# Patient Record
Sex: Male | Born: 1950
Health system: Southern US, Community
[De-identification: ages and names within clinical notes are randomized; demographics above are authoritative.]

## PROBLEM LIST (undated history)

## (undated) DIAGNOSIS — L8990 Pressure ulcer of unspecified site, unspecified stage: Secondary | ICD-10-CM

## (undated) DIAGNOSIS — E785 Hyperlipidemia, unspecified: Secondary | ICD-10-CM

## (undated) DIAGNOSIS — G8220 Paraplegia, unspecified: Secondary | ICD-10-CM

## (undated) DIAGNOSIS — T7840XA Allergy, unspecified, initial encounter: Secondary | ICD-10-CM

## (undated) DIAGNOSIS — L02215 Cutaneous abscess of perineum: Secondary | ICD-10-CM

## (undated) DIAGNOSIS — F1910 Other psychoactive substance abuse, uncomplicated: Secondary | ICD-10-CM

## (undated) DIAGNOSIS — K219 Gastro-esophageal reflux disease without esophagitis: Secondary | ICD-10-CM

## (undated) DIAGNOSIS — I10 Essential (primary) hypertension: Secondary | ICD-10-CM

## (undated) DIAGNOSIS — A0472 Enterocolitis due to Clostridium difficile, not specified as recurrent: Secondary | ICD-10-CM

## (undated) DIAGNOSIS — E1310 Other specified diabetes mellitus with ketoacidosis without coma: Secondary | ICD-10-CM

## (undated) DIAGNOSIS — M1990 Unspecified osteoarthritis, unspecified site: Secondary | ICD-10-CM

## (undated) HISTORY — PX: CATARACT EXTRACTION: SUR2

## (undated) HISTORY — DX: Pressure ulcer of unspecified site, unspecified stage: L89.90

## (undated) HISTORY — DX: Essential (primary) hypertension: I10

## (undated) HISTORY — DX: Enterocolitis due to Clostridium difficile, not specified as recurrent: A04.72

## (undated) HISTORY — PX: SPINE SURGERY: SHX786

## (undated) HISTORY — DX: Hyperlipidemia, unspecified: E78.5

## (undated) HISTORY — DX: Gastro-esophageal reflux disease without esophagitis: K21.9

## (undated) HISTORY — DX: Cutaneous abscess of perineum: L02.215

## (undated) HISTORY — DX: Paraplegia, unspecified: G82.20

## (undated) HISTORY — DX: Allergy, unspecified, initial encounter: T78.40XA

## (undated) HISTORY — PX: THROAT SURGERY: SHX803

## (undated) HISTORY — DX: Other psychoactive substance abuse, uncomplicated: F19.10

---
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
## 1997-12-09 DIAGNOSIS — L8990 Pressure ulcer of unspecified site, unspecified stage: Secondary | ICD-10-CM

## 1997-12-09 HISTORY — DX: Pressure ulcer of unspecified site, unspecified stage: L89.90

## 1998-03-15 ENCOUNTER — Emergency Department (HOSPITAL_COMMUNITY): Admission: EM | Admit: 1998-03-15 | Discharge: 1998-03-15 | Payer: Self-pay | Admitting: Emergency Medicine

## 1998-03-28 ENCOUNTER — Inpatient Hospital Stay (HOSPITAL_COMMUNITY): Admission: AD | Admit: 1998-03-28 | Discharge: 1998-04-04 | Payer: Self-pay | Admitting: Hematology and Oncology

## 1998-04-17 ENCOUNTER — Encounter: Admission: RE | Admit: 1998-04-17 | Discharge: 1998-04-17 | Payer: Self-pay | Admitting: Internal Medicine

## 1998-05-05 ENCOUNTER — Inpatient Hospital Stay (HOSPITAL_COMMUNITY): Admission: RE | Admit: 1998-05-05 | Discharge: 1998-05-26 | Payer: Self-pay | Admitting: Plastic Surgery

## 1998-06-19 ENCOUNTER — Encounter (HOSPITAL_COMMUNITY): Admission: RE | Admit: 1998-06-19 | Discharge: 1998-09-17 | Payer: Self-pay | Admitting: Plastic Surgery

## 1998-07-13 ENCOUNTER — Encounter: Admission: RE | Admit: 1998-07-13 | Discharge: 1998-07-13 | Payer: Self-pay | Admitting: Hematology and Oncology

## 1998-12-14 ENCOUNTER — Encounter: Admission: RE | Admit: 1998-12-14 | Discharge: 1998-12-14 | Payer: Self-pay | Admitting: Hematology and Oncology

## 1999-01-09 ENCOUNTER — Ambulatory Visit (HOSPITAL_COMMUNITY): Admission: RE | Admit: 1999-01-09 | Discharge: 1999-01-09 | Payer: Self-pay

## 1999-03-06 ENCOUNTER — Encounter: Admission: RE | Admit: 1999-03-06 | Discharge: 1999-03-06 | Payer: Self-pay | Admitting: Hematology and Oncology

## 1999-04-10 ENCOUNTER — Encounter: Admission: RE | Admit: 1999-04-10 | Discharge: 1999-04-10 | Payer: Self-pay | Admitting: Internal Medicine

## 1999-07-09 ENCOUNTER — Encounter: Admission: RE | Admit: 1999-07-09 | Discharge: 1999-07-09 | Payer: Self-pay | Admitting: Internal Medicine

## 1999-07-13 ENCOUNTER — Ambulatory Visit (HOSPITAL_COMMUNITY): Admission: RE | Admit: 1999-07-13 | Discharge: 1999-07-13 | Payer: Self-pay

## 2000-01-08 ENCOUNTER — Encounter: Admission: RE | Admit: 2000-01-08 | Discharge: 2000-01-08 | Payer: Self-pay | Admitting: Internal Medicine

## 2000-02-25 ENCOUNTER — Encounter: Admission: RE | Admit: 2000-02-25 | Discharge: 2000-02-25 | Payer: Self-pay | Admitting: Internal Medicine

## 2000-03-03 ENCOUNTER — Encounter: Payer: Self-pay | Admitting: Internal Medicine

## 2000-03-03 ENCOUNTER — Ambulatory Visit (HOSPITAL_COMMUNITY): Admission: RE | Admit: 2000-03-03 | Discharge: 2000-03-03 | Payer: Self-pay | Admitting: Internal Medicine

## 2000-03-03 ENCOUNTER — Encounter: Admission: RE | Admit: 2000-03-03 | Discharge: 2000-03-03 | Payer: Self-pay | Admitting: Internal Medicine

## 2000-04-14 ENCOUNTER — Encounter: Admission: RE | Admit: 2000-04-14 | Discharge: 2000-04-14 | Payer: Self-pay | Admitting: Internal Medicine

## 2000-06-30 ENCOUNTER — Encounter: Admission: RE | Admit: 2000-06-30 | Discharge: 2000-06-30 | Payer: Self-pay | Admitting: Internal Medicine

## 2000-12-09 HISTORY — PX: OTHER SURGICAL HISTORY: SHX169

## 2000-12-15 ENCOUNTER — Encounter: Admission: RE | Admit: 2000-12-15 | Discharge: 2000-12-15 | Payer: Self-pay

## 2001-01-05 ENCOUNTER — Encounter: Admission: RE | Admit: 2001-01-05 | Discharge: 2001-01-05 | Payer: Self-pay

## 2001-03-26 ENCOUNTER — Encounter: Admission: RE | Admit: 2001-03-26 | Discharge: 2001-03-26 | Payer: Self-pay

## 2001-05-05 ENCOUNTER — Encounter: Admission: RE | Admit: 2001-05-05 | Discharge: 2001-05-05 | Payer: Self-pay

## 2001-05-06 ENCOUNTER — Ambulatory Visit (HOSPITAL_COMMUNITY): Admission: RE | Admit: 2001-05-06 | Discharge: 2001-05-06 | Payer: Self-pay

## 2001-05-13 ENCOUNTER — Encounter: Admission: RE | Admit: 2001-05-13 | Discharge: 2001-05-13 | Payer: Self-pay | Admitting: Internal Medicine

## 2001-05-18 ENCOUNTER — Encounter: Admission: RE | Admit: 2001-05-18 | Discharge: 2001-05-18 | Payer: Self-pay | Admitting: Internal Medicine

## 2001-06-08 ENCOUNTER — Encounter: Admission: RE | Admit: 2001-06-08 | Discharge: 2001-06-08 | Payer: Self-pay | Admitting: Internal Medicine

## 2001-07-01 ENCOUNTER — Ambulatory Visit (HOSPITAL_COMMUNITY): Admission: RE | Admit: 2001-07-01 | Discharge: 2001-07-01 | Payer: Self-pay | Admitting: Plastic Surgery

## 2001-07-13 ENCOUNTER — Encounter: Admission: RE | Admit: 2001-07-13 | Discharge: 2001-07-13 | Payer: Self-pay | Admitting: Internal Medicine

## 2001-07-29 ENCOUNTER — Encounter: Payer: Self-pay | Admitting: Plastic Surgery

## 2001-07-31 ENCOUNTER — Encounter (INDEPENDENT_AMBULATORY_CARE_PROVIDER_SITE_OTHER): Payer: Self-pay | Admitting: *Deleted

## 2001-07-31 ENCOUNTER — Inpatient Hospital Stay (HOSPITAL_COMMUNITY): Admission: RE | Admit: 2001-07-31 | Discharge: 2001-08-21 | Payer: Self-pay | Admitting: Infectious Diseases

## 2001-09-03 ENCOUNTER — Encounter: Admission: RE | Admit: 2001-09-03 | Discharge: 2001-09-03 | Payer: Self-pay | Admitting: Internal Medicine

## 2001-12-21 ENCOUNTER — Encounter: Admission: RE | Admit: 2001-12-21 | Discharge: 2001-12-21 | Payer: Self-pay | Admitting: Internal Medicine

## 2001-12-29 ENCOUNTER — Encounter: Admission: RE | Admit: 2001-12-29 | Discharge: 2001-12-29 | Payer: Self-pay

## 2002-08-11 ENCOUNTER — Encounter: Admission: RE | Admit: 2002-08-11 | Discharge: 2002-08-11 | Payer: Self-pay | Admitting: Internal Medicine

## 2002-11-16 ENCOUNTER — Encounter: Admission: RE | Admit: 2002-11-16 | Discharge: 2002-11-16 | Payer: Self-pay | Admitting: Internal Medicine

## 2002-12-01 ENCOUNTER — Ambulatory Visit (HOSPITAL_COMMUNITY): Admission: RE | Admit: 2002-12-01 | Discharge: 2002-12-01 | Payer: Self-pay | Admitting: Internal Medicine

## 2002-12-01 ENCOUNTER — Encounter: Payer: Self-pay | Admitting: Cardiology

## 2002-12-21 ENCOUNTER — Encounter: Admission: RE | Admit: 2002-12-21 | Discharge: 2002-12-21 | Payer: Self-pay | Admitting: Internal Medicine

## 2003-01-27 ENCOUNTER — Encounter: Admission: RE | Admit: 2003-01-27 | Discharge: 2003-01-27 | Payer: Self-pay | Admitting: Internal Medicine

## 2003-01-31 ENCOUNTER — Encounter: Payer: Self-pay | Admitting: Internal Medicine

## 2003-01-31 ENCOUNTER — Ambulatory Visit (HOSPITAL_COMMUNITY): Admission: RE | Admit: 2003-01-31 | Discharge: 2003-01-31 | Payer: Self-pay | Admitting: Internal Medicine

## 2003-07-21 ENCOUNTER — Encounter: Admission: RE | Admit: 2003-07-21 | Discharge: 2003-07-21 | Payer: Self-pay | Admitting: Internal Medicine

## 2003-08-03 ENCOUNTER — Encounter: Admission: RE | Admit: 2003-08-03 | Discharge: 2003-08-03 | Payer: Self-pay | Admitting: Internal Medicine

## 2004-01-09 ENCOUNTER — Encounter: Admission: RE | Admit: 2004-01-09 | Discharge: 2004-01-09 | Payer: Self-pay | Admitting: Internal Medicine

## 2004-02-09 ENCOUNTER — Encounter: Admission: RE | Admit: 2004-02-09 | Discharge: 2004-02-09 | Payer: Self-pay | Admitting: Internal Medicine

## 2004-09-05 ENCOUNTER — Ambulatory Visit: Payer: Self-pay | Admitting: Internal Medicine

## 2004-10-14 ENCOUNTER — Emergency Department (HOSPITAL_COMMUNITY): Admission: EM | Admit: 2004-10-14 | Discharge: 2004-10-14 | Payer: Self-pay | Admitting: Emergency Medicine

## 2004-10-15 ENCOUNTER — Ambulatory Visit: Payer: Self-pay | Admitting: Internal Medicine

## 2004-11-27 ENCOUNTER — Ambulatory Visit: Payer: Self-pay | Admitting: Internal Medicine

## 2005-04-01 ENCOUNTER — Ambulatory Visit: Payer: Self-pay | Admitting: Internal Medicine

## 2005-09-10 ENCOUNTER — Ambulatory Visit: Payer: Self-pay | Admitting: Hospitalist

## 2005-10-01 ENCOUNTER — Ambulatory Visit: Payer: Self-pay | Admitting: Internal Medicine

## 2005-10-07 ENCOUNTER — Encounter
Admission: RE | Admit: 2005-10-07 | Discharge: 2006-01-05 | Payer: Self-pay | Admitting: Physical Medicine & Rehabilitation

## 2005-10-07 ENCOUNTER — Ambulatory Visit: Payer: Self-pay | Admitting: Physical Medicine & Rehabilitation

## 2005-10-29 ENCOUNTER — Ambulatory Visit: Payer: Self-pay | Admitting: Internal Medicine

## 2005-11-03 ENCOUNTER — Ambulatory Visit (HOSPITAL_COMMUNITY): Admission: RE | Admit: 2005-11-03 | Discharge: 2005-11-03 | Payer: Self-pay | Admitting: Internal Medicine

## 2005-11-11 ENCOUNTER — Encounter: Admission: RE | Admit: 2005-11-11 | Discharge: 2006-02-09 | Payer: Self-pay | Admitting: Internal Medicine

## 2005-11-13 ENCOUNTER — Ambulatory Visit: Payer: Self-pay | Admitting: Internal Medicine

## 2006-01-07 ENCOUNTER — Ambulatory Visit: Payer: Self-pay | Admitting: Hospitalist

## 2006-01-07 ENCOUNTER — Ambulatory Visit (HOSPITAL_COMMUNITY): Admission: RE | Admit: 2006-01-07 | Discharge: 2006-01-07 | Payer: Self-pay | Admitting: Hospitalist

## 2006-02-28 ENCOUNTER — Ambulatory Visit: Payer: Self-pay | Admitting: Physical Medicine & Rehabilitation

## 2006-02-28 ENCOUNTER — Inpatient Hospital Stay (HOSPITAL_COMMUNITY): Admission: RE | Admit: 2006-02-28 | Discharge: 2006-03-09 | Payer: Self-pay | Admitting: Neurosurgery

## 2006-03-01 ENCOUNTER — Ambulatory Visit: Payer: Self-pay | Admitting: Critical Care Medicine

## 2006-10-15 ENCOUNTER — Emergency Department (HOSPITAL_COMMUNITY): Admission: EM | Admit: 2006-10-15 | Discharge: 2006-10-15 | Payer: Self-pay | Admitting: Emergency Medicine

## 2006-12-29 ENCOUNTER — Ambulatory Visit: Payer: Self-pay | Admitting: Internal Medicine

## 2006-12-29 DIAGNOSIS — G8220 Paraplegia, unspecified: Secondary | ICD-10-CM

## 2006-12-29 DIAGNOSIS — I10 Essential (primary) hypertension: Secondary | ICD-10-CM

## 2006-12-29 DIAGNOSIS — E785 Hyperlipidemia, unspecified: Secondary | ICD-10-CM

## 2006-12-29 DIAGNOSIS — K219 Gastro-esophageal reflux disease without esophagitis: Secondary | ICD-10-CM | POA: Insufficient documentation

## 2006-12-29 DIAGNOSIS — J309 Allergic rhinitis, unspecified: Secondary | ICD-10-CM | POA: Insufficient documentation

## 2006-12-29 DIAGNOSIS — G4700 Insomnia, unspecified: Secondary | ICD-10-CM

## 2007-01-08 ENCOUNTER — Encounter: Payer: Self-pay | Admitting: Internal Medicine

## 2007-01-08 ENCOUNTER — Ambulatory Visit: Payer: Self-pay | Admitting: Internal Medicine

## 2007-02-16 LAB — CONVERTED CEMR LAB
Albumin: 4.1 g/dL (ref 3.5–5.2)
BUN: 17 mg/dL (ref 6–23)
Calcium: 9.3 mg/dL (ref 8.4–10.5)
Chloride: 99 meq/L (ref 96–112)
Creatinine, Ser: 0.84 mg/dL (ref 0.40–1.50)
Glucose, Bld: 92 mg/dL (ref 70–99)
HCT: 40.1 % (ref 39.0–52.0)
HDL: 24 mg/dL — ABNORMAL LOW (ref >39)
Hemoglobin: 12.5 g/dL — ABNORMAL LOW (ref 13.0–17.0)
MCHC: 31.2 g/dL (ref 30.0–36.0)
Potassium: 5.3 meq/L (ref 3.5–5.3)
RBC: 4.6 M/uL (ref 4.22–5.81)
Total CHOL/HDL Ratio: 5.9
Triglycerides: 242 mg/dL — ABNORMAL HIGH (ref <150)

## 2007-06-02 ENCOUNTER — Ambulatory Visit: Payer: Self-pay | Admitting: Internal Medicine

## 2007-06-02 DIAGNOSIS — K644 Residual hemorrhoidal skin tags: Secondary | ICD-10-CM | POA: Insufficient documentation

## 2007-06-17 ENCOUNTER — Ambulatory Visit: Payer: Self-pay | Admitting: Gastroenterology

## 2007-07-02 ENCOUNTER — Inpatient Hospital Stay (HOSPITAL_COMMUNITY): Admission: AD | Admit: 2007-07-02 | Discharge: 2007-07-03 | Payer: Self-pay | Admitting: Obstetrics & Gynecology

## 2007-07-03 ENCOUNTER — Encounter: Payer: Self-pay | Admitting: Gastroenterology

## 2007-07-10 ENCOUNTER — Ambulatory Visit: Payer: Self-pay | Admitting: Gastroenterology

## 2007-08-13 ENCOUNTER — Telehealth: Payer: Self-pay | Admitting: Internal Medicine

## 2007-11-08 ENCOUNTER — Inpatient Hospital Stay (HOSPITAL_COMMUNITY): Admission: EM | Admit: 2007-11-08 | Discharge: 2007-11-16 | Payer: Self-pay | Admitting: *Deleted

## 2007-11-08 ENCOUNTER — Ambulatory Visit: Payer: Self-pay | Admitting: Internal Medicine

## 2007-11-09 HISTORY — PX: BONE BIOPSY: SHX375

## 2007-12-01 ENCOUNTER — Ambulatory Visit: Payer: Self-pay | Admitting: Internal Medicine

## 2007-12-01 ENCOUNTER — Encounter (INDEPENDENT_AMBULATORY_CARE_PROVIDER_SITE_OTHER): Payer: Self-pay | Admitting: Infectious Diseases

## 2007-12-01 LAB — CONVERTED CEMR LAB
BUN: 15 mg/dL (ref 6–23)
CO2: 21 meq/L (ref 19–32)
Calcium: 9.2 mg/dL (ref 8.4–10.5)
Creatinine, Ser: 0.74 mg/dL (ref 0.40–1.50)
Glucose, Bld: 107 mg/dL — ABNORMAL HIGH (ref 70–99)
Hemoglobin, Urine: NEGATIVE
Ketones, ur: NEGATIVE mg/dL
Nitrite: NEGATIVE
Protein, ur: NEGATIVE mg/dL
Urobilinogen, UA: 0.2 (ref 0.0–1.0)

## 2007-12-13 ENCOUNTER — Ambulatory Visit: Payer: Self-pay | Admitting: Hospitalist

## 2007-12-13 ENCOUNTER — Inpatient Hospital Stay (HOSPITAL_COMMUNITY): Admission: EM | Admit: 2007-12-13 | Discharge: 2007-12-18 | Payer: Self-pay | Admitting: Emergency Medicine

## 2007-12-18 ENCOUNTER — Encounter: Payer: Self-pay | Admitting: Internal Medicine

## 2007-12-18 ENCOUNTER — Ambulatory Visit: Payer: Self-pay | Admitting: Internal Medicine

## 2008-01-19 ENCOUNTER — Ambulatory Visit: Payer: Self-pay | Admitting: Hospitalist

## 2008-01-25 ENCOUNTER — Telehealth: Payer: Self-pay | Admitting: Internal Medicine

## 2008-01-27 ENCOUNTER — Ambulatory Visit: Payer: Self-pay | Admitting: Internal Medicine

## 2008-01-28 ENCOUNTER — Encounter: Payer: Self-pay | Admitting: Internal Medicine

## 2008-02-03 ENCOUNTER — Telehealth: Payer: Self-pay | Admitting: Internal Medicine

## 2008-02-04 ENCOUNTER — Encounter: Payer: Self-pay | Admitting: Internal Medicine

## 2008-02-11 ENCOUNTER — Encounter: Payer: Self-pay | Admitting: Internal Medicine

## 2008-03-01 ENCOUNTER — Telehealth: Payer: Self-pay | Admitting: Internal Medicine

## 2008-03-10 ENCOUNTER — Telehealth (INDEPENDENT_AMBULATORY_CARE_PROVIDER_SITE_OTHER): Payer: Self-pay | Admitting: *Deleted

## 2008-03-11 ENCOUNTER — Ambulatory Visit: Payer: Self-pay | Admitting: Hospitalist

## 2008-03-11 ENCOUNTER — Encounter: Payer: Self-pay | Admitting: Internal Medicine

## 2008-03-11 LAB — CONVERTED CEMR LAB
ALT: <8 units/L (ref 0–53)
AST: 7 units/L (ref 0–37)
Basophils Absolute: 0 10*3/uL (ref 0.0–0.1)
Basophils Relative: 0 % (ref 0–1)
Calcium: 9.5 mg/dL (ref 8.4–10.5)
Chloride: 102 meq/L (ref 96–112)
Creatinine, Ser: 0.72 mg/dL (ref 0.40–1.50)
Hemoglobin: 12 g/dL — ABNORMAL LOW (ref 13.0–17.0)
MCHC: 34.1 g/dL (ref 30.0–36.0)
Monocytes Absolute: 0.8 10*3/uL (ref 0.1–1.0)
Neutro Abs: 7.3 10*3/uL (ref 1.7–7.7)
Neutrophils Relative %: 75 % (ref 43–77)
Platelets: 455 10*3/uL — ABNORMAL HIGH (ref 150–400)
RDW: 16.8 % — ABNORMAL HIGH (ref 11.5–15.5)
Total Bilirubin: 0.3 mg/dL (ref 0.3–1.2)

## 2008-04-08 ENCOUNTER — Telehealth: Payer: Self-pay | Admitting: Internal Medicine

## 2008-04-14 ENCOUNTER — Telehealth: Payer: Self-pay | Admitting: Internal Medicine

## 2008-04-18 ENCOUNTER — Ambulatory Visit: Payer: Self-pay | Admitting: *Deleted

## 2008-04-18 ENCOUNTER — Encounter (INDEPENDENT_AMBULATORY_CARE_PROVIDER_SITE_OTHER): Payer: Self-pay | Admitting: Internal Medicine

## 2008-04-19 ENCOUNTER — Telehealth: Payer: Self-pay | Admitting: Internal Medicine

## 2008-04-19 LAB — CONVERTED CEMR LAB
Basophils Absolute: 0 10*3/uL (ref 0.0–0.1)
Basophils Relative: 0 % (ref 0–1)
Calcium: 9.2 mg/dL (ref 8.4–10.5)
Eosinophils Absolute: 0.1 10*3/uL (ref 0.0–0.7)
Eosinophils Relative: 1 % (ref 0–5)
HCT: 31.3 % — ABNORMAL LOW (ref 39.0–52.0)
MCV: 83.4 fL (ref 78.0–100.0)
Nitrite: POSITIVE — AB
Platelets: 573 10*3/uL — ABNORMAL HIGH (ref 150–400)
RDW: 15.8 % — ABNORMAL HIGH (ref 11.5–15.5)
Sodium: 134 meq/L — ABNORMAL LOW (ref 135–145)
Specific Gravity, Urine: 1.015 (ref 1.005–1.03)
pH: 5.5 (ref 5.0–8.0)

## 2008-04-22 ENCOUNTER — Ambulatory Visit: Payer: Self-pay | Admitting: Internal Medicine

## 2008-04-22 ENCOUNTER — Encounter (INDEPENDENT_AMBULATORY_CARE_PROVIDER_SITE_OTHER): Payer: Self-pay | Admitting: Internal Medicine

## 2008-04-24 LAB — CONVERTED CEMR LAB
Basophils Absolute: 0 10*3/uL (ref 0.0–0.1)
Basophils Relative: 0 % (ref 0–1)
Eosinophils Absolute: 0.1 10*3/uL (ref 0.0–0.7)
Hemoglobin: 10.6 g/dL — ABNORMAL LOW (ref 13.0–17.0)
MCHC: 32.9 g/dL (ref 30.0–36.0)
Monocytes Absolute: 1.2 10*3/uL — ABNORMAL HIGH (ref 0.1–1.0)
Neutro Abs: 9.9 10*3/uL — ABNORMAL HIGH (ref 1.7–7.7)
Neutrophils Relative %: 76 % (ref 43–77)
RDW: 16 % — ABNORMAL HIGH (ref 11.5–15.5)

## 2008-06-17 ENCOUNTER — Encounter: Payer: Self-pay | Admitting: Internal Medicine

## 2008-06-24 ENCOUNTER — Ambulatory Visit: Payer: Self-pay | Admitting: *Deleted

## 2008-07-13 ENCOUNTER — Telehealth: Payer: Self-pay | Admitting: Internal Medicine

## 2008-07-16 ENCOUNTER — Encounter: Payer: Self-pay | Admitting: Internal Medicine

## 2008-07-27 ENCOUNTER — Ambulatory Visit: Payer: Self-pay | Admitting: Internal Medicine

## 2008-07-27 ENCOUNTER — Encounter: Payer: Self-pay | Admitting: Internal Medicine

## 2008-07-27 LAB — CONVERTED CEMR LAB
ALT: <8 units/L (ref 0–53)
AST: 7 units/L (ref 0–37)
Albumin: 3.1 g/dL — ABNORMAL LOW (ref 3.5–5.2)
Basophils Absolute: 0 10*3/uL (ref 0.0–0.1)
CO2: 23 meq/L (ref 19–32)
Calcium: 8.7 mg/dL (ref 8.4–10.5)
Chloride: 103 meq/L (ref 96–112)
Cholesterol: 109 mg/dL (ref 0–200)
Hemoglobin: 9.6 g/dL — ABNORMAL LOW (ref 13.0–17.0)
Lymphocytes Relative: 18 % (ref 12–46)
Monocytes Absolute: 0.7 10*3/uL (ref 0.1–1.0)
Neutro Abs: 6 10*3/uL (ref 1.7–7.7)
Neutrophils Relative %: 72 % (ref 43–77)
Nitrite: POSITIVE — AB
PSA: 0.61 ng/mL (ref 0.10–4.00)
Potassium: 4.3 meq/L (ref 3.5–5.3)
RBC / HPF: NONE SEEN (ref <3)
RDW: 17.1 % — ABNORMAL HIGH (ref 11.5–15.5)
Sodium: 137 meq/L (ref 135–145)
Total Protein: 6.1 g/dL (ref 6.0–8.3)
Urine Glucose: NEGATIVE mg/dL
pH: 6.5 (ref 5.0–8.0)

## 2008-07-28 ENCOUNTER — Encounter: Payer: Self-pay | Admitting: Internal Medicine

## 2008-08-17 ENCOUNTER — Encounter: Payer: Self-pay | Admitting: Internal Medicine

## 2008-08-23 ENCOUNTER — Telehealth: Payer: Self-pay | Admitting: *Deleted

## 2008-08-29 ENCOUNTER — Encounter: Payer: Self-pay | Admitting: Internal Medicine

## 2008-08-29 ENCOUNTER — Telehealth: Payer: Self-pay | Admitting: Internal Medicine

## 2008-09-26 ENCOUNTER — Ambulatory Visit: Payer: Self-pay | Admitting: Internal Medicine

## 2008-09-26 DIAGNOSIS — M7150 Other bursitis, not elsewhere classified, unspecified site: Secondary | ICD-10-CM

## 2008-10-04 ENCOUNTER — Encounter: Payer: Self-pay | Admitting: Internal Medicine

## 2008-10-18 ENCOUNTER — Ambulatory Visit: Payer: Self-pay | Admitting: Sports Medicine

## 2008-10-31 ENCOUNTER — Inpatient Hospital Stay (HOSPITAL_COMMUNITY): Admission: RE | Admit: 2008-10-31 | Discharge: 2008-11-21 | Payer: Self-pay | Admitting: Plastic Surgery

## 2008-10-31 ENCOUNTER — Ambulatory Visit: Payer: Self-pay | Admitting: Internal Medicine

## 2008-10-31 ENCOUNTER — Encounter (INDEPENDENT_AMBULATORY_CARE_PROVIDER_SITE_OTHER): Payer: Self-pay | Admitting: Plastic Surgery

## 2008-10-31 ENCOUNTER — Encounter: Payer: Self-pay | Admitting: Internal Medicine

## 2008-10-31 ENCOUNTER — Other Ambulatory Visit: Payer: Self-pay | Admitting: Internal Medicine

## 2008-11-08 DIAGNOSIS — A0472 Enterocolitis due to Clostridium difficile, not specified as recurrent: Secondary | ICD-10-CM

## 2008-11-08 HISTORY — DX: Enterocolitis due to Clostridium difficile, not specified as recurrent: A04.72

## 2008-11-22 ENCOUNTER — Encounter: Payer: Self-pay | Admitting: Internal Medicine

## 2008-11-27 ENCOUNTER — Encounter (INDEPENDENT_AMBULATORY_CARE_PROVIDER_SITE_OTHER): Payer: Self-pay | Admitting: *Deleted

## 2008-12-03 ENCOUNTER — Telehealth (INDEPENDENT_AMBULATORY_CARE_PROVIDER_SITE_OTHER): Payer: Self-pay | Admitting: Internal Medicine

## 2008-12-04 ENCOUNTER — Inpatient Hospital Stay (HOSPITAL_COMMUNITY): Admission: EM | Admit: 2008-12-04 | Discharge: 2008-12-07 | Payer: Self-pay | Admitting: Emergency Medicine

## 2008-12-07 ENCOUNTER — Encounter: Payer: Self-pay | Admitting: Internal Medicine

## 2008-12-12 ENCOUNTER — Telehealth: Payer: Self-pay | Admitting: *Deleted

## 2008-12-12 ENCOUNTER — Encounter: Payer: Self-pay | Admitting: Internal Medicine

## 2008-12-12 ENCOUNTER — Ambulatory Visit: Payer: Self-pay | Admitting: Internal Medicine

## 2008-12-12 DIAGNOSIS — D649 Anemia, unspecified: Secondary | ICD-10-CM

## 2008-12-12 LAB — CONVERTED CEMR LAB
BUN: 10 mg/dL (ref 6–23)
Chloride: 105 meq/L (ref 96–112)
Creatinine, Ser: 0.45 mg/dL (ref 0.40–1.50)
HCT: 35.1 % — ABNORMAL LOW (ref 39.0–52.0)
Hemoglobin: 11.7 g/dL — ABNORMAL LOW (ref 13.0–17.0)
MCHC: 33.4 g/dL (ref 30.0–36.0)
Magnesium: 1.9 mg/dL (ref 1.5–2.5)
Platelets: 370 10*3/uL (ref 150–400)
Potassium: 4 meq/L (ref 3.5–5.3)
RDW: 24.2 % — ABNORMAL HIGH (ref 11.5–15.5)

## 2008-12-21 ENCOUNTER — Telehealth: Payer: Self-pay | Admitting: Infectious Disease

## 2008-12-22 ENCOUNTER — Ambulatory Visit: Payer: Self-pay | Admitting: Internal Medicine

## 2008-12-22 ENCOUNTER — Inpatient Hospital Stay (HOSPITAL_COMMUNITY): Admission: AD | Admit: 2008-12-22 | Discharge: 2008-12-25 | Payer: Self-pay | Admitting: Internal Medicine

## 2008-12-22 ENCOUNTER — Encounter (INDEPENDENT_AMBULATORY_CARE_PROVIDER_SITE_OTHER): Payer: Self-pay | Admitting: Internal Medicine

## 2008-12-22 LAB — CONVERTED CEMR LAB
Albumin: 3 g/dL — ABNORMAL LOW (ref 3.5–5.2)
Alkaline Phosphatase: 74 units/L (ref 39–117)
BUN: 3 mg/dL — ABNORMAL LOW (ref 6–23)
CO2: 23 meq/L (ref 19–32)
Calcium: 9.1 mg/dL (ref 8.4–10.5)
Chloride: 102 meq/L (ref 96–112)
Glucose, Bld: 128 mg/dL — ABNORMAL HIGH (ref 70–99)
Lymphocytes Relative: 5 % — ABNORMAL LOW (ref 12–46)
Lymphs Abs: 0.9 10*3/uL (ref 0.7–4.0)
MCV: 84.6 fL (ref 78.0–100.0)
Magnesium: 1.6 mg/dL (ref 1.5–2.5)
Monocytes Relative: 6 % (ref 3–12)
Neutrophils Relative %: 89 % — ABNORMAL HIGH (ref 43–77)
Platelets: 330 10*3/uL (ref 150–400)
Potassium: 3 meq/L — ABNORMAL LOW (ref 3.5–5.3)
RBC: 4.95 M/uL (ref 4.22–5.81)
Sodium: 140 meq/L (ref 135–145)
Total Protein: 6.2 g/dL (ref 6.0–8.3)
WBC: 20.7 10*3/uL — ABNORMAL HIGH (ref 4.0–10.5)

## 2008-12-23 ENCOUNTER — Ambulatory Visit: Payer: Self-pay | Admitting: Internal Medicine

## 2008-12-24 ENCOUNTER — Encounter (INDEPENDENT_AMBULATORY_CARE_PROVIDER_SITE_OTHER): Payer: Self-pay | Admitting: Internal Medicine

## 2008-12-26 ENCOUNTER — Encounter: Payer: Self-pay | Admitting: Internal Medicine

## 2009-01-06 ENCOUNTER — Encounter: Payer: Self-pay | Admitting: Internal Medicine

## 2009-01-12 ENCOUNTER — Ambulatory Visit: Payer: Self-pay | Admitting: Internal Medicine

## 2009-01-12 ENCOUNTER — Encounter (INDEPENDENT_AMBULATORY_CARE_PROVIDER_SITE_OTHER): Payer: Self-pay | Admitting: *Deleted

## 2009-01-12 LAB — CONVERTED CEMR LAB
Calcium: 9.3 mg/dL (ref 8.4–10.5)
Glucose, Bld: 95 mg/dL (ref 70–99)
Potassium: 4.3 meq/L (ref 3.5–5.3)
Sodium: 139 meq/L (ref 135–145)

## 2009-01-25 ENCOUNTER — Encounter: Payer: Self-pay | Admitting: Internal Medicine

## 2009-02-01 ENCOUNTER — Telehealth: Payer: Self-pay | Admitting: Internal Medicine

## 2009-02-08 ENCOUNTER — Encounter: Payer: Self-pay | Admitting: Internal Medicine

## 2009-02-13 ENCOUNTER — Ambulatory Visit: Payer: Self-pay | Admitting: *Deleted

## 2009-02-14 ENCOUNTER — Telehealth: Payer: Self-pay | Admitting: Internal Medicine

## 2009-02-17 ENCOUNTER — Ambulatory Visit: Payer: Self-pay | Admitting: Family Medicine

## 2009-02-17 DIAGNOSIS — M719 Bursopathy, unspecified: Secondary | ICD-10-CM

## 2009-02-17 DIAGNOSIS — M67919 Unspecified disorder of synovium and tendon, unspecified shoulder: Secondary | ICD-10-CM | POA: Insufficient documentation

## 2009-03-01 ENCOUNTER — Telehealth: Payer: Self-pay | Admitting: Internal Medicine

## 2009-03-14 ENCOUNTER — Telehealth: Payer: Self-pay | Admitting: Internal Medicine

## 2009-03-16 ENCOUNTER — Encounter: Payer: Self-pay | Admitting: Internal Medicine

## 2009-04-10 ENCOUNTER — Telehealth: Payer: Self-pay | Admitting: Internal Medicine

## 2009-04-12 ENCOUNTER — Telehealth: Payer: Self-pay | Admitting: Internal Medicine

## 2009-04-13 ENCOUNTER — Encounter: Payer: Self-pay | Admitting: Internal Medicine

## 2009-05-16 ENCOUNTER — Encounter (INDEPENDENT_AMBULATORY_CARE_PROVIDER_SITE_OTHER): Payer: Self-pay | Admitting: Internal Medicine

## 2009-05-16 ENCOUNTER — Telehealth: Payer: Self-pay | Admitting: Internal Medicine

## 2009-05-24 ENCOUNTER — Ambulatory Visit: Payer: Self-pay | Admitting: Internal Medicine

## 2009-07-03 ENCOUNTER — Telehealth: Payer: Self-pay | Admitting: Internal Medicine

## 2009-08-03 ENCOUNTER — Encounter: Payer: Self-pay | Admitting: Internal Medicine

## 2009-08-09 ENCOUNTER — Encounter: Payer: Self-pay | Admitting: Internal Medicine

## 2009-09-04 ENCOUNTER — Encounter: Payer: Self-pay | Admitting: Infectious Diseases

## 2009-09-04 ENCOUNTER — Telehealth (INDEPENDENT_AMBULATORY_CARE_PROVIDER_SITE_OTHER): Payer: Self-pay | Admitting: *Deleted

## 2009-09-25 IMAGING — CT CT ABDOMEN W/ CM
2 of 5 series · 11 of 32 positions shown, 16 images · IV contrast (100 ML OMNI 300)
Comparison: 11/10/2008; 11/07/2008

CT ABDOMEN

CLINICAL DATA: Revision flap over the shield decubitus ulcer.
Paraplegia.  Abnormal KUB with possible free air.

CT ABDOMEN AND PELVIS WITH CONTRAST
TECHNIQUE: Multidetector CT imaging of the abdomen and pelvis was
performed using the standard protocol following bolus
administration of intravenous contrast.
Contrast: 100 ml [id number]

[Series 2: routine abdomen · axial · 0.82mm/px · z∈[-418,-93]mm · 6 of 93 slices shown, 11 images]
[im 14/93  soft-tissue]
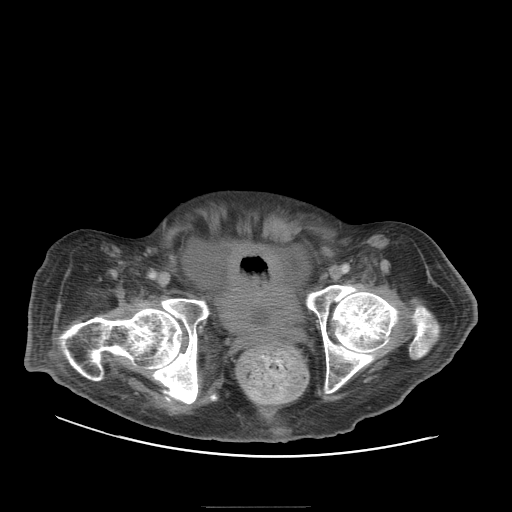
[im 14/93  bone]
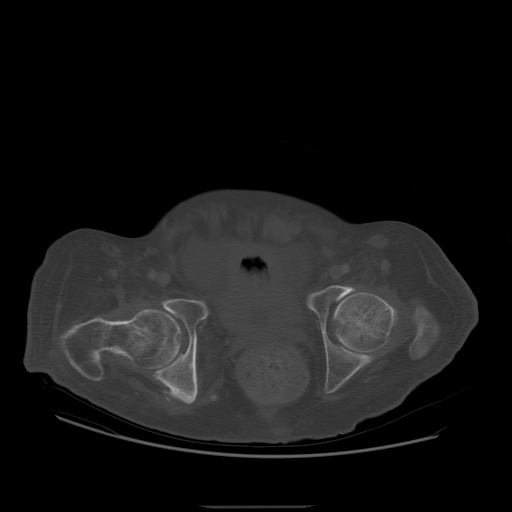
[im 27/93  soft-tissue]
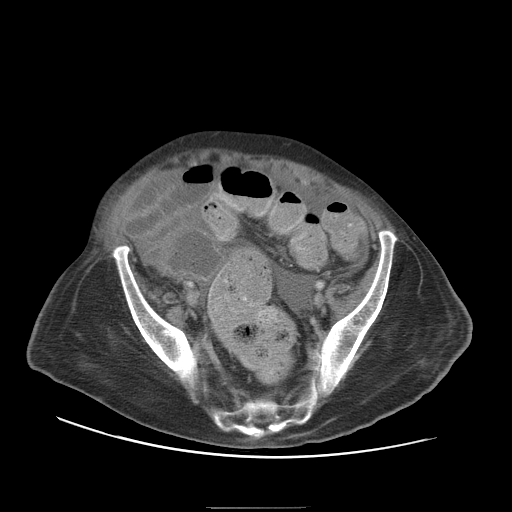
[im 40/93  soft-tissue]
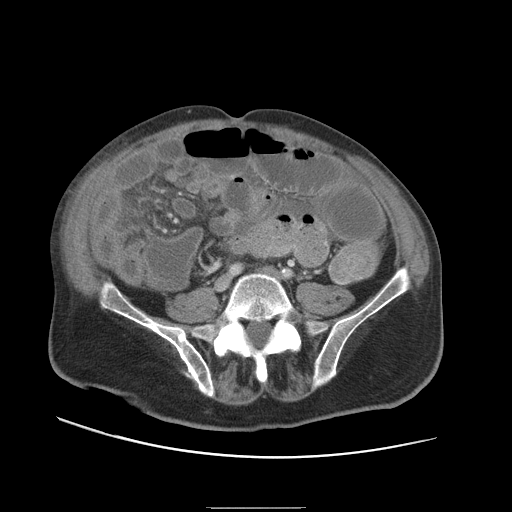
[im 40/93  lung]
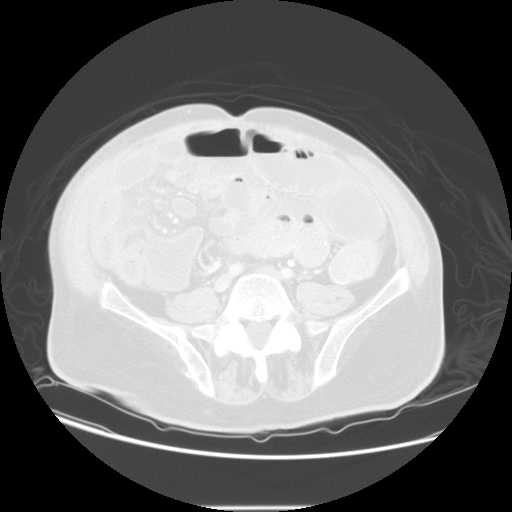
[im 53/93  soft-tissue]
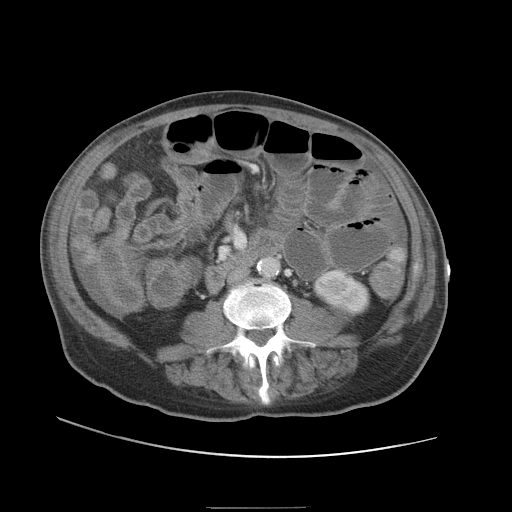
[im 53/93  lung]
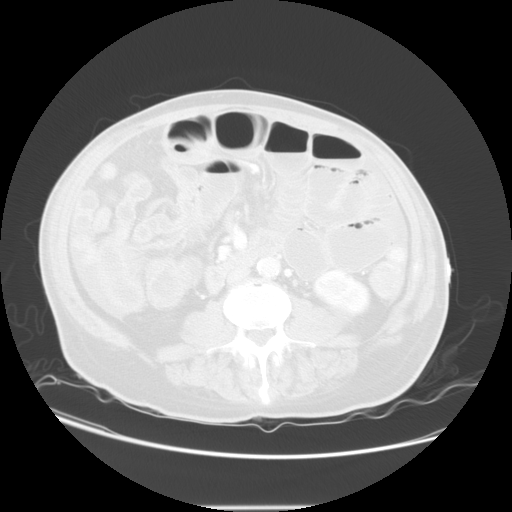
[im 66/93  soft-tissue]
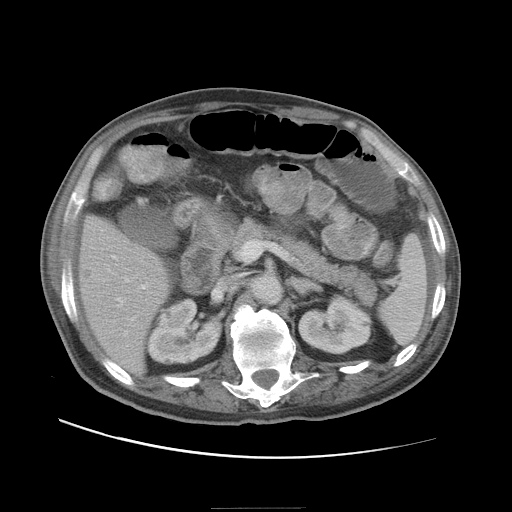
[im 66/93  lung]
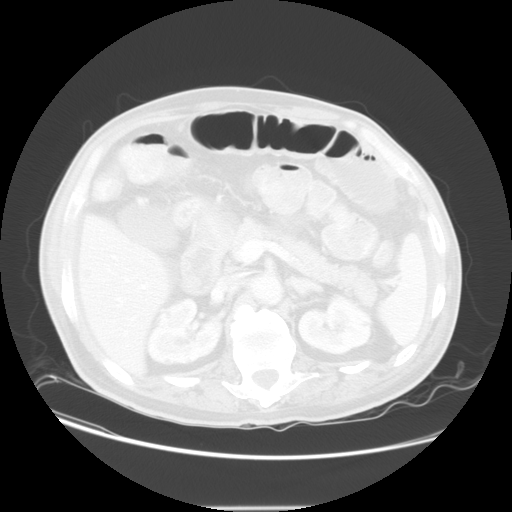
[im 79/93  soft-tissue]
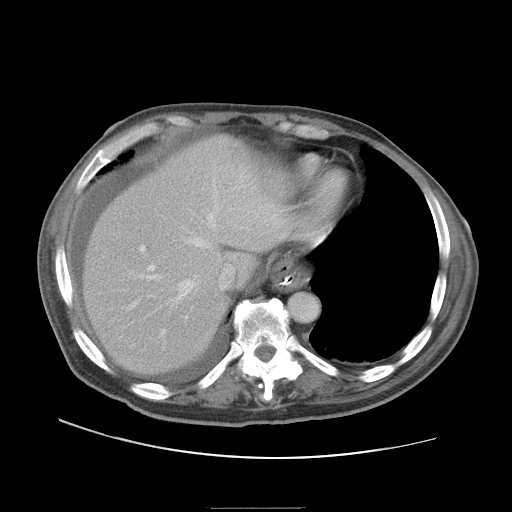
[im 79/93  lung]
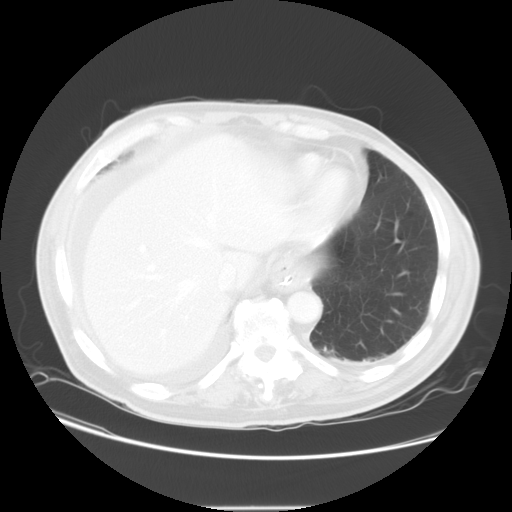

[Series 400: reformatted · sagittal · 0.92mm/px · 5 of 115 slices shown]
[im 12/115  soft-tissue]
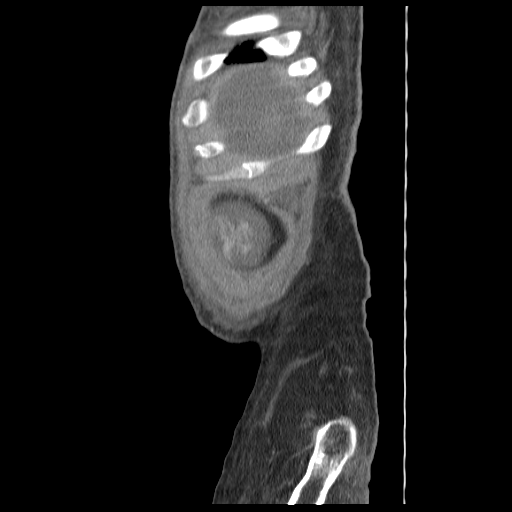
[im 23/115  soft-tissue]
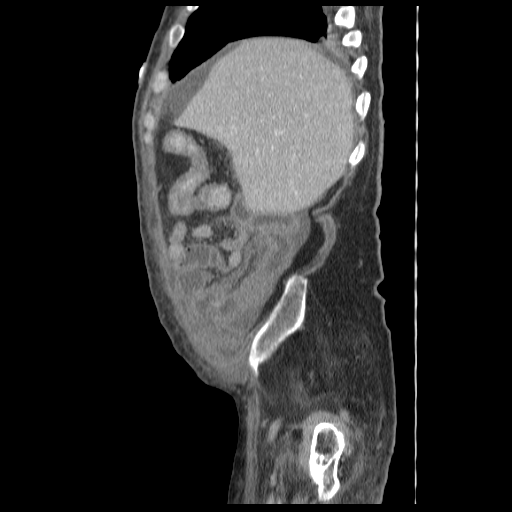
[im 35/115  soft-tissue]
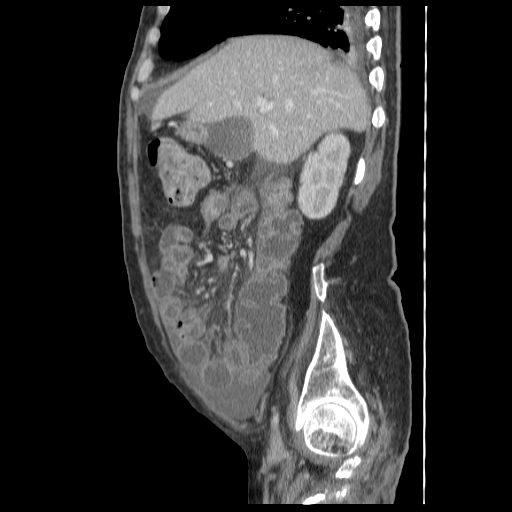
[im 46/115  soft-tissue]
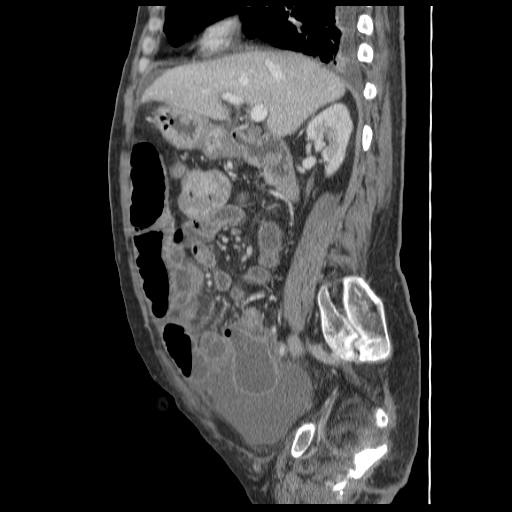
[im 69/115  soft-tissue]
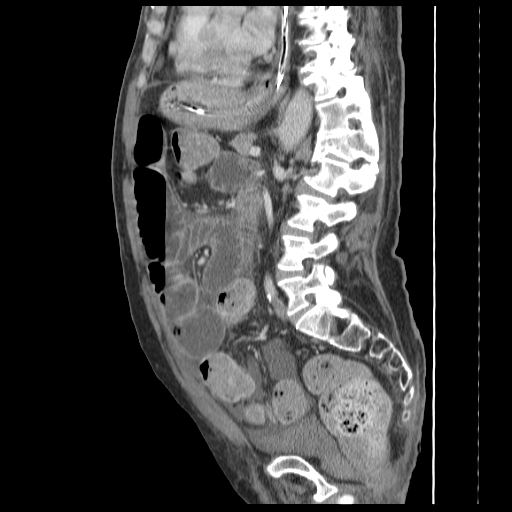

[11 of 32 positions shown; findings below may reference images not displayed]

FINDINGS: A small right pleural effusion is slightly larger than
on the prior CT scan.  There is associated passive atelectasis in
the right lower lobe.

A stable 8 mm hypodense lesion in the right hepatic lobe on image
29 of series 2 likely represents a cyst.

The spleen, pancreas, and gallbladder appear unremarkable.  Minimal
fullness of the adrenal glands is noted without an adrenal mass.
Bilateral hypodense renal lesions are present.  Most of these are
too small to satisfactorily characterize, although the larger
lesions are more clearly simple cysts.

Mildly dilated loops of small bowel in the left abdomen are noted,
measuring up to 4.3 cm in diameter.  No free intraperitoneal gas is
noted.  Perihepatic ascites is similar to the prior exam.

There is in nondilated loops of right-sided small bowel which once
again demonstrate wall thickening and some degree of mucosal
enhancement compatible with small bowel inflammation.

No pathologic retroperitoneal adenopathy is identified.

There appears to be a transition from the dilated bowel to the
inflamed/thick-walled small bowel extending from the central
abdomen to the right abdomen on image 40 of series 2.  The bunching
of thick-walled small bowel in the right pericolic gutter and the
appearance of transition point between dilated and thick-walled
small bowel in addition to the configuration of the mesentery
raises suspicion for the possibility of pericecal internal hernia
or potentially transmesenteric hernia. Moreover, on the sagittally
reconstructed multiplanar images, particularly images 29 through 51
of series 400, there appears to be some swirling of the mesenteric
vasculature extending into the right abdomen, potentially
representing some twisting around a vascular pedicle.

The proximal portions of the celiac trunk, superior mesenteric
artery, and inferior mesenteric artery appear patent.

Thoracolumbar kyphosis and deformity noted.
IMPRESSION: 1.  Moderately dilated proximal small bowel transitioning into a
thick-walled grouping of loops of small bowel in the right
pericolic gutter region with localized ascites and also with
perihepatic and perisplenic ascites.  There is only minimal
atherosclerosis and the proximal celiac trunk and mesenteric
vessels appear patent.  The appearance does raise the possibility
of an internal hernia such as pericecal hernia or transmesenteric
hernia as a cause for the localized bowel loop inflammation and
suspected partial small-bowel obstruction.  Sagittal images also
raise the possibility of some swirling of mesenteric vasculature
extending to these loops, suspicious for mild twisting rounded
vascular pedicle.  Correlation with physical exam findings and
clinical presentation is recommended [REDACTED]iding between careful
observation and surgical exploration.
2.  The small right pleural effusion is slightly larger than on the
prior exam.
3.  Scattered renal cysts and a single small hepatic cyst.
4.  Thoracolumbar kyphosis with a chronically subluxed compression
at the thoracolumbar junction.

CT PELVIS
FINDINGS: Some of the thick-walled loops of small bowel in the
right pericolic gutter region extend down into the pelvis adjacent
to the cecum, and several dilated loops of more proximal small
bowel extend down into the pelvis.  There is pelvic ascites and no
longer demonstrate the sedimentation level shown on the prior exam.

There is evidence of chronic osteomyelitis of the right ischium
tuberosity with some deformity of the left ischium tuberosity and
right-sided decubitus ulcer.  We partially imaged a drain adjacent
to the soft tissue calcifications on the right side.

A Foley catheter is present in the urinary bladder along with some
gas in the urinary bladder.
IMPRESSION: 1.  Some of the thick-walled and some of the dilated loops of small
bowel extend down into the pelvis.
2.  Stable findings of chronic osteomyelitis of the lower pelvis.
3.  Please see report for CT the abdomen above.

Critical test results telephoned to Dr. John Paul Ferreras at the time of
interpretation on 11/10/2008 at [DATE] p.m.

## 2009-09-26 IMAGING — CR DG ABDOMEN 2V
2 series · 2 of 2 positions shown · non-contrast
Comparison: Abdominal pelvic CT and radiographs 11/10/2008

CLINICAL DATA: Abdominal pain.  Follow-up bowel obstruction.

ABDOMEN - 2 VIEW

[w abdomen decub]
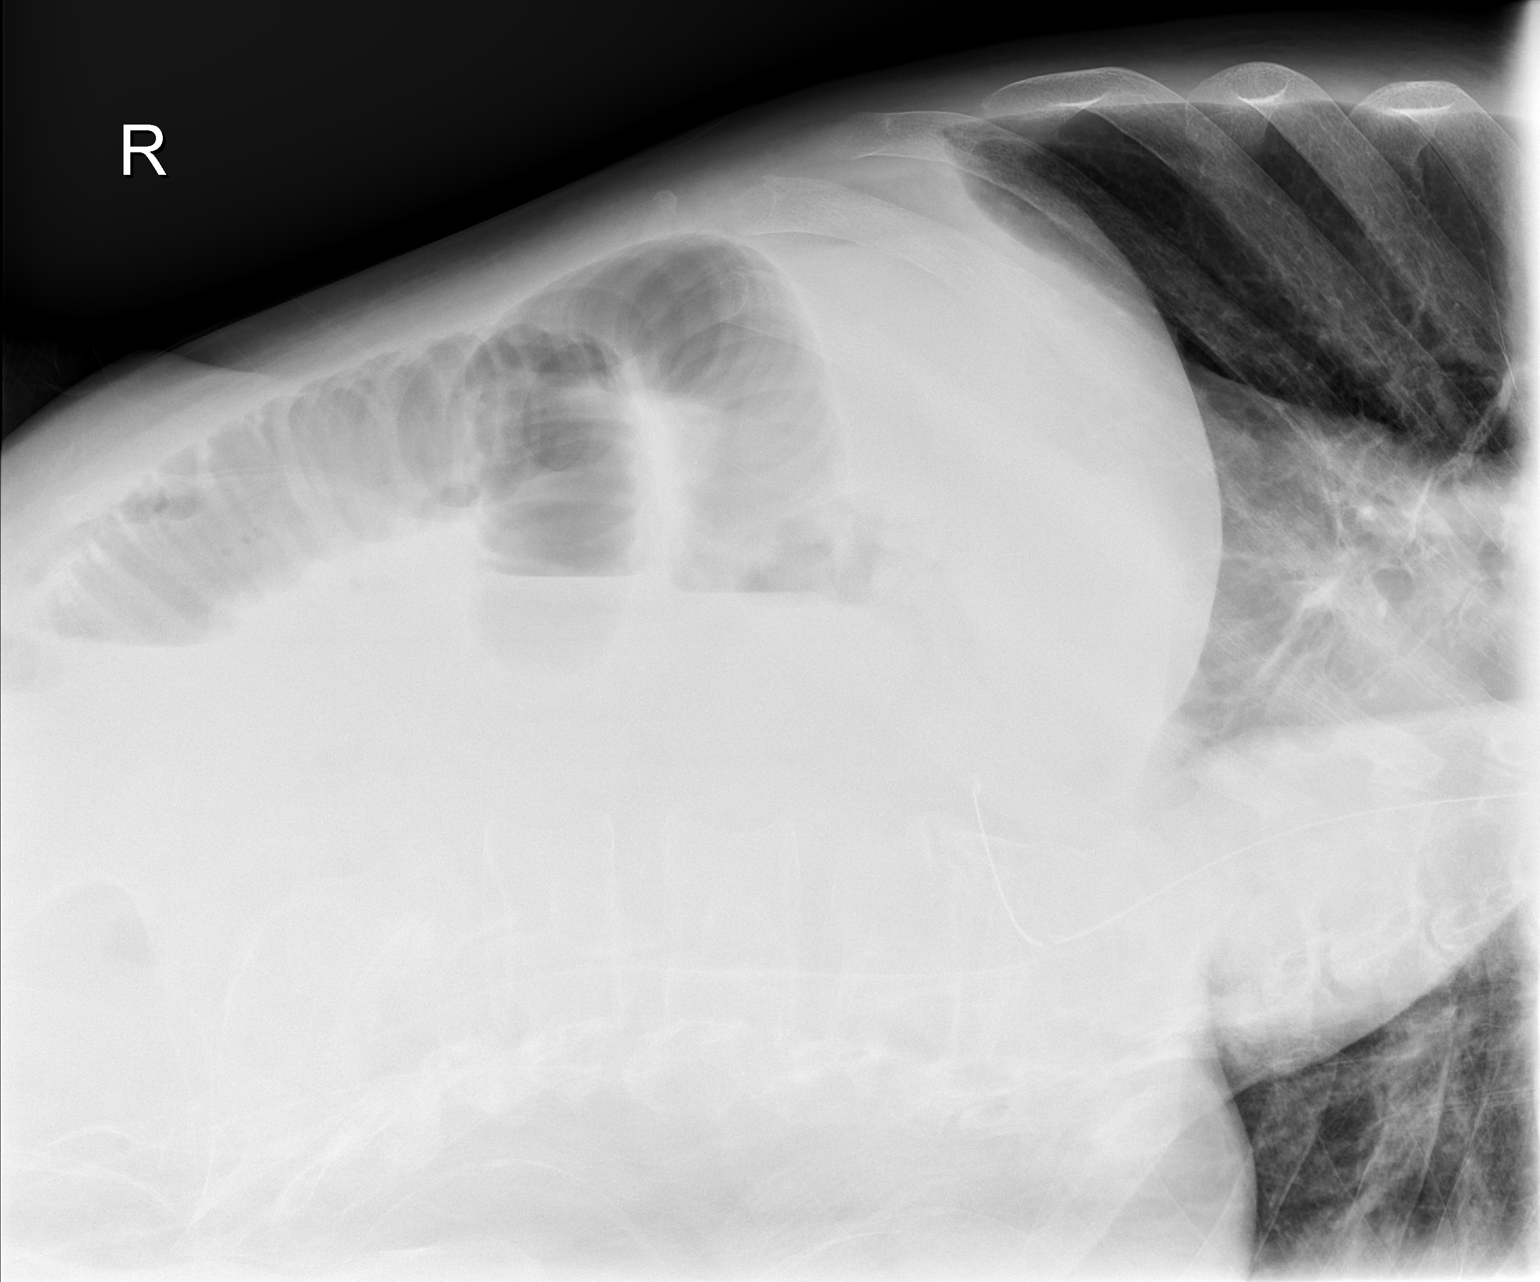

[t abdomen supine]
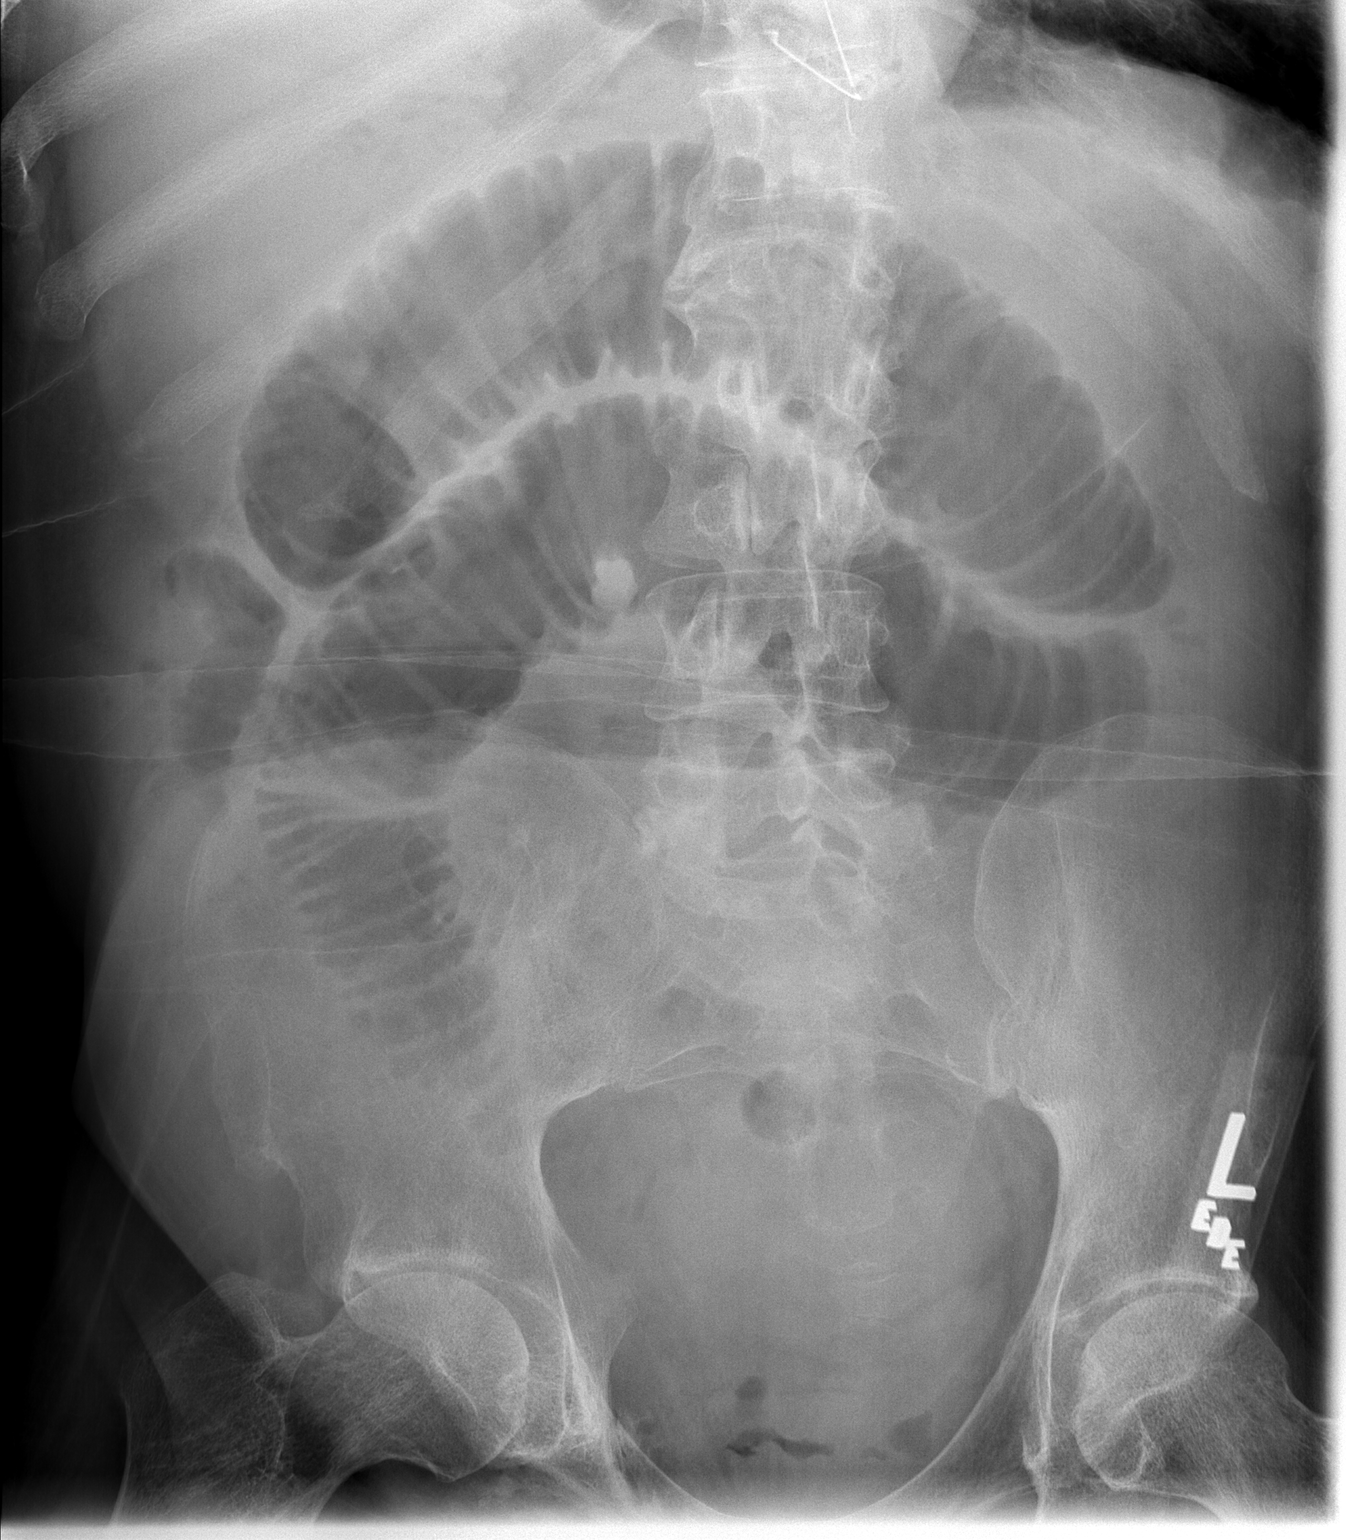

[2 of 2 positions shown; findings below may reference images not displayed]

FINDINGS: The tip of the nasogastric tube projects just below the
gastroesophageal junction.  Proximal small bowel distension has
worsened.  There is mild small bowel wall thickening.  No free
intraperitoneal air is evident.
IMPRESSION: Persistent small bowel obstruction with worsening small bowel
distension.    Nasogastric tube position is unchanged; this did
appear to project into the distal gastric body on the CT done
yesterday.

## 2009-09-29 ENCOUNTER — Telehealth: Payer: Self-pay | Admitting: Internal Medicine

## 2009-09-29 ENCOUNTER — Ambulatory Visit: Payer: Self-pay | Admitting: Internal Medicine

## 2009-11-06 ENCOUNTER — Telehealth: Payer: Self-pay | Admitting: *Deleted

## 2009-11-06 ENCOUNTER — Encounter: Payer: Self-pay | Admitting: Internal Medicine

## 2009-11-23 ENCOUNTER — Telehealth: Payer: Self-pay | Admitting: Infectious Diseases

## 2009-11-29 ENCOUNTER — Emergency Department (HOSPITAL_COMMUNITY): Admission: EM | Admit: 2009-11-29 | Discharge: 2009-11-30 | Payer: Self-pay | Admitting: Emergency Medicine

## 2009-12-07 ENCOUNTER — Encounter: Payer: Self-pay | Admitting: Internal Medicine

## 2009-12-07 ENCOUNTER — Telehealth (INDEPENDENT_AMBULATORY_CARE_PROVIDER_SITE_OTHER): Payer: Self-pay | Admitting: *Deleted

## 2010-01-02 ENCOUNTER — Telehealth: Payer: Self-pay | Admitting: Internal Medicine

## 2010-01-05 ENCOUNTER — Encounter: Payer: Self-pay | Admitting: Internal Medicine

## 2010-01-22 ENCOUNTER — Ambulatory Visit: Payer: Self-pay | Admitting: Internal Medicine

## 2010-01-22 LAB — CONVERTED CEMR LAB
Alkaline Phosphatase: 71 units/L (ref 39–117)
Basophils Absolute: 0 10*3/uL (ref 0.0–0.1)
Cholesterol: 127 mg/dL (ref 0–200)
Creatinine, Ser: 0.69 mg/dL (ref 0.40–1.50)
Eosinophils Relative: 2 % (ref 0–5)
Glucose, Bld: 99 mg/dL (ref 70–99)
HCT: 42.9 % (ref 39.0–52.0)
HDL: 35 mg/dL — ABNORMAL LOW (ref >39)
LDL Cholesterol: 76 mg/dL (ref 0–99)
Lymphocytes Relative: 16 % (ref 12–46)
Lymphs Abs: 1.8 10*3/uL (ref 0.7–4.0)
Monocytes Relative: 9 % (ref 3–12)
Platelets: 289 10*3/uL (ref 150–400)
RBC: 4.89 M/uL (ref 4.22–5.81)
Sodium: 135 meq/L (ref 135–145)
Total Bilirubin: 0.6 mg/dL (ref 0.3–1.2)
Total CHOL/HDL Ratio: 3.6
Total Protein: 7.2 g/dL (ref 6.0–8.3)
Triglycerides: 81 mg/dL (ref <150)
VLDL: 16 mg/dL (ref 0–40)
WBC: 11.3 10*3/uL — ABNORMAL HIGH (ref 4.0–10.5)

## 2010-02-17 ENCOUNTER — Emergency Department (HOSPITAL_COMMUNITY): Admission: EM | Admit: 2010-02-17 | Discharge: 2010-02-17 | Payer: Self-pay | Admitting: Emergency Medicine

## 2010-04-18 ENCOUNTER — Encounter: Payer: Self-pay | Admitting: Internal Medicine

## 2010-05-04 ENCOUNTER — Telehealth: Payer: Self-pay | Admitting: *Deleted

## 2010-05-10 ENCOUNTER — Encounter: Payer: Self-pay | Admitting: Internal Medicine

## 2010-06-04 ENCOUNTER — Telehealth: Payer: Self-pay | Admitting: Internal Medicine

## 2010-06-08 ENCOUNTER — Encounter: Payer: Self-pay | Admitting: Internal Medicine

## 2010-06-26 ENCOUNTER — Ambulatory Visit: Payer: Self-pay | Admitting: Internal Medicine

## 2010-06-26 DIAGNOSIS — K5289 Other specified noninfective gastroenteritis and colitis: Secondary | ICD-10-CM

## 2010-06-26 DIAGNOSIS — G894 Chronic pain syndrome: Secondary | ICD-10-CM | POA: Insufficient documentation

## 2010-06-28 ENCOUNTER — Telehealth: Payer: Self-pay | Admitting: *Deleted

## 2010-07-09 LAB — CONVERTED CEMR LAB
ALT: 17 units/L (ref 0–53)
AST: 10 units/L (ref 0–37)
Basophils Absolute: 0 10*3/uL (ref 0.0–0.1)
Basophils Relative: 0 % (ref 0–1)
CO2: 25 meq/L (ref 19–32)
Calcium: 9.3 mg/dL (ref 8.4–10.5)
Chloride: 99 meq/L (ref 96–112)
Hemoglobin: 12.6 g/dL — ABNORMAL LOW (ref 13.0–17.0)
Lymphocytes Relative: 23 % (ref 12–46)
MCHC: 32.4 g/dL (ref 30.0–36.0)
Neutro Abs: 6.1 10*3/uL (ref 1.7–7.7)
Neutrophils Relative %: 67 % (ref 43–77)
Platelets: 551 10*3/uL — ABNORMAL HIGH (ref 150–400)
Potassium: 4.8 meq/L (ref 3.5–5.3)
RDW: 17.6 % — ABNORMAL HIGH (ref 11.5–15.5)
Sodium: 134 meq/L — ABNORMAL LOW (ref 135–145)
TSH: 0.689 microintl units/mL (ref 0.350–4.5)
Total Protein: 7.1 g/dL (ref 6.0–8.3)

## 2010-08-02 ENCOUNTER — Ambulatory Visit: Payer: Self-pay | Admitting: Internal Medicine

## 2010-08-31 ENCOUNTER — Ambulatory Visit: Payer: Self-pay | Admitting: Internal Medicine

## 2010-10-02 ENCOUNTER — Ambulatory Visit: Payer: Self-pay | Admitting: Internal Medicine

## 2010-10-25 ENCOUNTER — Telehealth: Payer: Self-pay | Admitting: Internal Medicine

## 2010-10-26 ENCOUNTER — Encounter: Payer: Self-pay | Admitting: Internal Medicine

## 2010-10-26 ENCOUNTER — Telehealth: Payer: Self-pay | Admitting: Internal Medicine

## 2010-12-13 ENCOUNTER — Ambulatory Visit: Admission: RE | Admit: 2010-12-13 | Discharge: 2010-12-13 | Payer: Self-pay | Source: Home / Self Care

## 2010-12-13 ENCOUNTER — Encounter: Payer: Self-pay | Admitting: Internal Medicine

## 2010-12-13 DIAGNOSIS — N390 Urinary tract infection, site not specified: Secondary | ICD-10-CM | POA: Insufficient documentation

## 2010-12-13 DIAGNOSIS — E538 Deficiency of other specified B group vitamins: Secondary | ICD-10-CM | POA: Insufficient documentation

## 2010-12-13 LAB — CONVERTED CEMR LAB
BUN: 12 mg/dL (ref 6–23)
Calcium: 8.6 mg/dL (ref 8.4–10.5)
Cholesterol: 117 mg/dL (ref 0–200)
Creatinine, Ser: 0.64 mg/dL (ref 0.40–1.50)
Glucose, Bld: 95 mg/dL (ref 70–99)
Hemoglobin: 14 g/dL (ref 13.0–17.0)
MCHC: 33.9 g/dL (ref 30.0–36.0)
MCV: 83.6 fL (ref 78.0–100.0)
RBC: 4.94 M/uL (ref 4.22–5.81)
RDW: 17.4 % — ABNORMAL HIGH (ref 11.5–15.5)
Triglycerides: 86 mg/dL (ref <150)

## 2010-12-14 ENCOUNTER — Encounter: Payer: Self-pay | Admitting: Internal Medicine

## 2010-12-27 ENCOUNTER — Ambulatory Visit
Admission: RE | Admit: 2010-12-27 | Discharge: 2010-12-27 | Payer: Self-pay | Source: Home / Self Care | Attending: Internal Medicine | Admitting: Internal Medicine

## 2010-12-30 ENCOUNTER — Encounter: Payer: Self-pay | Admitting: Neurosurgery

## 2011-01-01 ENCOUNTER — Telehealth: Payer: Self-pay | Admitting: Internal Medicine

## 2011-01-04 ENCOUNTER — Telehealth: Payer: Self-pay | Admitting: Internal Medicine

## 2011-01-10 NOTE — Assessment & Plan Note (Signed)
Summary: EST-CK/FU/MEDS/CFB   Vital Signs:  Patient profile:   60 year old male Temp:     97.3 degrees F oral Pulse rate:   93 / minute BP sitting:   122 / 81  (right arm)  Vitals Entered By: Angelina Ok RN (January 22, 2010 11:14 AM) Is Patient Diabetic? No Pain Assessment Patient in pain? yes     Location: LEGS , HIPS Intensity: 5 Type: aching Onset of pain  Constant  Have you ever been in a relationship where you felt threatened, hurt or afraid?No   Does patient need assistance? Functional Status Self care, Cook/clean, Shopping, Social activities Ambulation Normal, Wheelchair Comments Pain in neck when it is cold.  Needs refills on meds.  Check up.   Primary Care Provider:  Julaine Fusi  DO   History of Present Illness: Shawn [name] comes in today for routine follow-up on his hypertension, chronic pain and for preventive care.  Depression History:      The patient denies a depressed mood most of the day and a diminished interest in his usual daily activities.        The patient denies that he feels like life is not worth living, denies that he wishes that he were dead, and denies that he has thought about ending his life.         Preventive Screening-Counseling & Management  Alcohol-Tobacco     Smoking Status: quit     Smoking Cessation Counseling: yes     Packs/Day: 1/3     Year Quit: 08/2008     Passive Smoke Exposure: no  Current Medications (verified): 1)  Catapres 0.1 Mg Tabs (Clonidine Hcl) .... Take 1 Tablet By Mouth Twice Daily 2)  Gemfibrozil 600 Mg Tabs (Gemfibrozil) .... Take 1 Tablet By Mouth Two Times A Day 3)  Anusol-Hc 25 Mg  Supp (Hydrocortisone Acetate) .... Use Pr Two Times A Day As Needed For Hemmorhoids 4)  Niacin Cr 1000 Mg  Tbcr (Niacin) .... Take 1 Tablet By Mouth At Bedtime 5)  Ambien Cr 12.5 Mg Cr-Tabs (Zolpidem Tartrate) .... Take 1 Tablet By Mouth Once A Day At Bedtime 6)  Prevacid 30 Mg Cpdr (Lansoprazole) .... Take 1 Tablet By Mouth  Daily. 7)  Benazepril-Hydrochlorothiazide 20-25 Mg Tabs (Benazepril-Hydrochlorothiazide) .... Take 1 Tablet By Mouth Once A Day 8)  Percocet 7.5-325 Mg Tabs (Oxycodone-Acetaminophen) .... Take 1 Tablet By Mouth Four Times A Day As Needed Pain 9)  Doxycycline Hyclate 100 Mg Solr (Doxycycline Hyclate) .... Take 1 Tablet By Mouth Two Times A Day 10)  Ipratropium Bromide 0.06 % Soln (Ipratropium Bromide) .... One-Two Sprays in Each Nostril Three Times Daily As Directed 11)  Lidocaine-Hydrocortisone Ace 3-0.5 % Crea (Lidocaine-Hydrocortisone Ace) .... Apply To Affected Area As Directed Two Times A Day 12)  Ciprofloxacin Hcl 500 Mg Tabs (Ciprofloxacin Hcl) .... One By Mouth Two Times A Day For 10 Days 13)  Tramadol Hcl 200 Mg Xr24h-Tab (Tramadol Hcl) .... Take 1 Tablet By Mouth Once A Day For Pain  Allergies (verified): No Known Drug Allergies  Physical Exam  General:  alert.   Neck:  supple and full ROM.  muscle spasm neck- multiple tenderpoints Lungs:  normal respiratory effort and no accessory muscle use.   Heart:  normal rate, regular rhythm, no murmur, and no gallop.     Impression & Recommendations:  Problem # 1:  CERVICAL SPASM (ICD-847.0) Continue pain control regimen.  His updated medication list for this problem includes:  Percocet 7.5-325 Mg Tabs (Oxycodone-acetaminophen) .Marland Kitchen... Take 1 tablet by mouth four times a day as needed pain    Tramadol Hcl 200 Mg Xr24h-tab (Tramadol hcl) .Marland Kitchen... Take 1 tablet by mouth once a day for pain  Discussed exercises and use of moist heat or cold and medication.   Problem # 2:  HYPERLIPIDEMIA (ICD-272.4) Will check lipids.  His updated medication list for this problem includes:    Gemfibrozil 600 Mg Tabs (Gemfibrozil) .Marland Kitchen... Take 1 tablet by mouth two times a day    Niacin Cr 1000 Mg Tbcr (Niacin) .Marland Kitchen... Take 1 tablet by mouth at bedtime  Orders: T-Comprehensive Metabolic Panel [id number]) T-Lipid Profile [phone]) T-CBC w/Diff  [phone])  Labs Reviewed: SGOT: 11 (12/22/2008)   SGPT: 9 (12/22/2008)   HDL:31 (07/27/2008), 24 (01/08/2007)  LDL:55 (07/27/2008), 70 (01/08/2007)  Chol:109 (07/27/2008), 142 (01/08/2007)  Trig:113 (07/27/2008), 242 (01/08/2007)  Problem # 3:  DECUBITUS ULCER, BUTTOCK (ICD-707.05) healed.   Complete Medication List: 1)  Catapres 0.1 Mg Tabs (Clonidine hcl) .... Take 1 tablet by mouth twice daily 2)  Gemfibrozil 600 Mg Tabs (Gemfibrozil) .... Take 1 tablet by mouth two times a day 3)  Anusol-hc 25 Mg Supp (Hydrocortisone acetate) .... Use pr two times a day as needed for hemmorhoids 4)  Niacin Cr 1000 Mg Tbcr (Niacin) .... Take 1 tablet by mouth at bedtime 5)  Ambien Cr 12.5 Mg Cr-tabs (Zolpidem tartrate) .... Take 1 tablet by mouth once a day at bedtime 6)  Prevacid 30 Mg Cpdr (Lansoprazole) .... Take 1 tablet by mouth daily. 7)  Benazepril-hydrochlorothiazide 20-25 Mg Tabs (Benazepril-hydrochlorothiazide) .... Take 1 tablet by mouth once a day 8)  Percocet 7.5-325 Mg Tabs (Oxycodone-acetaminophen) .... Take 1 tablet by mouth four times a day as needed pain 9)  Doxycycline Hyclate 100 Mg Solr (Doxycycline hyclate) .... Take 1 tablet by mouth two times a day 10)  Ipratropium Bromide 0.06 % Soln (Ipratropium bromide) .... One-two sprays in each nostril three times daily as directed 11)  Lidocaine-hydrocortisone Ace 3-0.5 % Crea (Lidocaine-hydrocortisone ace) .... Apply to affected area as directed two times a day 12)  Ciprofloxacin Hcl 500 Mg Tabs (Ciprofloxacin hcl) .... One by mouth two times a day for 10 days 13)  Tramadol Hcl 200 Mg Xr24h-tab (Tramadol hcl) .... Take 1 tablet by mouth once a day for pain  Patient Instructions: 1)  Please schedule a follow-up appointment in 3 months. Prescriptions: AMBIEN CR 12.5 MG CR-TABS (ZOLPIDEM TARTRATE) Take 1 tablet by mouth once a day at bedtime  #30 x 5   Entered and Authorized by:   Julaine Fusi  DO   Signed by:   Julaine Fusi  DO on  01/22/2010   Method used:   Print then Give to Patient   RxID:   [id number] PREVACID 30 MG CPDR (LANSOPRAZOLE) take 1 tablet by mouth daily.  #90 x 5   Entered and Authorized by:   Julaine Fusi  DO   Signed by:   Julaine Fusi  DO on 01/22/2010   Method used:   Electronically to        [facility] [phone]* (retail)       [address]       [place], [place]  [address]       Ph: [phone] or [phone]       Fax: [phone]   RxID:   [id number] BENAZEPRIL-HYDROCHLOROTHIAZIDE 20-25 MG TABS (BENAZEPRIL-HYDROCHLOROTHIAZIDE) Take 1 tablet by  mouth once a day  #30 x 5   Entered and Authorized by:   Julaine Fusi  DO   Signed by:   Julaine Fusi  DO on 01/22/2010   Method used:   Electronically to        [name] [place] [phone]* (retail)       [address]       [place], [place]  [address]       Ph: [phone] or [phone]       Fax: [phone]   RxID:   [id number] ANUSOL-HC 25 MG  SUPP (HYDROCORTISONE ACETATE) Use PR two times a day as needed for hemmorhoids  #20 x 6   Entered and Authorized by:   Julaine Fusi  DO   Signed by:   Julaine Fusi  DO on 01/22/2010   Method used:   Electronically to        [facility] [phone]* (retail)       [address]       [place], [place]  [address]       Ph: [phone] or [phone]       Fax: [phone]   RxID:   [id number] CATAPRES 0.1 MG TABS (CLONIDINE HCL) Take 1 tablet by mouth twice daily  #90 x 1   Entered and Authorized by:   Julaine Fusi  DO   Signed by:   Julaine Fusi  DO on 01/22/2010   Method used:   [facility]ronically to        [facility] [phone]* (retail)       [address]       [place], [place]  [address]       Ph: [phone] or [phone]       Fax: [phone]   RxID:   [id number] PERCOCET 7.5-325 MG TABS (OXYCODONE-ACETAMINOPHEN) Take 1 tablet by mouth four times a day as needed pain  #120 x 0   Entered and Authorized by:    Julaine Fusi  DO   Signed by:   Julaine Fusi  DO on 01/22/2010   Method used:   Print then Give to Patient   RxID:   [id number] TRAMADOL HCL 200 MG XR24H-TAB (TRAMADOL HCL) Take 1 tablet by mouth once a day for pain  #31 x 6   Entered and Authorized by:   Julaine Fusi  DO   Signed by:   Julaine Fusi  DO on 01/22/2010   Method used:   Print then Give to Patient   RxID:   [id number]   Prevention & Chronic Care Immunizations   Influenza vaccine: Fluvax Non-MCR  (09/29/2009)    Tetanus booster: Not documented    Pneumococcal vaccine: Not documented    H. zoster vaccine: Not documented  Colorectal Screening   Hemoccult: Not documented    Colonoscopy: diverticular disease, normal  (07/10/2007)  Other Screening   PSA: 0.61  (07/27/2008)   Smoking status: quit  (01/22/2010)  Lipids   Total Cholesterol: 109  (07/27/2008)   LDL: 55  (07/27/2008)   LDL Direct: Not documented   HDL: 31  (07/27/2008)   Triglycerides: 113  (07/27/2008)    SGOT (AST): 11  (12/22/2008)   SGPT (ALT): 9  (12/22/2008) CMP ordered    Alkaline phosphatase: 74  (12/22/2008)   Total bilirubin: 0.6  (12/22/2008)  Hypertension   Last Blood  Pressure: 122 / 81  (01/22/2010)   Serum creatinine: 0.62  (01/12/2009)   Serum potassium 4.3  (01/12/2009) CMP ordered   Self-Management Support :    Patient will work on the following items until the next clinic visit to reach self-care goals:     Medications and monitoring: take my medicines every day, bring all of my medications to every visit  (01/22/2010)     Eating: drink diet soda or water instead of juice or soda, eat more vegetables, eat foods that are low in salt, eat baked foods instead of fried foods, eat fruit for snacks and desserts  (01/22/2010)    Hypertension self-management support: Not documented    Lipid self-management support: Not documented    Process Orders Check Orders Results:     [facility]: Check  successful Tests Sent for requisitioning (January 24, 2010 4:31 PM):     01/22/2010: [facility] -- T-Comprehensive Metabolic Panel [[id number]] (signed)     01/22/2010: [facility] -- T-Lipid Profile [phone] (signed)     01/22/2010: [facility] -- [facility] w/Diff [[id number]] (signed)

## 2011-01-10 NOTE — Assessment & Plan Note (Signed)
Summary: MONTHLY B12 INJ/CH  Nurse Visit   Allergies: No Known Drug Allergies  Medication Administration  Injection # 1:    Medication: Vit B12 1000 mcg    Diagnosis: ANEMIA (ICD-285.9)    Route: IM    Site: R deltoid    Exp Date: 07/2012    Lot #: [id number]    Mfr: [employer]    Patient tolerated injection without complications    Given by: Marin Roberts RN (October 02, 2010 2:41 PM)  Orders Added: 1)  Vit B12 1000 mcg [J3420] 2)  Admin of Therapeutic Inj  intramuscular or subcutaneous [[id number]]

## 2011-01-10 NOTE — Assessment & Plan Note (Signed)
Summary: MONTHLY B12 INJ/CH  Nurse Visit   Allergies: No Known Drug Allergies  Medication Administration  Injection # 1:    Medication: Vit B12 1000 mcg    Diagnosis: ANEMIA (ICD-285.9)    Route: IM    Site: R deltoid    Exp Date: 04/2012    Lot #: [id number]    Mfr: [employer]    Patient tolerated injection without complications    Given by: Marin Roberts RN (August 31, 2010 10:59 AM)  Orders Added: 1)  Admin of Therapeutic Inj  intramuscular or subcutaneous [[id number]] 2)  Vit B12 1000 mcg [J3420]

## 2011-01-10 NOTE — Assessment & Plan Note (Signed)
Summary: EST-CK/FU/MED/   Vital Signs:  Patient profile:   60 year old male Temp:     97.2 degrees F (36.22 degrees C) oral Pulse rate:   58 / minute BP sitting:   88 / 58  (right arm)  Vitals Entered By: Angelina Ok RN (June 26, 2010 10:03 AM) CC: Depression Is Patient Diabetic? No Pain Assessment Patient in pain? yes     Location: legs, shoulders Intensity: 8 Type: aching Onset of pain  Constant  Have you ever been in a relationship where you felt threatened, hurt or afraid?No   Does patient need assistance? Functional Status Self care Ambulation Wheelchair Comments Needs refills on meds.  Uses a wheelchair.  Check up.  Needsrefill on Clonidine and Benazepril.   Primary Care Provider:  Julaine Fusi  DO  CC:  Depression.  History of Present Illness: Shawn [name] comes in today for routine follow-up. He has been doing very well at home except for lats week he had gastroebnteritis for a few days and is still recovering. He has been drinking lots of fluids. 2 days of vommittying and diarhea-now resolved. Otehrwise no complaints. Pain is well controlled and decubitus has completely healed. Deneis any chest painm FC, or continued NVD.  Depression History:      The patient denies a depressed mood most of the day and a diminished interest in his usual daily activities.         Preventive Screening-Counseling & Management  Alcohol-Tobacco     Smoking Status: quit     Smoking Cessation Counseling: yes     Packs/Day: 1/3     Year Quit: 08/2008     Passive Smoke Exposure: no  Current Medications (verified): 1)  Ambien Cr 12.5 Mg Cr-Tabs (Zolpidem Tartrate) .... Take 1 Tablet By Mouth Once A Day At Bedtime 2)  Prevacid 30 Mg Cpdr (Lansoprazole) .... Take 1 Tablet By Mouth Daily. 3)  Percocet 10-325 Mg Tabs (Oxycodone-Acetaminophen) .... Take 1 Tablet By Mouth Four Times A Day 4)  Ipratropium Bromide 0.06 % Soln (Ipratropium Bromide) .... One-Two Sprays in Each Nostril Three  Times Daily As Directed 5)  Lidocaine-Hydrocortisone Ace 3-0.5 % Crea (Lidocaine-Hydrocortisone Ace) .... Apply To Affected Area As Directed Two Times A Day 6)  Hydrochlorothiazide 25 Mg Tabs (Hydrochlorothiazide) .... Take 1 Tablet By Mouth Once A Day  Allergies (verified): No Known Drug Allergies  Review of Systems      See HPI  Physical Exam  General:  alert.  normal appearance.  paraplegic sitting in WC Lungs:  normal respiratory effort and no accessory muscle use.   Heart:  normal rate, regular rhythm, no murmur, and no gallop.   Abdomen:  soft, non-tender, hyperactive bowel sounds, no masses, no guarding, no rebound tenderness, no hepatomegaly, and no splenomegaly.   Msk:  no joint tenderness, no joint swelling, no joint warmth, and no redness over joints.   Pulses:  normal radial pulses. No bruits. Neurologic:  alert & oriented X3 and cranial nerves II-XII intact.   Skin:  no rashes, no suspicious lesions, and no ecchymoses.   Psych:  Oriented X3, memory intact for recent and remote, and normally interactive.     Impression & Recommendations:  Problem # 1:  ARTHRITIS (ICD-716.90) Continue current pain regimen.  Problem # 2:  HYPERTENSION, BENIGN ESSENTIAL (ICD-401.1) Hold HCTZ and Lisinopril for 2-3 days until he is more hydrated. will have HH check BP. Slightly low today. May need to cut back permanatly on some of  his meds.  The following medications were removed from the medication list:    Catapres 0.1 Mg Tabs (Clonidine hcl) .Marland Kitchen... Take 1 tablet by mouth twice daily    Benazepril-hydrochlorothiazide 20-25 Mg Tabs (Benazepril-hydrochlorothiazide) .Marland Kitchen... Take 1 tablet by mouth once a day His updated medication list for this problem includes:    Hydrochlorothiazide 25 Mg Tabs (Hydrochlorothiazide) .Marland Kitchen... Take 1 tablet by mouth once a day  BP today: 88/58 Prior BP: 122/81 (01/22/2010)  Labs Reviewed: K+: 3.6 (01/22/2010) Creat: : 0.69 (01/22/2010)   Chol: 127  (01/22/2010)   HDL: 35 (01/22/2010)   LDL: 76 (01/22/2010)   TG: 81 (01/22/2010)  Problem # 3:  CHRONIC PAIN SYNDROME (ICD-338.4) Continue Percocet. Given 3 scripts today.  Problem # 4:  EXTERNAL HEMORRHOIDS (ICD-455.3) Will refill topical cream.  Complete Medication List: 1)  Ambien Cr 12.5 Mg Cr-tabs (Zolpidem tartrate) .... Take 1 tablet by mouth once a day at bedtime 2)  Prevacid 30 Mg Cpdr (Lansoprazole) .... Take 1 tablet by mouth daily. 3)  Percocet 10-325 Mg Tabs (Oxycodone-acetaminophen) .... Take 1 tablet by mouth four times a day 4)  Ipratropium Bromide 0.06 % Soln (Ipratropium bromide) .... One-two sprays in each nostril three times daily as directed 5)  Lidocaine-hydrocortisone Ace 3-0.5 % Crea (Lidocaine-hydrocortisone ace) .... Apply to affected area as directed two times a day 6)  Hydrochlorothiazide 25 Mg Tabs (Hydrochlorothiazide) .... Take 1 tablet by mouth once a day  Other Orders: T-Comprehensive Metabolic Panel [phone]) T-CBC w/Diff ([id number]) T-Vitamin B12 ([id number]) T-TSH ([id number])  Patient Instructions: 1)  F/U in 1 month for BP check 2)  For ER you may go to [facility]. Prescriptions: LIDOCAINE-HYDROCORTISONE ACE 3-0.5 % CREA (LIDOCAINE-HYDROCORTISONE ACE) Apply to affected area as directed two times a day  #1 tube x 6   Entered and Authorized by:   Julaine Fusi  DO   Signed by:   Julaine Fusi  DO on 06/26/2010   Method used:   Electronically to        [facility] [phone]* (retail)       [address]       [place], [place]  [address]       Ph: [phone] or [phone]       Fax: [phone]   RxID:   [id number] PERCOCET 10-325 MG TABS (OXYCODONE-ACETAMINOPHEN) Take 1 tablet by mouth four times a day  #120 x 0   Entered and Authorized by:   Julaine Fusi  DO   Signed by:   Julaine Fusi  DO on 06/26/2010   Method used:   Reprint   RxID:   [id number] HYDROCHLOROTHIAZIDE 25 MG TABS  (HYDROCHLOROTHIAZIDE) Take 1 tablet by mouth once a day  #30 x 3   Entered and Authorized by:   Julaine Fusi  DO   Signed by:   Julaine Fusi  DO on 06/26/2010   Method used:   Print then Give to Patient   RxID:   [id number] PERCOCET 10-325 MG TABS (OXYCODONE-ACETAMINOPHEN) Take 1 tablet by mouth four times a day  #120 x 0   Entered and Authorized by:   Julaine Fusi  DO   Signed by:   Julaine Fusi  DO on 06/26/2010   Method used:   Print then Give to Patient   RxID:   [id number]   Prevention & Chronic Care Immunizations   Influenza vaccine: Fluvax Non-MCR  (09/29/2009)    Tetanus booster: Not documented  Pneumococcal vaccine: Not documented    H. zoster vaccine: Not documented  Colorectal Screening   Hemoccult: Not documented    Colonoscopy: diverticular disease, normal  (07/10/2007)   Colonoscopy due: 07/09/2012  Other Screening   PSA: 0.61  (07/27/2008)   Smoking status: quit  (06/26/2010)  Lipids   Total Cholesterol: 127  (01/22/2010)   LDL: 76  (01/22/2010)   LDL Direct: Not documented   HDL: 35  (01/22/2010)   Triglycerides: 81  (01/22/2010)    SGOT (AST): 15  (01/22/2010)   SGPT (ALT): 13  (01/22/2010) CMP ordered    Alkaline phosphatase: 71  (01/22/2010)   Total bilirubin: 0.6  (01/22/2010)    Lipid flowsheet reviewed?: Yes   Progress toward LDL goal: At goal  Hypertension   Last Blood Pressure: 88 / 58  (06/26/2010)   Serum creatinine: 0.69  (01/22/2010)   Serum potassium 3.6  (01/22/2010) CMP ordered   Self-Management Support :    Patient will work on the following items until the next clinic visit to reach self-care goals:     Medications and monitoring: take my medicines every day  (06/26/2010)     Eating: drink diet soda or water instead of juice or soda, eat more vegetables, use fresh or frozen vegetables, eat foods that are low in salt, eat baked foods instead of fried foods, eat fruit for snacks and desserts, limit or avoid  alcohol  (06/26/2010)    Hypertension self-management support: Written self-care plan, Education handout, Pre-printed educational material, Resources for patients handout  (06/26/2010)   Hypertension self-care plan printed.   Hypertension education handout printed    Lipid self-management support: Written self-care plan, Education handout, Pre-printed educational material, Referred for self-management class, Resources for patients handout  (06/26/2010)   Lipid self-care plan printed.   Lipid education handout printed      Resource handout printed.  Process Orders Check Orders Results:     [facility]: Order checked:     23330 -- T-Vitamin B12 -- ABN required due to diagnosis (CPT: [phone]) Tests Sent for requisitioning (July 15, 2010 4:59 PM):     06/26/2010: [facility] -- T-Comprehensive Metabolic Panel [[id number]] (signed)     06/26/2010: [facility] -- T-CBC w/Diff [[id number]] (signed)     06/26/2010: [facility] -- T-Vitamin B12 [phone] (signed)     06/26/2010: [facility] -- T-TSH [phone] (signed)   Process Orders Check Orders Results:     [facility]: Order checked:     23330 -- T-Vitamin B12 -- ABN required due to diagnosis (CPT: [phone]) Tests Sent for requisitioning (July 15, 2010 4:59 PM):     06/26/2010: [facility] -- T-Comprehensive Metabolic Panel [[id number]] (signed)     06/26/2010: [facility] -- T-CBC w/Diff [[id number]] (signed)     06/26/2010: [facility] -- T-Vitamin B12 [phone] (signed)     06/26/2010: [facility] -- T-TSH [phone] (signed)

## 2011-01-10 NOTE — Medication Information (Signed)
Summary: PERCOCET/  PERCOCET/   Imported By: Margie Billet 06/12/2010 14:17:10  _____________________________________________________________________  External Attachment:    Type:   Image     Comment:   External Document
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
## 2011-01-10 NOTE — Progress Notes (Signed)
Summary: refill/gg  Phone Note Refill Request  on January 02, 2010 4:10 PM  Refills Requested: Medication #1:  PERCOCET 7.5-325 MG TABS Take 1 tablet by mouth four times a day as needed pain   Last Refilled: 12/07/2009 call when ready [phone]   **Due Friday   Method Requested: Pick up at Office Initial call taken by: Merrie Roof RN,  January 02, 2010 4:11 PM  Follow-up for Phone Call        Last seen 05/2009.  Was supposed to F/U in 3 months.  Didn't F/U.  Will refill one month but needs an appt within next thirty days to receive any additional refills. Follow-up by: Blanch Media MD,  January 05, 2010 12:14 PM    Prescriptions: PERCOCET 7.5-325 MG TABS (OXYCODONE-ACETAMINOPHEN) Take 1 tablet by mouth four times a day as needed pain  #120 x 0   Entered and Authorized by:   Blanch Media MD   Signed by:   Blanch Media MD on 01/05/2010   Method used:   Print then Give to Patient   RxID:   [id number]

## 2011-01-10 NOTE — Assessment & Plan Note (Signed)
Summary: EST-1 MONTH F/U VISIT/CH   Vital Signs:  Patient profile:   60 year old male Temp:     98.1 degrees F (36.72 degrees C) oral Pulse rate:   88 / minute BP sitting:   158 / 103  (right arm)  Vitals Entered By: Cynda Familia Duncan Dull) (August 02, 2010 8:35 AM) CC: f/u bp Is Patient Diabetic? No Pain Assessment Patient in pain? no       Does patient need assistance? Functional Status Cook/clean, Shopping Ambulation Wheelchair Comments arrived   Primary Care Provider:  Julaine Fusi  DO  CC:  f/u bp.  History of Present Illness: Mr. Peral comes in today for routione follow-up and blood pressure check. He was hypotensive at his last visit and I held his HCTZ and otehr BP meds. He was getting over a viral GI infection at the time. Much better today- has been check pressure at hiome and systolic has been in teh 120-130. High today because he and his wife have been "at each other" all morning. He didnt sleep well last night. Otherwise no complaints.  Preventive Screening-Counseling & Management  Alcohol-Tobacco     Smoking Status: current     Smoking Cessation Counseling: yes     Packs/Day: 0.5     Year Quit: 08/2008     Passive Smoke Exposure: no  Current Medications (verified): 1)  Ambien Cr 12.5 Mg Cr-Tabs (Zolpidem Tartrate) .... Take 1 Tablet By Mouth Once A Day At Bedtime 2)  Prevacid 30 Mg Cpdr (Lansoprazole) .... Take 1 Tablet By Mouth Daily. 3)  Percocet 10-325 Mg Tabs (Oxycodone-Acetaminophen) .... Take 1 Tablet By Mouth Four Times A Day 4)  Ipratropium Bromide 0.06 % Soln (Ipratropium Bromide) .... One-Two Sprays in Each Nostril Three Times Daily As Directed 5)  Lidocaine-Hydrocortisone Ace 3-0.5 % Crea (Lidocaine-Hydrocortisone Ace) .... Apply To Affected Area As Directed Two Times A Day 6)  Lisinopril 10 Mg Tabs (Lisinopril) .... Take 1 Tablet By Mouth Once A Day 7)  Cyanocobalamin 1000 Mcg/ml Soln (Cyanocobalamin) .... Q Monthly Injection in  Opc  Allergies (verified): No Known Drug Allergies  Social History: Former 1/2 pack per day Smoker, Quit 9/09 Remote ETOH abuse Married Disabled, [place], functions independently at home  Paraplegic Smoking Status:  current Packs/Day:  0.5  Review of Systems      See HPI  Physical Exam  General:  alert.  normal appearance.  paraplegic sitting in WC Lungs:  normal respiratory effort and no accessory muscle use.   Heart:  normal rate, regular rhythm, no murmur, and no gallop.   Abdomen:  soft, non-tender, hyperactive bowel sounds, no masses, no guarding, no rebound tenderness, no hepatomegaly, and no splenomegaly.   Msk:  no joint tenderness, no joint swelling, no joint warmth, and no redness over joints.   Skin:  no rashes, no suspicious lesions, and no ecchymoses.   Psych:  Oriented X3, memory intact for recent and remote, and normally interactive.     Impression & Recommendations:  Problem # 1:  HYPERTENSION, BENIGN ESSENTIAL (ICD-401.1) Will start him on Lisinopril today. He has tolerated ACE-I well in the past. Will leave off diuretic for now. Recheck in 3 months. Will continue to record home pressure and if running high will call for appointment. No need to check renal function- not a new drug start.  The following medications were removed from the medication list:    Hydrochlorothiazide 25 Mg Tabs (Hydrochlorothiazide) .Marland Kitchen... Take 1 tablet by mouth once a day  His updated medication list for this problem includes:    Lisinopril 10 Mg Tabs (Lisinopril) .Marland Kitchen... Take 1 tablet by mouth once a day  BP today: 158/103 Prior BP: 88/58 (06/26/2010)  Labs Reviewed: K+: 4.8 (06/26/2010) Creat: : 0.81 (06/26/2010)   Chol: 127 (01/22/2010)   HDL: 35 (01/22/2010)   LDL: 76 (01/22/2010)   TG: 81 (01/22/2010)  Problem # 2:  CHRONIC PAIN SYNDROME (ICD-338.4) Continue Percocet for pain. Chronic use anticipated. Pain multifactorial- paraplegic with excessive upper extremity joint strain  and low back pain from being chair bound.  Problem # 3:  ANEMIA (ICD-285.9) B12 borderline low. Recommend IM replacement q monthly for 3 months and then consider transition to oral B12.   His updated medication list for this problem includes:    Cyanocobalamin 1000 Mcg/ml Soln (Cyanocobalamin) ..... Q monthly injection in opc  Orders: Vit B12 1000 mcg (J3420)  Complete Medication List: 1)  Ambien Cr 12.5 Mg Cr-tabs (Zolpidem tartrate) .... Take 1 tablet by mouth once a day at bedtime 2)  Prevacid 30 Mg Cpdr (Lansoprazole) .... Take 1 tablet by mouth daily. 3)  Percocet 10-325 Mg Tabs (Oxycodone-acetaminophen) .... Take 1 tablet by mouth four times a day 4)  Ipratropium Bromide 0.06 % Soln (Ipratropium bromide) .... One-two sprays in each nostril three times daily as directed 5)  Lidocaine-hydrocortisone Ace 3-0.5 % Crea (Lidocaine-hydrocortisone ace) .... Apply to affected area as directed two times a day 6)  Lisinopril 10 Mg Tabs (Lisinopril) .... Take 1 tablet by mouth once a day 7)  Cyanocobalamin 1000 Mcg/ml Soln (Cyanocobalamin) .... Q monthly injection in opc  Other Orders: Influenza Vaccine MCR (55732)  Patient Instructions: 1)  Take Blood Pressure at home. 2)  F/U in 3 months. Sooner if BP is running high. 3)  Goal is top number <140 and bottom number <80 Prescriptions: LISINOPRIL 10 MG TABS (LISINOPRIL) Take 1 tablet by mouth once a day  #90 x 0   Entered and Authorized by:   Julaine Fusi  DO   Signed by:   Julaine Fusi  DO on 08/02/2010   Method used:   [facility] to        [facility] [phone]* (retail)       [address]       [place], [place]  [address]       Ph: [phone] or [phone]       Fax: [phone]   RxID:   [id number] PERCOCET 10-325 MG TABS (OXYCODONE-ACETAMINOPHEN) Take 1 tablet by mouth four times a day  #120 x 0   Entered and Authorized by:   Julaine Fusi  DO   Signed by:   Julaine Fusi  DO on 08/02/2010   Method  used:   Print then Give to Patient   RxID:   [id number]    Prevention & Chronic Care Immunizations   Influenza vaccine: Fluvax MCR  (08/02/2010)    Tetanus booster: Not documented    Pneumococcal vaccine: Not documented    H. zoster vaccine: Not documented  Colorectal Screening   Hemoccult: Not documented    Colonoscopy: diverticular disease, normal  (07/10/2007)   Colonoscopy due: 07/09/2012  Other Screening   PSA: 0.61  (07/27/2008)   Smoking status: current  (08/02/2010)   Smoking cessation counseling: yes  (08/02/2010)  Lipids   Total Cholesterol: 127  (01/22/2010)   LDL: 76  (01/22/2010)   LDL Direct: Not documented   HDL: 35  (  01/22/2010)   Triglycerides: 81  (01/22/2010)    SGOT (AST): 10  (06/26/2010)   SGPT (ALT): 17  (06/26/2010)   Alkaline phosphatase: 66  (06/26/2010)   Total bilirubin: 0.3  (06/26/2010)    Lipid flowsheet reviewed?: Yes   Progress toward LDL goal: At goal  Hypertension   Last Blood Pressure: 158 / 103  (08/02/2010)   Serum creatinine: 0.81  (06/26/2010)   Serum potassium 4.8  (06/26/2010)    Hypertension flowsheet reviewed?: Yes   Progress toward BP goal: At goal  Self-Management Support :   Personal Goals (by the next clinic visit) :      Personal blood pressure goal: 140/90  (08/02/2010)     Personal LDL goal: 100  (08/02/2010)    Patient will work on the following items until the next clinic visit to reach self-care goals:     Medications and monitoring: take my medicines every day  (08/02/2010)     Eating: drink diet soda or water instead of juice or soda, eat more vegetables, use fresh or frozen vegetables, eat foods that are low in salt, eat baked foods instead of fried foods, eat fruit for snacks and desserts, limit or avoid alcohol  (06/26/2010)    Hypertension self-management support: Written self-care plan, Resources for patients handout  (08/02/2010)   Hypertension self-care plan printed.    Lipid  self-management support: Written self-care plan, Resources for patients handout  (08/02/2010)   Lipid self-care plan printed.      Resource handout printed.   Nursing Instructions: Give Flu vaccine today B12 Shot 1052mcg/IM    Medication Administration  Injection # 1:    Medication: Vit B12 1000 mcg    Diagnosis: ANEMIA (ICD-285.9)    Route: IM    Site: L deltoid    Exp Date: 01/2012    Lot #: [id number]    Mfr: [employer]    Patient tolerated injection without complications    Given by: Cynda Familia (AAMA) (August 02, 2010 9:30 AM)  Orders Added: 1)  Vit B12 1000 mcg [J3420] 2)  Influenza Vaccine MCR [00025] 3)  Est. Patient Level IV [16109]    Immunizations Administered:  Influenza Vaccine # 1:    Vaccine Type: Fluvax MCR    Site: right deltoid    Mfr: GlaxoSmithKline    Dose: 0.5 ml    Route: IM    Given by: Cynda Familia (AAMA)    Exp. Date: 06/08/2011    Lot #: [id number]    VIS given: 07/02/07 version given August 02, 2010.  Flu Vaccine Consent Questions:    Do you have a history of severe allergic reactions to this vaccine? no    Any prior history of allergic reactions to egg and/or gelatin? no    Do you have a sensitivity to the preservative Thimersol? no    Do you have a past history of Guillan-Barre Syndrome? no    Do you currently have an acute febrile illness? no    Have you ever had a severe reaction to latex? no    Vaccine information given and explained to patient? yes

## 2011-01-10 NOTE — Progress Notes (Signed)
Summary: refill/ hla  Phone Note Refill Request Message from:  Patient on May 04, 2010 11:03 AM  Refills Requested: Medication #1:  PERCOCET 7.5-325 MG TABS Take 1 tablet by mouth four times a day as needed pain   Dosage confirmed as above?Dosage Confirmed   Last Refilled: 4/30 Initial call taken by: Marin Roberts RN,  May 04, 2010 11:03 AM  Follow-up for Phone Call        Refill approved-script in attending office. Follow-up by: Julaine Fusi  DO,  May 10, 2010 3:55 PM  Additional Follow-up for Phone Call Additional follow up Details #1::        Rx given to patient Additional Follow-up by: Marin Roberts RN,  May 10, 2010 4:48 PM    Prescriptions: PERCOCET 7.5-325 MG TABS (OXYCODONE-ACETAMINOPHEN) Take 1 tablet by mouth four times a day as needed pain  #120 x 0   Entered and Authorized by:   Julaine Fusi  DO   Signed by:   Julaine Fusi  DO on 05/10/2010   Method used:   Print then Give to Patient   RxID:   [id number]

## 2011-01-10 NOTE — Progress Notes (Signed)
Summary: Refill/gh  Phone Note Refill Request Message from:  Pharmacy on October 26, 2010 2:50 PM  Refills Requested: Medication #1:  AMBIEN CR 12.5 MG CR-TABS Take 1 tablet by mouth once a day at bedtime  Medication #2:  IPRATROPIUM BROMIDE 0.06 % SOLN One-Two sprays in each nostril three times daily as directed  Method Requested: Electronic Initial call taken by: Angelina Ok RN,  October 26, 2010 2:50 PM  Follow-up for Phone Call        Refill approved-nurse to complete Follow-up by: Julaine Fusi  DO,  October 29, 2010 1:56 PM  Additional Follow-up for Phone Call Additional follow up Details #1::        Rx called to pharmacy Additional Follow-up by: Angelina Ok RN,  October 30, 2010 3:05 PM    Prescriptions: AMBIEN CR 12.5 MG CR-TABS (ZOLPIDEM TARTRATE) Take 1 tablet by mouth once a day at bedtime  #30 x 5   Entered and Authorized by:   Julaine Fusi  DO   Signed by:   Julaine Fusi  DO on 10/29/2010   Method used:   [facility] ...       [facility]  [address] [phone]* (retail)       [address]       [place], [place]  [address]       Ph: [phone] or [phone]       Fax: [phone]   RxID:   [id number] IPRATROPIUM BROMIDE 0.06 % SOLN (IPRATROPIUM BROMIDE) One-Two sprays in each nostril three times daily as directed  #1 inhaler x 11   Entered and Authorized by:   Julaine Fusi  DO   Signed by:   Julaine Fusi  DO on 10/29/2010   Method used:   [facility]phoned to ...       [facility]  [address] [phone]* (retail)       [address]       [place], [place]  [address]       Ph: [phone] or [phone]       Fax: [phone]   RxID:   [id number]

## 2011-01-10 NOTE — Progress Notes (Signed)
Summary: refill/ hla  Phone Note Refill Request Message from:  Patient on October 25, 2010 2:51 PM  Refills Requested: Medication #1:  PERCOCET 10-325 MG TABS Take 1 tablet by mouth four times a day   Dosage confirmed as above?Dosage Confirmed Initial call taken by: Marin Roberts RN,  October 25, 2010 2:51 PM  Follow-up for Phone Call        Refill approved-nurse to complete Follow-up by: Julaine Fusi  DO,  October 26, 2010 2:08 PM    Prescriptions: PERCOCET 10-325 MG TABS (OXYCODONE-ACETAMINOPHEN) Take 1 tablet by mouth four times a day  #120 x 0   Entered and Authorized by:   Julaine Fusi  DO   Signed by:   Julaine Fusi  DO on 10/26/2010   Method used:   Print then Give to Patient   RxID:   [id number] PERCOCET 10-325 MG TABS (OXYCODONE-ACETAMINOPHEN) Take 1 tablet by mouth four times a day  #120 x 0   Entered and Authorized by:   Julaine Fusi  DO   Signed by:   Julaine Fusi  DO on 10/26/2010   Method used:   Print then Give to Patient   RxID:   [id number] PERCOCET 10-325 MG TABS (OXYCODONE-ACETAMINOPHEN) Take 1 tablet by mouth four times a day  #120 x 0   Entered and Authorized by:   Julaine Fusi  DO   Signed by:   Julaine Fusi  DO on 10/26/2010   Method used:   Print then Give to Patient   RxID:   [id number]

## 2011-01-10 NOTE — Progress Notes (Signed)
Summary: meds/ hla  Phone Note Call from Patient   Summary of Call: pt' s called to ask about meds from visit then realized that she did have them, she states she will call back if there is indeed a ?. Initial call taken by: Marin Roberts RN,  June 28, 2010 5:13 PM     Appended Document: meds/ hla pt's spouse calls today and states they do not have script for HCTZ, called to pharm

## 2011-01-10 NOTE — Medication Information (Signed)
Summary: Tax adviser   Imported By: Florinda Marker 01/08/2010 15:55:57  _____________________________________________________________________  External Attachment:    Type:   Image     Comment:   External Document

## 2011-01-10 NOTE — Progress Notes (Signed)
Summary: refill/gg    Phone Note Refill Request  on June 04, 2010 1:04 PM  Refills Requested: Medication #1:  PERCOCET 7.5-325 MG TABS Take 1 tablet by mouth four times a day as needed pain   Last Refilled: 05/10/2010  Method Requested: Pick up at Office Initial call taken by: Merrie Roof RN,  June 07, 2010 4:49 PM  Follow-up for Phone Call        Refill approved-nurse to complete- will give 3 months Have him schedule regular check-up in the next 3 months too Follow-up by: Julaine Fusi  DO,  June 08, 2010 3:31 PM  Additional Follow-up for Phone Call Additional follow up Details #1::        Pt informed Rx is ready for pick up Additional Follow-up by: Merrie Roof RN,  June 12, 2010 10:21 AM     Prescriptions: PERCOCET 7.5-325 MG TABS (OXYCODONE-ACETAMINOPHEN) Take 1 tablet by mouth four times a day as needed pain  #120 x 0   Entered and Authorized by:   Julaine Fusi  DO   Signed by:   Julaine Fusi  DO on 06/08/2010   Method used:   Print then Give to Patient   RxID:   [id number]

## 2011-01-10 NOTE — Medication Information (Signed)
Summary: PERCOCET/  PERCOCET/   Imported By: Margie Billet 10/29/2010 15:38:38  _____________________________________________________________________  External Attachment:    Type:   Image     Comment:   External Document

## 2011-01-10 NOTE — Medication Information (Signed)
Summary: PERCOCET/  PERCOCET/   Imported By: Margie Billet 12/14/2010 14:50:10  _____________________________________________________________________  External Attachment:    Type:   Image     Comment:   External Document

## 2011-01-10 NOTE — Medication Information (Signed)
[facility]   Imported By: Margie Billet 04/23/2010 15:44:43  _____________________________________________________________________  External Attachment:    Type:   Image     Comment:   External Document

## 2011-01-10 NOTE — Medication Information (Signed)
Summary: PERCOCET   PERCOCET   Imported By: Margie Billet 05/15/2010 13:56:43  _____________________________________________________________________  External Attachment:    Type:   Image     Comment:   External Document

## 2011-01-16 NOTE — Assessment & Plan Note (Signed)
Summary: EST-CK/FU/MEDS/CFB   Vital Signs:  Patient profile:   60 year old male Temp:     98 degrees F (36.67 degrees C) oral Pulse rate:   76 / minute BP sitting:   136 / 85  (right arm)  Vitals Entered By: Angelina Ok RN (December 27, 2010 8:44 AM) CC: Depression Is Patient Diabetic? No Pain Assessment Patient in pain? yes     Location: right leg Intensity: 9 Type: aching, tightening Onset of pain  Constant  Have you ever been in a relationship where you felt threatened, hurt or afraid?No   Does patient need assistance? Functional Status Self care Ambulation Wheelchair Comments Pain in right  leg wakes him up.  Pt uses a wheelchair.  Needs refills.  Days that he feels weak and tired.  Not himself.   Primary Care Provider:  Julaine Fusi  DO  CC:  Depression.  History of Present Illness: Shawn [name] comes in today for routine follow-up. Major complaint is fatigue, increased pain secondary to UE OA and also in his right thigh thought to be due to muscle spasm. No FC, NVD.   Depression History:      The patient denies a depressed mood most of the day and a diminished interest in his usual daily activities.         Preventive Screening-Counseling & Management  Alcohol-Tobacco     Smoking Status: current     Smoking Cessation Counseling: yes     Packs/Day: 0.5     Year Quit: 08/2008     Passive Smoke Exposure: no  Current Medications (verified): 1)  Ambien Cr 12.5 Mg Cr-Tabs (Zolpidem Tartrate) .... Take 1 Tablet By Mouth Once A Day At Bedtime 2)  Prevacid 30 Mg Cpdr (Lansoprazole) .... Take 1 Tablet By Mouth Daily. 3)  Percocet 10-325 Mg Tabs (Oxycodone-Acetaminophen) .... Take 1 Tablet By Mouth Four Times A Day 4)  Ipratropium Bromide 0.06 % Soln (Ipratropium Bromide) .... One-Two Sprays in Each Nostril Three Times Daily As Directed 5)  Lidocaine-Hydrocortisone Ace 2-2 % Kit (Lidocaine-Hydrocortisone Ace) .... Apply Twice Daily As Directed 6)  Lisinopril 10 Mg Tabs  (Lisinopril) .... Take 1 Tablet By Mouth Once A Day 7)  Cyanocobalamin 1000 Mcg/ml Soln (Cyanocobalamin) .... Q Monthly Injection in Opc 8)  Hydrochlorothiazide 25 Mg Tabs (Hydrochlorothiazide) .... Take 1 Tablet By Mouth Once A Day 9)  Oxycontin 20 Mg Xr12h-Tab (Oxycodone Hcl) .... Take 1 Tablet By Mouth Two Times A Day 10)  Cyanocobalamin 1000 Mcg/ml Soln (Cyanocobalamin) .... Q Monthly Injection 11)  Voltaren 1 % Gel (Diclofenac Sodium) .... Apply Every 6 Hours As Needed For Pain  Allergies (verified): No Known Drug Allergies  Physical Exam  General:  alert and cooperative to examination. Paraplegic, sitting in wheelchair.  Lungs:  normal respiratory effort, normal breath sounds, no crackles, and no wheezes.   Heart:  normal rate, regular rhythm, no murmur, no gallop, and no rub.   Abdomen:  soft, non-tender, and bowel sounds hypoactive.   Msk:  no joint tenderness, no joint swelling, no joint warmth, and no redness over joints.  tenderness over right thigh but no signs of infection.    Impression & Recommendations:  Problem # 1:  CHRONIC PAIN SYNDROME (ICD-338.4) Will start him on oxycodone extended release today and regular percocet for breakthrough pain. Fatigue is an issue but we will see if improving his pain control helps this.   Problem # 2:  ROTATOR CUFF SYNDROME, RIGHT (ICD-726.10) Pain control as above.  Problem # 3:  ANEMIA (ICD-285.9) Will resume his B12 injections MMA normal but B12 borderline low. Not responding to dietary B12. On chronic PPI for GERD.  His updated medication list for this problem includes:    Cyanocobalamin 1000 Mcg/ml Soln (Cyanocobalamin) ..... Q monthly injection in opc    Cyanocobalamin 1000 Mcg/ml Soln (Cyanocobalamin) ..... Q monthly injection  Problem # 4:  HYPERTENSION, BENIGN ESSENTIAL (ICD-401.1) BP well controlled today with increase in his meds at his last visit. Will follow his renal function.  His updated medication list for this  problem includes:    Lisinopril 10 Mg Tabs (Lisinopril) .Marland Kitchen... Take 1 tablet by mouth once a day    Hydrochlorothiazide 25 Mg Tabs (Hydrochlorothiazide) .Marland Kitchen... Take 1 tablet by mouth once a day  BP today: 136/85 Prior BP: 182/91 (12/13/2010)  Labs Reviewed: K+: 4.0 (12/13/2010) Creat: : 0.64 (12/13/2010)   Chol: 117 (12/13/2010)   HDL: 27 (12/13/2010)   LDL: 73 (12/13/2010)   TG: 86 (12/13/2010)  Complete Medication List: 1)  Ambien Cr 12.5 Mg Cr-tabs (Zolpidem tartrate) .... Take 1 tablet by mouth once a day at bedtime 2)  Prevacid 30 Mg Cpdr (Lansoprazole) .... Take 1 tablet by mouth daily. 3)  Percocet 10-325 Mg Tabs (Oxycodone-acetaminophen) .... Take 1 tablet by mouth four times a day 4)  Ipratropium Bromide 0.06 % Soln (Ipratropium bromide) .... One-two sprays in each nostril three times daily as directed 5)  Procort 1.85-1.15 % Crea (Hydrocortisone ace-pramoxine) .... Apply as needed as directed 6)  Lisinopril 10 Mg Tabs (Lisinopril) .... Take 1 tablet by mouth once a day 7)  Cyanocobalamin 1000 Mcg/ml Soln (Cyanocobalamin) .... Q monthly injection in opc 8)  Hydrochlorothiazide 25 Mg Tabs (Hydrochlorothiazide) .... Take 1 tablet by mouth once a day 9)  Oxycontin 20 Mg Xr12h-tab (Oxycodone hcl) .... Take 1 tablet by mouth two times a day 10)  Cyanocobalamin 1000 Mcg/ml Soln (Cyanocobalamin) .... Q monthly injection 11)  Voltaren 1 % Gel (Diclofenac sodium) .... Apply every 6 hours as needed for pain  Other Orders: Capillary Blood Glucose/CBG (16109) Admin of Therapeutic Inj  intramuscular or subcutaneous (60454) Vit B12 1000 mcg (U9811)  Patient Instructions: 1)  Please schedule a follow-up appointment in 1-2 months. Prescriptions: OXYCONTIN 20 MG XR12H-TAB (OXYCODONE HCL) Take 1 tablet by mouth two times a day  #60 x 0   Entered and Authorized by:   Julaine Fusi  DO   Signed by:   Julaine Fusi  DO on 12/27/2010   Method used:   Printed then faxed to ...       [facility]  [phone]* (retail)       [address]       [place], [place]  [address]       Ph: [phone]       Fax: [phone]   RxID:   [id number] HYDROCHLOROTHIAZIDE 25 MG TABS (HYDROCHLOROTHIAZIDE) Take 1 tablet by mouth once a day  #30 x 10   Entered and Authorized by:   Julaine Fusi  DO   Signed by:   Julaine Fusi  DO on 12/27/2010   Method used:   Electronically to        [facility]. [phone]* (retail)       [address]       [place], [place]  [address]       Ph: [phone]  Fax: [phone]   RxID:   [id number] LISINOPRIL 10 MG TABS (LISINOPRIL) Take 1 tablet by mouth once a day  #90 Tablet x 10   Entered and Authorized by:   Julaine Fusi  DO   Signed by:   Julaine Fusi  DO on 12/27/2010   Method used:   Electronically to        [facility]. [phone]* (retail)       [address]       [place], [place]  [address]       Ph: [phone]       Fax: [phone]   RxID:   [id number] PREVACID 30 MG CPDR (LANSOPRAZOLE) take 1 tablet by mouth daily.  #90 Capsule x 4   Entered and Authorized by:   Julaine Fusi  DO   Signed by:   Julaine Fusi  DO on 12/27/2010   Method used:   Electronically to        [facility]. [phone]* (retail)       [address]       [place], [place]  [address]       Ph: [phone]       Fax: [phone]   RxID:   [id number] LIDOCAINE-HYDROCORTISONE ACE 2-2 % KIT (LIDOCAINE-HYDROCORTISONE ACE) Apply twice daily as directed  #1 tube x 3   Entered and Authorized by:   Julaine Fusi  DO   Signed by:   Julaine Fusi  DO on 12/27/2010   Method used:   Electronically to        [facility]. [phone]* (retail)       [address]       [place], [place]  [address]       Ph: [phone]       Fax: [phone]   RxID:   [id number] VOLTAREN 1 % GEL (DICLOFENAC SODIUM) Apply every 6 hours as needed for pain  #1 tube x 6   Entered and Authorized  by:   Julaine Fusi  DO   Signed by:   Julaine Fusi  DO on 12/27/2010   Method used:   Electronically to        [facility] [phone]* (retail)       [address]       [place], [place]  [address]       Ph: [phone]       Fax: [phone]   RxID:   [id number]    Medication Administration  Injection # 1:    Medication: Vit B12 1000 mcg    Diagnosis: VITAMIN B12 DEFICIENCY (ICD-266.2)    Route: IM    Site: R deltoid    Exp Date: 06/2012    Lot #: [id number]    Mfr: [employer]    Patient tolerated injection without complications    Given by: Angelina Ok RN (December 27, 2010 10:25 AM)  Orders Added: 1)  Capillary Blood Glucose/CBG [82948] 2)  Admin of Therapeutic Inj  intramuscular or subcutaneous [96372] 3)  Vit B12 1000 mcg [J3420] 4)  Est. Patient Level IV [82505]    Prevention & Chronic Care Immunizations   Influenza vaccine: Fluvax MCR  (08/02/2010)    Tetanus booster: Not documented    Pneumococcal vaccine: Not documented    H. zoster  vaccine: Not documented  Colorectal Screening   Hemoccult: Not documented    Colonoscopy: diverticular disease, normal  (07/10/2007)   Colonoscopy due: 07/09/2012  Other Screening   PSA: 0.61  (07/27/2008)   Smoking status: current  (12/27/2010)   Smoking cessation counseling: yes  (12/27/2010)  Lipids   Total Cholesterol: 117  (12/13/2010)   LDL: 73  (12/13/2010)   LDL Direct: Not documented   HDL: 27  (12/13/2010)   Triglycerides: 86  (12/13/2010)    SGOT (AST): 10  (06/26/2010)   SGPT (ALT): 17  (06/26/2010)   Alkaline phosphatase: 66  (06/26/2010)   Total bilirubin: 0.3  (06/26/2010)  Hypertension   Last Blood Pressure: 136 / 85  (12/27/2010)   Serum creatinine: 0.64  (12/13/2010)   Serum potassium 4.0  (12/13/2010)  Self-Management Support :   Personal Goals (by the next clinic visit) :      Personal blood pressure goal: 140/90  (08/02/2010)     Personal LDL goal:  100  (08/02/2010)    Patient will work on the following items until the next clinic visit to reach self-care goals:     Medications and monitoring: take my medicines every day, bring all of my medications to every visit  (12/27/2010)     Eating: drink diet soda or water instead of juice or soda, eat more vegetables, use fresh or frozen vegetables, eat foods that are low in salt, eat baked foods instead of fried foods, eat fruit for snacks and desserts, limit or avoid alcohol  (12/27/2010)    Hypertension self-management support: Written self-care plan, Education handout, Pre-printed educational material, Resources for patients handout  (12/27/2010)   Hypertension self-care plan printed.   Hypertension education handout printed    Lipid self-management support: Written self-care plan, Education handout, Pre-printed educational material, Resources for patients handout  (12/27/2010)   Lipid self-care plan printed.   Lipid education handout printed      Resource handout printed.   Nursing Instructions: Need B12 Shot    Medication Administration  Injection # 1:    Medication: Vit B12 1000 mcg    Diagnosis: VITAMIN B12 DEFICIENCY (ICD-266.2)    Route: IM    Site: R deltoid    Exp Date: 06/2012    Lot #: [id number]    Mfr: [employer]    Patient tolerated injection without complications    Given by: Angelina Ok RN (December 27, 2010 10:25 AM)  Orders Added: 1)  Capillary Blood Glucose/CBG [82948] 2)  Admin of Therapeutic Inj  intramuscular or subcutaneous [96372] 3)  Vit B12 1000 mcg [J3420] 4)  Est. Patient Level IV [06237]

## 2011-01-16 NOTE — Progress Notes (Signed)
Summary: med change/gp  Phone Note Refill Request Message from:  Fax from Pharmacy on January 01, 2011 9:39 AM  Refills Requested: Medication #1:  LIDOCAINE-HYDROCORTISONE ACE 2-2 % KIT Apply twice daily as directed  This medication has been discontinued by the manufacture; can you prescribe an alternative?  Thanks   Method Requested: Electronic Initial call taken by: Chinita Pester RN,  January 01, 2011 9:39 AM  Follow-up for Phone Call        Changed. Follow-up by: Julaine Fusi  DO,  January 07, 2011 11:52 AM

## 2011-01-16 NOTE — Progress Notes (Signed)
Summary: Medication change  Phone Note Refill Request Message from:  Fax from Pharmacy on January 04, 2011 12:05 PM  Refills Requested: Medication #1:  LIDOCAINE-HYDROCORTISONE ACE 2-2 % KIT Apply twice daily as directed Medication has been discontinued by the manufacture.  Pharmacy would like an alternative.   Method Requested: Fax to Local Pharmacy Initial call taken by: Angelina Ok RN,  January 04, 2011 12:06 PM    New/Updated Medications: PROCORT 1.85-1.15 % CREA (HYDROCORTISONE ACE-PRAMOXINE) Apply as needed as directed Prescriptions: PROCORT 1.85-1.15 % CREA (HYDROCORTISONE ACE-PRAMOXINE) Apply as needed as directed  #1 tube x 3   Entered and Authorized by:   Julaine Fusi  DO   Signed by:   Julaine Fusi  DO on 01/07/2011   Method used:   Electronically to        [facility]. [phone]* (retail)       [address]       [place], [place]  [address]       Ph: [phone]       Fax: [phone]   RxID:   [id number]

## 2011-01-28 ENCOUNTER — Ambulatory Visit (INDEPENDENT_AMBULATORY_CARE_PROVIDER_SITE_OTHER): Payer: BC Managed Care – PPO | Admitting: *Deleted

## 2011-01-28 DIAGNOSIS — E538 Deficiency of other specified B group vitamins: Secondary | ICD-10-CM

## 2011-01-28 MED ORDER — CYANOCOBALAMIN 1000 MCG/ML IJ SOLN
1000.0000 ug | INTRAMUSCULAR | Status: DC
  Administered 2011-01-28 – 2011-02-28 (×2): 1000 ug via INTRAMUSCULAR

## 2011-02-28 ENCOUNTER — Ambulatory Visit (INDEPENDENT_AMBULATORY_CARE_PROVIDER_SITE_OTHER): Payer: BC Managed Care – PPO | Admitting: *Deleted

## 2011-02-28 DIAGNOSIS — E538 Deficiency of other specified B group vitamins: Secondary | ICD-10-CM

## 2011-03-04 LAB — DIFFERENTIAL
Basophils Relative: 0 % (ref 0–1)
Eosinophils Absolute: 0 10*3/uL (ref 0.0–0.7)
Eosinophils Relative: 0 % (ref 0–5)
Monocytes Absolute: 0.7 10*3/uL (ref 0.1–1.0)
Monocytes Relative: 7 % (ref 3–12)

## 2011-03-04 LAB — URINALYSIS, ROUTINE W REFLEX MICROSCOPIC
Bilirubin Urine: NEGATIVE
Ketones, ur: NEGATIVE mg/dL
Nitrite: POSITIVE — AB
Urobilinogen, UA: 1 mg/dL (ref 0.0–1.0)

## 2011-03-04 LAB — CBC
HCT: 41.2 % (ref 39.0–52.0)
Hemoglobin: 14.5 g/dL (ref 13.0–17.0)
MCHC: 35.3 g/dL (ref 30.0–36.0)
MCV: 87.9 fL (ref 78.0–100.0)
RBC: 4.68 MIL/uL (ref 4.22–5.81)
WBC: 10.2 10*3/uL (ref 4.0–10.5)

## 2011-03-04 LAB — URINE CULTURE

## 2011-03-04 LAB — POCT I-STAT, CHEM 8
Calcium, Ion: 1.03 mmol/L — ABNORMAL LOW (ref 1.12–1.32)
Glucose, Bld: 108 mg/dL — ABNORMAL HIGH (ref 70–99)
HCT: 44 % (ref 39.0–52.0)
Hemoglobin: 15 g/dL (ref 13.0–17.0)

## 2011-03-04 LAB — URINE MICROSCOPIC-ADD ON

## 2011-03-11 LAB — CULTURE, BLOOD (ROUTINE X 2): Culture: NO GROWTH

## 2011-03-11 LAB — CULTURE, ROUTINE-ABSCESS
Culture: NO GROWTH
Gram Stain: NONE SEEN

## 2011-03-11 LAB — BASIC METABOLIC PANEL
BUN: 11 mg/dL (ref 6–23)
CO2: 24 mEq/L (ref 19–32)
GFR calc non Af Amer: >60 mL/min (ref >60)
Glucose, Bld: 96 mg/dL (ref 70–99)
Potassium: 3.8 mEq/L (ref 3.5–5.1)

## 2011-03-11 LAB — CBC
HCT: 38.5 % — ABNORMAL LOW (ref 39.0–52.0)
Hemoglobin: 13.4 g/dL (ref 13.0–17.0)
MCHC: 34.7 g/dL (ref 30.0–36.0)
MCV: 90.3 fL (ref 78.0–100.0)
Platelets: 362 10*3/uL (ref 150–400)
RDW: 15.2 % (ref 11.5–15.5)

## 2011-03-11 LAB — DIFFERENTIAL
Basophils Absolute: 0.1 10*3/uL (ref 0.0–0.1)
Basophils Relative: 1 % (ref 0–1)
Eosinophils Absolute: 0.2 10*3/uL (ref 0.0–0.7)
Eosinophils Relative: 2 % (ref 0–5)
Lymphocytes Relative: 28 % (ref 12–46)
Monocytes Absolute: 0.8 10*3/uL (ref 0.1–1.0)

## 2011-03-25 LAB — CBC
HCT: 33 % — ABNORMAL LOW (ref 39.0–52.0)
HCT: 34.3 % — ABNORMAL LOW (ref 39.0–52.0)
HCT: 36.5 % — ABNORMAL LOW (ref 39.0–52.0)
HCT: 41.2 % (ref 39.0–52.0)
Hemoglobin: 11 g/dL — ABNORMAL LOW (ref 13.0–17.0)
Hemoglobin: 11 g/dL — ABNORMAL LOW (ref 13.0–17.0)
Hemoglobin: 12 g/dL — ABNORMAL LOW (ref 13.0–17.0)
Hemoglobin: 12.1 g/dL — ABNORMAL LOW (ref 13.0–17.0)
Hemoglobin: 13.2 g/dL (ref 13.0–17.0)
MCHC: 32 g/dL (ref 30.0–36.0)
MCV: 83.6 fL (ref 78.0–100.0)
MCV: 85.5 fL (ref 78.0–100.0)
Platelets: 301 10*3/uL (ref 150–400)
Platelets: 304 10*3/uL (ref 150–400)
RBC: 4.02 MIL/uL — ABNORMAL LOW (ref 4.22–5.81)
RBC: 4.39 MIL/uL (ref 4.22–5.81)
RBC: 4.41 MIL/uL (ref 4.22–5.81)
RDW: 21.8 % — ABNORMAL HIGH (ref 11.5–15.5)
RDW: 23 % — ABNORMAL HIGH (ref 11.5–15.5)
WBC: 11 10*3/uL — ABNORMAL HIGH (ref 4.0–10.5)
WBC: 15.7 10*3/uL — ABNORMAL HIGH (ref 4.0–10.5)
WBC: 19.1 10*3/uL — ABNORMAL HIGH (ref 4.0–10.5)

## 2011-03-25 LAB — BASIC METABOLIC PANEL
BUN: 3 mg/dL — ABNORMAL LOW (ref 6–23)
BUN: 4 mg/dL — ABNORMAL LOW (ref 6–23)
CO2: 25 mEq/L (ref 19–32)
CO2: 27 mEq/L (ref 19–32)
Calcium: 8.3 mg/dL — ABNORMAL LOW (ref 8.4–10.5)
Calcium: 8.3 mg/dL — ABNORMAL LOW (ref 8.4–10.5)
Chloride: 110 mEq/L (ref 96–112)
Creatinine, Ser: 0.6 mg/dL (ref 0.4–1.5)
GFR calc Af Amer: >60 mL/min (ref >60)
GFR calc non Af Amer: >60 mL/min (ref >60)
GFR calc non Af Amer: >60 mL/min (ref >60)
GFR calc non Af Amer: >60 mL/min (ref >60)
Glucose, Bld: 111 mg/dL — ABNORMAL HIGH (ref 70–99)
Glucose, Bld: 133 mg/dL — ABNORMAL HIGH (ref 70–99)
Potassium: 3.1 mEq/L — ABNORMAL LOW (ref 3.5–5.1)
Sodium: 135 mEq/L (ref 135–145)
Sodium: 140 mEq/L (ref 135–145)
Sodium: 141 mEq/L (ref 135–145)

## 2011-03-25 LAB — GIARDIA/CRYPTOSPORIDIUM SCREEN(EIA): Cryptosporidium Screen (EIA): NEGATIVE

## 2011-03-25 LAB — CROSSMATCH: Antibody Screen: NEGATIVE

## 2011-03-25 LAB — URINALYSIS, ROUTINE W REFLEX MICROSCOPIC
Ketones, ur: 15 mg/dL — AB
Nitrite: POSITIVE — AB
Specific Gravity, Urine: 1.024 (ref 1.005–1.030)
Urobilinogen, UA: 0.2 mg/dL (ref 0.0–1.0)
pH: 6 (ref 5.0–8.0)

## 2011-03-25 LAB — APTT: aPTT: 38 seconds — ABNORMAL HIGH (ref 24–37)

## 2011-03-25 LAB — LACTIC ACID, PLASMA: Lactic Acid, Venous: 1 mmol/L (ref 0.5–2.2)

## 2011-03-25 LAB — MAGNESIUM: Magnesium: 2.1 mg/dL (ref 1.5–2.5)

## 2011-03-25 LAB — PREALBUMIN: Prealbumin: 10.3 mg/dL — ABNORMAL LOW (ref 18.0–45.0)

## 2011-03-25 LAB — CLOSTRIDIUM DIFFICILE EIA

## 2011-03-25 LAB — LACTATE DEHYDROGENASE: LDH: 95 U/L (ref 94–250)

## 2011-03-25 LAB — CULTURE, BLOOD (ROUTINE X 2)
Culture: NO GROWTH
Culture: NO GROWTH

## 2011-03-25 LAB — ALBUMIN: Albumin: 2.5 g/dL — ABNORMAL LOW (ref 3.5–5.2)

## 2011-03-25 LAB — URINE MICROSCOPIC-ADD ON

## 2011-04-01 ENCOUNTER — Other Ambulatory Visit: Payer: Self-pay | Admitting: *Deleted

## 2011-04-01 ENCOUNTER — Ambulatory Visit: Payer: BC Managed Care – PPO

## 2011-04-01 ENCOUNTER — Ambulatory Visit (INDEPENDENT_AMBULATORY_CARE_PROVIDER_SITE_OTHER): Payer: BC Managed Care – PPO | Admitting: *Deleted

## 2011-04-01 DIAGNOSIS — E538 Deficiency of other specified B group vitamins: Secondary | ICD-10-CM

## 2011-04-01 MED ORDER — OXYCODONE-ACETAMINOPHEN 10-325 MG PO TABS: 1.0000 | ORAL_TABLET | Freq: Four times a day (QID) | ORAL | Status: DC

## 2011-04-01 MED ORDER — CYANOCOBALAMIN 1000 MCG/ML IJ SOLN
1000.0000 ug | Freq: Once | INTRAMUSCULAR | Status: AC
  Administered 2011-04-01: 1000 ug via INTRAMUSCULAR

## 2011-04-01 NOTE — Telephone Encounter (Signed)
I don't see any refills since January??? Please recheck the meds I put in for refill.

## 2011-04-01 NOTE — Telephone Encounter (Signed)
I will write scripts for these this afternoon

## 2011-04-01 NOTE — Telephone Encounter (Signed)
Scripts given to patient today. 3 post dated. Reliable Patient, chronic pain documented. OA shoulders due to paraplegia UE overuse.
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
## 2011-04-23 NOTE — Op Note (Signed)
Shawn [name], Shawn [name] NO.:  [id number]   MEDICAL RECORD NO.:  [id number]          PATIENT TYPE:  INP   LOCATION:  5028                         FACILITY:  [facility]   PHYSICIAN:  Loreta Ave, MD DATE OF BIRTH:  1951/02/07   DATE OF PROCEDURE:  10/31/2008  DATE OF DISCHARGE:                               OPERATIVE REPORT   PREOPERATIVE DIAGNOSIS:  Stage IV right ischial pressure sore.   POSTOPERATIVE DIAGNOSIS:  Stage IV right ischial pressure sore.   PROCEDURE PERFORMED:  Excision of right ischial pressure ulcer,  including ischial bone and readvancement of hamstring V-Y myocutaneous  flap.   IV FLUIDS:  1750 mL of crystalloid.   URINE OUTPUT:  775 mL.   ESTIMATED BLOOD LOSS:  200 mL.   COMPLICATIONS:  None.   DRAINS:  Jackson-Pratt x2.   CLINICAL INDICATION:  Shawn [name] is a 60 year old African American  gentleman with a long history of paraplegia.  He has had multiple  pressure ulcers including a right ischial pressure ulcer and sacral  pressure ulcer in the past.  Previously, his right ischial pressure  ulcer was closed with a hamstring V-Y advancement myocutaneous flap.   After a discussion of the risks and benefits of debridement and closure  with a myocutaneous flap, which include but are not limited to bleeding,  infection, damage to the nearby structures, recurrence of the wound,  partial or complete flap loss, and the need for future surgery, Shawn [name]  understood these risks and desired to proceed.   DESCRIPTION OF PROCEDURE:  The patient was brought to the operating room  and placed in the supine position on the operating room table.  After  smooth and routine induction of general anesthesia, the patient was  transferred back to a stretcher in the supine position.  He was then  rolled prone onto shoulder pads on the operating room table.  All bony  prominences were well padded.  Care was taken to make sure he was not  resting on his penis  or his Foley catheter.  The right ischial wound was  prepped with Betadine and draped into a sterile field from the level of  the posterior superior iliac spine to the level of right knee.  Lidocaine 20 mL of 1% with 1:100,000 epinephrine were injected around  his ischial pressure ulcer, which measured 5 x 9 x 3 cm.  The skin was  incised sharply and the pressure ulcer was excised with electrocautery.  The ischium at the base of the wound was covered with granulation  tissue.  The bone and the granulation tissue were curetted down to an  even base- the bone was hard and not clinically suggestive of harboring  osteomyelitis, thus bone cultures were not taken.  Bony prominences were  removed with rongeurs and smoothed with curettes.  It was difficult to  obtain hemostasis with electrocautery on the bone, so thrombin spray was  applied topically to the wound to obtain complete hemostasis.   At this point, the wound was inspected and coverage needs were  ascertained.  It was  felt that readvancement of the V-Y flap would be  the least morbid method of closing this wound given its likelihood for  recurrence.  This would leave the additional option of gluteus maximus  rotation flap in the future, for the sacrum or ischium, as this muscle  has not been used previously.  Lidocaine 15 mL of 1% with 100,000  epinephrine were injected subcutaneously along the previous scars on the  patient's posterior side.  After allowing the hemostatic effects of the  epinephrine to take effect, the skin was incised sharply with a 10  blade.  Dissection was carried down to the level of the hamstring  muscles with electrocautery.  The proximal extent of the previously  advanced muscle flap was somewhat fibrous and had some heterotopic  ossification.  The fibrous bands were released with electrocautery, and  heterotopic ossification was removed with rongeur.  After removing these  bands, there was found that the flap  advanced into the defect in a  tension-free manner providing a soft tissue coverage for the entire  wound without dead space.  The entire wound was irrigated copiously with  normal saline.  The pressure ulcer was pulse lavage with 3 L of normal  saline.  Hemostasis was again verified and obtained with electrocautery.  Two 19-French round Blake drains were placed via separate stab  incisions, one at the inferior lateral portion of the thigh incision and  one just inferior to the trochanter.  These were secured in place with  solk skin sutures.  The deep surface of the flap was then advanced  superiorly and sutured to the soft tissue at the superior aspect of the  defect.  These deep sutures were of 0 PDS and were placed in figure-of-  eight, buried, interrupted fashion.  After advancing the flap to cover  the ischium, a layered closure was performed with 2-0 Vicryl in the  fascia of the hamstring muscles.  The skin was then closed with  interrupted, 2-0 nylon vertical mattress sutures placed approximately  every 3 cm.  Interrupted buried 3-0 Monocryl sutures were placed in the  dermis between the vertical mattress sutures.  The needle count was  reported as being incorrect; therefore, 2 x-rays of the entire field  where the operation were taken to rule out leaving a retained needle in  the patient's wound.  These were reported as not containing a needle by  Radiology, a finding which I verified via x-ray inspection myself  intraoperatively.  Dermabond was applied to the incisions, and the  patient was rolled into the prone position and extubated without  complication.  He was then transported to the recovery room in good  condition.      Loreta Ave, MD  Electronically Signed     CF/MEDQ  D:  10/31/2008  T:  10/31/2008  Job:  [id number]
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
## 2011-04-23 NOTE — Op Note (Signed)
NAMEMAKENA, MCGRADY NO.:  [id number]   MEDICAL RECORD NO.:  [id number]          PATIENT TYPE:  INP   LOCATION:  5022                         FACILITY:  [facility]   PHYSICIAN:  Leonie Man, M.D.   DATE OF BIRTH:  [birth year]   DATE OF PROCEDURE:  11/13/2007  DATE OF DISCHARGE:                               OPERATIVE REPORT   PREOPERATIVE DIAGNOSIS:  Right hip decubitus ulcer.   POSTOPERATIVE DIAGNOSIS:  Right hip decubitus ulcer.   PROCEDURE:  Debridement and lavage of right hip ulcer with VAC  application.   SURGEON:  Leonie Man, M.D.   ASSISTANT:  OR nurse.   ANESTHESIA:  General.   Shawn [name] is a 60 year old paraplegic man who has done well over the past  30-odd years.  He has had multiple ulcers and infections before.  He  presents now with a very large ulcer overlying the right hip, extending  up into the gluteal region.  This has been being treated with pulse  lavage.  The patient comes to the operating room now for further  operative debridement and opening up of the wound so that a vacuum  device can be placed.   He understands the risks and potential benefits of surgery and gives his  consent.   PROCEDURE:  The patient is positioned in the lateral recumbent position  after the induction of satisfactory general anesthesia, the wound is  opened up by about 6 inches superiorly up towards the greater  trochanter, where there end of the wound is.  We then used the pulse  lavage to completely wash the wound.  The base of the wound is a clean  surface.  I then inserted the vacuum sponge and trimmed it to the  appropriate size and covered it with the plastic occlusive dressing.  I  then applied the vacuum to this.  The anesthetic was then reversed, the  patient removed from the operating room to the recovery room in stable  condition.  He tolerated the procedure well.      Leonie Man, M.D.  Electronically Signed     PB/MEDQ  D:   11/13/2007  T:  11/14/2007  Job:  [id number]
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
## 2011-04-23 NOTE — Letter (Signed)
June 17, 2007    Beatrix Fetters, M.D.  [address] [place], [place] [address]   RE:  CHANAN, DETWILER  MRN:  [id number]  /  DOB:  23-Oct-1951   Dear Dr. Kathyrn Sheriff:   Upon your kind referral, I had the pleasure of evaluating your patient  and I am pleased to offer my findings.  I saw [name] in the  office today.  Enclosed is a copy of my progress note that details my  findings and recommendations.   Thank you for the opportunity to participate in your patient's care.    Sincerely,      Barbette Hair. Arlyce Dice, MD,FACG  Electronically Signed    RDK/MedQ  DD: 06/17/2007  DT: 06/17/2007  Job #: [id number]

## 2011-04-26 NOTE — Op Note (Signed)
[place]  Patient:    Shawn [name], Shawn [name] Visit Number: [id number] MRN: [id number]          Service Type: SUR Location: [id number] Attending Physician:  Loura Halt Ii Proc. Date: 07/31/01 Adm. Date:  07/31/2001                             Operative Report  PREOPERATIVE DIAGNOSIS:  Stage III 5 cm sacral pressure sore.  POSTOPERATIVE DIAGNOSIS:  Stage III 5 cm sacral pressure sore.  OPERATION PERFORMED: 1. Debridement of sacral pressure sore. 2. Left gluteus maximus fasciocutaneous rotation advancement flap for    closure of sacral defect.  SURGEON:  Alfredia Ferguson, M.D.  ASSISTANT:  Teena Irani. Odis Luster, M.D.  ANESTHESIA:  General endotracheal.  INDICATIONS FOR PROCEDURE:  The patient is a 60 year old gentleman who has been an L5 paraplegic for 29 years.  He developed a small sacral pressure sore some months back.  There has been no progress towards healing.  As a matter of fact, the ulcer has been enlarging.  There is no communication with the bone at this time.  The ulcer is 5 cm in diameter.  The patient wishes to have the ulcer debrided and closed.  He understands the risk of surgery including bleeding, infection, dehiscence of the wound and inability to close the ulcer completely.  In spite of these risks, the patient wishes to proceed with the operation.  DESCRIPTION OF PROCEDURE:  The patient was given general endotracheal anesthesia, then rolled into a prone position with all pressure points well padded.  The buttock area was prepped and draped in sterile fashion.  10 cc of 0.5% Marcaine with 1:200,000 epinephrine was infiltrated in the vicinity of the ulcer.  The ulcer was completely excised, removing about 1 cm of the surrounding skin edges which were rolled into a thick scar.  The ulcer was debrided down to the sacral fascia.  Hemostasis was maintained throughout using electrocautery.  A left-sided gluteus maximus  fasciocutaneous flap was incised by beginning the incision at the 12 oclock position of the ulcer defect and carrying the incision superiorly for several centimeters, then turning left in a curvilinear fashion until reaching the lateral trochanteric area.  The flap was elevated at the level of the gluteus fascia with the flap including the fascia of the gluteus maximus muscle.  The flap was elevated to the point where the flap would rotate and advance into the defect without tension.  At this point the dissection was terminated.  The wound was now copiously irrigated with saline irrigation.  Hemostasis was assured using electrocautery.  Two 10 mm Blake drains were placed in the wounds and brought out through separate stab incision.  The flap was advanced and rotated into the defect with a 0 Vicryl suture used as a tension holding suture placed laterally to secure the advancement.  Several other 0 Vicryl sutures were placed in the dermis to augment this first suture.  In the intervening spaces between these sutures, 2-0 Vicryl suture was used to unit the dermis.  Once the entire flap had been advanced, rotated and fixed in position, skin staples were used for the skin edges.  The appearance of the flap was excellent with bleeding that came from the cut edges of the distal point of the flap.  The patient tolerated the procedure well with minimal blood loss.  The buttocks area was cleansed  with Betadine, dried and a bulky dressing was applied.  The patient was rolled into supine position, extubated and transported to the recovery room on an air fluidized bed. Attending Physician:  Loura Halt Ii DD:  07/31/01 TD:  08/03/01 Job: [id number]

## 2011-04-26 NOTE — Discharge Summary (Signed)
NAMEFABIEN, TRAVELSTEAD NO.:  [id number]   MEDICAL RECORD NO.:  [id number]          PATIENT TYPE:  INP   LOCATION:  3012                         FACILITY:  [facility]   PHYSICIAN:  Payton Doughty, M.D.      DATE OF BIRTH:  09-Mar-1951   DATE OF ADMISSION:  02/28/2006  DATE OF DISCHARGE:  03/09/2006                                 DISCHARGE SUMMARY   ADMISSION DIAGNOSIS:  Cervical spondylosis from C3-T1.   DISCHARGE DIAGNOSIS:  Cervical spondylosis from C3-T1.   PROCEDURE:  C4-C5, C5-C6, C6-C7 corpectomies and anterior fusion and C3-T1  posterior fusion.  Surgeon Dr. Channing Mutters, neurosurgery.  No complications.   HOSPITAL COURSE:  This is a 60 year old gentleman with History and Physical  recounted in the chart.  He is a chronic T6 paraplegic who is developing  quadriparesis secondary to spondylosis at C3-C4, C4-C5, C5-C6, C6-C7 and C7-  T1.  He was admitted after ascertaining normal laboratory values and  underwent a corpectomy of C4, C5, C6 and C7, placement of a strut graft and  plate from D6-U4 and then posterior fusion from C3-T1.  Postoperatively, he  remained intubated for several days.  As his swelling diminished, the tube  was removed.  He was kept back in the ICU until his airway was safe and he  was able to do his transfers to his wheelchair independently.  He has been  transferred to the floor for days.  He has been up and about.  He is eating  and voiding normally.  His arm strength is markedly improved.  His hand  strength is full.  He is being discharged home in the care of his family.   DISCHARGE MEDICATIONS:  Vicodin for pain.   FOLLOW UP:  Follow up in the [facility] office next  week for sutures.           ______________________________  Payton Doughty, M.D.     MWR/MEDQ  D:  03/09/2006  T:  03/11/2006  Job:  [id number]
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
## 2011-04-26 NOTE — Discharge Summary (Signed)
Shawn Shawn, Shawn NO.:  [id number]   MEDICAL RECORD NO.:  [id number]          PATIENT TYPE:  INP   LOCATION:  1339                         FACILITY:  [facility]   PHYSICIAN:  Barbette Hair. Arlyce Dice, MD,FACGDATE OF BIRTH:  06-May-1951   DATE OF ADMISSION:  07/02/2007  DATE OF DISCHARGE:  07/03/2007                               DISCHARGE SUMMARY   ADMISSION DIAGNOSES:  24. A 60 year old, African-American male, paraplegic, admitted for      bowel prep and colonoscopy.  2. Hematochezia and rectal pain, rule out hemorrhoidal disease versus      occult lesion.   DISCHARGE DIAGNOSES:  1. Hematochezia and rectal discomfort secondary to internal      hemorrhoid.  34. A 60 year old, African-American male, paraplegic, admitted for      bowel prep and colonoscopy.  3. Hematochezia and rectal pain, rule out hemorrhoidal disease versus      occult lesion.   CONSULTATIONS:  None.   PROCEDURE:  Colonoscopy per Dr. Arlyce Dice.   HISTORY OF PRESENT ILLNESS:  Shawn Shawn is a 60 year old, African-American  male, paraplegic who presented with complaints of intermittent rectal  bleeding and rectal discomfort.  He had no complaints of abdominal pain.  He chronically requires anal dilation for regular bowel movements.  It  was not felt that he could adequately prep at home given his paraplegia  and was admitted to the hospital for bowel prep and then colonoscopy.   LABORATORY DATA AND X-RAY FINDINGS:  CBC on admission with hemoglobin  15.7, hematocrit 44.4, WBC 8.1, platelets 266.   HOSPITAL COURSE:  The patient was admitted to the service of Dr. Melvia Heaps.  He underwent bowel prep on the evening of admission and  tolerated this without difficulty and the following morning underwent  colonoscopy with Dr. Arlyce Dice.  Fortunately, this was an essentially  negative exam.  He did have some small internal hemorrhoids and left  colon diverticuli.  He was discharged to home post procedure in  stable  condition.  He was given Anusol HC suppositories to use x10 days nightly  and then p.r.n. basis thereafter.  Other medications same as on  admission.   DISCHARGE MEDICATIONS:  1. Clonidine.  2. Hydrochlorothiazide.  3. Prevacid.  4. Zyrtec.  5. Gemfibrozil.  6. Benazepril.  7. Amitriptyline.      Amy Esterwood, PA-C      Robert D. Arlyce Dice, MD,FACG  Electronically Signed    AE/MEDQ  D:  07/14/2007  T:  07/14/2007  Job:  [id number]

## 2011-04-26 NOTE — H&P (Signed)
[name], Shawn [name] NO.:  [id number]   MEDICAL RECORD NO.:  [id number]          PATIENT TYPE:  INP   LOCATION:  3107                         FACILITY:  [facility]   PHYSICIAN:  Payton Doughty, M.D.      DATE OF BIRTH:  [birth year]   DATE OF ADMISSION:  02/28/2006  DATE OF DISCHARGE:                                HISTORY & PHYSICAL   ADMITTING DIAGNOSIS:  Cervical spondylosis with myelopathy.   SERVICE:  Neurosurgery   A very nice 60 year old right-handed black gentleman has been a T5  paraplegic for 33 years. He has been doing well but has noticed over the  past 3 months weakness in his hands and a lot of pain over his shoulders. On  MR was found to have severe spondylosis, sent to me, and he is now admitted  for an anterior decompression and fusion/corpectomy from C3-T1 and a  posterior stabilization.   MEDICAL HISTORY:  Remarkable for hypertension and reflux.   He takes:  1.  Vicodin on a p.r.n. basis.  2.  Reglan 5 mg with meals.  3.  Benazepril once a day.  4.  Hydrochlorothiazide 25 mg a day.  5.  Gemfibrozil once a day.  6.  Prevacid 30 mg once a day.  7.  Clonidine 2 mg twice a day.  8.  Zyrtec 10 mg a day.   ALLERGIES:  IVP DYE.   SURGICAL HISTORY:  Pressure sores three times in the past 33 years, most  recently in 2001.   SOCIAL HISTORY:  He smokes a half a pack of cigarettes a day, does not drink  alcohol, and is obviously on disability.   FAMILY HISTORY:  Parents are deceased, history is not given.   REVIEW OF SYSTEMS:  Remarkable for night sweats, cataracts, tinnitus, nasal  congestion, sinus problems, hypertension, hypercholesterolemia, swelling in  the hands and feet, shortness of breath, nausea, gastritis, arm weakness,  leg weakness, arm pain, leg pain, joint pain, arthritis, and neck pain.   PHYSICAL EXAMINATION:  HEENT:  Within normal limits.  NECK:  He has limited range of motion of his neck.  CHEST:  Clear.  CARDIAC:  Regular rate  and rhythm.  ABDOMEN:  Somewhat large but nontender. No hepatosplenomegaly, normal bowel  sounds.  EXTREMITIES:  Upper extremities are normal size. Lower extremities are quite  wasted related to his paraplegia.  GENITOURINARY:  Deferred.  PERIPHERAL PULSES:  Good.  NEUROLOGIC:  He is awake, alert, and oriented. His cranial nerves are  intact. Motor exam shows 5/5 strength in the deltoid, biceps, and triceps.  His intrinsics and grip are 4/5. Sensation is actually not diminished at  this time. Reflexes are 1 at the biceps and triceps, absent at the  brachioradialis. His Hoffman's is negative.   MR demonstrates severe spondylosis with reversal of normal cervical lordosis  and compression of the cord at 3-4, 4-5, 5-6, 6-7, and 7-1.   CLINICAL IMPRESSION:  Severe cervical spondylosis and myelopathy. Obviously  needs to be fixed. Originally was planned for decompression and fusion, but  the plan  now is for a corpectomy after review of his film several times.  Corpectomy will be at C4, C5, C6, and C7, placing an interbody strut,  anterior plate from X9-J4, and then placement of posterior instrumentation  at 3, 4, 5, 6, 7, and 1 with BMP. The risks and benefits of this approach  have been discussed with him and he wishes to proceed.           ______________________________  Payton Doughty, M.D.     MWR/MEDQ  D:  02/28/2006  T:  03/01/2006  Job:  [id number]
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
## 2011-04-26 NOTE — Op Note (Signed)
NAMEJAYDENN, Shawn [name] NO.:  [id number]   MEDICAL RECORD NO.:  [id number]          PATIENT TYPE:  INP   LOCATION:  3107                         FACILITY:  [facility]   PHYSICIAN:  Payton Doughty, M.D.      DATE OF BIRTH:  15-Jul-1951   DATE OF PROCEDURE:  DATE OF DISCHARGE:                                 OPERATIVE REPORT   PREOPERATIVE DIAGNOSIS:  Spondylosis myelopathy at C3-4, C4-5, C5-6, C6-7,  and C7-T1.   POSTOPERATIVE DIAGNOSIS:  Spondylosis myelopathy at C3-4, C4-5, C5-6, C6-7,  and C7-T1.   OPERATIVE PROCEDURES:  1.  C4, C5, C6, C7 vertebrectomy.  2.  C3-T1 anterior cervical decompression.  3.  Arthrodesis with a P cage posterior segmental fixation from C3-T1 and      posterior lateral arthrodesis with bone-morphogenic protein.   SURGEON:  Payton Doughty, M.D.   ANESTHESIA:  General endotracheal.   PREPARATION:  Prepped with an alcohol wipe.   COMPLICATIONS:  None.   NURSE ASSISTANT:  [place].   DOCTOR ASSISTANT:  Cristi Loron, M.D.   This is a 60 year old right-hand black gentleman with severe cervical  spondylitic myelopathy and cord compression.  He is taken to the operating  room and intubated, placed supine on the Stryker bed.  Following shave,  prep, and drape in the usual sterile fashion, the skin is incised from the  sternal notch to the angle of the jaw paralleling sternocleidomastoid  muscle.  The platysma was identified, elevated, divided, and undermined.  The sternocleidomastoid is identified and medial dissection revealed the  carotid artery tract laterally to the left and tracheoesophageal tract  laterally to the right.  Extensive dissection was carried out cephalocaudal  to expose the bones of the cervical spine from C3-T1.  Several  intraoperative x-rays were used to confirm correctness level.  The longus  colli was taken down bilaterally and using the high speed drill and Leksell  corpectomy of C4, C5, C6, and C7 was carried  out.  This was done down to the  dura.  The operating microscope was then brought in and the posterior  longitudinal ligament removed from the bottom of C3 to the top of  T1 and  there was undercutting of both C3 and T1.  Following complete decompression  P cage was selected and size and packed with morcellized allograft.  It was  tapped into place.  A Reflex Hybrid plate was then placed with one 14 and  one 12-mm screw in C3 and one 14 and one 12-mm screw in T1.  Because of the  patient's body habitus the __________ imaging was really precluded except  for the top into the construct.  Inspection of the construct was  satisfactory.  This incision was closed with successive layers of 3-0 and 4-  0 Vicryl.  The Stryker bed was then used to turn the patient prone.  Once  again following shave, prep, and drape in the usual sterile fashion, the  skin was incised from the occiput down to T1.  The lamina of C3, C4, C5, C6,  C7, and the T1 transverse processes were dissected free.  Once again,  intraoperative x-ray confirmed correctness level counting down from C3.  Lateral mass screws were then placed in C3, C4, C5, C6, and C7 bilaterally  and in T1 pedicles bilaterally.  They were connected via rod and locking  caps placed.  The wound was irrigated, hemostasis ensured.  The lamina were  decorticated and BMP on the matrix was placed as opposed to a posterior  fusion.  Successive layers of 0 Vicryl, 2-0 Vicryl, and 2-0 nylon were then  used to close this incision.  It should be noted that prior to closing the  incision in the front of the neck, the esophagus and hypopharynx were  carefully inspected and found to be free of defects.  A 7-mm Jackson-Pratt  drain was also placed in the anterior incision.  The patient was placed in  an Aspen collar and returned to the ICU still intubated and will remain so  for several days.           ______________________________  Payton Doughty, M.D.      MWR/MEDQ  D:  02/28/2006  T:  03/03/2006  Job:  [id number]

## 2011-05-03 ENCOUNTER — Telehealth: Payer: Self-pay | Admitting: Internal Medicine

## 2011-05-03 ENCOUNTER — Encounter: Payer: Self-pay | Admitting: Internal Medicine

## 2011-05-03 ENCOUNTER — Ambulatory Visit (INDEPENDENT_AMBULATORY_CARE_PROVIDER_SITE_OTHER): Payer: BC Managed Care – PPO | Admitting: *Deleted

## 2011-05-03 DIAGNOSIS — G894 Chronic pain syndrome: Secondary | ICD-10-CM

## 2011-05-03 DIAGNOSIS — E538 Deficiency of other specified B group vitamins: Secondary | ICD-10-CM

## 2011-05-03 MED ORDER — CYANOCOBALAMIN 1000 MCG/ML IJ SOLN
1000.0000 ug | Freq: Once | INTRAMUSCULAR | Status: AC
  Administered 2011-05-03: 1000 ug via INTRAMUSCULAR

## 2011-05-03 NOTE — Telephone Encounter (Signed)
Mr. Weedman comes into clinic today for his B12 shot.  He presents a prescription for percocet written by Dr. Phillips Odor with a notation to refill 06/01/2011.  He states he can not get any refills until then and this is apparently a problem.  Review of the record demonstrates that he got 120 percocet on 04/26/2011 from his [facility] (not yet in the [facility] Databank).  I asked that he get a urine drug screen today as his last dose, per him, was this AM.  I have not changed Dr. Lamar Blinks prescription, the next refill date was left at 06/01/2011.  Will follow up on the drug screen result given the strange request to change the refill date when he has already received percocet #120 recently.

## 2011-05-04 LAB — DRUGS OF ABUSE SCREEN W/O ALC, ROUTINE URINE
Barbiturate Quant, Ur: NEGATIVE
Cocaine Metabolites: NEGATIVE
Creatinine,U: 52.7 mg/dL
Methadone: NEGATIVE
Opiate Screen, Urine: NEGATIVE

## 2011-05-08 LAB — OPIATE, QUANTITATIVE, URINE
Codeine Urine: NEGATIVE NG/ML
Hydromorphone - Total: NEGATIVE NG/ML
Oxycodone - Total: 2266 NG/ML — ABNORMAL HIGH

## 2011-05-20 ENCOUNTER — Other Ambulatory Visit: Payer: Self-pay | Admitting: *Deleted

## 2011-05-20 NOTE — Telephone Encounter (Signed)
Call from pt's wife asking for cough medication for pt.  RTC to pt using both contact numbers to get more information about the cough.  Message left for the pt or his wife to call the Clinics.

## 2011-05-22 MED ORDER — OXYCODONE HCL 20 MG PO TB12: 20.0000 mg | ORAL_TABLET | Freq: Two times a day (BID) | ORAL | Status: DC

## 2011-05-22 NOTE — Telephone Encounter (Signed)
Agree that we need more info about his cough. I have refilled his Oxycodone-long acting.

## 2011-05-27 ENCOUNTER — Ambulatory Visit (INDEPENDENT_AMBULATORY_CARE_PROVIDER_SITE_OTHER): Payer: BC Managed Care – PPO | Admitting: *Deleted

## 2011-05-27 ENCOUNTER — Other Ambulatory Visit: Payer: Self-pay | Admitting: Internal Medicine

## 2011-05-27 DIAGNOSIS — E538 Deficiency of other specified B group vitamins: Secondary | ICD-10-CM

## 2011-05-27 MED ORDER — OXYCODONE-ACETAMINOPHEN 10-325 MG PO TABS: 1.0000 | ORAL_TABLET | ORAL | Status: DC | PRN

## 2011-05-27 MED ORDER — DEXTROMETHORPHAN-GUAIFENESIN 10-100 MG/5ML PO LIQD: 5.0000 mL | ORAL | Status: AC | PRN

## 2011-05-27 MED ORDER — CYANOCOBALAMIN 1000 MCG/ML IJ SOLN
1000.0000 ug | Freq: Once | INTRAMUSCULAR | Status: AC
  Administered 2011-05-27: 1000 ug via INTRAMUSCULAR

## 2011-05-27 NOTE — Progress Notes (Signed)
3 months of scripts given-last one dated 07/27/2011.

## 2011-06-22 ENCOUNTER — Encounter: Payer: Self-pay | Admitting: Internal Medicine

## 2011-06-30 ENCOUNTER — Emergency Department (HOSPITAL_COMMUNITY): Payer: BC Managed Care – PPO

## 2011-06-30 ENCOUNTER — Inpatient Hospital Stay (HOSPITAL_COMMUNITY)
Admission: EM | Admit: 2011-06-30 | Discharge: 2011-07-14 | DRG: 569 | Disposition: A | Discharge status: Home Health | Payer: BC Managed Care – PPO | Attending: Internal Medicine | Admitting: Internal Medicine

## 2011-06-30 DIAGNOSIS — E785 Hyperlipidemia, unspecified: Secondary | ICD-10-CM | POA: Diagnosis present

## 2011-06-30 DIAGNOSIS — Z981 Arthrodesis status: Secondary | ICD-10-CM

## 2011-06-30 DIAGNOSIS — A4902 Methicillin resistant Staphylococcus aureus infection, unspecified site: Secondary | ICD-10-CM | POA: Diagnosis present

## 2011-06-30 DIAGNOSIS — N360 Urethral fistula: Principal | ICD-10-CM | POA: Diagnosis present

## 2011-06-30 DIAGNOSIS — G8220 Paraplegia, unspecified: Secondary | ICD-10-CM | POA: Diagnosis present

## 2011-06-30 DIAGNOSIS — Z87440 Personal history of urinary (tract) infections: Secondary | ICD-10-CM

## 2011-06-30 DIAGNOSIS — L02419 Cutaneous abscess of limb, unspecified: Secondary | ICD-10-CM | POA: Diagnosis present

## 2011-06-30 DIAGNOSIS — B9689 Other specified bacterial agents as the cause of diseases classified elsewhere: Secondary | ICD-10-CM | POA: Diagnosis present

## 2011-06-30 DIAGNOSIS — L89899 Pressure ulcer of other site, unspecified stage: Secondary | ICD-10-CM | POA: Diagnosis present

## 2011-06-30 DIAGNOSIS — N453 Epididymo-orchitis: Secondary | ICD-10-CM | POA: Diagnosis present

## 2011-06-30 DIAGNOSIS — Z79899 Other long term (current) drug therapy: Secondary | ICD-10-CM

## 2011-06-30 DIAGNOSIS — L03119 Cellulitis of unspecified part of limb: Secondary | ICD-10-CM | POA: Diagnosis present

## 2011-06-30 DIAGNOSIS — K219 Gastro-esophageal reflux disease without esophagitis: Secondary | ICD-10-CM | POA: Diagnosis present

## 2011-06-30 DIAGNOSIS — IMO0002 Reserved for concepts with insufficient information to code with codable children: Secondary | ICD-10-CM

## 2011-06-30 DIAGNOSIS — L8992 Pressure ulcer of unspecified site, stage 2: Secondary | ICD-10-CM | POA: Diagnosis present

## 2011-06-30 DIAGNOSIS — N498 Inflammatory disorders of other specified male genital organs: Secondary | ICD-10-CM | POA: Diagnosis present

## 2011-06-30 DIAGNOSIS — I10 Essential (primary) hypertension: Secondary | ICD-10-CM | POA: Diagnosis present

## 2011-06-30 DIAGNOSIS — F411 Generalized anxiety disorder: Secondary | ICD-10-CM | POA: Diagnosis present

## 2011-06-30 DIAGNOSIS — N390 Urinary tract infection, site not specified: Secondary | ICD-10-CM | POA: Diagnosis present

## 2011-06-30 LAB — CBC
HCT: 36.8 % — ABNORMAL LOW (ref 39.0–52.0)
Hemoglobin: 13.3 g/dL (ref 13.0–17.0)
MCV: 83.6 fL (ref 78.0–100.0)
Platelets: 437 10*3/uL — ABNORMAL HIGH (ref 150–400)
RBC: 4.4 MIL/uL (ref 4.22–5.81)
WBC: 18.4 10*3/uL — ABNORMAL HIGH (ref 4.0–10.5)

## 2011-06-30 LAB — BASIC METABOLIC PANEL
CO2: 22 mEq/L (ref 19–32)
Chloride: 96 mEq/L (ref 96–112)
Creatinine, Ser: 0.69 mg/dL (ref 0.50–1.35)
Glucose, Bld: 112 mg/dL — ABNORMAL HIGH (ref 70–99)

## 2011-06-30 LAB — URINALYSIS, ROUTINE W REFLEX MICROSCOPIC
Bilirubin Urine: NEGATIVE
Glucose, UA: NEGATIVE mg/dL
Ketones, ur: NEGATIVE mg/dL
Protein, ur: 30 mg/dL — AB
Urobilinogen, UA: 0.2 mg/dL (ref 0.0–1.0)

## 2011-06-30 LAB — DIFFERENTIAL
Eosinophils Absolute: 0.1 10*3/uL (ref 0.0–0.7)
Lymphocytes Relative: 8 % — ABNORMAL LOW (ref 12–46)
Lymphs Abs: 1.4 10*3/uL (ref 0.7–4.0)
Neutro Abs: 15.6 10*3/uL — ABNORMAL HIGH (ref 1.7–7.7)
Neutrophils Relative %: 85 % — ABNORMAL HIGH (ref 43–77)

## 2011-06-30 LAB — URINE MICROSCOPIC-ADD ON

## 2011-07-01 ENCOUNTER — Encounter: Payer: Self-pay | Admitting: Ophthalmology

## 2011-07-01 DIAGNOSIS — N453 Epididymo-orchitis: Secondary | ICD-10-CM

## 2011-07-01 DIAGNOSIS — N5089 Other specified disorders of the male genital organs: Secondary | ICD-10-CM

## 2011-07-01 DIAGNOSIS — L03119 Cellulitis of unspecified part of limb: Secondary | ICD-10-CM

## 2011-07-01 DIAGNOSIS — K612 Anorectal abscess: Secondary | ICD-10-CM

## 2011-07-01 DIAGNOSIS — L02419 Cutaneous abscess of limb, unspecified: Secondary | ICD-10-CM

## 2011-07-01 LAB — CBC
HCT: 35.9 % — ABNORMAL LOW (ref 39.0–52.0)
MCH: 28.9 pg (ref 26.0–34.0)
MCV: 84.5 fL (ref 78.0–100.0)
RDW: 14.9 % (ref 11.5–15.5)
WBC: 12.4 10*3/uL — ABNORMAL HIGH (ref 4.0–10.5)

## 2011-07-01 LAB — BASIC METABOLIC PANEL
BUN: 12 mg/dL (ref 6–23)
CO2: 26 mEq/L (ref 19–32)
Chloride: 98 mEq/L (ref 96–112)
Creatinine, Ser: 0.71 mg/dL (ref 0.50–1.35)
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
## 2011-07-02 LAB — CBC
Platelets: 410 10*3/uL — ABNORMAL HIGH (ref 150–400)
RDW: 14.8 % (ref 11.5–15.5)
WBC: 10 10*3/uL (ref 4.0–10.5)

## 2011-07-02 LAB — URINE CULTURE: Colony Count: 25000

## 2011-07-02 LAB — BASIC METABOLIC PANEL
Chloride: 99 mEq/L (ref 96–112)
GFR calc Af Amer: >60 mL/min (ref >60)
Potassium: 3.9 mEq/L (ref 3.5–5.1)
Sodium: 131 mEq/L — ABNORMAL LOW (ref 135–145)

## 2011-07-02 LAB — GC/CHLAMYDIA PROBE AMP, URINE: Chlamydia, Swab/Urine, PCR: NEGATIVE

## 2011-07-02 LAB — SURGICAL PCR SCREEN: MRSA, PCR: NEGATIVE

## 2011-07-03 DIAGNOSIS — N5089 Other specified disorders of the male genital organs: Secondary | ICD-10-CM

## 2011-07-03 DIAGNOSIS — N453 Epididymo-orchitis: Secondary | ICD-10-CM

## 2011-07-03 LAB — CBC
HCT: 31.3 % — ABNORMAL LOW (ref 39.0–52.0)
Hemoglobin: 10.8 g/dL — ABNORMAL LOW (ref 13.0–17.0)
MCHC: 34.5 g/dL (ref 30.0–36.0)
RBC: 3.71 MIL/uL — ABNORMAL LOW (ref 4.22–5.81)

## 2011-07-03 LAB — DIFFERENTIAL
Basophils Absolute: 0 10*3/uL (ref 0.0–0.1)
Lymphocytes Relative: 16 % (ref 12–46)
Monocytes Absolute: 0.6 10*3/uL (ref 0.1–1.0)
Neutro Abs: 6.4 10*3/uL (ref 1.7–7.7)

## 2011-07-03 LAB — VANCOMYCIN, TROUGH: Vancomycin Tr: 23.2 ug/mL — ABNORMAL HIGH (ref 10.0–20.0)

## 2011-07-04 ENCOUNTER — Inpatient Hospital Stay (HOSPITAL_COMMUNITY): Payer: BC Managed Care – PPO

## 2011-07-04 DIAGNOSIS — N498 Inflammatory disorders of other specified male genital organs: Secondary | ICD-10-CM

## 2011-07-04 LAB — WOUND CULTURE

## 2011-07-04 LAB — PREALBUMIN: Prealbumin: 16.8 mg/dL — ABNORMAL LOW (ref 17.0–34.0)

## 2011-07-04 LAB — HIV ANTIBODY (ROUTINE TESTING W REFLEX): HIV: NONREACTIVE

## 2011-07-04 NOTE — Op Note (Signed)
  NAMEDARNELL, Shawn [name] NO.:  [id number]  MEDICAL RECORD NO.:  [id number]  LOCATION:  [facility]                         FACILITY:  [facility]  PHYSICIAN:  Gabrielle Dare. Janee Morn, M.D.DATE OF BIRTH:  03-Aug-1951  DATE OF PROCEDURE:  07/02/2011 DATE OF DISCHARGE:                              OPERATIVE REPORT   PREOPERATIVE DIAGNOSIS:  Abscess from the scrotum to right leg.  POSTOPERATIVE DIAGNOSIS:  Abscess from the scrotum to right leg.  PROCEDURE:  Incision and drainage of abscess from the scrotum to the right leg.  SURGEON:  Gabrielle Dare. Janee Morn, MD  ANESTHESIA:  General endotracheal.  HISTORY OF PRESENT ILLNESS:  [name] is a 60 year old gentleman with history of paraplegia, who has developed an abscess from the left base of his scrotum down to his right leg.  It was noted to be leaking urine. He was seen also by Dr. Brunilda Payor from Urology regarding orchitis and this urinary leakage.  We are proceeding to the operating room today to incise and drain this abscess.  Dr. Brunilda Payor will follow with cystoscopy and SP tube placement to divert the urinary flow.  PROCEDURE IN DETAIL:  Informed consent was obtained.  The patient was identified in the preop holding area.  He is receiving antibiotics intravenously on regimen.  He was brought to the operating room. General endotracheal anesthesia was administered by the Anesthesia staff.  He was placed on lithotomy position.  His scrotum and perineum were all prepped and draped in sterile fashion.  Time-out procedure was done.  Next, the base of the scrotum was inspected.  There was a hard indurated area extending up and over from a visible sinus, and when he palpated this hard area, clear fluid with purulence drained from the hole.  There was also another spinous further down on the leg at the base of the scrotum and then another one further down the leg several centimeters away.  At this time, a very hard indurated area was  opened releasing purulent fluid, this was sent for cultures.  The scrotum was opened back up to the first leg drainage site.  Further loculations were broken up into an abscess cavity.  This was all copiously irrigated and hemostasis was obtained with cautery.  Next, the furthest way sinus on the leg was cored out with cautery.  There were no obvious tracking connection, however, I suspected it was just not connected, that wound with cleaned out and both wounds were copiously irrigated. Hemostasis was assured and both were packed with 1-inch Iodoform gauze. Sterile dressings were applied.  The patient remained in the operating room for Dr. Brunilda Payor for cystoscopy and placement of suprapubic tube.     Gabrielle Dare Janee Morn, M.D.     BET/MEDQ  D:  07/02/2011  T:  07/03/2011  Job:  [id number]  cc:   Lindaann Slough, M.D.  Electronically Signed by Violeta Gelinas M.D. on 07/04/2011 08:50:42 AM

## 2011-07-05 DIAGNOSIS — N498 Inflammatory disorders of other specified male genital organs: Secondary | ICD-10-CM

## 2011-07-05 LAB — CULTURE, ROUTINE-ABSCESS: Culture: NO GROWTH

## 2011-07-05 NOTE — Discharge Summary (Signed)
Physician Discharge Summary  Patient ID: MATTHER LABELL MRN: [id number] DOB/AGE: 03/22/1951 60 y.o.  Admit date: 07/01/2011 Discharge date: 07/05/2011  Admission Diagnoses: scrotal swellinh  Discharge Diagnoses:  Scrotal swelling, s/p  Incision and drainage of abscess from the scrotum to the right leg 07/03/2011 (Dr. Janee Morn) S/p  Cystoscopy and insertion of suprapubic tube.07/02/2011 (Dr. Su Grand)   Discharged Condition: stable  Hospital Course: s/ p procedures noted above, stable post op course  Consults: surgery for I&D (Dr. Janee Morn), urology (Dr. Brunilda Payor)  Treatments: vancomycin and zosyn via PICC line  Disposition:  Follow up appointments: per discharge     Signed: MAGICK,Marayah Higdon 07/05/2011, 9:19 AM

## 2011-07-07 LAB — CULTURE, BLOOD (ROUTINE X 2)
Culture  Setup Time: 201207230920
Culture: NO GROWTH

## 2011-07-07 LAB — ANAEROBIC CULTURE

## 2011-07-08 LAB — BASIC METABOLIC PANEL
BUN: 14 mg/dL (ref 6–23)
CO2: 27 mEq/L (ref 19–32)
Chloride: 103 mEq/L (ref 96–112)
Creatinine, Ser: 0.71 mg/dL (ref 0.50–1.35)

## 2011-07-09 DIAGNOSIS — N453 Epididymo-orchitis: Secondary | ICD-10-CM

## 2011-07-09 DIAGNOSIS — N5089 Other specified disorders of the male genital organs: Secondary | ICD-10-CM

## 2011-07-10 DIAGNOSIS — N453 Epididymo-orchitis: Secondary | ICD-10-CM

## 2011-07-10 DIAGNOSIS — N5089 Other specified disorders of the male genital organs: Secondary | ICD-10-CM

## 2011-07-12 DIAGNOSIS — N5089 Other specified disorders of the male genital organs: Secondary | ICD-10-CM

## 2011-07-12 DIAGNOSIS — N453 Epididymo-orchitis: Secondary | ICD-10-CM

## 2011-07-13 LAB — BASIC METABOLIC PANEL
GFR calc Af Amer: >60 mL/min (ref >60)
GFR calc non Af Amer: >60 mL/min (ref >60)
Glucose, Bld: 122 mg/dL — ABNORMAL HIGH (ref 70–99)
Potassium: 3.5 mEq/L (ref 3.5–5.1)
Sodium: 137 mEq/L (ref 135–145)

## 2011-07-17 ENCOUNTER — Ambulatory Visit (INDEPENDENT_AMBULATORY_CARE_PROVIDER_SITE_OTHER): Payer: BC Managed Care – PPO | Admitting: Internal Medicine

## 2011-07-17 ENCOUNTER — Ambulatory Visit: Payer: BC Managed Care – PPO | Admitting: Infectious Diseases

## 2011-07-17 ENCOUNTER — Encounter: Payer: BC Managed Care – PPO | Admitting: Internal Medicine

## 2011-07-17 DIAGNOSIS — L02215 Cutaneous abscess of perineum: Secondary | ICD-10-CM

## 2011-07-17 DIAGNOSIS — L02219 Cutaneous abscess of trunk, unspecified: Secondary | ICD-10-CM
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
## 2011-07-17 NOTE — Patient Instructions (Addendum)
Please follow up with your appointment with Dr. Orvan Falconer on Tuesday , 07/23/11 at 3: 00 PM at the Infectious Disease clinic. You may call the number [phone]. Please take your medicines as prescribed. Please continue with your IV antibiotics through 07/24/11  Or until evaluated by Dr. Orvan Falconer. Please continue the wound care. Please schedule a follow up appointment with your PCP in 2 months or earlier if needed.

## 2011-07-17 NOTE — Progress Notes (Signed)
  Subjective:    Patient ID: Shawn [name], male    DOB: 02-19-1951, 60 y.o.   MRN: [id number]  HPI    Review of Systems     Objective:   Physical Exam        Assessment & Plan:

## 2011-07-18 ENCOUNTER — Encounter: Payer: Self-pay | Admitting: Internal Medicine

## 2011-07-18 DIAGNOSIS — L02215 Cutaneous abscess of perineum: Secondary | ICD-10-CM | POA: Insufficient documentation

## 2011-07-18 NOTE — Assessment & Plan Note (Signed)
Hospitalized through  07/01/2011 to 7/ 27/ 2012 for perineal abscess post I and D, on 07/02/2011 . His cultures from I&D were growing MRSA and had UTI cultures growing Citrobacter. Likely the etiology for his abscess was though to be polymicrobial for which he was continued on her back and Zosyn in the hospital. Patient was discharged home on the vancomycin and invanz , aiming for in total 3 weeks of therapy, starting from his date of surgery(07/02/11). His I&D site appears to be healthy and healing well and  No exudate/blood was noticed. He was advised to continue his antibiotics for one more week  Through 07/24/11 and then follow up with  ID clinic to further determine the duration of antibiotics. Patient had an appointment with Dr. Ninetta Lights today, but there was some confusion about the telephone number and they missed that appointment. Patient has been  scheduled another appointment with Dr. Orvan Falconer on 07/23/2011 at 3 PM. Address and telephone number was given to the patient and his wife.

## 2011-07-23 ENCOUNTER — Encounter: Payer: Self-pay | Admitting: Internal Medicine

## 2011-07-23 ENCOUNTER — Ambulatory Visit (INDEPENDENT_AMBULATORY_CARE_PROVIDER_SITE_OTHER): Payer: BC Managed Care – PPO | Admitting: Internal Medicine

## 2011-07-23 VITALS — BP 125/80 | HR 61 | Temp 98.0°F

## 2011-07-23 DIAGNOSIS — L02219 Cutaneous abscess of trunk, unspecified: Secondary | ICD-10-CM

## 2011-07-23 DIAGNOSIS — L02215 Cutaneous abscess of perineum: Secondary | ICD-10-CM

## 2011-07-23 NOTE — Assessment & Plan Note (Signed)
He is doing much better and it appears that he has abscess and UTI have resolved. I will stop his antibiotics today and have the PICC removed.

## 2011-07-23 NOTE — Progress Notes (Signed)
  Subjective:    Patient ID: Shawn [name], male    DOB: 03-23-1951, 60 y.o.   MRN: [id number]  HPI Mr. Cline is in with his wife today for his hospital followup visit. He is a 60 year old paraplegic who was admitted to [facility] last month with a scrotal abscess extending into his right leg posteriorly associated with a urine leak. He had his chronic indwelling Foley changed to a suprapubic catheter and underwent incision and drainage. Urine cultures grew Citrobacter brackii and the abscess cultures grew MRSA. He was treated in the hospital with vancomycin and Zosyn and discharged home on vancomycin and Invanz. He is now completing his 23rd day of total antibiotics. He has had some soft stools but no diarrhea. He has tolerated the PICC and antibiotics well. His wife has been very diligent about getting his antibiotics correctly and doing dressing changes. She states that his wound is doing much better.    Review of Systems     Objective:   Physical Exam  Constitutional: He appears well-nourished. No distress.  Genitourinary:       His suprapubic catheter site looks good.  He has 2 residual wounds on the right thigh posteriorly near the perineum. They're very shallow, at 100% pink granulation tissue without any drainage or odor.  Musculoskeletal:       His right arm PICC site is normal.          Assessment & Plan:

## 2011-07-23 NOTE — Progress Notes (Signed)
Order to remove PICC line obtained from Dr. Orvan Falconer. Pt. identified with name and date of birth. PICC dressing removed, site unremarkable. PICC line removed using sterile procedure @ 1500 pm. PICC length equal to that noted in pt's hospital chart of 42 cm. Sterile petroleum gauze + sterile 4X4 applied to PICC site, pressure applied for 10 minutes and covered with Medipore tape as a pressure dressing. Pt. instructed to limit use of arm for 1 hour. Pt. instructed that the pressure dressing should remain in place for 24 hours. Pt. verablized understanding of these instructions. Jennet Maduro, RN
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
## 2011-07-26 NOTE — Op Note (Signed)
Shawn [name], Shawn [name] NO.:  [id number]  MEDICAL RECORD NO.:  [id number]  LOCATION:  [facility]                         FACILITY:  [facility]  PHYSICIAN:  Danae Chen, M.D.  DATE OF BIRTH:  March 30, 1951  DATE OF PROCEDURE:  07/02/2011 DATE OF DISCHARGE:                              OPERATIVE REPORT   PREOPERATIVE DIAGNOSES:  Recurrent urinary tract infections, scrotal abscess, and urethrocutaneous fistula.  POSTOPERATIVE DIAGNOSES: Recurrent urinary tract infections, scrotal abscess, and urethrocutaneous fistula.  PROCEDURE DONE: Cystoscopy and insertion of suprapubic tube.  SURGEON: Danae Chen, MD.  ANESTHESIA: General.  INDICATIONS: The patient is a 60 year old male, known paraplegic, who had removal of eroded penile prosthesis about a year and half ago.  He has had recurrent urinary tract infections.  He had one episode of gross hematuria about 2 weeks ago and was treated with an indwelling Foley catheter that was removed a week ago.  A few days after that, he started having swelling and pain of the scrotum.  Swelling and pain got worse and he was brought to the emergency room 2 days ago.  Scrotal ultrasound showed findings suggestive of a epididymitis.  The swelling continued to persist and he started draining some purulent material from the scrotum. He was found then to have a perineal scrotal abscess.  He was seen by Dr. Janee Morn and is scheduled for incision and drainage of the abscess. I was asked to see him in consultation because of his recurrent urinary tract infection and also on examination he was found to have drainage of urine through that abscess.  On examination, he has urethrocutaneous fistula and scrotal perineal abscess.  He is scheduled for incision and drainage of the scrotal abscess by Dr. Janee Morn and I will put an SP tube to divert his urine.  The patient was identified by his wristband and proper time-out was taken.  The incision  and drainage of the abscess was done by Dr. Janee Morn. After completion of Dr. Carollee Massed procedure, I came in for cystoscopy and insertion of the SP tube.  The patient was already in dorsal lithotomy position.  I passed a cystoscope in the bladder.  The anterior urethra is normal.  There is a false passage in the posterior urethra on the right side of the urethra. I do not see any evidence of purulent drainage out of that  false passage.  A cystoscope was passed in the bladder without difficulty.  There is no stone or tumor in the bladder.  The bladder mucosa is reddened.  The bladder was then filled with normal saline.  I then removed the cystoscope and placed the patient in Trendelenburg position.  I passed a Lowsley sound in the bladder, and with palpation I was able to feel the tip of the sound in the suprapubic area where I made a stab wound incision.  The incision was carried down to the fascia which was also incised.  Then, I was able to feel the tip of the sound and make an incision over the tip of the sound and I was able to pass  the sound through the skin incision.  A #18-French Foley catheter  was then passed through the jaws of the sound and pulled through the urethra.  Then with the cystoscope and under direct vision, I pulled the Foley catheter back in the bladder.  Then, the balloon of the Foley catheter was inflated with 10 mL of water and the catheter was left to straight drainage.  I then removed the cystoscope.  The Foley catheter was secured onto the skin with #2-0 silk.  The patient tolerated the procedure well and left the OR in satisfactory condition to Post Anesthesia Care Unit.     Danae Chen, M.D.     MN/MEDQ  D:  07/03/2011  T:  07/03/2011  Job:  [id number]  Electronically Signed by Lindaann Slough M.D. on 07/26/2011 10:45:52 AM

## 2011-07-31 ENCOUNTER — Telehealth: Payer: Self-pay | Admitting: *Deleted

## 2011-07-31 NOTE — Telephone Encounter (Signed)
Agree with VO for care.
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
## 2011-07-31 NOTE — Telephone Encounter (Signed)
Call from [place], RN with [facility].  She has been caring for pt and is  asking for VO to see pt once a week for 2 more weeks. She will do assessments and wound care. # [phone]  I can call this in.

## 2011-08-02 NOTE — Telephone Encounter (Signed)
[facility] nurse informed.  
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
## 2011-08-14 ENCOUNTER — Ambulatory Visit (INDEPENDENT_AMBULATORY_CARE_PROVIDER_SITE_OTHER): Payer: BC Managed Care – PPO | Admitting: General Surgery

## 2011-08-14 ENCOUNTER — Encounter (INDEPENDENT_AMBULATORY_CARE_PROVIDER_SITE_OTHER): Payer: Self-pay | Admitting: General Surgery

## 2011-08-14 ENCOUNTER — Telehealth: Payer: Self-pay | Admitting: *Deleted

## 2011-08-14 VITALS — BP 150/96 | HR 78

## 2011-08-14 DIAGNOSIS — L03319 Cellulitis of trunk, unspecified: Secondary | ICD-10-CM

## 2011-08-14 DIAGNOSIS — L02215 Cutaneous abscess of perineum: Secondary | ICD-10-CM

## 2011-08-14 NOTE — Telephone Encounter (Signed)
[facility] calls to say she disch pt from skilled nsg visits 9/4, if questions please call her

## 2011-08-14 NOTE — Telephone Encounter (Signed)
Noted. Patient should call if needs or concerns.

## 2011-08-14 NOTE — Progress Notes (Signed)
Subjective:     Patient ID: Shawn [name], male   DOB: [birth year], 60 y.o.   MRN: [id number]  HPI Patient presents for followup from the hospital. He had a significant perineal and scrotal abscess. He is a paraplegic. During hospitalization he underwent hospital incision and drainage as well as suprapubic catheter placement. Dr. Brunilda Payor from [facility] placed his SP tube. We've been managing his wounds. His wife has been doing his dressing changes after being taught by home health nursing. He is doing well.  Review of Systems     Objective:   Physical Exam  HENT:  Head: Normocephalic and atraumatic.  Neck: Normal range of motion. Neck supple.  Cardiovascular: Normal rate.   Pulmonary/Chest: Effort normal and breath sounds normal.  Abdominal: Soft. There is no tenderness. There is no rebound.  Perineum reveals 2 open wounds. The smaller is nearly re\re epithelialized and is less than a centimeter. The larger and more lateral and superior is about 2 cm wide and 1 cm deep. There is excellent clean granulation tissue no evidence of ongoing infection. His SP tube is in place without complicating features     Assessment:    Doing well after incision and drainage of complex perineal abscess    Plan:    Wet-to-dry dressings to the large one daily dry gauze dressing to the other one daily followup in 4-6 weeks. Patient is also set up off of Dr. Brunilda Payor.

## 2011-08-14 NOTE — Patient Instructions (Signed)
Wet to dry dressing to the large wound and just a dry gauze to the smaller wound.

## 2011-08-23 ENCOUNTER — Other Ambulatory Visit: Payer: Self-pay | Admitting: *Deleted

## 2011-08-23 NOTE — Telephone Encounter (Signed)
Last refill 6/18

## 2011-08-27 ENCOUNTER — Ambulatory Visit (INDEPENDENT_AMBULATORY_CARE_PROVIDER_SITE_OTHER): Payer: BC Managed Care – PPO | Admitting: *Deleted

## 2011-08-27 DIAGNOSIS — E538 Deficiency of other specified B group vitamins: Secondary | ICD-10-CM

## 2011-08-27 MED ORDER — CYANOCOBALAMIN 1000 MCG/ML IJ SOLN
1000.0000 ug | Freq: Once | INTRAMUSCULAR | Status: AC
  Administered 2011-08-27: 1000 ug via INTRAMUSCULAR

## 2011-08-27 MED ORDER — OXYCODONE-ACETAMINOPHEN 10-325 MG PO TABS: 1.0000 | ORAL_TABLET | ORAL | Status: DC | PRN

## 2011-08-29 LAB — DIFFERENTIAL
Basophils Absolute: 0
Basophils Relative: 0
Lymphocytes Relative: 13
Lymphs Abs: 1.3
Monocytes Relative: 10
Neutro Abs: 13 — ABNORMAL HIGH
Neutro Abs: 8 — ABNORMAL HIGH
Neutrophils Relative %: 76
Neutrophils Relative %: 82 — ABNORMAL HIGH

## 2011-08-29 LAB — URINALYSIS, ROUTINE W REFLEX MICROSCOPIC
Ketones, ur: NEGATIVE
Nitrite: NEGATIVE
Protein, ur: NEGATIVE

## 2011-08-29 LAB — I-STAT 8, (EC8 V) (CONVERTED LAB)
Acid-Base Excess: 2
BUN: 20
Bicarbonate: 24.8 — ABNORMAL HIGH
Chloride: 96
Glucose, Bld: 121 — ABNORMAL HIGH
HCT: 36 — ABNORMAL LOW
Hemoglobin: 12.2 — ABNORMAL LOW
Operator id: 270651
Potassium: 3.5
Sodium: 129 — ABNORMAL LOW
TCO2: 26
pCO2, Ven: 31.2 — ABNORMAL LOW
pH, Ven: 7.508 — ABNORMAL HIGH

## 2011-08-29 LAB — CBC
HCT: 26.7 — ABNORMAL LOW
HCT: 28.1 — ABNORMAL LOW
Hemoglobin: 9.4 — ABNORMAL LOW
MCHC: 34.1
MCHC: 34.5
MCHC: 35
MCV: 87.1
MCV: 88.3
Platelets: 485 — ABNORMAL HIGH
Platelets: 599 — ABNORMAL HIGH
RBC: 2.93 — ABNORMAL LOW
RBC: 3.11 — ABNORMAL LOW
RBC: 3.85 — ABNORMAL LOW
RDW: 14.6
RDW: 14.9
RDW: 15
WBC: 16 — ABNORMAL HIGH
WBC: 6.1

## 2011-08-29 LAB — CULTURE, BLOOD (ROUTINE X 2): Culture: NO GROWTH

## 2011-08-29 LAB — COMPREHENSIVE METABOLIC PANEL
ALT: 14
Alkaline Phosphatase: 58
CO2: 22
Calcium: 7.8 — ABNORMAL LOW
Chloride: 100
GFR calc non Af Amer: >60
Glucose, Bld: 111 — ABNORMAL HIGH
Sodium: 133 — ABNORMAL LOW
Total Bilirubin: 0.4

## 2011-08-29 LAB — BASIC METABOLIC PANEL
BUN: 3 — ABNORMAL LOW
CO2: 24
CO2: 25
Calcium: 7.5 — ABNORMAL LOW
Calcium: 7.9 — ABNORMAL LOW
Calcium: 8.6
Chloride: 107
Chloride: 108
Creatinine, Ser: 0.64
GFR calc Af Amer: >60
GFR calc non Af Amer: >60
GFR calc non Af Amer: >60
GFR calc non Af Amer: >60
Glucose, Bld: 96
Glucose, Bld: 99
Potassium: 3.6
Potassium: 3.7
Sodium: 133 — ABNORMAL LOW
Sodium: 139

## 2011-08-29 LAB — COMPREHENSIVE METABOLIC PANEL WITH GFR
AST: 10
Albumin: 2.1 — ABNORMAL LOW
BUN: 9
Creatinine, Ser: 0.78
GFR calc Af Amer: >60
Potassium: 3 — ABNORMAL LOW
Total Protein: 5.8 — ABNORMAL LOW

## 2011-08-29 LAB — ANAEROBIC CULTURE: Gram Stain: NONE SEEN

## 2011-08-29 LAB — URINE CULTURE: Colony Count: >=100000

## 2011-08-29 LAB — WOUND CULTURE

## 2011-08-29 LAB — BASIC METABOLIC PANEL WITH GFR
BUN: 7
CO2: 22
Chloride: 103
Creatinine, Ser: 0.7
GFR calc Af Amer: >60
Glucose, Bld: 99

## 2011-08-29 LAB — TISSUE CULTURE
Culture: NO GROWTH
Gram Stain: NONE SEEN

## 2011-08-29 LAB — MAGNESIUM
Magnesium: 1.9
Magnesium: 2.1

## 2011-08-29 LAB — URINE MICROSCOPIC-ADD ON

## 2011-08-29 LAB — PREALBUMIN: Prealbumin: 9.5 — ABNORMAL LOW

## 2011-08-29 LAB — POCT I-STAT CREATININE
Creatinine, Ser: 1
Operator id: 270651

## 2011-08-29 LAB — CORTISOL: Cortisol, Plasma: 11.6

## 2011-08-29 LAB — RETICULOCYTES: Retic Count, Absolute: 22.8

## 2011-08-29 LAB — FOLATE: Folate: 15.2

## 2011-08-29 LAB — IRON AND TIBC: TIBC: 129 — ABNORMAL LOW

## 2011-09-03 ENCOUNTER — Encounter: Payer: Self-pay | Admitting: Internal Medicine

## 2011-09-10 LAB — CBC
HCT: 23.1 % — ABNORMAL LOW (ref 39.0–52.0)
HCT: 29 % — ABNORMAL LOW (ref 39.0–52.0)
HCT: 30 % — ABNORMAL LOW (ref 39.0–52.0)
HCT: 43.8 % (ref 39.0–52.0)
Hemoglobin: 10.1 g/dL — ABNORMAL LOW (ref 13.0–17.0)
Hemoglobin: 11.7 g/dL — ABNORMAL LOW (ref 13.0–17.0)
Hemoglobin: 13.9 g/dL (ref 13.0–17.0)
Hemoglobin: 7.6 g/dL — CL (ref 13.0–17.0)
Hemoglobin: 9.4 g/dL — ABNORMAL LOW (ref 13.0–17.0)
Hemoglobin: 9.6 g/dL — ABNORMAL LOW (ref 13.0–17.0)
Hemoglobin: 9.8 g/dL — ABNORMAL LOW (ref 13.0–17.0)
MCHC: 31.9 g/dL (ref 30.0–36.0)
MCHC: 32.6 g/dL (ref 30.0–36.0)
MCHC: 32.6 g/dL (ref 30.0–36.0)
MCHC: 32.8 g/dL (ref 30.0–36.0)
MCHC: 32.9 g/dL (ref 30.0–36.0)
Platelets: 428 10*3/uL — ABNORMAL HIGH (ref 150–400)
Platelets: 571 10*3/uL — ABNORMAL HIGH (ref 150–400)
Platelets: 755 10*3/uL — ABNORMAL HIGH (ref 150–400)
RBC: 3.07 MIL/uL — ABNORMAL LOW (ref 4.22–5.81)
RBC: 3.61 MIL/uL — ABNORMAL LOW (ref 4.22–5.81)
RBC: 3.63 MIL/uL — ABNORMAL LOW (ref 4.22–5.81)
RBC: 4.6 MIL/uL (ref 4.22–5.81)
RDW: 22.7 % — ABNORMAL HIGH (ref 11.5–15.5)
RDW: 23.1 % — ABNORMAL HIGH (ref 11.5–15.5)
RDW: 24.1 % — ABNORMAL HIGH (ref 11.5–15.5)
RDW: 24.5 % — ABNORMAL HIGH (ref 11.5–15.5)
RDW: 24.5 % — ABNORMAL HIGH (ref 11.5–15.5)
WBC: 30.2 10*3/uL — ABNORMAL HIGH (ref 4.0–10.5)
WBC: 32.5 10*3/uL — ABNORMAL HIGH (ref 4.0–10.5)
WBC: 6.4 10*3/uL (ref 4.0–10.5)
WBC: 6.9 10*3/uL (ref 4.0–10.5)

## 2011-09-10 LAB — BASIC METABOLIC PANEL
BUN: 9 mg/dL (ref 6–23)
CO2: 25 mEq/L (ref 19–32)
CO2: 27 mEq/L (ref 19–32)
CO2: 27 mEq/L (ref 19–32)
Calcium: 7.9 mg/dL — ABNORMAL LOW (ref 8.4–10.5)
Calcium: 7.9 mg/dL — ABNORMAL LOW (ref 8.4–10.5)
Calcium: 8.5 mg/dL (ref 8.4–10.5)
Calcium: 9.4 mg/dL (ref 8.4–10.5)
Creatinine, Ser: 0.59 mg/dL (ref 0.4–1.5)
GFR calc Af Amer: 52 mL/min — ABNORMAL LOW (ref >60)
GFR calc Af Amer: >60 mL/min (ref >60)
GFR calc Af Amer: >60 mL/min (ref >60)
GFR calc Af Amer: >60 mL/min (ref >60)
GFR calc non Af Amer: 43 mL/min — ABNORMAL LOW (ref >60)
GFR calc non Af Amer: >60 mL/min (ref >60)
GFR calc non Af Amer: >60 mL/min (ref >60)
Glucose, Bld: 103 mg/dL — ABNORMAL HIGH (ref 70–99)
Glucose, Bld: 104 mg/dL — ABNORMAL HIGH (ref 70–99)
Glucose, Bld: 109 mg/dL — ABNORMAL HIGH (ref 70–99)
Glucose, Bld: 85 mg/dL (ref 70–99)
Potassium: 3.7 mEq/L (ref 3.5–5.1)
Potassium: 3.7 mEq/L (ref 3.5–5.1)
Potassium: 3.8 mEq/L (ref 3.5–5.1)
Potassium: 4.4 mEq/L (ref 3.5–5.1)
Sodium: 132 mEq/L — ABNORMAL LOW (ref 135–145)
Sodium: 136 mEq/L (ref 135–145)
Sodium: 137 mEq/L (ref 135–145)
Sodium: 138 mEq/L (ref 135–145)
Sodium: 139 mEq/L (ref 135–145)

## 2011-09-10 LAB — COMPREHENSIVE METABOLIC PANEL
AST: 24 U/L (ref 0–37)
Albumin: 2.6 g/dL — ABNORMAL LOW (ref 3.5–5.2)
Alkaline Phosphatase: 88 U/L (ref 39–117)
Chloride: 100 mEq/L (ref 96–112)
Creatinine, Ser: 1.72 mg/dL — ABNORMAL HIGH (ref 0.4–1.5)
GFR calc Af Amer: 49 mL/min — ABNORMAL LOW (ref >60)
Potassium: 4.5 mEq/L (ref 3.5–5.1)
Total Bilirubin: 0.4 mg/dL (ref 0.3–1.2)

## 2011-09-10 LAB — PROTIME-INR
INR: 1.2 (ref 0.00–1.49)
INR: 1.3 (ref 0.00–1.49)
INR: 1.3 (ref 0.00–1.49)
Prothrombin Time: 15.3 seconds — ABNORMAL HIGH (ref 11.6–15.2)
Prothrombin Time: 16.3 seconds — ABNORMAL HIGH (ref 11.6–15.2)
Prothrombin Time: 16.3 seconds — ABNORMAL HIGH (ref 11.6–15.2)

## 2011-09-10 LAB — DIFFERENTIAL
Basophils Absolute: 0 10*3/uL (ref 0.0–0.1)
Eosinophils Relative: 0 % (ref 0–5)
Monocytes Absolute: 1.9 10*3/uL — ABNORMAL HIGH (ref 0.1–1.0)
Monocytes Relative: 8 % (ref 3–12)
Neutrophils Relative %: 88 % — ABNORMAL HIGH (ref 43–77)

## 2011-09-10 LAB — URINE CULTURE: Colony Count: >=100000

## 2011-09-10 LAB — CULTURE, BLOOD (ROUTINE X 2)

## 2011-09-10 LAB — URINALYSIS, ROUTINE W REFLEX MICROSCOPIC
Specific Gravity, Urine: 1.024 (ref 1.005–1.030)
Urobilinogen, UA: 0.2 mg/dL (ref 0.0–1.0)

## 2011-09-10 LAB — OCCULT BLOOD X 1 CARD TO LAB, STOOL: Fecal Occult Bld: POSITIVE

## 2011-09-10 LAB — URINE MICROSCOPIC-ADD ON

## 2011-09-10 LAB — GASTRIC OCCULT BLOOD (1-CARD TO LAB): Occult Blood, Gastric: POSITIVE — AB

## 2011-09-10 LAB — PREPARE RBC (CROSSMATCH)

## 2011-09-10 LAB — LACTIC ACID, PLASMA: Lactic Acid, Venous: 4.6 mmol/L — ABNORMAL HIGH (ref 0.5–2.2)

## 2011-09-10 LAB — APTT: aPTT: 37 seconds (ref 24–37)

## 2011-09-11 LAB — TYPE AND SCREEN

## 2011-09-12 ENCOUNTER — Ambulatory Visit (INDEPENDENT_AMBULATORY_CARE_PROVIDER_SITE_OTHER): Payer: BC Managed Care – PPO | Admitting: Internal Medicine

## 2011-09-12 ENCOUNTER — Encounter: Payer: Self-pay | Admitting: Internal Medicine

## 2011-09-12 VITALS — BP 108/70 | HR 58 | Temp 97.8°F

## 2011-09-12 DIAGNOSIS — G894 Chronic pain syndrome: Secondary | ICD-10-CM

## 2011-09-12 DIAGNOSIS — Z23 Encounter for immunization: Secondary | ICD-10-CM

## 2011-09-12 MED ORDER — OXYCODONE-ACETAMINOPHEN 10-325 MG PO TABS: 1.0000 | ORAL_TABLET | ORAL | Status: DC | PRN

## 2011-09-12 MED ORDER — HYDROCHLOROTHIAZIDE 25 MG PO TABS: 25.0000 mg | ORAL_TABLET | Freq: Every day | ORAL | Status: DC

## 2011-09-12 MED ORDER — MUPIROCIN CALCIUM 2 % EX CREA: TOPICAL_CREAM | CUTANEOUS | Status: DC

## 2011-09-12 MED ORDER — KETOROLAC TROMETHAMINE 30 MG/ML IJ SOLN: 30.0000 mg | Freq: Once | INTRAMUSCULAR | Status: DC

## 2011-09-12 MED ORDER — DICLOFENAC SODIUM 1 % TD GEL: 1.0000 "application " | Freq: Four times a day (QID) | TRANSDERMAL | Status: DC | PRN

## 2011-09-12 MED ORDER — OXYMORPHONE HCL ER 20 MG PO T12A: 1.0000 | EXTENDED_RELEASE_TABLET | Freq: Two times a day (BID) | ORAL | Status: DC

## 2011-09-12 MED ORDER — ZOLPIDEM TARTRATE ER 12.5 MG PO TBCR: 12.5000 mg | EXTENDED_RELEASE_TABLET | Freq: Every day | ORAL | Status: DC

## 2011-09-12 MED ORDER — GABAPENTIN 300 MG PO CAPS: 300.0000 mg | ORAL_CAPSULE | Freq: Three times a day (TID) | ORAL | Status: DC

## 2011-09-12 MED ORDER — KETOROLAC TROMETHAMINE 30 MG/ML IJ SOLN
30.0000 mg | Freq: Once | INTRAMUSCULAR | Status: AC
  Administered 2011-09-12: 30 mg via INTRAMUSCULAR

## 2011-09-13 LAB — COMPREHENSIVE METABOLIC PANEL WITH GFR
ALT: 9 U/L (ref 0–53)
ALT: <8 U/L (ref 0–53)
AST: 13 U/L (ref 0–37)
AST: 20 U/L (ref 0–37)
Albumin: 2.5 g/dL — ABNORMAL LOW (ref 3.5–5.2)
Albumin: 2.9 g/dL — ABNORMAL LOW (ref 3.5–5.2)
Alkaline Phosphatase: 66 U/L (ref 39–117)
Alkaline Phosphatase: 80 U/L (ref 39–117)
BUN: 11 mg/dL (ref 6–23)
BUN: 7 mg/dL (ref 6–23)
CO2: 22 meq/L (ref 19–32)
CO2: 22 meq/L (ref 19–32)
Calcium: 8.3 mg/dL — ABNORMAL LOW (ref 8.4–10.5)
Calcium: 8.6 mg/dL (ref 8.4–10.5)
Chloride: 103 meq/L (ref 96–112)
Chloride: 111 meq/L (ref 96–112)
Creatinine, Ser: 0.59 mg/dL (ref 0.4–1.5)
Creatinine, Ser: 0.66 mg/dL (ref 0.4–1.5)
GFR calc non Af Amer: >60 mL/min
GFR calc non Af Amer: >60 mL/min
Glucose, Bld: 110 mg/dL — ABNORMAL HIGH (ref 70–99)
Glucose, Bld: 121 mg/dL — ABNORMAL HIGH (ref 70–99)
Potassium: 3.1 meq/L — ABNORMAL LOW (ref 3.5–5.1)
Potassium: 4.1 meq/L (ref 3.5–5.1)
Sodium: 134 meq/L — ABNORMAL LOW (ref 135–145)
Sodium: 139 meq/L (ref 135–145)
Total Bilirubin: 1.4 mg/dL — ABNORMAL HIGH (ref 0.3–1.2)
Total Bilirubin: <0.1 mg/dL — ABNORMAL LOW (ref 0.3–1.2)
Total Protein: 5.5 g/dL — ABNORMAL LOW (ref 6.0–8.3)
Total Protein: 6.4 g/dL (ref 6.0–8.3)

## 2011-09-13 LAB — URINALYSIS, ROUTINE W REFLEX MICROSCOPIC
Bilirubin Urine: NEGATIVE
Bilirubin Urine: NEGATIVE
Bilirubin Urine: NEGATIVE
Glucose, UA: NEGATIVE mg/dL
Glucose, UA: NEGATIVE mg/dL
Glucose, UA: NEGATIVE mg/dL
Hgb urine dipstick: NEGATIVE
Hgb urine dipstick: NEGATIVE
Ketones, ur: 15 mg/dL — AB
Ketones, ur: NEGATIVE mg/dL
Ketones, ur: NEGATIVE mg/dL
Nitrite: NEGATIVE
Nitrite: NEGATIVE
Nitrite: POSITIVE — AB
Protein, ur: NEGATIVE mg/dL
Protein, ur: NEGATIVE mg/dL
Specific Gravity, Urine: 1.009 (ref 1.005–1.030)
Specific Gravity, Urine: 1.015 (ref 1.005–1.030)
Specific Gravity, Urine: 1.018 (ref 1.005–1.030)
Urobilinogen, UA: 0.2 mg/dL (ref 0.0–1.0)
Urobilinogen, UA: 0.2 mg/dL (ref 0.0–1.0)
pH: 5.5 (ref 5.0–8.0)
pH: 6 (ref 5.0–8.0)
pH: 6 (ref 5.0–8.0)

## 2011-09-13 LAB — PHOSPHORUS
Phosphorus: 2.5 mg/dL (ref 2.3–4.6)
Phosphorus: 2.5 mg/dL (ref 2.3–4.6)
Phosphorus: 2.7 mg/dL (ref 2.3–4.6)

## 2011-09-13 LAB — CBC
HCT: 26.2 % — ABNORMAL LOW (ref 39.0–52.0)
HCT: 26.7 % — ABNORMAL LOW (ref 39.0–52.0)
HCT: 27.2 % — ABNORMAL LOW (ref 39.0–52.0)
HCT: 28.9 % — ABNORMAL LOW (ref 39.0–52.0)
HCT: 31.6 % — ABNORMAL LOW (ref 39.0–52.0)
HCT: 32 % — ABNORMAL LOW (ref 39.0–52.0)
HCT: 34.2 % — ABNORMAL LOW (ref 39.0–52.0)
HCT: 34.7 % — ABNORMAL LOW (ref 39.0–52.0)
HCT: 39 % (ref 39.0–52.0)
Hemoglobin: 10.3 g/dL — ABNORMAL LOW (ref 13.0–17.0)
Hemoglobin: 11.1 g/dL — ABNORMAL LOW (ref 13.0–17.0)
Hemoglobin: 11.2 g/dL — ABNORMAL LOW (ref 13.0–17.0)
Hemoglobin: 11.9 g/dL — ABNORMAL LOW (ref 13.0–17.0)
Hemoglobin: 12.6 g/dL — ABNORMAL LOW (ref 13.0–17.0)
Hemoglobin: 8.6 g/dL — ABNORMAL LOW (ref 13.0–17.0)
Hemoglobin: 8.8 g/dL — ABNORMAL LOW (ref 13.0–17.0)
Hemoglobin: 8.9 g/dL — ABNORMAL LOW (ref 13.0–17.0)
Hemoglobin: 9.2 g/dL — ABNORMAL LOW (ref 13.0–17.0)
Hemoglobin: 9.4 g/dL — ABNORMAL LOW (ref 13.0–17.0)
Hemoglobin: 9.6 g/dL — ABNORMAL LOW (ref 13.0–17.0)
MCHC: 32.1 g/dL (ref 30.0–36.0)
MCHC: 32.3 g/dL (ref 30.0–36.0)
MCHC: 32.3 g/dL (ref 30.0–36.0)
MCHC: 32.5 g/dL (ref 30.0–36.0)
MCHC: 32.5 g/dL (ref 30.0–36.0)
MCHC: 32.6 g/dL (ref 30.0–36.0)
MCHC: 32.6 g/dL (ref 30.0–36.0)
MCHC: 32.8 g/dL (ref 30.0–36.0)
MCHC: 32.8 g/dL (ref 30.0–36.0)
MCHC: 32.9 g/dL (ref 30.0–36.0)
MCHC: 33.4 g/dL (ref 30.0–36.0)
MCHC: 34 g/dL (ref 30.0–36.0)
MCV: 79 fL (ref 78.0–100.0)
MCV: 79.1 fL (ref 78.0–100.0)
MCV: 79.1 fL (ref 78.0–100.0)
MCV: 79.7 fL (ref 78.0–100.0)
MCV: 79.7 fL (ref 78.0–100.0)
MCV: 80.1 fL (ref 78.0–100.0)
MCV: 81.2 fL (ref 78.0–100.0)
MCV: 83.6 fL (ref 78.0–100.0)
MCV: 83.7 fL (ref 78.0–100.0)
MCV: 83.8 fL (ref 78.0–100.0)
Platelets: 234 K/uL (ref 150–400)
Platelets: 256 K/uL (ref 150–400)
Platelets: 306 K/uL (ref 150–400)
Platelets: 420 K/uL — ABNORMAL HIGH (ref 150–400)
Platelets: 432 10*3/uL — ABNORMAL HIGH (ref 150–400)
Platelets: 436 K/uL — ABNORMAL HIGH (ref 150–400)
Platelets: 461 10*3/uL — ABNORMAL HIGH (ref 150–400)
Platelets: 483 10*3/uL — ABNORMAL HIGH (ref 150–400)
Platelets: 493 10*3/uL — ABNORMAL HIGH (ref 150–400)
Platelets: 529 10*3/uL — ABNORMAL HIGH (ref 150–400)
RBC: 3.28 MIL/uL — ABNORMAL LOW (ref 4.22–5.81)
RBC: 3.3 MIL/uL — ABNORMAL LOW (ref 4.22–5.81)
RBC: 3.33 MIL/uL — ABNORMAL LOW (ref 4.22–5.81)
RBC: 3.4 MIL/uL — ABNORMAL LOW (ref 4.22–5.81)
RBC: 3.41 MIL/uL — ABNORMAL LOW (ref 4.22–5.81)
RBC: 3.43 MIL/uL — ABNORMAL LOW (ref 4.22–5.81)
RBC: 3.55 MIL/uL — ABNORMAL LOW (ref 4.22–5.81)
RBC: 3.72 MIL/uL — ABNORMAL LOW (ref 4.22–5.81)
RBC: 3.78 MIL/uL — ABNORMAL LOW (ref 4.22–5.81)
RBC: 3.84 MIL/uL — ABNORMAL LOW (ref 4.22–5.81)
RBC: 3.92 MIL/uL — ABNORMAL LOW (ref 4.22–5.81)
RBC: 4.1 MIL/uL — ABNORMAL LOW (ref 4.22–5.81)
RBC: 4.6 MIL/uL (ref 4.22–5.81)
RBC: 4.65 MIL/uL (ref 4.22–5.81)
RDW: 23.7 % — ABNORMAL HIGH (ref 11.5–15.5)
RDW: 24 % — ABNORMAL HIGH (ref 11.5–15.5)
RDW: 24.2 % — ABNORMAL HIGH (ref 11.5–15.5)
RDW: 24.4 % — ABNORMAL HIGH (ref 11.5–15.5)
RDW: 24.6 % — ABNORMAL HIGH (ref 11.5–15.5)
RDW: 24.6 % — ABNORMAL HIGH (ref 11.5–15.5)
RDW: 24.6 % — ABNORMAL HIGH (ref 11.5–15.5)
RDW: 24.9 % — ABNORMAL HIGH (ref 11.5–15.5)
RDW: 26.8 % — ABNORMAL HIGH (ref 11.5–15.5)
RDW: 26.8 % — ABNORMAL HIGH (ref 11.5–15.5)
WBC: 10.8 10*3/uL — ABNORMAL HIGH (ref 4.0–10.5)
WBC: 12 10*3/uL — ABNORMAL HIGH (ref 4.0–10.5)
WBC: 12.1 10*3/uL — ABNORMAL HIGH (ref 4.0–10.5)
WBC: 12.3 10*3/uL — ABNORMAL HIGH (ref 4.0–10.5)
WBC: 22.9 K/uL — ABNORMAL HIGH (ref 4.0–10.5)
WBC: 5.4 K/uL (ref 4.0–10.5)
WBC: 5.7 K/uL (ref 4.0–10.5)
WBC: 6 10*3/uL (ref 4.0–10.5)
WBC: 6.9 10*3/uL (ref 4.0–10.5)
WBC: 7.6 K/uL (ref 4.0–10.5)
WBC: 8.5 K/uL (ref 4.0–10.5)

## 2011-09-13 LAB — BASIC METABOLIC PANEL
BUN: 11 mg/dL (ref 6–23)
BUN: 16 mg/dL (ref 6–23)
BUN: 22 mg/dL (ref 6–23)
BUN: 23 mg/dL (ref 6–23)
BUN: 24 mg/dL — ABNORMAL HIGH (ref 6–23)
BUN: 28 mg/dL — ABNORMAL HIGH (ref 6–23)
BUN: 7 mg/dL (ref 6–23)
BUN: 9 mg/dL (ref 6–23)
CO2: 22 mEq/L (ref 19–32)
CO2: 22 mEq/L (ref 19–32)
CO2: 23 mEq/L (ref 19–32)
CO2: 23 mEq/L (ref 19–32)
Calcium: 6.8 mg/dL — ABNORMAL LOW (ref 8.4–10.5)
Calcium: 7.5 mg/dL — ABNORMAL LOW (ref 8.4–10.5)
Calcium: 7.7 mg/dL — ABNORMAL LOW (ref 8.4–10.5)
Calcium: 7.9 mg/dL — ABNORMAL LOW (ref 8.4–10.5)
Calcium: 7.9 mg/dL — ABNORMAL LOW (ref 8.4–10.5)
Calcium: 8 mg/dL — ABNORMAL LOW (ref 8.4–10.5)
Calcium: 8.1 mg/dL — ABNORMAL LOW (ref 8.4–10.5)
Chloride: 108 mEq/L (ref 96–112)
Chloride: 114 mEq/L — ABNORMAL HIGH (ref 96–112)
Creatinine, Ser: 0.44 mg/dL (ref 0.4–1.5)
Creatinine, Ser: 0.47 mg/dL (ref 0.4–1.5)
Creatinine, Ser: 0.54 mg/dL (ref 0.4–1.5)
Creatinine, Ser: 0.56 mg/dL (ref 0.4–1.5)
Creatinine, Ser: 0.58 mg/dL (ref 0.4–1.5)
Creatinine, Ser: 0.6 mg/dL (ref 0.4–1.5)
Creatinine, Ser: 0.77 mg/dL (ref 0.4–1.5)
Creatinine, Ser: 0.78 mg/dL (ref 0.4–1.5)
GFR calc Af Amer: >60 mL/min (ref >60)
GFR calc Af Amer: >60 mL/min (ref >60)
GFR calc non Af Amer: >60 mL/min (ref >60)
GFR calc non Af Amer: >60 mL/min (ref >60)
GFR calc non Af Amer: >60 mL/min (ref >60)
GFR calc non Af Amer: >60 mL/min (ref >60)
GFR calc non Af Amer: >60 mL/min (ref >60)
GFR calc non Af Amer: >60 mL/min (ref >60)
GFR calc non Af Amer: >60 mL/min (ref >60)
GFR calc non Af Amer: >60 mL/min (ref >60)
Glucose, Bld: 102 mg/dL — ABNORMAL HIGH (ref 70–99)
Glucose, Bld: 103 mg/dL — ABNORMAL HIGH (ref 70–99)
Glucose, Bld: 123 mg/dL — ABNORMAL HIGH (ref 70–99)
Glucose, Bld: 126 mg/dL — ABNORMAL HIGH (ref 70–99)
Glucose, Bld: 133 mg/dL — ABNORMAL HIGH (ref 70–99)
Glucose, Bld: 58 mg/dL — ABNORMAL LOW (ref 70–99)
Glucose, Bld: 92 mg/dL (ref 70–99)
Potassium: 2.9 mEq/L — ABNORMAL LOW (ref 3.5–5.1)
Potassium: 3.3 mEq/L — ABNORMAL LOW (ref 3.5–5.1)
Potassium: 3.3 mEq/L — ABNORMAL LOW (ref 3.5–5.1)
Potassium: 3.8 mEq/L (ref 3.5–5.1)
Potassium: 3.9 mEq/L (ref 3.5–5.1)
Sodium: 141 mEq/L (ref 135–145)
Sodium: 144 mEq/L (ref 135–145)

## 2011-09-13 LAB — BASIC METABOLIC PANEL WITH GFR
BUN: 1 mg/dL — ABNORMAL LOW (ref 6–23)
BUN: 10 mg/dL (ref 6–23)
BUN: 11 mg/dL (ref 6–23)
BUN: 5 mg/dL — ABNORMAL LOW (ref 6–23)
BUN: 8 mg/dL (ref 6–23)
BUN: <1 mg/dL — ABNORMAL LOW (ref 6–23)
CO2: 19 meq/L (ref 19–32)
CO2: 21 meq/L (ref 19–32)
CO2: 22 meq/L (ref 19–32)
CO2: 23 meq/L (ref 19–32)
CO2: 23 meq/L (ref 19–32)
CO2: 24 meq/L (ref 19–32)
Calcium: 7.7 mg/dL — ABNORMAL LOW (ref 8.4–10.5)
Calcium: 7.8 mg/dL — ABNORMAL LOW (ref 8.4–10.5)
Calcium: 7.9 mg/dL — ABNORMAL LOW (ref 8.4–10.5)
Calcium: 8.1 mg/dL — ABNORMAL LOW (ref 8.4–10.5)
Calcium: 8.1 mg/dL — ABNORMAL LOW (ref 8.4–10.5)
Calcium: 8.4 mg/dL (ref 8.4–10.5)
Chloride: 103 meq/L (ref 96–112)
Chloride: 107 meq/L (ref 96–112)
Chloride: 107 meq/L (ref 96–112)
Chloride: 108 meq/L (ref 96–112)
Chloride: 109 meq/L (ref 96–112)
Chloride: 109 meq/L (ref 96–112)
Creatinine, Ser: 0.42 mg/dL (ref 0.4–1.5)
Creatinine, Ser: 0.52 mg/dL (ref 0.4–1.5)
Creatinine, Ser: 0.55 mg/dL (ref 0.4–1.5)
Creatinine, Ser: 0.59 mg/dL (ref 0.4–1.5)
Creatinine, Ser: 0.61 mg/dL (ref 0.4–1.5)
Creatinine, Ser: 0.63 mg/dL (ref 0.4–1.5)
GFR calc non Af Amer: >60 mL/min
GFR calc non Af Amer: >60 mL/min
GFR calc non Af Amer: >60 mL/min
GFR calc non Af Amer: >60 mL/min
GFR calc non Af Amer: >60 mL/min
GFR calc non Af Amer: >60 mL/min
Glucose, Bld: 102 mg/dL — ABNORMAL HIGH (ref 70–99)
Glucose, Bld: 112 mg/dL — ABNORMAL HIGH (ref 70–99)
Glucose, Bld: 133 mg/dL — ABNORMAL HIGH (ref 70–99)
Glucose, Bld: 133 mg/dL — ABNORMAL HIGH (ref 70–99)
Glucose, Bld: 146 mg/dL — ABNORMAL HIGH (ref 70–99)
Glucose, Bld: 92 mg/dL (ref 70–99)
Potassium: 2.4 meq/L — CL (ref 3.5–5.1)
Potassium: 3.3 meq/L — ABNORMAL LOW (ref 3.5–5.1)
Potassium: 3.6 meq/L (ref 3.5–5.1)
Potassium: 3.6 meq/L (ref 3.5–5.1)
Potassium: 3.7 meq/L (ref 3.5–5.1)
Potassium: 3.8 meq/L (ref 3.5–5.1)
Sodium: 133 meq/L — ABNORMAL LOW (ref 135–145)
Sodium: 134 meq/L — ABNORMAL LOW (ref 135–145)
Sodium: 135 meq/L (ref 135–145)
Sodium: 136 meq/L (ref 135–145)
Sodium: 136 meq/L (ref 135–145)
Sodium: 138 meq/L (ref 135–145)

## 2011-09-13 LAB — CLOSTRIDIUM DIFFICILE EIA
C difficile Toxins A+B, EIA: NEGATIVE
C difficile Toxins A+B, EIA: NEGATIVE
C difficile Toxins A+B, EIA: NEGATIVE

## 2011-09-13 LAB — URINE CULTURE
Colony Count: >=100000
Colony Count: NO GROWTH
Culture: NO GROWTH
Special Requests: NEGATIVE

## 2011-09-13 LAB — GLUCOSE, CAPILLARY
Glucose-Capillary: 102 mg/dL — ABNORMAL HIGH (ref 70–99)
Glucose-Capillary: 106 mg/dL — ABNORMAL HIGH (ref 70–99)
Glucose-Capillary: 116 mg/dL — ABNORMAL HIGH (ref 70–99)
Glucose-Capillary: 119 mg/dL — ABNORMAL HIGH (ref 70–99)
Glucose-Capillary: 122 mg/dL — ABNORMAL HIGH (ref 70–99)
Glucose-Capillary: 123 mg/dL — ABNORMAL HIGH (ref 70–99)
Glucose-Capillary: 123 mg/dL — ABNORMAL HIGH (ref 70–99)
Glucose-Capillary: 124 mg/dL — ABNORMAL HIGH (ref 70–99)
Glucose-Capillary: 132 mg/dL — ABNORMAL HIGH (ref 70–99)
Glucose-Capillary: 134 mg/dL — ABNORMAL HIGH (ref 70–99)
Glucose-Capillary: 135 mg/dL — ABNORMAL HIGH (ref 70–99)
Glucose-Capillary: 139 mg/dL — ABNORMAL HIGH (ref 70–99)
Glucose-Capillary: 140 mg/dL — ABNORMAL HIGH (ref 70–99)
Glucose-Capillary: 142 mg/dL — ABNORMAL HIGH (ref 70–99)
Glucose-Capillary: 142 mg/dL — ABNORMAL HIGH (ref 70–99)
Glucose-Capillary: 142 mg/dL — ABNORMAL HIGH (ref 70–99)
Glucose-Capillary: 152 mg/dL — ABNORMAL HIGH (ref 70–99)
Glucose-Capillary: 154 mg/dL — ABNORMAL HIGH (ref 70–99)
Glucose-Capillary: 157 mg/dL — ABNORMAL HIGH (ref 70–99)
Glucose-Capillary: 159 mg/dL — ABNORMAL HIGH (ref 70–99)

## 2011-09-13 LAB — COMPREHENSIVE METABOLIC PANEL
ALT: 11 U/L (ref 0–53)
ALT: 11 U/L (ref 0–53)
AST: 11 U/L (ref 0–37)
AST: 14 U/L (ref 0–37)
Alkaline Phosphatase: 53 U/L (ref 39–117)
Alkaline Phosphatase: 70 U/L (ref 39–117)
CO2: 22 mEq/L (ref 19–32)
CO2: 23 mEq/L (ref 19–32)
CO2: 23 mEq/L (ref 19–32)
Calcium: 8 mg/dL — ABNORMAL LOW (ref 8.4–10.5)
Calcium: 8.1 mg/dL — ABNORMAL LOW (ref 8.4–10.5)
Chloride: 105 mEq/L (ref 96–112)
Chloride: 113 mEq/L — ABNORMAL HIGH (ref 96–112)
Creatinine, Ser: 0.48 mg/dL (ref 0.4–1.5)
GFR calc Af Amer: >60 mL/min (ref >60)
GFR calc Af Amer: >60 mL/min (ref >60)
GFR calc non Af Amer: >60 mL/min (ref >60)
GFR calc non Af Amer: >60 mL/min (ref >60)
GFR calc non Af Amer: >60 mL/min (ref >60)
Glucose, Bld: 120 mg/dL — ABNORMAL HIGH (ref 70–99)
Glucose, Bld: 124 mg/dL — ABNORMAL HIGH (ref 70–99)
Glucose, Bld: 135 mg/dL — ABNORMAL HIGH (ref 70–99)
Potassium: 2.8 mEq/L — ABNORMAL LOW (ref 3.5–5.1)
Potassium: 3.5 mEq/L (ref 3.5–5.1)
Sodium: 139 mEq/L (ref 135–145)
Sodium: 143 mEq/L (ref 135–145)
Total Bilirubin: 0.4 mg/dL (ref 0.3–1.2)
Total Bilirubin: 0.5 mg/dL (ref 0.3–1.2)
Total Protein: 4.7 g/dL — ABNORMAL LOW (ref 6.0–8.3)

## 2011-09-13 LAB — DIFFERENTIAL
Basophils Absolute: 0 10*3/uL (ref 0.0–0.1)
Basophils Absolute: 0 K/uL (ref 0.0–0.1)
Basophils Absolute: 0.1 K/uL (ref 0.0–0.1)
Basophils Relative: 0 % (ref 0–1)
Basophils Relative: 0 % (ref 0–1)
Basophils Relative: 1 % (ref 0–1)
Eosinophils Absolute: 0 10*3/uL (ref 0.0–0.7)
Eosinophils Absolute: 0 K/uL (ref 0.0–0.7)
Eosinophils Absolute: 0.3 K/uL (ref 0.0–0.7)
Eosinophils Relative: 0 % (ref 0–5)
Eosinophils Relative: 2 % (ref 0–5)
Eosinophils Relative: 4 % (ref 0–5)
Lymphocytes Relative: 32 % (ref 12–46)
Lymphocytes Relative: 6 % — ABNORMAL LOW (ref 12–46)
Lymphocytes Relative: 7 % — ABNORMAL LOW (ref 12–46)
Lymphs Abs: 0.9 10*3/uL (ref 0.7–4.0)
Lymphs Abs: 1.3 10*3/uL (ref 0.7–4.0)
Lymphs Abs: 1.4 K/uL (ref 0.7–4.0)
Lymphs Abs: 1.5 10*3/uL (ref 0.7–4.0)
Lymphs Abs: 2.1 K/uL (ref 0.7–4.0)
Monocytes Absolute: 0.5 K/uL (ref 0.1–1.0)
Monocytes Absolute: 0.8 10*3/uL (ref 0.1–1.0)
Monocytes Absolute: 1 10*3/uL (ref 0.1–1.0)
Monocytes Absolute: 1.5 10*3/uL — ABNORMAL HIGH (ref 0.1–1.0)
Monocytes Absolute: 1.8 K/uL — ABNORMAL HIGH (ref 0.1–1.0)
Monocytes Relative: 11 % (ref 3–12)
Monocytes Relative: 8 % (ref 3–12)
Monocytes Relative: 8 % (ref 3–12)
Monocytes Relative: 8 % (ref 3–12)
Neutro Abs: 19.7 K/uL — ABNORMAL HIGH (ref 1.7–7.7)
Neutro Abs: 3.5 K/uL (ref 1.7–7.7)
Neutro Abs: 4.4 10*3/uL (ref 1.7–7.7)
Neutro Abs: 9.5 10*3/uL — ABNORMAL HIGH (ref 1.7–7.7)
Neutro Abs: 9.9 10*3/uL — ABNORMAL HIGH (ref 1.7–7.7)
Neutrophils Relative %: 55 % (ref 43–77)
Neutrophils Relative %: 86 % — ABNORMAL HIGH (ref 43–77)

## 2011-09-13 LAB — CULTURE, BLOOD (ROUTINE X 2)
Culture: NO GROWTH
Culture: NO GROWTH

## 2011-09-13 LAB — CROSSMATCH: ABO/RH(D): O POS

## 2011-09-13 LAB — OCCULT BLOOD X 1 CARD TO LAB, STOOL
Fecal Occult Bld: NEGATIVE
Fecal Occult Bld: NEGATIVE

## 2011-09-13 LAB — URINE MICROSCOPIC-ADD ON

## 2011-09-13 LAB — MAGNESIUM
Magnesium: 1.6 mg/dL (ref 1.5–2.5)
Magnesium: 1.6 mg/dL (ref 1.5–2.5)
Magnesium: 1.7 mg/dL (ref 1.5–2.5)
Magnesium: 2 mg/dL (ref 1.5–2.5)
Magnesium: 2 mg/dL (ref 1.5–2.5)

## 2011-09-13 LAB — STOOL CULTURE

## 2011-09-13 LAB — PREALBUMIN
Prealbumin: 15 mg/dL — ABNORMAL LOW (ref 18.0–45.0)
Prealbumin: 17 mg/dL — ABNORMAL LOW (ref 18.0–45.0)

## 2011-09-13 LAB — HEPATIC FUNCTION PANEL
ALT: 11 U/L (ref 0–53)
Albumin: 2.1 g/dL — ABNORMAL LOW (ref 3.5–5.2)
Alkaline Phosphatase: 62 U/L (ref 39–117)
Indirect Bilirubin: 1 mg/dL — ABNORMAL HIGH (ref 0.3–0.9)
Total Protein: 5.3 g/dL — ABNORMAL LOW (ref 6.0–8.3)

## 2011-09-13 LAB — TRIGLYCERIDES: Triglycerides: 120 mg/dL

## 2011-09-13 LAB — CHOLESTEROL, TOTAL

## 2011-09-13 LAB — LIPASE, BLOOD: Lipase: 17 U/L (ref 11–59)

## 2011-09-13 LAB — LACTIC ACID, PLASMA: Lactic Acid, Venous: 1.1 mmol/L (ref 0.5–2.2)

## 2011-09-16 LAB — CBC
HCT: 31.5 — ABNORMAL LOW
HCT: 32.9 — ABNORMAL LOW
HCT: 34.9 — ABNORMAL LOW
HCT: 34.9 — ABNORMAL LOW
Hemoglobin: 10.8 — ABNORMAL LOW
Hemoglobin: 11.5 — ABNORMAL LOW
Hemoglobin: 11.6 — ABNORMAL LOW
Hemoglobin: 11.8 — ABNORMAL LOW
Hemoglobin: 12 — ABNORMAL LOW
MCHC: 34.3
MCHC: 34.5
MCHC: 35.6
MCV: 88.4
MCV: 89.2
MCV: 89.9
MCV: 90
Platelets: 409 — ABNORMAL HIGH
Platelets: 514 — ABNORMAL HIGH
Platelets: 559 — ABNORMAL HIGH
Platelets: 565 — ABNORMAL HIGH
Platelets: 582 — ABNORMAL HIGH
RBC: 3.49 — ABNORMAL LOW
RBC: 3.66 — ABNORMAL LOW
RBC: 3.68 — ABNORMAL LOW
RBC: 3.73 — ABNORMAL LOW
RDW: 14.7
RDW: 15.1
RDW: 15.1
WBC: 11.2 — ABNORMAL HIGH
WBC: 8.1
WBC: 8.2
WBC: 8.3
WBC: 9.4
WBC: 9.6

## 2011-09-16 LAB — BASIC METABOLIC PANEL
BUN: 13
BUN: 8
BUN: 8
CO2: 23
Calcium: 8.1 — ABNORMAL LOW
Calcium: 9
Chloride: 101
Chloride: 103
Chloride: 104
Creatinine, Ser: 0.85
GFR calc Af Amer: >60
GFR calc non Af Amer: >60
GFR calc non Af Amer: >60
Glucose, Bld: 112 — ABNORMAL HIGH
Glucose, Bld: 117 — ABNORMAL HIGH
Glucose, Bld: 125 — ABNORMAL HIGH
Potassium: 3.2 — ABNORMAL LOW
Potassium: 4.1
Sodium: 136

## 2011-09-16 LAB — RENAL FUNCTION PANEL
Albumin: 2.4 — ABNORMAL LOW
BUN: 10
CO2: 23
CO2: 23
Chloride: 105
Creatinine, Ser: 0.78
GFR calc Af Amer: >60
GFR calc non Af Amer: >60
Glucose, Bld: 114 — ABNORMAL HIGH
Phosphorus: 2.3
Potassium: 4.1
Potassium: 4.6
Sodium: 137

## 2011-09-16 LAB — URINALYSIS, ROUTINE W REFLEX MICROSCOPIC
Bilirubin Urine: NEGATIVE
Ketones, ur: NEGATIVE
Nitrite: NEGATIVE
pH: 6

## 2011-09-16 LAB — DIFFERENTIAL
Basophils Absolute: 0
Basophils Absolute: 0.1
Basophils Relative: 0
Basophils Relative: 0
Eosinophils Absolute: 0.2
Eosinophils Absolute: 0.2
Eosinophils Relative: 2
Lymphocytes Relative: 18
Lymphs Abs: 1.3
Lymphs Abs: 1.8
Monocytes Absolute: 0.8
Monocytes Relative: 10
Monocytes Relative: 10
Neutro Abs: 6.5
Neutrophils Relative %: 70
Neutrophils Relative %: 72

## 2011-09-16 LAB — CLOSTRIDIUM DIFFICILE EIA
C difficile Toxins A+B, EIA: NEGATIVE
C difficile Toxins A+B, EIA: NEGATIVE

## 2011-09-16 LAB — MAGNESIUM: Magnesium: 2.2

## 2011-09-16 LAB — URINE MICROSCOPIC-ADD ON

## 2011-09-16 LAB — SEDIMENTATION RATE: Sed Rate: 96 — ABNORMAL HIGH

## 2011-09-16 LAB — GIARDIA/CRYPTOSPORIDIUM SCREEN(EIA): Giardia Screen - EIA: NEGATIVE

## 2011-09-16 LAB — APTT: aPTT: 41 — ABNORMAL HIGH

## 2011-09-17 LAB — URINALYSIS, ROUTINE W REFLEX MICROSCOPIC
Bilirubin Urine: NEGATIVE
Glucose, UA: NEGATIVE
Hgb urine dipstick: NEGATIVE
Specific Gravity, Urine: 1.017
pH: 5.5

## 2011-09-17 LAB — DIFFERENTIAL
Lymphocytes Relative: 12
Monocytes Absolute: 1.3 — ABNORMAL HIGH
Monocytes Relative: 10
Neutro Abs: 9.7 — ABNORMAL HIGH

## 2011-09-17 LAB — I-STAT 8, (EC8 V) (CONVERTED LAB)
Acid-Base Excess: 3 — ABNORMAL HIGH
Glucose, Bld: 109 — ABNORMAL HIGH
HCT: 42
Hemoglobin: 14.3
Operator id: 151321
Potassium: 4.2
Sodium: 135
TCO2: 29

## 2011-09-17 LAB — COMPREHENSIVE METABOLIC PANEL
AST: 13
Albumin: 3 — ABNORMAL LOW
Alkaline Phosphatase: 60
Chloride: 101
GFR calc Af Amer: >60
Potassium: 4
Sodium: 139
Total Bilirubin: 0.7

## 2011-09-17 LAB — WOUND CULTURE

## 2011-09-17 LAB — URINE MICROSCOPIC-ADD ON

## 2011-09-17 LAB — URINE CULTURE: Colony Count: >=100000

## 2011-09-17 LAB — CBC
Hemoglobin: 13.1
MCHC: 34.3
RBC: 4.28

## 2011-09-17 LAB — CULTURE, BLOOD (ROUTINE X 2): Culture: NO GROWTH

## 2011-09-18 ENCOUNTER — Ambulatory Visit (INDEPENDENT_AMBULATORY_CARE_PROVIDER_SITE_OTHER): Payer: BC Managed Care – PPO | Admitting: General Surgery

## 2011-09-18 ENCOUNTER — Encounter (INDEPENDENT_AMBULATORY_CARE_PROVIDER_SITE_OTHER): Payer: Self-pay | Admitting: General Surgery

## 2011-09-18 VITALS — BP 124/76 | HR 60 | Temp 98.3°F | Resp 12 | Ht 73.0 in | Wt 175.0 lb

## 2011-09-18 DIAGNOSIS — S3130XA Unspecified open wound of scrotum and testes, initial encounter: Secondary | ICD-10-CM

## 2011-09-18 DIAGNOSIS — S31809A Unspecified open wound of unspecified buttock, initial encounter: Secondary | ICD-10-CM

## 2011-09-18 NOTE — Progress Notes (Signed)
Addended by: Latricia Heft on: 09/18/2011 03:22 PM   Modules accepted: Orders

## 2011-09-18 NOTE — Patient Instructions (Addendum)
We will make a referral to the wound center with Dr. Lurene Shadow

## 2011-09-18 NOTE — Progress Notes (Signed)
Subjective:     Patient ID: Shawn [name]., male   DOB: [birth year], 60 y.o.   MRN: [id number]  HPI  Patient presented for followup of wound right buttock and scrotum. He has an SP tube in place which is changed regularly by Dr.Nesi. The small wound at the base of his scrotum has almost closed per the patient's wife. She is to do much dries at the right buttock wound. She feels it is not progress in healing. She is a previous patient of Dr. Garth Schlatter. She asks if he is still working at the wound center. Review of Systems     Objective:   Physical Exam Right buttock wound remains open. It is clean granulation tissue. It is only gotten slightly smaller since his last visit however. There is no doing signs of infection. The wound at the base of his scrotum is only about 1 cm in size. It is almost close. There is no ongoing infection there either. Wet-to-dry dressing was changed to the open buttock wound and dry gauze was placed on the scrotal.    Assessment:     Status post incision drainage complex right buttock and scrotal abscess with delayed wound healing    Plan:     Continue wound care. We will refer to the wound care center for recommendations and treatment. I will plan to see him back in 8 weeks.

## 2011-09-23 LAB — CBC
MCHC: 35.4
Platelets: 266
RBC: 4.95
WBC: 8.1

## 2011-09-23 LAB — DIFFERENTIAL
Basophils Relative: 0
Lymphs Abs: 1.9
Monocytes Relative: 9
Neutro Abs: 5.3
Neutrophils Relative %: 66

## 2011-09-26 ENCOUNTER — Encounter (HOSPITAL_BASED_OUTPATIENT_CLINIC_OR_DEPARTMENT_OTHER): Payer: BC Managed Care – PPO | Attending: Internal Medicine

## 2011-09-26 DIAGNOSIS — I10 Essential (primary) hypertension: Secondary | ICD-10-CM | POA: Insufficient documentation

## 2011-09-26 DIAGNOSIS — S71009A Unspecified open wound, unspecified hip, initial encounter: Secondary | ICD-10-CM | POA: Insufficient documentation

## 2011-09-26 DIAGNOSIS — Z8614 Personal history of Methicillin resistant Staphylococcus aureus infection: Secondary | ICD-10-CM | POA: Insufficient documentation

## 2011-09-26 DIAGNOSIS — X58XXXA Exposure to other specified factors, initial encounter: Secondary | ICD-10-CM | POA: Insufficient documentation

## 2011-09-26 DIAGNOSIS — G8220 Paraplegia, unspecified: Secondary | ICD-10-CM | POA: Insufficient documentation

## 2011-09-26 DIAGNOSIS — E785 Hyperlipidemia, unspecified: Secondary | ICD-10-CM | POA: Insufficient documentation

## 2011-09-26 DIAGNOSIS — S71109A Unspecified open wound, unspecified thigh, initial encounter: Secondary | ICD-10-CM | POA: Insufficient documentation

## 2011-09-26 DIAGNOSIS — IMO0002 Reserved for concepts with insufficient information to code with codable children: Secondary | ICD-10-CM | POA: Insufficient documentation
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
## 2011-10-03 ENCOUNTER — Other Ambulatory Visit (HOSPITAL_BASED_OUTPATIENT_CLINIC_OR_DEPARTMENT_OTHER): Payer: Self-pay | Admitting: Internal Medicine

## 2011-10-03 DIAGNOSIS — M869 Osteomyelitis, unspecified: Secondary | ICD-10-CM

## 2011-10-07 ENCOUNTER — Ambulatory Visit (HOSPITAL_COMMUNITY)
Admission: RE | Admit: 2011-10-07 | Discharge: 2011-10-07 | Disposition: A | Discharge status: Home / Self Care | Payer: BC Managed Care – PPO | Source: Ambulatory Visit | Attending: Internal Medicine | Admitting: Internal Medicine

## 2011-10-07 DIAGNOSIS — L89309 Pressure ulcer of unspecified buttock, unspecified stage: Secondary | ICD-10-CM | POA: Insufficient documentation

## 2011-10-07 DIAGNOSIS — N400 Enlarged prostate without lower urinary tract symptoms: Secondary | ICD-10-CM | POA: Insufficient documentation

## 2011-10-07 DIAGNOSIS — L8994 Pressure ulcer of unspecified site, stage 4: Secondary | ICD-10-CM | POA: Insufficient documentation

## 2011-10-07 DIAGNOSIS — M869 Osteomyelitis, unspecified: Secondary | ICD-10-CM | POA: Insufficient documentation

## 2011-10-07 MED ORDER — IOHEXOL 300 MG/ML  SOLN
80.0000 mL | Freq: Once | INTRAMUSCULAR | Status: AC | PRN
  Administered 2011-10-07: 80 mL via INTRAVENOUS

## 2011-10-09 ENCOUNTER — Other Ambulatory Visit: Payer: Self-pay | Admitting: Internal Medicine

## 2011-10-09 DIAGNOSIS — G894 Chronic pain syndrome: Secondary | ICD-10-CM

## 2011-10-09 MED ORDER — OXYMORPHONE HCL ER 20 MG PO T12A: 1.0000 | EXTENDED_RELEASE_TABLET | Freq: Two times a day (BID) | ORAL | Status: DC

## 2011-10-09 MED ORDER — OXYCODONE-ACETAMINOPHEN 10-325 MG PO TABS: 1.0000 | ORAL_TABLET | ORAL | Status: DC | PRN

## 2011-10-09 NOTE — Progress Notes (Signed)
  Subjective:    Patient ID: Shawn [name]., male    DOB: 1951/02/08, 60 y.o.   MRN: [id number]  HPI    Review of Systems     Objective:   Physical Exam        Assessment & Plan:

## 2011-10-09 NOTE — Progress Notes (Signed)
3 months of opiates given. Long term chronic pain.

## 2011-10-10 ENCOUNTER — Encounter (HOSPITAL_BASED_OUTPATIENT_CLINIC_OR_DEPARTMENT_OTHER): Payer: BC Managed Care – PPO | Attending: Internal Medicine

## 2011-10-10 ENCOUNTER — Ambulatory Visit: Payer: BC Managed Care – PPO | Admitting: Internal Medicine

## 2011-10-10 DIAGNOSIS — IMO0002 Reserved for concepts with insufficient information to code with codable children: Secondary | ICD-10-CM | POA: Insufficient documentation

## 2011-10-10 DIAGNOSIS — S71109A Unspecified open wound, unspecified thigh, initial encounter: Secondary | ICD-10-CM | POA: Insufficient documentation

## 2011-10-10 DIAGNOSIS — Z8614 Personal history of Methicillin resistant Staphylococcus aureus infection: Secondary | ICD-10-CM | POA: Insufficient documentation

## 2011-10-10 DIAGNOSIS — S71009A Unspecified open wound, unspecified hip, initial encounter: Secondary | ICD-10-CM | POA: Insufficient documentation

## 2011-10-10 DIAGNOSIS — E785 Hyperlipidemia, unspecified: Secondary | ICD-10-CM | POA: Insufficient documentation

## 2011-10-10 DIAGNOSIS — I10 Essential (primary) hypertension: Secondary | ICD-10-CM | POA: Insufficient documentation

## 2011-10-10 DIAGNOSIS — G8220 Paraplegia, unspecified: Secondary | ICD-10-CM | POA: Insufficient documentation

## 2011-10-10 DIAGNOSIS — X58XXXA Exposure to other specified factors, initial encounter: Secondary | ICD-10-CM | POA: Insufficient documentation

## 2011-11-06 ENCOUNTER — Encounter (INDEPENDENT_AMBULATORY_CARE_PROVIDER_SITE_OTHER): Payer: BC Managed Care – PPO | Admitting: General Surgery

## 2011-11-18 ENCOUNTER — Ambulatory Visit (INDEPENDENT_AMBULATORY_CARE_PROVIDER_SITE_OTHER): Payer: BC Managed Care – PPO | Admitting: Internal Medicine

## 2011-11-18 ENCOUNTER — Telehealth: Payer: Self-pay | Admitting: *Deleted

## 2011-11-18 ENCOUNTER — Encounter: Payer: Self-pay | Admitting: Internal Medicine

## 2011-11-18 VITALS — BP 109/72 | HR 62 | Temp 98.1°F | Resp 20 | Ht 73.0 in | Wt 175.0 lb

## 2011-11-18 DIAGNOSIS — I10 Essential (primary) hypertension: Secondary | ICD-10-CM

## 2011-11-18 LAB — BASIC METABOLIC PANEL
BUN: 13 mg/dL (ref 6–23)
Chloride: 98 mEq/L (ref 96–112)
Potassium: 3.9 mEq/L (ref 3.5–5.3)
Sodium: 135 mEq/L (ref 135–145)
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
## 2011-11-18 NOTE — Telephone Encounter (Signed)
Pt c/o headaches and feeling bad, weak and tired.  Onset 2 weeks ago. Headaches are on and off. He takes nothing for the pain.  He lays down and feels better.  Will see today

## 2011-11-18 NOTE — Patient Instructions (Signed)
1. Stop the Hydrochlorothiazide for your blood pressure.  2.  Check your blood pressure at home daily and if it starts to come up high let us know.  3.  Follow up in about 2 weeks.

## 2011-11-20 ENCOUNTER — Ambulatory Visit (INDEPENDENT_AMBULATORY_CARE_PROVIDER_SITE_OTHER): Payer: BC Managed Care – PPO | Admitting: General Surgery

## 2011-11-20 ENCOUNTER — Encounter (INDEPENDENT_AMBULATORY_CARE_PROVIDER_SITE_OTHER): Payer: Self-pay | Admitting: General Surgery

## 2011-11-20 VITALS — BP 128/78 | HR 60 | Temp 97.0°F | Resp 16 | Ht 73.5 in | Wt 175.0 lb

## 2011-11-20 DIAGNOSIS — L03319 Cellulitis of trunk, unspecified: Secondary | ICD-10-CM

## 2011-11-20 DIAGNOSIS — L02215 Cutaneous abscess of perineum: Secondary | ICD-10-CM

## 2011-11-20 NOTE — Progress Notes (Signed)
Subjective:     Patient ID: Shawn [name]., male   DOB: [birth year], 60 y.o.   MRN: [id number]  HPI Patient is status post incision and drainage of a perineal abscess. He also had suprapubic catheter placed at that time. There was some involvement of the scrotum. He is undergoing V. A. C. Treatment for initiate wound. This is being cared for by the wound care center. He claims his perineal wound has healed.  Review of Systems     Objective:   Physical Exam  Pulmonary/Chest: Effort normal and breath sounds normal. No respiratory distress.  Abdominal: Soft. There is no tenderness.  Perineal wound has healed. He has a VAC on the right ischial wound. The scrotal wound has also healed. There is no evidence of ongoing infection.     Assessment:    Status post incision and drainage of perineal and scrotal abscess.    Plan:     His perineal and scrotal wounds have healed. He'll continue treatment for his issue on that point care center. See him back as needed.

## 2011-11-24 NOTE — Assessment & Plan Note (Signed)
Lab Results  Component Value Date   NA 135 11/18/2011   K 3.9 11/18/2011   CL 98 11/18/2011   CO2 26 11/18/2011   BUN 13 11/18/2011   CREATININE 0.56 11/18/2011   CREATININE 0.62 07/13/2011    BP Readings from Last 3 Encounters:  11/20/11 128/78  11/18/11 109/72  09/18/11 124/76    Assessment: Hypertension control:  blood pressure is actually low and he is orthostatic.  Progress toward goals:  at goal Barriers to meeting goals:  adverse effects of medications  Plan: Hypertension treatment:  He is mildly orthostatic and he appears mildly dehydrated on exam.  We will stop his HCTZ and have him follow up in 2 weeks to see how his blood pressure is doing.  HE will need repeat orthostatics at that time.

## 2011-11-28 ENCOUNTER — Encounter (HOSPITAL_BASED_OUTPATIENT_CLINIC_OR_DEPARTMENT_OTHER): Payer: BC Managed Care – PPO | Attending: Internal Medicine

## 2011-11-28 DIAGNOSIS — L8990 Pressure ulcer of unspecified site, unspecified stage: Secondary | ICD-10-CM | POA: Insufficient documentation

## 2011-11-28 DIAGNOSIS — L89309 Pressure ulcer of unspecified buttock, unspecified stage: Secondary | ICD-10-CM | POA: Insufficient documentation

## 2011-11-29 ENCOUNTER — Encounter: Payer: Self-pay | Admitting: Internal Medicine

## 2011-11-29 ENCOUNTER — Ambulatory Visit (INDEPENDENT_AMBULATORY_CARE_PROVIDER_SITE_OTHER): Payer: BC Managed Care – PPO | Admitting: Internal Medicine

## 2011-11-29 VITALS — BP 112/78 | HR 71 | Temp 97.7°F | Ht 73.5 in

## 2011-11-29 DIAGNOSIS — Z23 Encounter for immunization: Secondary | ICD-10-CM

## 2011-11-29 DIAGNOSIS — Z299 Encounter for prophylactic measures, unspecified: Secondary | ICD-10-CM

## 2011-11-29 DIAGNOSIS — E538 Deficiency of other specified B group vitamins: Secondary | ICD-10-CM

## 2011-11-29 DIAGNOSIS — I10 Essential (primary) hypertension: Secondary | ICD-10-CM

## 2011-11-29 MED ORDER — CYANOCOBALAMIN 1000 MCG/ML IJ SOLN
1000.0000 ug | Freq: Once | INTRAMUSCULAR | Status: AC
  Administered 2011-11-29: 1000 ug via INTRAMUSCULAR
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
## 2011-11-29 NOTE — Patient Instructions (Signed)
1.  Continue your medications as prescribed.  2.  We will check your B-12 level and your MMA level to see if we need to continue the shots.    3.  Follow up in about 1-2 months to meet your new primary care doctor, or earlier if you need anything.  4.  It is okay to take the HCTZ if you get swelling for a few days.

## 2011-12-12 ENCOUNTER — Encounter (HOSPITAL_BASED_OUTPATIENT_CLINIC_OR_DEPARTMENT_OTHER): Payer: BC Managed Care – PPO | Attending: Internal Medicine

## 2011-12-12 DIAGNOSIS — L89309 Pressure ulcer of unspecified buttock, unspecified stage: Secondary | ICD-10-CM | POA: Insufficient documentation

## 2011-12-12 DIAGNOSIS — E785 Hyperlipidemia, unspecified: Secondary | ICD-10-CM | POA: Insufficient documentation

## 2011-12-12 DIAGNOSIS — I10 Essential (primary) hypertension: Secondary | ICD-10-CM | POA: Insufficient documentation

## 2011-12-12 DIAGNOSIS — G839 Paralytic syndrome, unspecified: Secondary | ICD-10-CM | POA: Insufficient documentation

## 2011-12-12 DIAGNOSIS — L8990 Pressure ulcer of unspecified site, unspecified stage: Secondary | ICD-10-CM | POA: Insufficient documentation

## 2011-12-12 DIAGNOSIS — Z79899 Other long term (current) drug therapy: Secondary | ICD-10-CM | POA: Insufficient documentation

## 2011-12-26 ENCOUNTER — Encounter (HOSPITAL_BASED_OUTPATIENT_CLINIC_OR_DEPARTMENT_OTHER): Payer: BC Managed Care – PPO

## 2012-01-02 ENCOUNTER — Encounter (HOSPITAL_BASED_OUTPATIENT_CLINIC_OR_DEPARTMENT_OTHER): Payer: BC Managed Care – PPO

## 2012-01-07 ENCOUNTER — Other Ambulatory Visit: Payer: Self-pay | Admitting: *Deleted

## 2012-01-07 MED ORDER — OXYCODONE-ACETAMINOPHEN 10-325 MG PO TABS: 1.0000 | ORAL_TABLET | ORAL | Status: DC | PRN

## 2012-01-07 NOTE — Telephone Encounter (Signed)
Done

## 2012-01-07 NOTE — Telephone Encounter (Signed)
Would like to get 3 prescriptions at a time.  Wife can pick up on 01/10/2012.  Will; not fill until 01/13/2012.  Angelina Ok, RN 01/07/2012 3:18 PM

## 2012-01-11 ENCOUNTER — Other Ambulatory Visit: Payer: Self-pay | Admitting: Internal Medicine

## 2012-01-15 ENCOUNTER — Other Ambulatory Visit: Payer: Self-pay | Admitting: *Deleted

## 2012-01-15 MED ORDER — OXYMORPHONE HCL ER 20 MG PO T12A: 1.0000 | EXTENDED_RELEASE_TABLET | Freq: Two times a day (BID) | ORAL | Status: DC

## 2012-01-15 NOTE — Telephone Encounter (Signed)
Thank you for filling this prescription.

## 2012-01-22 DIAGNOSIS — N498 Inflammatory disorders of other specified male genital organs: Secondary | ICD-10-CM | POA: Diagnosis not present

## 2012-01-23 ENCOUNTER — Encounter (HOSPITAL_BASED_OUTPATIENT_CLINIC_OR_DEPARTMENT_OTHER): Payer: BC Managed Care – PPO | Attending: Internal Medicine

## 2012-01-23 DIAGNOSIS — L89309 Pressure ulcer of unspecified buttock, unspecified stage: Secondary | ICD-10-CM | POA: Insufficient documentation

## 2012-01-23 DIAGNOSIS — L8994 Pressure ulcer of unspecified site, stage 4: Secondary | ICD-10-CM | POA: Insufficient documentation

## 2012-01-31 ENCOUNTER — Ambulatory Visit (INDEPENDENT_AMBULATORY_CARE_PROVIDER_SITE_OTHER): Payer: BC Managed Care – PPO | Admitting: Internal Medicine

## 2012-01-31 ENCOUNTER — Encounter: Payer: Self-pay | Admitting: Internal Medicine

## 2012-01-31 DIAGNOSIS — E785 Hyperlipidemia, unspecified: Secondary | ICD-10-CM | POA: Diagnosis not present

## 2012-01-31 DIAGNOSIS — G894 Chronic pain syndrome: Secondary | ICD-10-CM

## 2012-01-31 DIAGNOSIS — I10 Essential (primary) hypertension: Secondary | ICD-10-CM | POA: Diagnosis not present

## 2012-01-31 LAB — LIPID PANEL
Cholesterol: 117 mg/dL (ref 0–200)
LDL Cholesterol: 67 mg/dL (ref 0–99)
Total CHOL/HDL Ratio: 3.4 Ratio
Triglycerides: 80 mg/dL (ref <150)
VLDL: 16 mg/dL (ref 0–40)

## 2012-01-31 MED ORDER — LISINOPRIL 10 MG PO TABS: 10.0000 mg | ORAL_TABLET | Freq: Every day | ORAL | Status: DC

## 2012-01-31 MED ORDER — OXYMORPHONE HCL ER 20 MG PO T12A: 1.0000 | EXTENDED_RELEASE_TABLET | Freq: Two times a day (BID) | ORAL | Status: DC

## 2012-01-31 MED ORDER — GABAPENTIN 300 MG PO CAPS: 300.0000 mg | ORAL_CAPSULE | Freq: Three times a day (TID) | ORAL | Status: DC

## 2012-01-31 MED ORDER — OXYCODONE-ACETAMINOPHEN 10-325 MG PO TABS: 1.0000 | ORAL_TABLET | ORAL | Status: DC | PRN
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
## 2012-01-31 NOTE — Patient Instructions (Signed)
1.  Start Lisinopril 10 mg tablets.  Take 1 tablet daily for your blood pressure  2.  Consider the shingles shot.  You can get it at the health department or [facility].  I would call your insurance to see how much it would cost.  3.  Follow up in about 2-3 weeks to recheck your blood pressure.  4.  Stop in the lab and I will call if we need to follow up on anything.

## 2012-02-01 NOTE — Assessment & Plan Note (Signed)
Lab Results  Component Value Date   NA 135 11/18/2011   K 3.9 11/18/2011   CL 98 11/18/2011   CO2 26 11/18/2011   BUN 13 11/18/2011   CREATININE 0.56 11/18/2011   CREATININE 0.62 07/13/2011    BP Readings from Last 3 Encounters:  11/29/11 112/78  11/20/11 128/78    Assessment: Hypertension control:  controlled  Progress toward goals:  at goal Barriers to meeting goals:  no barriers identified  Plan: Hypertension treatment:  continue current medications

## 2012-02-01 NOTE — Assessment & Plan Note (Signed)
Lab Results  Component Value Date   VITAMINB12 278 12/13/2010   His last B12 was normal so we will continue to monitor this and supplement him as needed.

## 2012-02-13 ENCOUNTER — Encounter (HOSPITAL_BASED_OUTPATIENT_CLINIC_OR_DEPARTMENT_OTHER): Payer: BC Managed Care – PPO | Attending: Internal Medicine

## 2012-02-13 DIAGNOSIS — L8990 Pressure ulcer of unspecified site, unspecified stage: Secondary | ICD-10-CM | POA: Insufficient documentation

## 2012-02-13 DIAGNOSIS — L89309 Pressure ulcer of unspecified buttock, unspecified stage: Secondary | ICD-10-CM | POA: Insufficient documentation

## 2012-02-14 ENCOUNTER — Encounter: Payer: Self-pay | Admitting: Internal Medicine

## 2012-02-14 ENCOUNTER — Ambulatory Visit (INDEPENDENT_AMBULATORY_CARE_PROVIDER_SITE_OTHER): Payer: BC Managed Care – PPO | Admitting: Internal Medicine

## 2012-02-14 DIAGNOSIS — I10 Essential (primary) hypertension: Secondary | ICD-10-CM

## 2012-02-14 DIAGNOSIS — Z0000 Encounter for general adult medical examination without abnormal findings: Secondary | ICD-10-CM

## 2012-02-14 LAB — BASIC METABOLIC PANEL
BUN: 7 mg/dL (ref 6–23)
Calcium: 8.6 mg/dL (ref 8.4–10.5)
Chloride: 104 mEq/L (ref 96–112)
Creat: 0.56 mg/dL (ref 0.50–1.35)

## 2012-02-14 MED ORDER — LISINOPRIL 20 MG PO TABS: 20.0000 mg | ORAL_TABLET | Freq: Every day | ORAL | Status: DC

## 2012-02-14 MED ORDER — ZOSTER VACCINE LIVE 19400 UNT/0.65ML ~~LOC~~ SOLR: 0.6500 mL | Freq: Once | SUBCUTANEOUS | Status: AC

## 2012-02-14 NOTE — Progress Notes (Signed)
Subjective:     Patient ID: Shawn [name]., male   DOB: 11-Apr-1951, 61 y.o.   MRN: [id number]  HPI  Patient is a pleasant 61 year old male here for followup after he was started on lisinopril for blood pressure control, patient is tolerating the medication well, also would like a prescription for Zostavax. Denies any other complaints.   Review of Systems  All other systems reviewed and are negative.       Objective:   Physical Exam  Constitutional: He is oriented to person, place, and time. He appears well-developed and well-nourished.  HENT:  Head: Normocephalic and atraumatic.  Eyes: Pupils are equal, round, and reactive to light.  Neck: Normal range of motion. No JVD present. No thyromegaly present.  Cardiovascular: Normal rate, regular rhythm and normal heart sounds.   Pulmonary/Chest: Effort normal and breath sounds normal. He has no wheezes. He has no rales.  Abdominal: Soft. Bowel sounds are normal. There is no tenderness. There is no rebound.  Musculoskeletal: Normal range of motion. He exhibits no edema.  Neurological: He is alert and oriented to person, place, and time.       Paraplegic, wheelchair dependent  Skin: Skin is warm and dry.    Patient Active Problem List  Diagnoses  . HYPERLIPIDEMIA  . ANEMIA  . CHRONIC PAIN SYNDROME  . PARAPLEGIA  . HYPERTENSION, BENIGN ESSENTIAL  . ALLERGIC RHINITIS  . GERD  . ARTHRITIS  . CERVICAL SPASM  . VITAMIN B12 DEFICIENCY  . Perineal abscess     Current Outpatient Prescriptions on File Prior to Visit  Medication Sig Dispense Refill  . diclofenac sodium (VOLTAREN) 1 % GEL Apply 1 application topically every 6 (six) hours as needed.  100 g  11  . gabapentin (NEURONTIN) 300 MG capsule Take 1 capsule (300 mg total) by mouth 3 (three) times daily.  90 capsule  2  . lansoprazole (PREVACID) 30 MG capsule TAKE 1 TABLET BY MOUTH DAILY.  90 capsule  4  . lisinopril (PRINIVIL,ZESTRIL) 10 MG tablet Take 1 tablet (10 mg total) by  mouth daily.  30 tablet  6  . mupirocin (BACTROBAN) 2 % cream Apply to affected area 3 times daily  30 g  0  . oxyCODONE-acetaminophen (PERCOCET) 10-325 MG per tablet Take 1 tablet by mouth 4 (four) times daily.  120 tablet  0  . oxyCODONE-acetaminophen (PERCOCET) 10-325 MG per tablet Take 1 tablet by mouth every 4 (four) hours as needed for pain.  120 tablet  0  . Oxymorphone HCl, Crush Resist, (OPANA ER, CRUSH RESISTANT,) 20 MG TB12 Take 1 tablet by mouth every 12 (twelve) hours.  60 tablet  0  . zolpidem (AMBIEN CR) 12.5 MG CR tablet Take 1 tablet (12.5 mg total) by mouth at bedtime.  30 tablet  5    No Known Allergies

## 2012-02-14 NOTE — Patient Instructions (Addendum)
Please increase your blood pressure medication lisinopril to 20 mg daily

## 2012-02-14 NOTE — Assessment & Plan Note (Signed)
Patient was started on lisinopril 10 mg last visit, blood pressure still above goal, will increase lisinopril to 20 mg and recheck at next followup, will also check a basic metabolic panel today to ensure that lisinopril has not caused any kidney damage

## 2012-02-20 ENCOUNTER — Encounter (HOSPITAL_BASED_OUTPATIENT_CLINIC_OR_DEPARTMENT_OTHER): Payer: BC Managed Care – PPO

## 2012-02-27 NOTE — Progress Notes (Signed)
[facility]  NAME:  ANDREU, DRUDGE NO.:  [id number]  MEDICAL RECORD NO.:  [id number]      DATE OF BIRTH:  Jan 06, 1951  PHYSICIAN:  Wayland Denis, DO       VISIT DATE:  02/26/2012                                  OFFICE VISIT   Shawn [name] is a 61 year old gentleman with a right buttock pressure ulcer. He has been using wet-to-dry dressing changes without any significant improvement.  He has had this for about a year.  He has had it debrided in the past.  About 3 years ago, he had bilateral flaps from Dr. Noelle Penner and has one in this area.  The actual wound is deep and significant in size; however, there is no fibrous tissue, sign of infection, drainage, or nonviable tissue, it is nice and clean and granulating.  It does abut the previous flap.  PHYSICAL EXAMINATION:  He is alert, oriented and cooperative, not in any acute distress.  He is very pleasant.  He seems to be a good historian and have very good support at home.  His pupils are equal.  Extraocular muscles are intact.  No cervical lymphadenopathy.  His breathing is unlabored and his heart is regular.  His abdomen is soft.  The wound is noted above and described.  We would like to check a prealbumin to see what his nutritional level is.  If it is adequate, then we will move towards the possibility of advancing the flap.  If not, we will work on the nutrition.  In the meantime, we stressed offloading.  He does have an air mattress bed, protein, multivitamins, and continue with normal saline wet-to-dry and we will see him back in a week.     Wayland Denis, DO     CS/MEDQ  D:  02/26/2012  T:  02/27/2012  Job:  [id number]

## 2012-03-25 ENCOUNTER — Encounter (HOSPITAL_BASED_OUTPATIENT_CLINIC_OR_DEPARTMENT_OTHER): Payer: BC Managed Care – PPO | Attending: Plastic Surgery

## 2012-03-25 DIAGNOSIS — L98499 Non-pressure chronic ulcer of skin of other sites with unspecified severity: Secondary | ICD-10-CM | POA: Insufficient documentation

## 2012-03-25 NOTE — Progress Notes (Signed)
[facility]  NAME:  ORSON, RHO NO.:  [id number]  MEDICAL RECORD NO.:  [id number]      DATE OF BIRTH:  02-16-1951  PHYSICIAN:  Wayland Denis, DO       VISIT DATE:  03/25/2012                                  OFFICE VISIT   The patient is a 61 year old gentleman who is here for followup on his right ischial ulcer.  He has been using normal saline wet-to-dry with gauze and tape.  There is not much change over the last few weeks, but it is still clean and granulating.  His prealbumin came back at 21.3, so this is encouraging.  There has been no change in his medications or social history.  On exam, he is alert, oriented, cooperative, not in any acute distress. He is very pleasant.  Pupils are equal.  Extraocular muscles are intact. No cervical lymphadenopathy.  Breathing is unlabored.  The wound is as described above.  At this point, I would recommend continue with the wet- to-dry dressing changes and we will see if a flap is an option with Dr. Noland Fordyce and we will follow up with him.  In the meantime continue with maximizing protein and staying off of the wound to decrease pressure.     Wayland Denis, DO     CS/MEDQ  D:  03/25/2012  T:  03/25/2012  Job:  [id number]

## 2012-04-07 DIAGNOSIS — L89899 Pressure ulcer of other site, unspecified stage: Secondary | ICD-10-CM | POA: Diagnosis not present

## 2012-04-07 DIAGNOSIS — L8990 Pressure ulcer of unspecified site, unspecified stage: Secondary | ICD-10-CM | POA: Diagnosis not present

## 2012-04-11 NOTE — Assessment & Plan Note (Signed)
Lab Results  Component Value Date   NA 134* 02/14/2012   K 4.0 02/14/2012   CL 104 02/14/2012   CO2 21 02/14/2012   BUN 7 02/14/2012   CREATININE 0.56 02/14/2012   CREATININE 0.62 07/13/2011    BP Readings from Last 3 Encounters:  01/31/12 152/86  11/29/11 112/78    Assessment: Hypertension control:  mildly elevated  Progress toward goals:  deteriorated Barriers to meeting goals:  nonadherence to medications  Plan: Hypertension treatment:  We will restart lisinopril 10 mg daily and have him come back in about 2-3 weeks to recheck his blood pressure and titrate as needed.

## 2012-04-11 NOTE — Assessment & Plan Note (Signed)
He is on long term pain medications that are related to his paraplegia.  We will get them both the same schedule so he only has to come once per month.

## 2012-04-11 NOTE — Assessment & Plan Note (Signed)
Lab Results  Component Value Date   CHOL 117 01/31/2012   CHOL 117 12/13/2010   CHOL 127 01/22/2010   Lab Results  Component Value Date   HDL 34* 01/31/2012   HDL 27* 12/13/2010   HDL 35* 01/22/2010   Lab Results  Component Value Date   LDLCALC 67 01/31/2012   LDLCALC 73 12/13/2010   LDLCALC 76 01/22/2010   Lab Results  Component Value Date   TRIG 80 01/31/2012   TRIG 86 12/13/2010   TRIG 81 01/22/2010   Lab Results  Component Value Date   CHOLHDL 3.4 01/31/2012   CHOLHDL 4.3 Ratio 12/13/2010   CHOLHDL 3.6 Ratio 01/22/2010   No results found for this basename: LDLDIRECT   LDL is excellent today and has been for sometime.  We will continue to monitor on a yearly basis.

## 2012-04-14 DIAGNOSIS — K219 Gastro-esophageal reflux disease without esophagitis: Secondary | ICD-10-CM | POA: Diagnosis present

## 2012-04-14 DIAGNOSIS — D62 Acute posthemorrhagic anemia: Secondary | ICD-10-CM | POA: Diagnosis not present

## 2012-04-14 DIAGNOSIS — L89309 Pressure ulcer of unspecified buttock, unspecified stage: Secondary | ICD-10-CM | POA: Diagnosis present

## 2012-04-14 DIAGNOSIS — F17200 Nicotine dependence, unspecified, uncomplicated: Secondary | ICD-10-CM | POA: Diagnosis present

## 2012-04-14 DIAGNOSIS — G8220 Paraplegia, unspecified: Secondary | ICD-10-CM | POA: Diagnosis present

## 2012-04-14 DIAGNOSIS — L8994 Pressure ulcer of unspecified site, stage 4: Secondary | ICD-10-CM | POA: Diagnosis present

## 2012-04-14 DIAGNOSIS — F411 Generalized anxiety disorder: Secondary | ICD-10-CM | POA: Diagnosis present

## 2012-04-14 DIAGNOSIS — I10 Essential (primary) hypertension: Secondary | ICD-10-CM | POA: Insufficient documentation

## 2012-04-15 ENCOUNTER — Encounter (HOSPITAL_BASED_OUTPATIENT_CLINIC_OR_DEPARTMENT_OTHER): Payer: BC Managed Care – PPO | Attending: Plastic Surgery

## 2012-04-15 DIAGNOSIS — L98499 Non-pressure chronic ulcer of skin of other sites with unspecified severity: Secondary | ICD-10-CM | POA: Insufficient documentation

## 2012-04-16 NOTE — Progress Notes (Signed)
[facility]  NAME:  Shawn, [name] NO.:  [id number]  MEDICAL RECORD NO.:  [id number]      DATE OF BIRTH:  1951-01-28  PHYSICIAN:  Wayland Denis, DO       VISIT DATE:  04/15/2012                                  OFFICE VISIT   Shawn [name] is a 61 year old gentleman who is here for followup on his right ischial ulcer.  He is currently doing wet-to-dry dressing changes. The area does look clean.  There is no purulence and no fibrous tissue and no sign of infection.  He is likely going to undergo surgery with Dr. Loraine Leriche in the next month, and they will work with him on postoperative care.  There has been no change in his medications or social history.  PHYSICAL EXAMINATION:  GENERAL:  He is alert, oriented, cooperative, not in any acute distress.  He is pleasant. HEENT:  Pupils are equal.  Extraocular muscles are intact. NECK:  No cervical lymphadenopathy. CHEST:  Breathing is unlabored. HEART:  Regular. ABDOMEN:  Soft.  The wound is clean, and we will continue with wet-to-dry dressings and have him follow up depending on his surgical intervention.     Wayland Denis, DO     CS/MEDQ  D:  04/15/2012  T:  04/16/2012  Job:  [id number]

## 2012-04-20 DIAGNOSIS — L89309 Pressure ulcer of unspecified buttock, unspecified stage: Secondary | ICD-10-CM | POA: Insufficient documentation

## 2012-04-21 DIAGNOSIS — D62 Acute posthemorrhagic anemia: Secondary | ICD-10-CM | POA: Insufficient documentation

## 2012-04-22 ENCOUNTER — Encounter (HOSPITAL_BASED_OUTPATIENT_CLINIC_OR_DEPARTMENT_OTHER): Payer: BC Managed Care – PPO

## 2012-05-14 ENCOUNTER — Other Ambulatory Visit: Payer: Self-pay | Admitting: *Deleted

## 2012-05-14 DIAGNOSIS — G894 Chronic pain syndrome: Secondary | ICD-10-CM

## 2012-05-14 MED ORDER — OXYCODONE-ACETAMINOPHEN 10-325 MG PO TABS: 1.0000 | ORAL_TABLET | ORAL | Status: DC | PRN

## 2012-05-14 NOTE — Telephone Encounter (Signed)
Last refill per [facility] 5/10    Oxycodone 5 mg # 80    Pt would like to get Rx tomorrow, post dated. Pharmacy mentioned pt did receive Oxycodone 5 mg # 80 on 5/20 from Dr Jonita Albee

## 2012-05-14 NOTE — Telephone Encounter (Signed)
Note 5/4 Dr Tonny Branch indicated that he was having flap procedure to cover wound on backside so the extra Rx was likely peri-operative. Therefore will not include this as a red flag behavior but pls ask pt to inform us in future so that he doesn't jeopardize his opioids.

## 2012-05-14 NOTE — Telephone Encounter (Signed)
Pt informed Rx is ready 

## 2012-05-25 ENCOUNTER — Other Ambulatory Visit: Payer: Self-pay | Admitting: *Deleted

## 2012-05-25 MED ORDER — ZOLPIDEM TARTRATE ER 12.5 MG PO TBCR: 12.5000 mg | EXTENDED_RELEASE_TABLET | Freq: Every day | ORAL | Status: DC

## 2012-05-25 NOTE — Telephone Encounter (Signed)
Rx called in to pharmacy. 

## 2012-05-26 DIAGNOSIS — S71009A Unspecified open wound, unspecified hip, initial encounter: Secondary | ICD-10-CM | POA: Diagnosis not present

## 2012-06-02 DIAGNOSIS — D649 Anemia, unspecified: Secondary | ICD-10-CM | POA: Diagnosis not present

## 2012-06-08 DIAGNOSIS — K219 Gastro-esophageal reflux disease without esophagitis: Secondary | ICD-10-CM | POA: Diagnosis present

## 2012-06-08 DIAGNOSIS — G8220 Paraplegia, unspecified: Secondary | ICD-10-CM | POA: Diagnosis present

## 2012-06-08 DIAGNOSIS — Z8781 Personal history of (healed) traumatic fracture: Secondary | ICD-10-CM | POA: Diagnosis not present

## 2012-06-08 DIAGNOSIS — IMO0002 Reserved for concepts with insufficient information to code with codable children: Secondary | ICD-10-CM | POA: Diagnosis not present

## 2012-06-08 DIAGNOSIS — L8990 Pressure ulcer of unspecified site, unspecified stage: Secondary | ICD-10-CM | POA: Diagnosis present

## 2012-06-08 DIAGNOSIS — Z993 Dependence on wheelchair: Secondary | ICD-10-CM | POA: Diagnosis not present

## 2012-06-08 DIAGNOSIS — L89309 Pressure ulcer of unspecified buttock, unspecified stage: Secondary | ICD-10-CM | POA: Diagnosis present

## 2012-06-08 DIAGNOSIS — D62 Acute posthemorrhagic anemia: Secondary | ICD-10-CM | POA: Diagnosis present

## 2012-06-08 DIAGNOSIS — M25569 Pain in unspecified knee: Secondary | ICD-10-CM | POA: Diagnosis not present

## 2012-06-08 DIAGNOSIS — I10 Essential (primary) hypertension: Secondary | ICD-10-CM | POA: Diagnosis present

## 2012-06-08 DIAGNOSIS — F17200 Nicotine dependence, unspecified, uncomplicated: Secondary | ICD-10-CM | POA: Diagnosis present

## 2012-06-16 ENCOUNTER — Other Ambulatory Visit: Payer: Self-pay | Admitting: *Deleted

## 2012-06-16 DIAGNOSIS — G894 Chronic pain syndrome: Secondary | ICD-10-CM

## 2012-06-16 MED ORDER — OXYCODONE-ACETAMINOPHEN 10-325 MG PO TABS: 1.0000 | ORAL_TABLET | ORAL | Status: DC | PRN

## 2012-07-14 DIAGNOSIS — L89319 Pressure ulcer of right buttock, unspecified stage: Secondary | ICD-10-CM | POA: Insufficient documentation

## 2012-07-15 ENCOUNTER — Other Ambulatory Visit: Payer: Self-pay | Admitting: *Deleted

## 2012-07-15 DIAGNOSIS — G894 Chronic pain syndrome: Secondary | ICD-10-CM

## 2012-07-15 NOTE — Telephone Encounter (Signed)
Last filled 7/9

## 2012-07-16 MED ORDER — OXYCODONE-ACETAMINOPHEN 10-325 MG PO TABS: 1.0000 | ORAL_TABLET | ORAL | Status: DC | PRN

## 2012-08-11 DIAGNOSIS — L89309 Pressure ulcer of unspecified buttock, unspecified stage: Secondary | ICD-10-CM | POA: Diagnosis not present

## 2012-08-13 ENCOUNTER — Emergency Department (HOSPITAL_COMMUNITY)
Admission: EM | Admit: 2012-08-13 | Discharge: 2012-08-13 | Disposition: A | Discharge status: Home / Self Care | Payer: BC Managed Care – PPO | Attending: Emergency Medicine | Admitting: Emergency Medicine

## 2012-08-13 ENCOUNTER — Encounter (HOSPITAL_COMMUNITY): Payer: Self-pay | Admitting: *Deleted

## 2012-08-13 DIAGNOSIS — E785 Hyperlipidemia, unspecified: Secondary | ICD-10-CM | POA: Insufficient documentation

## 2012-08-13 DIAGNOSIS — N319 Neuromuscular dysfunction of bladder, unspecified: Secondary | ICD-10-CM | POA: Insufficient documentation

## 2012-08-13 DIAGNOSIS — Z79899 Other long term (current) drug therapy: Secondary | ICD-10-CM | POA: Insufficient documentation

## 2012-08-13 DIAGNOSIS — T83010A Breakdown (mechanical) of cystostomy catheter, initial encounter: Secondary | ICD-10-CM

## 2012-08-13 DIAGNOSIS — G8220 Paraplegia, unspecified: Secondary | ICD-10-CM | POA: Insufficient documentation

## 2012-08-13 DIAGNOSIS — Z87440 Personal history of urinary (tract) infections: Secondary | ICD-10-CM | POA: Insufficient documentation

## 2012-08-13 DIAGNOSIS — I10 Essential (primary) hypertension: Secondary | ICD-10-CM | POA: Insufficient documentation

## 2012-08-13 DIAGNOSIS — Z466 Encounter for fitting and adjustment of urinary device: Secondary | ICD-10-CM | POA: Diagnosis not present

## 2012-08-13 DIAGNOSIS — K219 Gastro-esophageal reflux disease without esophagitis: Secondary | ICD-10-CM | POA: Insufficient documentation

## 2012-08-13 DIAGNOSIS — Z435 Encounter for attention to cystostomy: Secondary | ICD-10-CM | POA: Insufficient documentation

## 2012-08-13 DIAGNOSIS — F17200 Nicotine dependence, unspecified, uncomplicated: Secondary | ICD-10-CM | POA: Insufficient documentation

## 2012-08-13 NOTE — ED Notes (Signed)
Per EMS, pt from home where home health nurse called them when she was unable to insert suprapubic catheter. Pt also reports intermittent right leg pain.
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
## 2012-08-14 NOTE — ED Provider Notes (Signed)
Medical screening examination/treatment/procedure(s) were conducted as a shared visit with non-physician practitioner(s) and myself.  I personally evaluated the patient during the encounter  Patient treated at home by a nurse, with attempt to replace the suprapubic catheter, but was unsuccessful. Patient is alert, without complaints. I attempted to place a suprapubic catheter using a Foley without success. I contacted Dr. Brunilda Payor, who came to the ED, saw the patient and placed a suprapubic catheter  Flint Melter, MD 08/14/12 2345

## 2012-09-09 ENCOUNTER — Other Ambulatory Visit: Payer: Self-pay | Admitting: Internal Medicine

## 2012-09-12 ENCOUNTER — Other Ambulatory Visit: Payer: Self-pay | Admitting: Internal Medicine

## 2012-09-29 ENCOUNTER — Encounter: Payer: Self-pay | Admitting: Internal Medicine

## 2012-09-29 ENCOUNTER — Ambulatory Visit (INDEPENDENT_AMBULATORY_CARE_PROVIDER_SITE_OTHER): Payer: BC Managed Care – PPO | Admitting: Internal Medicine

## 2012-09-29 VITALS — BP 155/93 | HR 60 | Temp 98.1°F

## 2012-09-29 DIAGNOSIS — I10 Essential (primary) hypertension: Secondary | ICD-10-CM

## 2012-09-29 DIAGNOSIS — G894 Chronic pain syndrome: Secondary | ICD-10-CM | POA: Diagnosis not present

## 2012-09-29 DIAGNOSIS — Z0000 Encounter for general adult medical examination without abnormal findings: Secondary | ICD-10-CM

## 2012-09-29 MED ORDER — OXYCODONE HCL 15 MG PO TABS: 15.0000 mg | ORAL_TABLET | ORAL | Status: DC | PRN

## 2012-09-29 MED ORDER — ZOSTER VACCINE LIVE 19400 UNT/0.65ML ~~LOC~~ SOLR: 0.6500 mL | Freq: Once | SUBCUTANEOUS | Status: DC

## 2012-09-29 NOTE — Assessment & Plan Note (Signed)
The patient has a history of chronic pain, secondary to paraplegia.  He has not been taking his long-acting opiate and asks for an increase in short-acting opiate.  We discuss that the patient may benefit from long-acting opiates given his long-term pain, but patient is resistant to this option.  At this time, I think it is reasonable to discontinue Opana ER (which the patient wasn't taking anyway), and increase the dose of short-acting oxycodone -Change percocet 10-325 to Oxycodone IR 15 mg q4hrs prn -discontinue Opana ER -new pain contract signed today

## 2012-09-29 NOTE — Assessment & Plan Note (Signed)
-  prescription for zostavax given today (patient unable to fill prescription given at a prior visit)

## 2012-09-29 NOTE — Assessment & Plan Note (Signed)
The patient's BP is elevated, but patient has not been taking HCTZ consistently due to a misunderstanding of how to use this medication. -continue lisinopril 20 -take HCTZ every day -recheck BP at next visit, consider increasing lisinopril if BP still elevated

## 2012-09-29 NOTE — Patient Instructions (Signed)
Your blood pressure is elevated today. -starting taking Hydrochlorothiazide, 1 tablet every day.  For your chronic pain, we have resigned a pain contract today for Oxycodone 15 mg.  Please return for a blood pressure check in 3-4 months
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
## 2012-11-10 DIAGNOSIS — L8990 Pressure ulcer of unspecified site, unspecified stage: Secondary | ICD-10-CM | POA: Diagnosis not present

## 2012-11-10 DIAGNOSIS — L89309 Pressure ulcer of unspecified buttock, unspecified stage: Secondary | ICD-10-CM | POA: Diagnosis not present

## 2012-11-17 ENCOUNTER — Other Ambulatory Visit: Payer: Self-pay | Admitting: *Deleted

## 2012-11-18 MED ORDER — HYDROCHLOROTHIAZIDE 25 MG PO TABS: 25.0000 mg | ORAL_TABLET | Freq: Every day | ORAL | Status: DC

## 2012-12-16 ENCOUNTER — Ambulatory Visit (INDEPENDENT_AMBULATORY_CARE_PROVIDER_SITE_OTHER): Payer: BC Managed Care – PPO | Admitting: Internal Medicine

## 2012-12-16 ENCOUNTER — Encounter: Payer: Self-pay | Admitting: Internal Medicine

## 2012-12-16 ENCOUNTER — Encounter: Payer: BC Managed Care – PPO | Admitting: Internal Medicine

## 2012-12-16 VITALS — BP 132/84 | HR 87 | Temp 97.1°F

## 2012-12-16 DIAGNOSIS — T391X1A Poisoning by 4-Aminophenol derivatives, accidental (unintentional), initial encounter: Secondary | ICD-10-CM | POA: Diagnosis not present

## 2012-12-16 DIAGNOSIS — I10 Essential (primary) hypertension: Secondary | ICD-10-CM

## 2012-12-16 DIAGNOSIS — G894 Chronic pain syndrome: Secondary | ICD-10-CM

## 2012-12-16 LAB — COMPLETE METABOLIC PANEL WITH GFR
AST: 23 U/L (ref 0–37)
BUN: 8 mg/dL (ref 6–23)
CO2: 24 mEq/L (ref 19–32)
Calcium: 9.3 mg/dL (ref 8.4–10.5)
Chloride: 89 mEq/L — ABNORMAL LOW (ref 96–112)
Creat: 0.58 mg/dL (ref 0.50–1.35)
GFR, Est African American: >89 mL/min

## 2012-12-16 LAB — HEPATIC FUNCTION PANEL
ALT: 33 U/L (ref 0–53)
AST: 23 U/L (ref 0–37)
Albumin: 3.6 g/dL (ref 3.5–5.2)
Bilirubin, Direct: 0.2 mg/dL (ref 0.0–0.3)

## 2012-12-16 LAB — PROTIME-INR
INR: 1.03 (ref <1.50)
Prothrombin Time: 13.4 seconds (ref 11.6–15.2)

## 2012-12-16 MED ORDER — OXYCODONE HCL 15 MG PO TABS: 15.0000 mg | ORAL_TABLET | ORAL | Status: DC | PRN

## 2012-12-16 NOTE — Patient Instructions (Addendum)
Please stop taking tylenol  Please continue with your other medications as usual  I will contact you with you results from the of your blood and send to the emergency room if needed. You will need to come back for a blood check in 2 weeks.    Acetaminophen Overdose You have taken an overdose of acetaminophen. This may be intentional or accidental. This can cause serious liver damage and death. Hepatitis, frequent alcohol use, or using large amounts of acetaminophen on a regular basis can put you at higher risk for serious liver damage and death. The danger of acetaminophen overdose is that while you feel well, permanent damage may be done to your liver. There are 3 phases of acetaminophen overdose.   At the start you may develop a painful stomach, nausea, vomiting, loss of appetite, and sweats.  Following this you seem to feel better.  The last phase can occur up to 3 to 5 days after the overdose. These symptoms of phase three include:  Tiredness.  Nausea.  Abdominal pain.  Confusion.  Evidence of liver damage. TREATMENT  Treatment is based on the amount of acetaminophen in the body. Treatment of an overdose may include:   Emptying the stomach.  Giving charcoal by mouth.  Giving an antidote. HOME CARE INSTRUCTIONS   Have follow-up liver enzyme levels as recommended by your caregiver.  Take only medications as prescribed. Do not use medications until your caregiver says it is safe. Acetaminophen is often included in combination medicines used for pain.  Drink plenty of fluids. Do not use alcohol. Avoid a high protein diet or as directed. SEEK IMMEDIATE MEDICAL CARE IF:   Your condition gets worse.  You develop abdominal pain.  You vomit repeatedly.  You become confused or less responsive.  You seem to be getting worse in any way. MAKE SURE YOU:   Understand these instructions.  Will watch your condition.  Will get help right away if you are not doing well or  get worse. Document Released: 11/25/2005 Document Revised: 02/17/2012 Document Reviewed: 04/20/2009 [facility] Patient Information 2013 [place], [place].

## 2012-12-17 ENCOUNTER — Telehealth: Payer: Self-pay | Admitting: Internal Medicine

## 2012-12-17 NOTE — Telephone Encounter (Signed)
I talked with Shawn [name] and informed her about the results from the Acetaminophen level, and liver function which came back normal. Shawn [name] is felling better today. I emphasized to her to keep tylenol use to less that 6 tables (3g) in a day.

## 2012-12-20 NOTE — Assessment & Plan Note (Signed)
Patient reports, that he has been taking 4 g daily of acetaminophen over the last 3 months. He misunderstood the instructions for using this medication. Presentation is consistent with mild form of Tylenol toxicity. Stat lab tests reveal less than 15 of serum acetaminophen, with a normal liver function tests are normal INR, PTT. Plan -The patient and his wife, have been meticulously instructed to use not more than 6 tablets of the 500 mg of Tylenol in a 24-hour period. -Patient has been instructed that if symptoms worsen to return to the ED. -Patient verbalizes understanding of these instructions.

## 2012-12-20 NOTE — Assessment & Plan Note (Signed)
Mr. Miralles reports that he gets relief from his current dose of oxycodone 15 mg twice daily. Plan -Have refilled another 120 tablets of oxycodone. -Patient will also take 1 mg every 8 hours of acetaminophen, when necessary for breakthrough pains. -He will followup with his PCP.

## 2012-12-20 NOTE — Progress Notes (Signed)
Patient ID: Shawn [name]., male   DOB: September 02, 1951, 62 y.o.   MRN: [id number] HPI The patient is a 62 y.o. yo male with a history of paraplegia, chronic pain syndrome, HTN, HL, presenting with his wife, complaining of symptoms of fatigue, on and off confusion, and vomiting over the last several weeks. He denies abdominal pain, or diarrhea. Since he left the clinic 3 months ago, he's been taking 4 grams of acetaminophen daily on schedule 8 hourly. He misunderstood the instructions. Patient denies history of fevers or chills. He reports that his pain has been well controlled with his pain medications. He denies history of increased bleeding tendencies, loss of consciousness. However, he has been feeling more tired and sleepy.  ROS: General: no fevers, chills, changes in weight, changes in appetite Skin: no rash HEENT: no blurry vision, hearing changes, sore throat Pulm: no dyspnea, coughing, wheezing CV: no chest pain, palpitations, shortness of breath Abd: no abdominal pain, nausea/vomiting, diarrhea/constipation GU: no dysuria, hematuria, polyuria Ext: no arthralgias, myalgias Neuro: no weakness, numbness, or tingling  Filed Vitals:   12/16/12 1445  BP: 132/84  Pulse: 87  Temp: 97.1 F (36.2 C)    PEX General: alert, cooperative, and in no apparent distress HEENT: pupils equal round and reactive to light, vision grossly intact, oropharynx clear and non-erythematous  Neck: supple, no lymphadenopathy Lungs: clear to ascultation bilaterally, normal work of respiration, no wheezes, rales, ronchi Heart: regular rate and rhythm, no murmurs, gallops, or rubs Abdomen: soft, non-tender, non-distended, normal bowel sounds Extremities: no cyanosis, clubbing, or edema Neurologic: alert & oriented X3, cranial nerves II-XII intact, paraplegic, wheelchair dependent  Current Outpatient Prescriptions on File Prior to Visit  Medication Sig Dispense Refill  . hydrochlorothiazide (HYDRODIURIL) 25 MG  tablet Take 1 tablet (25 mg total) by mouth daily.  30 tablet  11  . lansoprazole (PREVACID) 30 MG capsule Take 30 mg by mouth daily.      Marland Kitchen lisinopril (PRINIVIL,ZESTRIL) 20 MG tablet TAKE 1 TABLET (20 MG TOTAL) BY MOUTH DAILY.  30 tablet  11  . Multiple Vitamin (MULTIVITAMIN WITH MINERALS) TABS Take 1 tablet by mouth daily.      Marland Kitchen zolpidem (AMBIEN CR) 12.5 MG CR tablet Take 12.5 mg by mouth at bedtime as needed. For sleep.      Marland Kitchen gabapentin (NEURONTIN) 300 MG capsule Take 1 capsule (300 mg total) by mouth 3 (three) times daily.  90 capsule  2  . zoster vaccine live, PF, (ZOSTAVAX) 40981 UNT/0.65ML injection Inject 19,400 Units into the skin once.  1 each  0    Assessment/Plan The assessment and plan has been discussed with Dr. Dalphine Handing as detailed under each problem.  In brief, his presentation is consistent with acetaminophen toxicity. Start labs have been orders and the [facility] notified. They will follow up. Start orders revealed Acetaminophen levels less than 15 with normal LFTs. Patient discharged from the clinic with instructions to limit use of tylenol to not more than 3 g in 24 hour period and only use it prn for breakthrough pain.

## 2012-12-21 NOTE — Progress Notes (Signed)
Poison control was called and notified about the patient. However, the subsequent CMP and INR were okay, tylenol levels were negligent, thus the patient did not receive any medication or treatment.

## 2012-12-22 DIAGNOSIS — L89309 Pressure ulcer of unspecified buttock, unspecified stage: Secondary | ICD-10-CM | POA: Diagnosis not present

## 2012-12-22 DIAGNOSIS — L8990 Pressure ulcer of unspecified site, unspecified stage: Secondary | ICD-10-CM | POA: Diagnosis not present

## 2012-12-30 ENCOUNTER — Encounter: Payer: BC Managed Care – PPO | Admitting: Internal Medicine

## 2013-01-08 DIAGNOSIS — I498 Other specified cardiac arrhythmias: Secondary | ICD-10-CM | POA: Diagnosis not present

## 2013-01-08 DIAGNOSIS — Z01811 Encounter for preprocedural respiratory examination: Secondary | ICD-10-CM | POA: Diagnosis not present

## 2013-01-12 ENCOUNTER — Other Ambulatory Visit: Payer: Self-pay | Admitting: *Deleted

## 2013-01-12 NOTE — Telephone Encounter (Signed)
Last filled 09/17/12 ??? Wife will get friday

## 2013-01-13 MED ORDER — OXYCODONE HCL 15 MG PO TABS: 15.0000 mg | ORAL_TABLET | ORAL | Status: DC | PRN

## 2013-01-27 ENCOUNTER — Encounter: Payer: Self-pay | Admitting: Internal Medicine

## 2013-01-27 ENCOUNTER — Ambulatory Visit (INDEPENDENT_AMBULATORY_CARE_PROVIDER_SITE_OTHER): Payer: BC Managed Care – PPO | Admitting: Internal Medicine

## 2013-01-27 VITALS — BP 131/78 | HR 73 | Temp 97.1°F

## 2013-01-27 LAB — BASIC METABOLIC PANEL
CO2: 25 mEq/L (ref 19–32)
Calcium: 9.1 mg/dL (ref 8.4–10.5)
Glucose, Bld: 99 mg/dL (ref 70–99)
Potassium: 3.5 mEq/L (ref 3.5–5.3)
Sodium: 133 mEq/L — ABNORMAL LOW (ref 135–145)

## 2013-01-27 MED ORDER — OXYCODONE-ACETAMINOPHEN 10-325 MG PO TABS: 1.0000 | ORAL_TABLET | Freq: Four times a day (QID) | ORAL | Status: DC | PRN

## 2013-01-27 MED ORDER — OXYCODONE HCL ER 15 MG PO T12A: 15.0000 mg | EXTENDED_RELEASE_TABLET | Freq: Two times a day (BID) | ORAL | Status: DC

## 2013-01-27 MED ORDER — AMOXICILLIN-POT CLAVULANATE 875-125 MG PO TABS: 1.0000 | ORAL_TABLET | Freq: Two times a day (BID) | ORAL | Status: AC

## 2013-01-27 NOTE — Patient Instructions (Signed)
General Instructions: Your blood pressure is well-controlled today, good job!!  For your pain, STOP taking the oxycodone 15 mg tablets -START taking Percocet 10-325 mg tablets, 1 tablet up to 4 times per day as needed for pain -START taking Oxycontin (12-hour pain reliever), 1 tablet twice per day for pain  Your sinus symptoms may represent a sinus infection.  Take Augmentin, 1 tablet twice per day for 7 days.  Please return for a follow-up visit in 6 months   Treatment Goals:  Goals (1 Years of Data) as of 01/27/13   None      Progress Toward Treatment Goals:  Treatment Goal 01/27/2013  Blood pressure at goal    Self Care Goals & Plans:  Self Care Goal 12/16/2012  Manage my medications bring my medications to every visit  Eat healthy foods eat foods that are low in salt       Care Management & Community Referrals:  Referral 01/27/2013  Referrals made for care management support none needed

## 2013-01-27 NOTE — Assessment & Plan Note (Signed)
BP Readings from Last 3 Encounters:  01/27/13 131/78  12/16/12 132/84  09/29/12 155/93    Lab Results  Component Value Date   NA 127* 12/16/2012   K 3.3* 12/16/2012   CREATININE 0.58 12/16/2012    Assessment:  Blood pressure control: controlled  Progress toward BP goal:  at goal  Comments: Well-controlled  Plan:  Medications:  continue current medications  Educational resources provided:    Self management tools provided:    Other plans: Continue current meds
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
## 2013-01-27 NOTE — Assessment & Plan Note (Signed)
The patient continues to note chronic pain, not improved by increasing the patient's oxycodone at our last visit. -stop oxy-IR 15 mg -restart oxycodone-acetaminophen 10-325, which the patient states worked better -add oxycontin 15 mg BID for long-acting pain relief

## 2013-01-27 NOTE — Assessment & Plan Note (Signed)
The patient presents with symptoms consistent with acute bacterial rhinosinusitis -treat with augmentin, 875-125 mg BID x7 days

## 2013-02-01 DIAGNOSIS — L8990 Pressure ulcer of unspecified site, unspecified stage: Secondary | ICD-10-CM | POA: Insufficient documentation

## 2013-02-12 ENCOUNTER — Ambulatory Visit: Payer: BC Managed Care – PPO | Admitting: Internal Medicine

## 2013-02-15 ENCOUNTER — Encounter: Payer: Self-pay | Admitting: Internal Medicine

## 2013-02-15 ENCOUNTER — Ambulatory Visit (INDEPENDENT_AMBULATORY_CARE_PROVIDER_SITE_OTHER): Payer: BC Managed Care – PPO | Admitting: Internal Medicine

## 2013-02-15 VITALS — BP 124/84 | HR 70 | Temp 97.1°F

## 2013-02-15 DIAGNOSIS — R220 Localized swelling, mass and lump, head: Secondary | ICD-10-CM

## 2013-02-15 MED ORDER — DIPHENHYDRAMINE HCL 25 MG PO CAPS: 25.0000 mg | ORAL_CAPSULE | Freq: Every evening | ORAL | Status: DC | PRN

## 2013-02-15 MED ORDER — LANSOPRAZOLE 30 MG PO CPDR: 30.0000 mg | DELAYED_RELEASE_CAPSULE | Freq: Every day | ORAL | Status: DC

## 2013-02-15 NOTE — Progress Notes (Signed)
Subjective:     Patient ID: Shawn [name]., male   DOB: Oct 22, 1951, 62 y.o.   MRN: [id number]  HPI The patient is a 62 year old male with past medical history of paraplegia, recurrent skin infection, GERD, sinus infection. He was recently hospitalized at [facility] for AA she'll skin flap. He was discharged last Monday one week ago and is coming in today for a followup for unknown reason. He states that he is supposed to see his surgeon on Friday for a followup with the skin flap. He was advised no weightbearing on the skin flap to prevent any damage and promote healing. He has been taking vitamin C and zinc since he got home and continues to take Ensure for extra nutrition. He states and his wife they're advised that he has had an area of redness directly below his left eye since leaving the hospital that's been a little bit puffy it has been growing over the past several days. He doesn't have any extra discharge from his thigh he is having slight gained nasal drainage down his throat however has not been very bad. No ear pain or ear discharge. No tinnitus. No fevers or chills at home. Otherwise he is doing well and has no other complaints. No shortness of breath, no problems swallowing, no swelling around his mouth or nose.  Review of Systems  Constitutional: Negative for fever, chills, diaphoresis, activity change, appetite change, fatigue and unexpected weight change.  HENT: Positive for facial swelling, rhinorrhea and postnasal drip. Negative for hearing loss, ear pain, nosebleeds, congestion, sore throat, sneezing, drooling, mouth sores, trouble swallowing, neck pain, neck stiffness, dental problem, voice change, sinus pressure, tinnitus and ear discharge.   Eyes: Negative for photophobia, pain, discharge, redness, itching and visual disturbance.  Respiratory: Negative for apnea, cough, choking, chest tightness, shortness of breath, wheezing and stridor.   Cardiovascular: Negative for chest  pain, palpitations and leg swelling.  Gastrointestinal: Negative.   Endocrine: Negative.   Genitourinary: Negative.   Skin: Negative.   Allergic/Immunologic: Negative.   Neurological: Negative.   Hematological: Negative.   Psychiatric/Behavioral: Negative.        Objective:   Physical Exam  Constitutional: He is oriented to person, place, and time. He appears well-developed and well-nourished.  In wheelchair and parplegic, able to wheel himself and seems energetic.   HENT:  Head: Normocephalic.  Right Ear: External ear normal.  Left Ear: External ear normal.  Mouth/Throat: Oropharynx is clear and moist.  Patient has area of redness on the left cheek directly below the eye not encroaching on the eye. No discharge or redness present in the eye. No warmth. No fluctuance to the area. No tenderness to palpation or manipulation of the area.   Eyes: Conjunctivae and EOM are normal. Pupils are equal, round, and reactive to light. Right eye exhibits no discharge. Left eye exhibits no discharge.  Neck: Normal range of motion. Neck supple. No JVD present. No tracheal deviation present. No thyromegaly present.  Cardiovascular: Normal rate and normal heart sounds.   Pulmonary/Chest: Effort normal and breath sounds normal. No respiratory distress. He has no wheezes. He has no rales.  Abdominal: Soft. Bowel sounds are normal. He exhibits no distension. There is no tenderness. There is no rebound and no guarding.  Musculoskeletal: He exhibits no edema and no tenderness.  Skin flap in ischial right however did not remove bandages to avoid damaging skin.  Lymphadenopathy:    He has no cervical adenopathy.  Neurological: He is  alert and oriented to person, place, and time. No cranial nerve deficit.  Skin: Skin is warm and dry. There is erythema.  Psychiatric: He has a normal mood and affect. His behavior is normal.       Assessment/Plan:   1. Swelling of the left eye-swelling does appear to be  edematous in nature and the skin is red but however is not warm to the touch and does not have any abscess formation appreciable. Will trial of Benadryl at nighttime to see if this alleviates some kind of possible allergic reaction to either tape or something he is exposed to during hospital stay. Does not appear to be infectious in nature and do not feel antibiotics would be needed at this time. Have advised that if the swelling grows in both the eyelids, mouth, difficulty breathing, this does grow, changes in vision he is to call our office and to possibly return or seek emergent care. If the swelling and the redness are not alleviating by Thursday of this week he is also to call our office and possibly may need additional advisement over the phone or in person. They should [facility] for their [facility] appointment.  2. Disposition-patient was given prescription for Benadryl pills and advised to take them at nighttime to avoid daytime drowsiness. He is advised that if this does not alleviate or get better by Thursday he is to call our office and possibly return. Otherwise return as previously scheduled.

## 2013-02-15 NOTE — Patient Instructions (Signed)
Take some benadryl at night time for your eye. If it is not looking better on Thursday please call our clinic. Come back and see [place] as scheduled if everything is going well.   The doctor you saw today was Dr. Dorise Hiss and our number is [phone].

## 2013-02-24 ENCOUNTER — Encounter: Payer: BC Managed Care – PPO | Admitting: Internal Medicine

## 2013-03-14 ENCOUNTER — Other Ambulatory Visit: Payer: Self-pay | Admitting: Internal Medicine

## 2013-03-17 NOTE — Telephone Encounter (Signed)
Called to pharm 

## 2013-04-20 ENCOUNTER — Other Ambulatory Visit: Payer: Self-pay | Admitting: *Deleted

## 2013-04-20 NOTE — Telephone Encounter (Signed)
Requesting 3 month supply since pt is in a w/c and transportation. Thanks

## 2013-04-20 NOTE — Telephone Encounter (Signed)
Will like to pick up rxs Thursday.  Thanks

## 2013-04-21 ENCOUNTER — Other Ambulatory Visit: Payer: Self-pay | Admitting: Internal Medicine

## 2013-04-21 MED ORDER — OXYCODONE HCL ER 15 MG PO T12A: 15.0000 mg | EXTENDED_RELEASE_TABLET | Freq: Two times a day (BID) | ORAL | Status: DC

## 2013-04-21 MED ORDER — OXYCODONE-ACETAMINOPHEN 10-325 MG PO TABS: 1.0000 | ORAL_TABLET | Freq: Four times a day (QID) | ORAL | Status: DC | PRN

## 2013-04-21 NOTE — Telephone Encounter (Signed)
I've printed the patient's prescriptions, and given them to Triage.  The patient may pick up all 3 scripts for the next 3 months at the same time.  Thank you.

## 2013-04-21 NOTE — Telephone Encounter (Signed)
Rxs ready for pick-up; pt called and message left.

## 2013-05-31 ENCOUNTER — Other Ambulatory Visit: Payer: Self-pay | Admitting: Internal Medicine

## 2013-07-15 ENCOUNTER — Ambulatory Visit (HOSPITAL_COMMUNITY)
Admission: RE | Admit: 2013-07-15 | Discharge: 2013-07-15 | Disposition: A | Discharge status: Home / Self Care | Payer: BC Managed Care – PPO | Source: Ambulatory Visit | Attending: Internal Medicine | Admitting: Internal Medicine

## 2013-07-15 ENCOUNTER — Ambulatory Visit (INDEPENDENT_AMBULATORY_CARE_PROVIDER_SITE_OTHER): Payer: BC Managed Care – PPO | Admitting: Internal Medicine

## 2013-07-15 ENCOUNTER — Encounter: Payer: Self-pay | Admitting: Internal Medicine

## 2013-07-15 VITALS — BP 139/82 | HR 68 | Temp 97.0°F

## 2013-07-15 DIAGNOSIS — G894 Chronic pain syndrome: Secondary | ICD-10-CM

## 2013-07-15 DIAGNOSIS — R002 Palpitations: Secondary | ICD-10-CM

## 2013-07-15 DIAGNOSIS — F17200 Nicotine dependence, unspecified, uncomplicated: Secondary | ICD-10-CM | POA: Insufficient documentation

## 2013-07-15 DIAGNOSIS — R9431 Abnormal electrocardiogram [ECG] [EKG]: Secondary | ICD-10-CM | POA: Diagnosis not present

## 2013-07-15 DIAGNOSIS — D649 Anemia, unspecified: Secondary | ICD-10-CM

## 2013-07-15 DIAGNOSIS — F5102 Adjustment insomnia: Secondary | ICD-10-CM

## 2013-07-15 DIAGNOSIS — G8220 Paraplegia, unspecified: Secondary | ICD-10-CM

## 2013-07-15 DIAGNOSIS — I10 Essential (primary) hypertension: Secondary | ICD-10-CM

## 2013-07-15 DIAGNOSIS — Z0000 Encounter for general adult medical examination without abnormal findings: Secondary | ICD-10-CM

## 2013-07-15 DIAGNOSIS — Z720 Tobacco use: Secondary | ICD-10-CM

## 2013-07-15 LAB — CBC
Platelets: 280 10*3/uL (ref 150–400)
RBC: 4.97 MIL/uL (ref 4.22–5.81)
RDW: 14.9 % (ref 11.5–15.5)
WBC: 8 10*3/uL (ref 4.0–10.5)

## 2013-07-15 MED ORDER — OXYCODONE-ACETAMINOPHEN 10-325 MG PO TABS: 1.0000 | ORAL_TABLET | Freq: Four times a day (QID) | ORAL | Status: DC | PRN

## 2013-07-15 MED ORDER — OXYCODONE HCL ER 15 MG PO T12A: 15.0000 mg | EXTENDED_RELEASE_TABLET | Freq: Two times a day (BID) | ORAL | Status: DC

## 2013-07-15 MED ORDER — NICOTINE POLACRILEX 4 MG MT GUM: 4.0000 mg | CHEWING_GUM | OROMUCOSAL | Status: DC | PRN
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
## 2013-07-15 NOTE — Assessment & Plan Note (Signed)
Pt still has not had his Zostavax administered but has rx at home and will do so in near future.

## 2013-07-15 NOTE — Assessment & Plan Note (Addendum)
Assessment: Patient endorses up to 2 minute episodes of palpitations that have been going on for the last 2 years but worsening in the last couple of months, only happens if he bends down to pick something up off the ground and quickly sits back up. Differential included orthostatic hypotension vs. Anemia vs. Atrial fibrillation vs. Autonomic dysfunction 2/2 spinal cord injury.  Pt unable to stand for orthostatic vitals though he does not appear dehydrated clinically and states he has had good PO with no V/D.  On exam, pt not tachycardic and rhythm was intermittently irregular.  We obtained an EKG and rhythm strip in clinic today, including during one of patient's episodes which showed sinus arrhythmia with occasional PVCs.  Possible sick sinus syndrome but only symptomatic when bending down and then quickly sitting up.   Plan: -requested EKG records from [facility] when pt had skin flap surgery earlier this year -continue to monitor, consider repeat EKG and rhythm strip at next visit; pt generally asymptomatic now -check CBC for anemia, B12 given pt's history of low-normal B12 and vitamin B injections in past

## 2013-07-15 NOTE — Patient Instructions (Signed)
Please follow-up with Dr. Manson Passey in October.  If your funny heart beat gets worse or starts happening when are you just sitting up, please let us know.  Also let us know if you experience any of the symptoms below.   Bradycardia You have a slow heart rate. This is called bradycardia. At rest, the normal heart rate is between 60-100 beats per minute. A slow heart may cause weakness, dizziness, loss of consciousness, and shortness of breath. This gets worse when you are active.  The medical causes of bradycardia can include:  Heart and thyroid problems.  High potassium.  Side effects of some medicines. Well-trained athletes may have heart rates as slow as 42 beats per minute. Evaluation of bradycardia may require an electrocardiogram (ECG), blood tests, and possibly other heart studies.  Bradycardia due to heart disease can be a serious problem. Damage to the heart's electrical system may require a temporary or permanent pacemaker. Beta-blocker drugs, digoxin, and other medicines used to control blood pressure and heart rhythms will also slow the heart. These medicines may need to be used in lower doses, or be stopped, if you have problems from the bradycardia.  SEEK IMMEDIATE MEDICAL CARE IF:   You develop fainting, extreme weakness, shortness of breath, or fever.  You develop severe chest or abdominal pain, repeated vomiting, or dehydration.  You become sweaty and weak. MAKE SURE YOU:   Understand these instructions.  Will watch your condition.  Will get help right away if you are not doing well or get worse. Document Released: 11/25/2005 Document Revised: 02/17/2012 Document Reviewed: 02/22/2009 [facility] Patient Information 2014 [place], [place].

## 2013-07-15 NOTE — Assessment & Plan Note (Signed)
Refilled pt's Percocet and oxycontin prescriptions today per pain contract with Dr. Manson Passey.  Unclear exactly how pt is taking these meds as he states that he is taking oxycontin twice daily, but he has a lot left over and his wife states he doesn't always take those.  Therefore, provided one month supply today.  Pt also states he cuts percocet pills in half and on some days only takes half of a pill as long as he has taken his oxycontin.  He states that on a bad day he takes up to 3 pills but then stated that he goes through the whole bottle of 120 tablets in a month... Provided two one month supplies today, one to be filled on 8/19, one on 9/19; he will see Dr. Manson Passey in clinic in mid-October.

## 2013-07-15 NOTE — Assessment & Plan Note (Signed)
Pt is having trouble sleeping at night after recently getting in the habit of taking 2-3 hour naps in the late afternoon.  He thinks his Ambien is no longer working and thus is not taking it lately.   -encouraged pt to limit naps during the day to 20-30 minutes, to go to bed earlier at night instead -advised pt to take a benadryl to help him fall asleep at night if he continues to have difficulty

## 2013-07-15 NOTE — Assessment & Plan Note (Signed)
  Assessment: Pt interested in quitting now, after 40 years of smoking.  He does not like using patches but willing to try nicotine gum.  Progress toward smoking cessation:  smoking the same amount Barriers to progress toward smoking cessation:  lack of motivation to quit  Plan:  Instruction/counseling given:  I counseled patient on the dangers of tobacco use, advised patient to stop smoking, and reviewed strategies to maximize success. Educational resources provided:  [employer]) brochure Self management tools provided:  smoking cessation plan (STAR Quit Plan) Medications to assist with smoking cessation:  Nicotine Gum Patient agreed to the following self-care plans for smoking cessation: call [employer] (1-800-QUIT-NOW)  Other plans: pt to follow-up in 2 months with PCP Dr. Manson Passey and will let us know how he is progressing

## 2013-07-15 NOTE — Assessment & Plan Note (Signed)
BP Readings from Last 3 Encounters:  07/15/13 139/82  02/15/13 124/84  01/27/13 131/78    Lab Results  Component Value Date   NA 133* 01/27/2013   K 3.5 01/27/2013   CREATININE 0.57 01/27/2013    Assessment: Blood pressure control: controlled Progress toward BP goal:  at goal   Plan: Medications:  continue current medications (lisinopril, HCTZ)

## 2013-07-16 LAB — VITAMIN B12: Vitamin B-12: 663 pg/mL (ref 211–911)

## 2013-07-19 NOTE — Progress Notes (Signed)
I saw and evaluated the patient.  I personally confirmed the key portions of the history and exam documented by Dr. Rogers and I reviewed pertinent patient test results.  The assessment, diagnosis, and plan were formulated together and I agree with the documentation in the resident's note. 

## 2013-07-22 ENCOUNTER — Encounter: Payer: Self-pay | Admitting: Internal Medicine

## 2013-08-19 DIAGNOSIS — N498 Inflammatory disorders of other specified male genital organs: Secondary | ICD-10-CM | POA: Diagnosis not present

## 2013-08-19 DIAGNOSIS — N360 Urethral fistula: Secondary | ICD-10-CM | POA: Diagnosis not present

## 2013-09-15 ENCOUNTER — Encounter: Payer: Self-pay | Admitting: Internal Medicine

## 2013-09-15 ENCOUNTER — Ambulatory Visit (INDEPENDENT_AMBULATORY_CARE_PROVIDER_SITE_OTHER): Payer: BC Managed Care – PPO | Admitting: Internal Medicine

## 2013-09-15 VITALS — BP 135/84 | HR 74 | Temp 97.6°F

## 2013-09-15 DIAGNOSIS — Z720 Tobacco use: Secondary | ICD-10-CM

## 2013-09-15 DIAGNOSIS — Z23 Encounter for immunization: Secondary | ICD-10-CM

## 2013-09-15 DIAGNOSIS — F17200 Nicotine dependence, unspecified, uncomplicated: Secondary | ICD-10-CM

## 2013-09-15 DIAGNOSIS — G894 Chronic pain syndrome: Secondary | ICD-10-CM

## 2013-09-15 MED ORDER — OXYCODONE-ACETAMINOPHEN 10-325 MG PO TABS: 1.0000 | ORAL_TABLET | ORAL | Status: DC | PRN

## 2013-09-15 MED ORDER — VARENICLINE TARTRATE 1 MG PO TABS: 1.0000 mg | ORAL_TABLET | Freq: Two times a day (BID) | ORAL | Status: DC

## 2013-09-15 NOTE — Assessment & Plan Note (Signed)
  Assessment: Progress toward smoking cessation:  smoking less Barriers to progress toward smoking cessation:  withdrawal symptoms Comments: Patient interested in quitting. We'll try Chantix.  Plan: Instruction/counseling given:  I counseled patient on the dangers of tobacco use, advised patient to stop smoking, and reviewed strategies to maximize success. Educational resources provided:  [employer]) brochure Self management tools provided:    Medications to assist with smoking cessation:  Varenicline (Chantix) Patient agreed to the following self-care plans for smoking cessation:    Other plans: Readdress at next visit

## 2013-09-15 NOTE — Assessment & Plan Note (Signed)
The patient has a history of chronic pain, which is still only partially controlled -d/c Oxycontin -increase oxycodone 10-325 from #120 to #150/month

## 2013-09-15 NOTE — Progress Notes (Signed)
HPI The patient is a 62 y.o. male with a history of paraplegia, chronic pain syndrome, hypertension, tobacco abuse, presenting for a followup visit.  The patient continues to note chronic pain, described as a sharp pain, radiating from his back to his hip, down his right medial leg, which awakens him from sleep occasionally.  The patient has been using a mattress overlay to help with symptoms.  The patient stopped taking OxyContin, due to perceived ineffectiveness, and has been running out of his oxycodone early.  The patient is still smoking 1/2-1/3 pack/day.  The patient wants to quit.  He has tried nicotine patch, with no success.  He is interested in Chantix, but unsure if insurance will cover it.  ROS: General: no fevers, chills, changes in weight, changes in appetite Skin: no rash HEENT: no blurry vision, hearing changes, sore throat Pulm: no dyspnea, coughing, wheezing CV: no chest pain, palpitations, shortness of breath Abd: no abdominal pain, nausea/vomiting, diarrhea/constipation GU: no dysuria, hematuria, polyuria Ext: see HPI Neuro: see HPI  Filed Vitals:   09/15/13 1346  BP: 135/84  Pulse: 74  Temp: 97.6 F (36.4 C)    PEX General: sitting in wheelchair, NAD HEENT: pupils equal round and reactive to light, vision grossly intact, oropharynx clear and non-erythematous  Neck: supple Lungs: clear to ascultation bilaterally, normal work of respiration, no wheezes, rales, ronchi Heart: regular rate and rhythm, no murmurs, gallops, or rubs Abdomen: soft, non-tender, non-distended, normal bowel sounds Extremities: no cyanosis, clubbing, or edema Neurologic: alert & oriented X3, cranial nerves II-XII intact  Current Outpatient Prescriptions on File Prior to Visit  Medication Sig Dispense Refill  . Ascorbic Acid (VITAMIN C) 100 MG tablet Take 100 mg by mouth daily. Dose unknown      . diphenhydrAMINE (BENADRYL) 25 mg capsule Take 1-2 capsules (25-50 mg total) by mouth at  bedtime as needed.  24 capsule  0  . gabapentin (NEURONTIN) 300 MG capsule Take 1 capsule (300 mg total) by mouth 3 (three) times daily.  90 capsule  2  . hydrochlorothiazide (HYDRODIURIL) 25 MG tablet Take 1 tablet (25 mg total) by mouth daily.  30 tablet  11  . lansoprazole (PREVACID) 30 MG capsule TAKE 1 CAPSULE (30 MG TOTAL) BY MOUTH DAILY.  90 capsule  3  . lisinopril (PRINIVIL,ZESTRIL) 20 MG tablet TAKE 1 TABLET (20 MG TOTAL) BY MOUTH DAILY.  30 tablet  11  . Multiple Vitamin (MULTIVITAMIN WITH MINERALS) TABS Take 1 tablet by mouth daily.      . nicotine polacrilex (NICORETTE) 4 MG gum Take 1 each (4 mg total) by mouth as needed for smoking cessation.  100 tablet  0  . OxyCODONE (OXYCONTIN) 15 mg T12A 12 hr tablet Take 1 tablet (15 mg total) by mouth every 12 (twelve) hours.  60 tablet  0  . oxyCODONE-acetaminophen (PERCOCET) 10-325 MG per tablet Take 1 tablet by mouth every 6 (six) hours as needed for pain.  120 tablet  0  . oxyCODONE-acetaminophen (PERCOCET) 10-325 MG per tablet Take 1 tablet by mouth every 6 (six) hours as needed for pain.  120 tablet  0  . zinc gluconate 50 MG tablet Take 50 mg by mouth daily. Dose unknown      . zolpidem (AMBIEN CR) 12.5 MG CR tablet TAKE 1 TABLET BY MOUTH AT BEDTIME  30 tablet  2  . zoster vaccine live, PF, (ZOSTAVAX) 21308 UNT/0.65ML injection Inject 19,400 Units into the skin once.  1 each  0  No current facility-administered medications on file prior to visit.    Assessment/Plan

## 2013-09-15 NOTE — Patient Instructions (Signed)
General Instructions: For your chronic pain: 1. STOP taking oxycontin (long-acting pain medicine) 2. We are increasing your Oxycodone-Acetaminophen to 150 tabs/month  To help you stop smoking, we are starting Chantix. 1. Start taking this medication 1 week before your quit date 2. For Days 1-3, take only 1/2 of a tablet once per day 3. For Days 4-7, take only 1/2 of a tablet twice per day 4. After that, take 1 tablet twice per day for 3 months  Please return for a follow-up visit in 6 months.  Treatment Goals:  Goals (1 Years of Data) as of 09/15/13   None      Progress Toward Treatment Goals:  Treatment Goal 09/15/2013  Blood pressure at goal  Stop smoking smoking less    Self Care Goals & Plans:  Self Care Goal 09/15/2013  Manage my medications take my medicines as prescribed; bring my medications to every visit; refill my medications on time; follow the sick day instructions if I am sick  Monitor my health keep track of my blood pressure  Eat healthy foods eat more vegetables; eat fruit for snacks and desserts; eat baked foods instead of fried foods; eat smaller portions  Be physically active find an activity I enjoy  Stop smoking -    No flowsheet data found.   Care Management & Community Referrals:  Referral 09/15/2013  Referrals made for care management support none needed

## 2013-09-20 NOTE — Progress Notes (Signed)
Case discussed with Dr. Brown at the time of the visit.  We reviewed the resident's history and exam and pertinent patient test results.  I agree with the assessment, diagnosis and plan of care documented in the resident's note. 

## 2013-10-07 ENCOUNTER — Other Ambulatory Visit: Payer: Self-pay | Admitting: Internal Medicine

## 2013-12-06 ENCOUNTER — Other Ambulatory Visit: Payer: Self-pay | Admitting: *Deleted

## 2013-12-06 MED ORDER — HYDROCHLOROTHIAZIDE 25 MG PO TABS: 25.0000 mg | ORAL_TABLET | Freq: Every day | ORAL | Status: DC

## 2013-12-13 DIAGNOSIS — N139 Obstructive and reflux uropathy, unspecified: Secondary | ICD-10-CM | POA: Diagnosis not present

## 2013-12-28 ENCOUNTER — Other Ambulatory Visit: Payer: Self-pay | Admitting: *Deleted

## 2013-12-28 DIAGNOSIS — G894 Chronic pain syndrome: Secondary | ICD-10-CM

## 2013-12-28 MED ORDER — OXYCODONE-ACETAMINOPHEN 10-325 MG PO TABS: 1.0000 | ORAL_TABLET | ORAL | Status: DC | PRN

## 2014-01-11 DIAGNOSIS — N139 Obstructive and reflux uropathy, unspecified: Secondary | ICD-10-CM | POA: Diagnosis not present

## 2014-01-24 ENCOUNTER — Other Ambulatory Visit: Payer: Self-pay | Admitting: *Deleted

## 2014-01-24 MED ORDER — ZOLPIDEM TARTRATE ER 12.5 MG PO TBCR: EXTENDED_RELEASE_TABLET | ORAL | Status: DC

## 2014-01-24 NOTE — Telephone Encounter (Signed)
I phoned in to pharmacy

## 2014-01-28 ENCOUNTER — Other Ambulatory Visit: Payer: Self-pay | Admitting: *Deleted

## 2014-01-28 DIAGNOSIS — G894 Chronic pain syndrome: Secondary | ICD-10-CM

## 2014-01-28 MED ORDER — OXYCODONE-ACETAMINOPHEN 10-325 MG PO TABS: 1.0000 | ORAL_TABLET | ORAL | Status: DC | PRN

## 2014-01-28 NOTE — Telephone Encounter (Signed)
Pt's wifeis here at the clinic now to pick up rx- states she had called Wed of this week.  I will ask The Attending to refill rx.

## 2014-01-28 NOTE — Telephone Encounter (Signed)
Rx given to pt's wife.

## 2014-02-23 ENCOUNTER — Other Ambulatory Visit: Payer: Self-pay | Admitting: *Deleted

## 2014-02-23 DIAGNOSIS — G894 Chronic pain syndrome: Secondary | ICD-10-CM

## 2014-02-24 MED ORDER — OXYCODONE-ACETAMINOPHEN 10-325 MG PO TABS: 1.0000 | ORAL_TABLET | ORAL | Status: DC | PRN

## 2014-04-11 ENCOUNTER — Ambulatory Visit (INDEPENDENT_AMBULATORY_CARE_PROVIDER_SITE_OTHER): Payer: BC Managed Care – PPO | Admitting: Internal Medicine

## 2014-04-11 ENCOUNTER — Encounter: Payer: Self-pay | Admitting: Internal Medicine

## 2014-04-11 VITALS — BP 149/85 | HR 82 | Temp 98.4°F | Ht 73.5 in

## 2014-04-11 DIAGNOSIS — I10 Essential (primary) hypertension: Secondary | ICD-10-CM

## 2014-04-11 DIAGNOSIS — G894 Chronic pain syndrome: Secondary | ICD-10-CM

## 2014-04-11 DIAGNOSIS — Z720 Tobacco use: Secondary | ICD-10-CM

## 2014-04-11 DIAGNOSIS — F17200 Nicotine dependence, unspecified, uncomplicated: Secondary | ICD-10-CM

## 2014-04-11 DIAGNOSIS — G8220 Paraplegia, unspecified: Secondary | ICD-10-CM

## 2014-04-11 LAB — BASIC METABOLIC PANEL
BUN: 13 mg/dL (ref 6–23)
CO2: 24 mEq/L (ref 19–32)
Calcium: 9.5 mg/dL (ref 8.4–10.5)
Chloride: 101 mEq/L (ref 96–112)
Creat: 0.67 mg/dL (ref 0.50–1.35)
GLUCOSE: 114 mg/dL — AB (ref 70–99)
Potassium: 4 mEq/L (ref 3.5–5.3)
Sodium: 136 mEq/L (ref 135–145)

## 2014-04-11 LAB — LIPID PANEL
Cholesterol: 137 mg/dL (ref 0–200)
HDL: 28 mg/dL — AB (ref >39)
LDL Cholesterol: 76 mg/dL (ref 0–99)
TRIGLYCERIDES: 164 mg/dL — AB (ref <150)
Total CHOL/HDL Ratio: 4.9 Ratio
VLDL: 33 mg/dL (ref 0–40)

## 2014-04-11 LAB — CBC
HCT: 44.3 % (ref 39.0–52.0)
Hemoglobin: 15.9 g/dL (ref 13.0–17.0)
MCH: 31.4 pg (ref 26.0–34.0)
MCHC: 35.9 g/dL (ref 30.0–36.0)
MCV: 87.5 fL (ref 78.0–100.0)
Platelets: 284 10*3/uL (ref 150–400)
RBC: 5.06 MIL/uL (ref 4.22–5.81)
RDW: 14.4 % (ref 11.5–15.5)
WBC: 7.1 10*3/uL (ref 4.0–10.5)

## 2014-04-11 MED ORDER — OXYCODONE-ACETAMINOPHEN 10-325 MG PO TABS: 1.0000 | ORAL_TABLET | ORAL | Status: DC | PRN

## 2014-04-11 MED ORDER — LANSOPRAZOLE 30 MG PO CPDR: 30.0000 mg | DELAYED_RELEASE_CAPSULE | Freq: Every day | ORAL | Status: DC

## 2014-04-11 MED ORDER — ZOLPIDEM TARTRATE ER 12.5 MG PO TBCR: 12.5000 mg | EXTENDED_RELEASE_TABLET | Freq: Every day | ORAL | Status: DC

## 2014-04-11 MED ORDER — BUPROPION HCL ER (SR) 150 MG PO TB12: 150.0000 mg | ORAL_TABLET | Freq: Two times a day (BID) | ORAL | Status: DC

## 2014-04-11 NOTE — Progress Notes (Signed)
HPI The patient is a 63 y.o. male with a history of paraplegia, chronic pain, and tobacco abuse, presenting for a follow-up visit for tobacco abuse.  The patient is still smoking about 1/2 pack/day.  He was unable to afford Chantix, which was going to cost > $200.  He is interested in quitting.  He has tried quitting in the past, max 4-5 months, cold [place].  The patient tried using nicotine patch in the past, but quit due to nausea.    The patient requests a new wheelchair, stating that he believes his current chair is too small for him.  The patient's Percocet dose was increased at his last visit. He notes significant improvement in pain since that time.  ROS: General: no fevers, chills, changes in weight, changes in appetite Skin: no rash HEENT: no blurry vision, hearing changes, sore throat Pulm: no dyspnea, coughing, wheezing CV: no chest pain, palpitations, shortness of breath Abd: no abdominal pain, nausea/vomiting, diarrhea/constipation GU: no dysuria, hematuria, polyuria Ext: no arthralgias, myalgias Neuro: no weakness, numbness, or tingling  Filed Vitals:   04/11/14 1033  BP: 149/85  Pulse: 82  Temp: 98.4 F (36.9 C)    PEX General: alert, cooperative, and in no apparent distress HEENT: pupils equal round and reactive to light, vision grossly intact, oropharynx clear and non-erythematous  Neck: supple Lungs: clear to ascultation bilaterally, normal work of respiration, no wheezes, rales, ronchi Heart: regular rate and rhythm, no murmurs, gallops, or rubs Abdomen: soft, non-tender, non-distended, normal bowel sounds Extremities: no cyanosis, clubbing, or edema Neurologic: alert & oriented X3, cranial nerves II-XII intact, upper extremity strength intact  Current Outpatient Prescriptions on File Prior to Visit  Medication Sig Dispense Refill  . Ascorbic Acid (VITAMIN C) 100 MG tablet Take 100 mg by mouth daily. Dose unknown      . diphenhydrAMINE (BENADRYL) 25 mg  capsule Take 1-2 capsules (25-50 mg total) by mouth at bedtime as needed.  24 capsule  0  . gabapentin (NEURONTIN) 300 MG capsule Take 1 capsule (300 mg total) by mouth 3 (three) times daily.  90 capsule  2  . hydrochlorothiazide (HYDRODIURIL) 25 MG tablet Take 1 tablet (25 mg total) by mouth daily.  30 tablet  11  . lansoprazole (PREVACID) 30 MG capsule TAKE 1 CAPSULE (30 MG TOTAL) BY MOUTH DAILY.  90 capsule  3  . lisinopril (PRINIVIL,ZESTRIL) 20 MG tablet TAKE 1 TABLET (20 MG TOTAL) BY MOUTH DAILY.  30 tablet  11  . Multiple Vitamin (MULTIVITAMIN WITH MINERALS) TABS Take 1 tablet by mouth daily.      . nicotine polacrilex (NICORETTE) 4 MG gum Take 1 each (4 mg total) by mouth as needed for smoking cessation.  100 tablet  0  . OxyCODONE (OXYCONTIN) 15 mg T12A 12 hr tablet Take 1 tablet (15 mg total) by mouth every 12 (twelve) hours.  60 tablet  0  . oxyCODONE-acetaminophen (PERCOCET) 10-325 MG per tablet Take 1 tablet by mouth every 4 (four) hours as needed for pain.  150 tablet  0  . varenicline (CHANTIX) 1 MG tablet Take 1 tablet (1 mg total) by mouth 2 (two) times daily. For the first week, follow instructions given by your physician  60 tablet  2  . zinc gluconate 50 MG tablet Take 50 mg by mouth daily. Dose unknown      . zolpidem (AMBIEN CR) 12.5 MG CR tablet TAKE 1 TABLET BY MOUTH AT BEDTIME  30 tablet  1  . zoster  vaccine live, PF, (ZOSTAVAX) 08657 UNT/0.65ML injection Inject 19,400 Units into the skin once.  1 each  0   No current facility-administered medications on file prior to visit.    Assessment/Plan

## 2014-04-11 NOTE — Progress Notes (Signed)
Case discussed with Dr. Brown at the time of the visit.  We reviewed the resident's history and exam and pertinent patient test results.  I agree with the assessment, diagnosis, and plan of care documented in the resident's note. 

## 2014-04-11 NOTE — Patient Instructions (Signed)
General Instructions: To help you quit smoking, we are prescribing Wellbutrin. -start 1 week prior to your planned quit date -for the first 3 days, take just 1 tablet once per day -after that, take 1 tablet twice per day  Please return for a follow-up visit in 3-6 months.  Treatment Goals:  Goals (1 Years of Data) as of 04/11/14   None      Progress Toward Treatment Goals:  Treatment Goal 04/11/2014  Blood pressure at goal  Stop smoking smoking the same amount    Self Care Goals & Plans:  Self Care Goal 09/15/2013  Manage my medications take my medicines as prescribed; bring my medications to every visit; refill my medications on time; follow the sick day instructions if I am sick  Monitor my health keep track of my blood pressure  Eat healthy foods eat more vegetables; eat fruit for snacks and desserts; eat baked foods instead of fried foods; eat smaller portions  Be physically active find an activity I enjoy  Stop smoking -    No flowsheet data found.   Care Management & Community Referrals:  Referral 04/11/2014  Referrals made for care management support none needed

## 2014-04-11 NOTE — Assessment & Plan Note (Signed)
  Assessment: Progress toward smoking cessation:  smoking the same amount Barriers to progress toward smoking cessation:  withdrawal symptoms Comments: The patient was unable to afford Chantix. However he is still interested in quitting smoking. We will try Wellbutrin.  Plan: Instruction/counseling given:  I counseled patient on the dangers of tobacco use, advised patient to stop smoking, and reviewed strategies to maximize success. Educational resources provided:    Self management tools provided:    Medications to assist with smoking cessation:  Bupropion (Zyban) Patient agreed to the following self-care plans for smoking cessation:    Other plans: Reassess at next visit

## 2014-04-11 NOTE — Assessment & Plan Note (Signed)
Placed order for wheelchair adjustment/replacement.

## 2014-07-27 ENCOUNTER — Other Ambulatory Visit: Payer: Self-pay | Admitting: *Deleted

## 2014-07-27 DIAGNOSIS — G894 Chronic pain syndrome: Secondary | ICD-10-CM

## 2014-07-27 MED ORDER — OXYCODONE-ACETAMINOPHEN 10-325 MG PO TABS: 1.0000 | ORAL_TABLET | ORAL | Status: DC | PRN

## 2014-08-01 ENCOUNTER — Ambulatory Visit (INDEPENDENT_AMBULATORY_CARE_PROVIDER_SITE_OTHER): Payer: BC Managed Care – PPO | Admitting: Internal Medicine

## 2014-08-01 VITALS — BP 140/85 | HR 75 | Temp 97.6°F | Ht 73.0 in

## 2014-08-01 DIAGNOSIS — F17200 Nicotine dependence, unspecified, uncomplicated: Secondary | ICD-10-CM

## 2014-08-01 DIAGNOSIS — Z720 Tobacco use: Secondary | ICD-10-CM

## 2014-08-01 DIAGNOSIS — D649 Anemia, unspecified: Secondary | ICD-10-CM

## 2014-08-01 DIAGNOSIS — G894 Chronic pain syndrome: Secondary | ICD-10-CM

## 2014-08-01 DIAGNOSIS — G4700 Insomnia, unspecified: Secondary | ICD-10-CM

## 2014-08-01 DIAGNOSIS — F5102 Adjustment insomnia: Secondary | ICD-10-CM

## 2014-08-01 LAB — BASIC METABOLIC PANEL WITH GFR
BUN: 9 mg/dL (ref 6–23)
CHLORIDE: 99 meq/L (ref 96–112)
CO2: 27 meq/L (ref 19–32)
Calcium: 9.3 mg/dL (ref 8.4–10.5)
Creat: 0.65 mg/dL (ref 0.50–1.35)
GFR, Est African American: >89 mL/min
Glucose, Bld: 115 mg/dL — ABNORMAL HIGH (ref 70–99)
POTASSIUM: 3.3 meq/L — AB (ref 3.5–5.3)
Sodium: 134 mEq/L — ABNORMAL LOW (ref 135–145)

## 2014-08-01 LAB — CBC WITH DIFFERENTIAL/PLATELET
Basophils Absolute: 0 10*3/uL (ref 0.0–0.1)
Basophils Relative: 0 % (ref 0–1)
Eosinophils Absolute: 0.2 10*3/uL (ref 0.0–0.7)
Eosinophils Relative: 2 % (ref 0–5)
HEMATOCRIT: 45.7 % (ref 39.0–52.0)
Hemoglobin: 16.1 g/dL (ref 13.0–17.0)
Lymphocytes Relative: 24 % (ref 12–46)
Lymphs Abs: 1.8 10*3/uL (ref 0.7–4.0)
MCH: 31.2 pg (ref 26.0–34.0)
MCHC: 35.2 g/dL (ref 30.0–36.0)
MCV: 88.6 fL (ref 78.0–100.0)
MONOS PCT: 8 % (ref 3–12)
Monocytes Absolute: 0.6 10*3/uL (ref 0.1–1.0)
NEUTROS ABS: 5 10*3/uL (ref 1.7–7.7)
Neutrophils Relative %: 66 % (ref 43–77)
Platelets: 268 10*3/uL (ref 150–400)
RBC: 5.16 MIL/uL (ref 4.22–5.81)
RDW: 14.4 % (ref 11.5–15.5)
WBC: 7.6 10*3/uL (ref 4.0–10.5)

## 2014-08-01 LAB — TSH: TSH: 0.452 u[IU]/mL (ref 0.350–4.500)

## 2014-08-01 MED ORDER — OXYCODONE-ACETAMINOPHEN 10-325 MG PO TABS
1.0000 | ORAL_TABLET | ORAL | Status: DC | PRN
Start: 2014-09-29 — End: 2014-08-01

## 2014-08-01 MED ORDER — ZOLPIDEM TARTRATE ER 6.25 MG PO TBCR: 6.2500 mg | EXTENDED_RELEASE_TABLET | Freq: Every evening | ORAL | Status: DC | PRN

## 2014-08-01 MED ORDER — OXYCODONE-ACETAMINOPHEN 10-325 MG PO TABS: 1.0000 | ORAL_TABLET | ORAL | Status: DC | PRN

## 2014-08-01 MED ORDER — BUPROPION HCL ER (SR) 150 MG PO TB12
150.0000 mg | ORAL_TABLET | Freq: Two times a day (BID) | ORAL | Status: DC
Start: 2014-08-01 — End: 2017-01-08

## 2014-08-01 NOTE — Patient Instructions (Addendum)
General Instructions:   Please take wellbutrin 150mg  twice a day to help with smoking cessation. If your insurance does not cover this medication we will consider nicotine patches again.   Also, I gave you a prescription for ambien 6.25 mg to take as needed at night. Stop taking the 12.5mg  ambien dose.    Thank you for bringing your medicines today. This helps [place] keep you safe from mistakes.   Progress Toward Treatment Goals:  Treatment Goal 04/11/2014  Blood pressure at goal  Stop smoking smoking the same amount    Self Care Goals & Plans:  Self Care Goal 08/01/2014  Manage my medications take my medicines as prescribed; bring my medications to every visit; refill my medications on time  Monitor my health -  Eat healthy foods eat more vegetables; eat foods that are low in salt; eat baked foods instead of fried foods  Be physically active find an activity I enjoy  Stop smoking cut down the number of cigarettes smoked      Care Management & Community Referrals:  Referral 04/11/2014  Referrals made for care management support none needed
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
## 2014-08-04 NOTE — Assessment & Plan Note (Signed)
Per chart review pt has hx of microcytic anemia that appears stable, checked CBC for low Hgb that could be causing sluggish feeling.  - CBC results came back stable at 16.1 vs 15.9 on 04/11/14. Likely sluggish feeling due to drowsiness from high ambien dose. See insomnia problem for more details.

## 2014-08-04 NOTE — Assessment & Plan Note (Addendum)
Pt currently taking ambien 12.5 mg q2-3 days for sleep. Notes that 12.5mg  makes him drowsy for 2 days. Also complaining of feeling sluggish.  - TSH today was WNL, however on the low side, will check free T4 - if pt still feeling sluggish at next visit will check B12 although CBC today revealed microcytic anemia rather than macrocytic - rx for ambien 6.25mg  and pt instructed to discard ambien 12.mg and not to cut it in half  Update 08/05/14: BMP revealed K+ of 3.3, rx for KDur 38mEq once a day x 4 days. Called in rx to [facility]. Called patient and spoke w/ him to confirm medication at 12:25 PM 8/28

## 2014-08-04 NOTE — Assessment & Plan Note (Signed)
Pt was given rx for wellbutrin to help w/ smoking cessation. He never picked up the prescription. Will give patient prescription for wellbutrin today. Informed that insurance may not pay for smoking cessation pills ( previously denied chantix) and that he may have to use nicotine patches which he has tried in the past but did not like.

## 2014-08-04 NOTE — Assessment & Plan Note (Signed)
Pt has pain contract w/ clinic for percocet 10-325 q4h #150. Pt last filled his rx on 8/23. Gave pt paper refills for the month of September and October. Wrote one for December as well but voided it b/c he last had one refill on August making it a total of 3 months worth of refills that he has now.

## 2014-08-05 LAB — T4, FREE: FREE T4: 1.38 ng/dL (ref 0.80–1.80)

## 2014-08-05 MED ORDER — POTASSIUM CHLORIDE ER 10 MEQ PO TBCR: 20.0000 meq | EXTENDED_RELEASE_TABLET | Freq: Every day | ORAL | Status: DC

## 2014-08-05 NOTE — Progress Notes (Signed)
Internal Medicine Clinic Attending Date of visit: 08/01/2014  I saw and evaluated the patient.  I personally confirmed the key portions of the history and exam documented by Dr. Hulen Luster and I reviewed pertinent patient test results.  The assessment, diagnosis, and plan were formulated together and I agree with the documentation in the resident's note.

## 2014-08-05 NOTE — Addendum Note (Signed)
Addended by: Norman Herrlich on: 08/05/2014 12:30 PM   Modules accepted: Orders

## 2014-09-16 DIAGNOSIS — N492 Inflammatory disorders of scrotum: Secondary | ICD-10-CM | POA: Diagnosis not present

## 2014-09-16 DIAGNOSIS — N360 Urethral fistula: Secondary | ICD-10-CM | POA: Diagnosis not present

## 2014-10-20 DIAGNOSIS — N360 Urethral fistula: Secondary | ICD-10-CM | POA: Diagnosis not present

## 2014-10-24 ENCOUNTER — Other Ambulatory Visit: Payer: Self-pay | Admitting: *Deleted

## 2014-10-24 MED ORDER — LISINOPRIL 20 MG PO TABS: ORAL_TABLET | ORAL | Status: DC

## 2014-10-25 ENCOUNTER — Encounter: Payer: BC Managed Care – PPO | Admitting: Internal Medicine

## 2014-10-26 ENCOUNTER — Encounter: Payer: Self-pay | Admitting: Internal Medicine

## 2014-10-26 ENCOUNTER — Ambulatory Visit (INDEPENDENT_AMBULATORY_CARE_PROVIDER_SITE_OTHER): Payer: BLUE CROSS/BLUE SHIELD | Admitting: *Deleted

## 2014-10-26 ENCOUNTER — Ambulatory Visit (INDEPENDENT_AMBULATORY_CARE_PROVIDER_SITE_OTHER): Payer: BLUE CROSS/BLUE SHIELD | Admitting: Internal Medicine

## 2014-10-26 VITALS — BP 143/89 | HR 60 | Temp 98.0°F

## 2014-10-26 DIAGNOSIS — I10 Essential (primary) hypertension: Secondary | ICD-10-CM

## 2014-10-26 DIAGNOSIS — Z23 Encounter for immunization: Secondary | ICD-10-CM | POA: Diagnosis not present

## 2014-10-26 DIAGNOSIS — G894 Chronic pain syndrome: Secondary | ICD-10-CM

## 2014-10-26 DIAGNOSIS — G4700 Insomnia, unspecified: Secondary | ICD-10-CM

## 2014-10-26 DIAGNOSIS — Z0000 Encounter for general adult medical examination without abnormal findings: Secondary | ICD-10-CM

## 2014-10-26 DIAGNOSIS — F5102 Adjustment insomnia: Secondary | ICD-10-CM

## 2014-10-26 DIAGNOSIS — Z720 Tobacco use: Secondary | ICD-10-CM

## 2014-10-26 LAB — BASIC METABOLIC PANEL WITH GFR
BUN: 9 mg/dL (ref 6–23)
CALCIUM: 8.8 mg/dL (ref 8.4–10.5)
CHLORIDE: 94 meq/L — AB (ref 96–112)
CO2: 22 mEq/L (ref 19–32)
CREATININE: 0.64 mg/dL (ref 0.50–1.35)
GFR, Est African American: >89 mL/min
GFR, Est Non African American: >89 mL/min
GLUCOSE: 114 mg/dL — AB (ref 70–99)
Potassium: 3.1 mEq/L — ABNORMAL LOW (ref 3.5–5.3)
Sodium: 129 mEq/L — ABNORMAL LOW (ref 135–145)

## 2014-10-26 MED ORDER — ZOLPIDEM TARTRATE ER 6.25 MG PO TBCR: 6.2500 mg | EXTENDED_RELEASE_TABLET | Freq: Every evening | ORAL | Status: DC | PRN

## 2014-10-26 MED ORDER — OXYCODONE-ACETAMINOPHEN 10-325 MG PO TABS: 1.0000 | ORAL_TABLET | ORAL | Status: DC | PRN

## 2014-10-26 NOTE — Assessment & Plan Note (Signed)
Pt received flu shot today. Pt given paper script for zoster vaccine and agrees to fill it

## 2014-10-26 NOTE — Assessment & Plan Note (Signed)
BP Readings from Last 3 Encounters:  10/26/14 143/89  08/01/14 140/85  04/11/14 149/85    Lab Results  Component Value Date   NA 134* 08/01/2014   K 3.3* 08/01/2014   CREATININE 0.65 08/01/2014    Assessment: Blood pressure control: mildly elevated Progress toward BP goal:  deteriorated Comments: Shawn [name] says he checks BP at home and it generally is 120s-130s SBP. Since it is borderline elevated today, we will keep his Rx as is and recheck in 3 months  Plan: Medications:  continue current medications HCTZ 25 mg daily, lisinopril 20 mg daily Educational resources provided:   Self management tools provided:   Other plans: check BMET given hypokalemia in 07/2014.
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
## 2014-10-26 NOTE — Assessment & Plan Note (Signed)
  Assessment: Progress toward smoking cessation:  smoking less Barriers to progress toward smoking cessation:    Comments: says he is down to 1/2 ppd. He has bupropion rx but is not taking. He says nicotine patch has not helped in past. He is not interested in other assistance.  Plan: Instruction/counseling given:  I counseled patient on the dangers of tobacco use, advised patient to stop smoking, and reviewed strategies to maximize success. Educational resources provided:    Self management tools provided:    Medications to assist with smoking cessation:  None Patient agreed to the following self-care plans for smoking cessation: set a quit date and stop smoking  Other plans: continue to assess readiness for quitting

## 2014-10-26 NOTE — Progress Notes (Signed)
Internal Medicine Clinic Attending  Case discussed with Dr. Ethelene [name] at the time of the visit.  We reviewed the resident's history and exam and pertinent patient test results.  I agree with the assessment, diagnosis, and plan of care documented in the resident's note. The EMR noted that the Shawn [name] had [employer]. Dr  Shawn [name] had no issues with the pt's care which required me to see the pt. However, when the note arrived in my inbasket, the pt had medicare, maybe as a secondary insurance. I have sent email to Shawn [name] and Shawn [name] to delete charge and not to send charge to medicare since I did not see the pt.
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
## 2014-10-26 NOTE — Patient Instructions (Signed)
It was a pleasure to see you today. Please go get the shingles vaccine. Please write down your blood pressures at home and bring the log with you to your next visit. Please return to clinic or seek medical attention if you have any new or worsening chest pain, trouble breathing, or other worrisome medical condition. We look forward to seeing you again in 3 months.  Lottie Mussel, MD  General Instructions:   Thank you for bringing your medicines today. This helps [place] keep you safe from mistakes.   Progress Toward Treatment Goals:  Treatment Goal 04/11/2014  Blood pressure at goal  Stop smoking smoking the same amount    Self Care Goals & Plans:  Self Care Goal 10/26/2014  Manage my medications take my medicines as prescribed; bring my medications to every visit; refill my medications on time  Monitor my health -  Eat healthy foods eat more vegetables; eat foods that are low in salt; eat baked foods instead of fried foods  Be physically active find an activity I enjoy  Stop smoking set a quit date and stop smoking    No flowsheet data found.   Care Management & Community Referrals:  Referral 04/11/2014  Referrals made for care management support none needed

## 2014-10-27 ENCOUNTER — Telehealth: Payer: Self-pay | Admitting: Internal Medicine

## 2014-10-27 DIAGNOSIS — E876 Hypokalemia: Secondary | ICD-10-CM

## 2014-10-27 LAB — PRESCRIPTION ABUSE MONITORING 15P, URINE
AMPHETAMINE/METH: NEGATIVE ng/mL
Barbiturate Screen, Urine: NEGATIVE ng/mL
Benzodiazepine Screen, Urine: NEGATIVE ng/mL
Buprenorphine, Urine: NEGATIVE ng/mL
CANNABINOID SCRN UR: NEGATIVE ng/mL
COCAINE METABOLITES: NEGATIVE ng/mL
CREATININE, URINE: 26.41 mg/dL (ref >20.0)
Carisoprodol, Urine: NEGATIVE ng/mL
Fentanyl, Ur: NEGATIVE ng/mL
METHADONE SCREEN, URINE: NEGATIVE ng/mL
Meperidine, Ur: NEGATIVE ng/mL
PROPOXYPHENE: NEGATIVE ng/mL
Tramadol Scrn, Ur: NEGATIVE ng/mL
Zolpidem, Urine: NEGATIVE ng/mL

## 2014-10-27 MED ORDER — POTASSIUM CHLORIDE CRYS ER 20 MEQ PO TBCR: 20.0000 meq | EXTENDED_RELEASE_TABLET | Freq: Every day | ORAL | Status: DC

## 2014-10-27 NOTE — Telephone Encounter (Signed)
I called Mr Uddin at approximately 12:55 pm to tell him his potassium was low at 3.1. I told him I will call him a one week prescription for potassium supplement KDur 87mEq x 7 tab and he should take it once daily. He was instructed to come back next Wednesday 11/25 for a lab only BMP draw to reassess his hypokalemia. He agreed and had no questions.  Lottie Mussel, MD 10/1914 1:00 pm

## 2014-10-31 LAB — OXYCODONE, URINE (LC/MS-MS)
Noroxycodone, Ur: 2349 ng/mL — ABNORMAL HIGH (ref <50)
OXYMORPHONE, URINE: 984 ng/mL — AB (ref <50)
Oxycodone, ur: 1089 ng/mL — ABNORMAL HIGH (ref <50)

## 2014-10-31 LAB — OPIATES/OPIOIDS (LC/MS-MS)
CODEINE URINE: NEGATIVE ng/mL (ref <50)
HYDROMORPHONE: NEGATIVE ng/mL (ref <50)
Hydrocodone: NEGATIVE ng/mL (ref <50)
Morphine Urine: NEGATIVE ng/mL (ref <50)
Norhydrocodone, Ur: NEGATIVE ng/mL (ref <50)
Noroxycodone, Ur: 2349 ng/mL — ABNORMAL HIGH (ref <50)
OXYMORPHONE, URINE: 984 ng/mL — AB (ref <50)
Oxycodone, ur: 1089 ng/mL — ABNORMAL HIGH (ref <50)

## 2014-11-14 ENCOUNTER — Encounter: Payer: Self-pay | Admitting: *Deleted

## 2014-11-21 ENCOUNTER — Other Ambulatory Visit (INDEPENDENT_AMBULATORY_CARE_PROVIDER_SITE_OTHER): Payer: BLUE CROSS/BLUE SHIELD

## 2014-11-21 DIAGNOSIS — G8220 Paraplegia, unspecified: Secondary | ICD-10-CM

## 2014-11-21 DIAGNOSIS — E876 Hypokalemia: Secondary | ICD-10-CM | POA: Diagnosis not present

## 2014-11-22 LAB — BASIC METABOLIC PANEL WITH GFR
BUN: 11 mg/dL (ref 6–23)
CHLORIDE: 97 meq/L (ref 96–112)
CO2: 21 meq/L (ref 19–32)
Calcium: 9.2 mg/dL (ref 8.4–10.5)
Creat: 0.66 mg/dL (ref 0.50–1.35)
GFR, Est African American: >89 mL/min
GFR, Est Non African American: >89 mL/min
Glucose, Bld: 111 mg/dL — ABNORMAL HIGH (ref 70–99)
Potassium: 3.7 mEq/L (ref 3.5–5.3)
Sodium: 131 mEq/L — ABNORMAL LOW (ref 135–145)

## 2014-12-21 DIAGNOSIS — N319 Neuromuscular dysfunction of bladder, unspecified: Secondary | ICD-10-CM | POA: Diagnosis not present

## 2014-12-28 ENCOUNTER — Other Ambulatory Visit: Payer: Self-pay | Admitting: *Deleted

## 2014-12-28 MED ORDER — HYDROCHLOROTHIAZIDE 25 MG PO TABS: 25.0000 mg | ORAL_TABLET | Freq: Every day | ORAL | Status: DC

## 2015-01-18 DIAGNOSIS — N319 Neuromuscular dysfunction of bladder, unspecified: Secondary | ICD-10-CM | POA: Diagnosis not present

## 2015-01-30 ENCOUNTER — Encounter: Payer: Self-pay | Admitting: Internal Medicine

## 2015-01-30 ENCOUNTER — Ambulatory Visit (INDEPENDENT_AMBULATORY_CARE_PROVIDER_SITE_OTHER): Payer: BLUE CROSS/BLUE SHIELD | Admitting: Internal Medicine

## 2015-01-30 VITALS — BP 163/104 | HR 67 | Temp 98.3°F | Ht 73.0 in

## 2015-01-30 DIAGNOSIS — G894 Chronic pain syndrome: Secondary | ICD-10-CM

## 2015-01-30 DIAGNOSIS — I10 Essential (primary) hypertension: Secondary | ICD-10-CM

## 2015-01-30 DIAGNOSIS — H269 Unspecified cataract: Secondary | ICD-10-CM | POA: Diagnosis not present

## 2015-01-30 MED ORDER — OXYCODONE-ACETAMINOPHEN 10-325 MG PO TABS: 1.0000 | ORAL_TABLET | ORAL | Status: DC | PRN

## 2015-01-30 NOTE — Progress Notes (Signed)
   Subjective:    Patient ID: Shawn [name]., male    DOB: 02/10/1951, 64 y.o.   MRN: [id number]  HPI  Shawn [name] is a 64 year old man with HTN, HL, paraplegia (spinal cord injury in [place]) here for routine follow-up. Please see problem-based charting assessment and plan note for further details of medical issues addressed at today's visit. His only complaint is that he sometimes notices a glare and asked if I see any cataracts in his eyes. Hsi chronic pain is unchanged.    Review of Systems  Constitutional: Negative for fever, chills and diaphoresis.  Respiratory: Negative for cough and shortness of breath.   Cardiovascular: Negative for chest pain.  Gastrointestinal: Negative for nausea, vomiting, abdominal pain, diarrhea and constipation.  Musculoskeletal: Positive for arthralgias.  Neurological: Positive for weakness. Negative for light-headedness, numbness and headaches.       Objective:   Physical Exam  Constitutional: He is oriented to person, place, and time. He appears well-developed and well-nourished. No distress.  In wheelchair  HENT:  Head: Normocephalic and atraumatic.  Mouth/Throat: Oropharynx is clear and moist.  Eyes: EOM are normal. Pupils are equal, round, and reactive to light.  Mild b/l cataracts  Cardiovascular: Normal rate, regular rhythm, normal heart sounds and intact distal pulses.  Exam reveals no gallop and no friction rub.   No murmur heard. Pulmonary/Chest: Effort normal and breath sounds normal. No respiratory distress. He has no wheezes.  Abdominal: Soft. Bowel sounds are normal. He exhibits no distension. There is no tenderness.  Neurological: He is alert and oriented to person, place, and time. No cranial nerve deficit.  Skin: He is not diaphoretic.          Assessment & Plan:

## 2015-01-30 NOTE — Patient Instructions (Signed)
It was a pleasure to see you today. Please check your blood pressure every day and bring a log with the daily blood pressure recorded with you next time. Bring your blood pressure cuff too. Please return to clinic or seek medical attention if you have any new or worsening chest pain, trouble breathing, or other worrisome medical condition. We look forward to seeing you again in one month.  Lottie Mussel, MD  General Instructions:   Thank you for bringing your medicines today. This helps [place] keep you safe from mistakes.   Progress Toward Treatment Goals:  Treatment Goal 01/30/2015  Blood pressure deteriorated  Stop smoking -    Self Care Goals & Plans:  Self Care Goal 10/26/2014  Manage my medications take my medicines as prescribed; bring my medications to every visit; refill my medications on time  Monitor my health -  Eat healthy foods eat more vegetables; eat foods that are low in salt; eat baked foods instead of fried foods  Be physically active find an activity I enjoy  Stop smoking set a quit date and stop smoking    No flowsheet data found.   Care Management & Community Referrals:  Referral 04/11/2014  Referrals made for care management support none needed

## 2015-01-30 NOTE — Assessment & Plan Note (Signed)
Mr Bovey has mild b/l cataracts on exam. He asked me if he has them and would like referral to go to his wife's ophthalmologist -ophthalmology referral

## 2015-01-30 NOTE — Assessment & Plan Note (Signed)
He reports no change in his baseline chronic pain. He had pain contract w Dr Owens Shark. UDS on last visit was appropriately positive for opiates. -cont oxycodone-acetaminophen 10-325 q4hprn #150 x 3 months

## 2015-01-30 NOTE — Assessment & Plan Note (Addendum)
BP Readings from Last 3 Encounters:  01/30/15 163/104  10/26/14 143/89  08/01/14 140/85    Lab Results  Component Value Date   NA 131* 11/21/2014   K 3.7 11/21/2014   CREATININE 0.66 11/21/2014    Assessment: Blood pressure control: mildly elevated Progress toward BP goal:  deteriorated Comments: BP 163/104 and then on repeat 160/90. He says he has not been eating as well recently and has been drinking more soda, more ham and steak. He and his wife would like to not make medication changes unless necessary. They have a cuff at home and say at home he runs 130s-150s.  Plan: Medications:  continue current medications Educational resources provided:   Self management tools provided:   Other plans: RTC in 1 month for recheck and consider increasing lisinopril v starting amlodipine at that time. He is to bring his cuff and one month BP log after trying to improve diet.

## 2015-02-01 ENCOUNTER — Encounter: Payer: BC Managed Care – PPO | Admitting: Internal Medicine

## 2015-02-01 NOTE — Progress Notes (Signed)
INTERNAL MEDICINE TEACHING ATTENDING ADDENDUM - Lilyona Richner, MD: I reviewed and discussed at the time of visit with the resident Dr. Rothman, the patient's medical history, physical examination, diagnosis and results of pertinent tests and treatment and I agree with the patient's care as documented.  

## 2015-02-24 ENCOUNTER — Ambulatory Visit (INDEPENDENT_AMBULATORY_CARE_PROVIDER_SITE_OTHER): Payer: BLUE CROSS/BLUE SHIELD | Admitting: Internal Medicine

## 2015-02-24 ENCOUNTER — Encounter: Payer: Self-pay | Admitting: Internal Medicine

## 2015-02-24 VITALS — BP 164/91 | HR 73 | Temp 98.2°F

## 2015-02-24 DIAGNOSIS — G478 Other sleep disorders: Secondary | ICD-10-CM | POA: Diagnosis not present

## 2015-02-24 DIAGNOSIS — I10 Essential (primary) hypertension: Secondary | ICD-10-CM | POA: Diagnosis not present

## 2015-02-24 DIAGNOSIS — F5102 Adjustment insomnia: Secondary | ICD-10-CM | POA: Diagnosis not present

## 2015-02-24 MED ORDER — LISINOPRIL 40 MG PO TABS: 40.0000 mg | ORAL_TABLET | Freq: Every day | ORAL | Status: DC

## 2015-02-24 NOTE — Assessment & Plan Note (Signed)
Shawn [name] is having issues sleeping. He says he is up most night and then sleeps during the day in blocks of several naps. He nods off during the day. His wife has been working night shifts 4 nights a week and he says she keeps him up when she is not working. I believe this is poor sleep hygiene related to his wife's work schedule. She is scheduled to switch to days 3/21 so I educated him on good sleep hygiene. Hemoglobin 16.1 07/2014 and TSH 0.45 at same time. He is currently on ambien cr 6.25 qhsprn -sleep hygiene -ambien cr 6.25 qhsprn -reassess at next visit

## 2015-02-24 NOTE — Assessment & Plan Note (Signed)
BP Readings from Last 3 Encounters:  02/24/15 164/91  01/30/15 163/104  10/26/14 143/89    Lab Results  Component Value Date   NA 131* 11/21/2014   K 3.7 11/21/2014   CREATININE 0.66 11/21/2014    Assessment: Blood pressure control: moderately elevated Progress toward BP goal:  unchanged Comments: BP still elevated at 169/99 and 164/91 on repeat while taking lisinopril 20 mg daily, HCTZ 25 mg daily. He did not want to increase his medicines last visit because his readings were low at home. He brought in his cuff today which was accurate on comparison. He is agreeable to increasing his medicines.  Plan: Medications:  Continue HCTZ 25 mg daily. Increase lisinopril to 40 mg daily Educational resources provided:   Self management tools provided:   Other plans: RTC 1 month and recheck BMP at that time

## 2015-02-24 NOTE — Patient Instructions (Signed)
It was a pleasure to see you today.We are increasing your lisinopril. You are to take two 20 mg tabs until you run out of the pills. Then when you refill the prescription it will be one 40 mg pill a day. Please return to clinic or seek medical attention if you have any new or worsening chest pain, trouble breathing, or other worrisome medical condition. We look forward to seeing you again in one month for blood pressure recheck and blood work.  Lottie Mussel, MD  General Instructions:   Thank you for bringing your medicines today. This helps [place] keep you safe from mistakes.   Progress Toward Treatment Goals:  Treatment Goal 02/24/2015  Blood pressure unchanged  Stop smoking -    Self Care Goals & Plans:  Self Care Goal 10/26/2014  Manage my medications take my medicines as prescribed; bring my medications to every visit; refill my medications on time  Monitor my health -  Eat healthy foods eat more vegetables; eat foods that are low in salt; eat baked foods instead of fried foods  Be physically active find an activity I enjoy  Stop smoking set a quit date and stop smoking    No flowsheet data found.   Care Management & Community Referrals:  Referral 04/11/2014  Referrals made for care management support none needed
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
## 2015-02-27 NOTE — Progress Notes (Signed)
Internal Medicine Clinic Attending  Case discussed with Dr. Rothman at the time of the visit.  We reviewed the resident's history and exam and pertinent patient test results.  I agree with the assessment, diagnosis, and plan of care documented in the resident's note. 

## 2015-03-22 ENCOUNTER — Encounter: Payer: Self-pay | Admitting: Internal Medicine

## 2015-03-22 ENCOUNTER — Ambulatory Visit (INDEPENDENT_AMBULATORY_CARE_PROVIDER_SITE_OTHER): Payer: BLUE CROSS/BLUE SHIELD | Admitting: Internal Medicine

## 2015-03-22 VITALS — BP 164/93 | HR 74 | Temp 97.5°F

## 2015-03-22 DIAGNOSIS — I10 Essential (primary) hypertension: Secondary | ICD-10-CM

## 2015-03-22 LAB — BASIC METABOLIC PANEL WITH GFR
BUN: 9 mg/dL (ref 6–23)
CALCIUM: 8.9 mg/dL (ref 8.4–10.5)
CO2: 25 mEq/L (ref 19–32)
CREATININE: 0.58 mg/dL (ref 0.50–1.35)
Chloride: 98 mEq/L (ref 96–112)
GFR, Est Non African American: >89 mL/min
GLUCOSE: 95 mg/dL (ref 70–99)
Potassium: 3.5 mEq/L (ref 3.5–5.3)
Sodium: 133 mEq/L — ABNORMAL LOW (ref 135–145)

## 2015-03-22 MED ORDER — AMLODIPINE BESYLATE 5 MG PO TABS: 5.0000 mg | ORAL_TABLET | Freq: Every day | ORAL | Status: DC

## 2015-03-22 NOTE — Progress Notes (Signed)
Internal Medicine Clinic Attending  Case discussed with Dr. Rothman at the time of the visit.  We reviewed the resident's history and exam and pertinent patient test results.  I agree with the assessment, diagnosis, and plan of care documented in the resident's note. 

## 2015-03-22 NOTE — Assessment & Plan Note (Addendum)
BP Readings from Last 3 Encounters:  03/22/15 164/93  02/24/15 164/91  01/30/15 163/104    Lab Results  Component Value Date   NA 131* 11/21/2014   K 3.7 11/21/2014   CREATININE 0.66 11/21/2014    Assessment: Blood pressure control: moderately elevated Progress toward BP goal:  unchanged Comments:  At last visit on 3/18, his lisinopril was increased from 20 mg daily to 40 mg daily. He was continued on HCTZ 25 mg daily. BP still elevated today at 164/93. He checks his BP at home and says he has SBP in the 140s-160s the past month (cuff brought last visit and compared to ours was measuring close values).   Plan: Medications: Continue HCTZ 25 mg daily, lisinopril 40 mg daily. Start amlodipine 5 mg daily. Educational resources provided:  BP log Self management tools provided:   Other plans: BMP today. RTC 1 month for reassessment with completed log.  ADDENDUM: BMP w K 3.5. Na mildly low at 133. Consider recheck in one month.   Lottie Mussel, MD Internal Medicine, PGY-1 Pager [phone] 03/23/2015, 7:27 AM

## 2015-03-22 NOTE — Patient Instructions (Signed)
It was a pleasure to see you today. Continue your old medicines. We have also started a new medicine called amlodipine 5 mg once a day for your blood pressure. We have given you a blood pressure log. Please return to clinic or seek medical attention if you have any new or worsening chest pain, trouble breathing, or other worrisome medical condition. We look forward to seeing you again in one month.  Lottie Mussel, MD  General Instructions:   Thank you for bringing your medicines today. This helps [place] keep you safe from mistakes.   Progress Toward Treatment Goals:  Treatment Goal 03/22/2015  Blood pressure unchanged  Stop smoking -    Self Care Goals & Plans:  Self Care Goal 03/22/2015  Manage my medications take my medicines as prescribed; bring my medications to every visit; refill my medications on time  Monitor my health -  Eat healthy foods eat more vegetables; eat foods that are low in salt; eat baked foods instead of fried foods  Be physically active (No Data)  Stop smoking set a quit date and stop smoking    No flowsheet data found.   Care Management & Community Referrals:  Referral 04/11/2014  Referrals made for care management support none needed
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
## 2015-04-05 ENCOUNTER — Encounter: Payer: Self-pay | Admitting: *Deleted

## 2015-04-20 ENCOUNTER — Ambulatory Visit (INDEPENDENT_AMBULATORY_CARE_PROVIDER_SITE_OTHER): Payer: BLUE CROSS/BLUE SHIELD | Admitting: Internal Medicine

## 2015-04-20 ENCOUNTER — Encounter: Payer: Self-pay | Admitting: Internal Medicine

## 2015-04-20 VITALS — BP 143/85 | HR 72 | Temp 98.2°F | Ht 73.0 in

## 2015-04-20 DIAGNOSIS — G894 Chronic pain syndrome: Secondary | ICD-10-CM

## 2015-04-20 DIAGNOSIS — F17210 Nicotine dependence, cigarettes, uncomplicated: Secondary | ICD-10-CM

## 2015-04-20 DIAGNOSIS — I10 Essential (primary) hypertension: Secondary | ICD-10-CM | POA: Diagnosis not present

## 2015-04-20 MED ORDER — OXYCODONE-ACETAMINOPHEN 10-325 MG PO TABS: 1.0000 | ORAL_TABLET | ORAL | Status: DC | PRN

## 2015-04-20 MED ORDER — AMLODIPINE BESYLATE 5 MG PO TABS
5.0000 mg | ORAL_TABLET | Freq: Every day | ORAL | Status: DC
Start: 2015-04-20 — End: 2015-05-17

## 2015-04-20 NOTE — Patient Instructions (Signed)
General Instructions:   Thank you for bringing your medicines today. This helps [place] keep you safe from mistakes.   Good luck with the raffle! Your blood pressure is right where it should be keep up the good work. Have a good summer. Follow up in 3-6 months    Progress Toward Treatment Goals:  Treatment Goal 03/22/2015  Blood pressure unchanged  Stop smoking -    Self Care Goals & Plans:  Self Care Goal 04/20/2015  Manage my medications take my medicines as prescribed; bring my medications to every visit; refill my medications on time  Monitor my health -  Eat healthy foods drink diet soda or water instead of juice or soda; eat more vegetables; eat foods that are low in salt; eat baked foods instead of fried foods; eat fruit for snacks and desserts  Be physically active -  Stop smoking -    No flowsheet data found.   Care Management & Community Referrals:  Referral 04/11/2014  Referrals made for care management support none needed

## 2015-04-20 NOTE — Progress Notes (Signed)
Subjective:   Patient ID: Shawn [name]. male   DOB: [birth year] 64 y.o.   MRN: [id number]  HPI: [name] is a 64 y.o. man with a past medical history as listed below who presents for follow-up on his blood pressure.  Patient had a recent changes to his medications that included HCTZ 2500 daily, lisinopril 40 mg daily and starting amlodipine 5 mg daily. Patient reports strict compliance with this treatment plan. He brings in his log for review which shows that since making these changes he's had blood pressures in the 133-142/80-90 range. He has not had any chest pain, shortness of breath, dizziness, or lower extremity edema.  He also requests refill on his pain medication for the upcoming months as outlined per his pain contract.  Past Medical History  Diagnosis Date  . Substance abuse     alcohol  . Hyperlipidemia   . Hypertension   . GERD (gastroesophageal reflux disease)   . Allergy   . Paraplegia     secondary to [place]  . C. difficile colitis 11/2008  . Decubitus ulcer 1999    excision of right ischial pressure sore with flap reconstruction done by Dr. Denese Killings 10/31/2008  . Perineal abscess    Current Outpatient Prescriptions  Medication Sig Dispense Refill  . amLODipine (NORVASC) 5 MG tablet Take 1 tablet (5 mg total) by mouth daily. 30 tablet 1  . buPROPion (WELLBUTRIN SR) 150 MG 12 hr tablet Take 1 tablet (150 mg total) by mouth 2 (two) times daily. 60 tablet 5  . hydrochlorothiazide (HYDRODIURIL) 25 MG tablet Take 1 tablet (25 mg total) by mouth daily. 90 tablet 3  . lansoprazole (PREVACID) 30 MG capsule Take 1 capsule (30 mg total) by mouth daily. 90 capsule 3  . lisinopril (PRINIVIL,ZESTRIL) 40 MG tablet Take 1 tablet (40 mg total) by mouth daily. 90 tablet 0  . Multiple Vitamin (MULTIVITAMIN WITH MINERALS) TABS Take 1 tablet by mouth daily.    Marland Kitchen oxyCODONE-acetaminophen (PERCOCET) 10-325 MG per tablet Take 1 tablet by mouth every 4 (four) hours as needed  for pain. 150 tablet 0  . oxyCODONE-acetaminophen (PERCOCET) 10-325 MG per tablet Take 1 tablet by mouth every 4 (four) hours as needed for pain. 150 tablet 0  . zolpidem (AMBIEN CR) 6.25 MG CR tablet Take 1 tablet (6.25 mg total) by mouth at bedtime as needed for sleep. 30 tablet 2  . zoster vaccine live, PF, (ZOSTAVAX) 56812 UNT/0.65ML injection Inject 19,400 Units into the skin once. (Patient not taking: Reported on 01/30/2015) 1 each 0   No current facility-administered medications for this visit.   Family History  Problem Relation Age of Onset  . Stroke Mother   . Coronary artery disease Father   . Coronary artery disease Paternal Uncle   . Coronary artery disease Paternal Aunt    History   Social History  . Marital Status: Married    Spouse Name: N/A  . Number of Children: 1  . Years of Education: N/A   Occupational History  . DISABILITY    Social History Main Topics  . Smoking status: Current Every Day Smoker -- 0.50 packs/day for 40 years    Types: Cigarettes    Last Attempt to Quit: 08/09/2008  . Smokeless tobacco: Never Used     Comment: 1PPD LAST 2 1/2 DAYS  . Alcohol Use: No     Comment: Remote ETOH abuse  . Drug Use: No  . Sexual Activity: No  Other Topics Concern  . None   Social History Narrative   Married, disabled, medicaid, functions independently at home, paraplegic      Family hx of : hypertension, diabetes, kidney disease, and other cancer   Review of Systems: Pertinent items are noted in HPI. Objective:  Physical Exam: Filed Vitals:   04/20/15 1353  BP: 143/85  Pulse: 72  Temp: 98.2 F (36.8 C)  TempSrc: Oral  Height: 6\' 1"  (1.854 m)  SpO2: 98%   General: sitting in wheelchair, NAD  Cardiac: RRR, no rubs, murmurs or gallops Pulm: clear to auscultation bilaterally, moving normal volumes of air Abd: soft, nontender, nondistended, BS present Ext: warm and well perfused, no pedal edema Neuro: alert and oriented X3, cranial nerves II-XII  grossly intact  Assessment & Plan:  Please see problem oriented charting  Pt discussed with Dr. Eppie Gibson

## 2015-04-20 NOTE — Assessment & Plan Note (Signed)
Refill of pain medication given for May 23, June 23, and July 23

## 2015-04-20 NOTE — Addendum Note (Signed)
Addended by: Oval Linsey D on: 04/20/2015 03:08 PM   Modules accepted: Level of Service

## 2015-04-20 NOTE — Assessment & Plan Note (Addendum)
BP Readings from Last 3 Encounters:  04/20/15 143/85  03/22/15 164/93  02/24/15 164/91    Lab Results  Component Value Date   NA 133* 03/22/2015   K 3.5 03/22/2015   CREATININE 0.58 03/22/2015    Assessment: Blood pressure control:   Progress toward BP goal:    Comments: pt at goal per review of his log with SBP in 133-140/83-90  Plan: Medications:  continue current medications of HCTZ 25 daily, amlodipine 5 mg daily, and lisinopril 40 g daily Educational resources provided: brochure (denies) Self management tools provided:   Other plans: f/u in 3 months  Should be considered that if pt is still running slightly elevated readings to increase amlodipine to 10mg 

## 2015-04-20 NOTE — Progress Notes (Signed)
Case discussed with Dr. Sadek soon after the resident saw the patient.  We reviewed the resident's history and exam and pertinent patient test results.  I agree with the assessment, diagnosis, and plan of care documented in the resident's note. 

## 2015-05-17 ENCOUNTER — Other Ambulatory Visit: Payer: Self-pay | Admitting: *Deleted

## 2015-05-17 DIAGNOSIS — I10 Essential (primary) hypertension: Secondary | ICD-10-CM

## 2015-05-17 MED ORDER — AMLODIPINE BESYLATE 5 MG PO TABS: 5.0000 mg | ORAL_TABLET | Freq: Every day | ORAL | Status: DC

## 2015-05-17 NOTE — Telephone Encounter (Signed)
This medication was just filled on 5/21 but pharmacy is asking for a 90 day supply.  Can you change and resend?

## 2015-05-21 ENCOUNTER — Other Ambulatory Visit: Payer: Self-pay | Admitting: Internal Medicine

## 2015-06-15 ENCOUNTER — Other Ambulatory Visit: Payer: Self-pay | Admitting: Internal Medicine

## 2015-06-16 NOTE — Telephone Encounter (Signed)
Message sent to front desk

## 2015-06-16 NOTE — Telephone Encounter (Signed)
Needs PCP appt early to mid Aug for pain med refill. [facility] appt not yet in Epic bc [place] month.

## 2015-07-11 ENCOUNTER — Ambulatory Visit (INDEPENDENT_AMBULATORY_CARE_PROVIDER_SITE_OTHER): Payer: BLUE CROSS/BLUE SHIELD | Admitting: Internal Medicine

## 2015-07-11 ENCOUNTER — Encounter: Payer: Self-pay | Admitting: Internal Medicine

## 2015-07-11 VITALS — BP 131/77 | HR 76 | Temp 98.3°F

## 2015-07-11 DIAGNOSIS — K219 Gastro-esophageal reflux disease without esophagitis: Secondary | ICD-10-CM

## 2015-07-11 DIAGNOSIS — F17210 Nicotine dependence, cigarettes, uncomplicated: Secondary | ICD-10-CM

## 2015-07-11 DIAGNOSIS — G894 Chronic pain syndrome: Secondary | ICD-10-CM | POA: Diagnosis not present

## 2015-07-11 DIAGNOSIS — E785 Hyperlipidemia, unspecified: Secondary | ICD-10-CM

## 2015-07-11 DIAGNOSIS — Z0000 Encounter for general adult medical examination without abnormal findings: Secondary | ICD-10-CM

## 2015-07-11 DIAGNOSIS — I10 Essential (primary) hypertension: Secondary | ICD-10-CM | POA: Diagnosis not present

## 2015-07-11 DIAGNOSIS — F5102 Adjustment insomnia: Secondary | ICD-10-CM

## 2015-07-11 DIAGNOSIS — Z720 Tobacco use: Secondary | ICD-10-CM

## 2015-07-11 MED ORDER — PRAVASTATIN SODIUM 40 MG PO TABS: 40.0000 mg | ORAL_TABLET | Freq: Every evening | ORAL | Status: DC

## 2015-07-11 MED ORDER — OXYCODONE-ACETAMINOPHEN 10-325 MG PO TABS: 1.0000 | ORAL_TABLET | ORAL | Status: DC | PRN

## 2015-07-11 MED ORDER — LISINOPRIL 40 MG PO TABS: ORAL_TABLET | ORAL | Status: DC

## 2015-07-11 MED ORDER — ZOLPIDEM TARTRATE ER 6.25 MG PO TBCR: 6.2500 mg | EXTENDED_RELEASE_TABLET | Freq: Every evening | ORAL | Status: DC | PRN

## 2015-07-11 NOTE — Assessment & Plan Note (Signed)
Refill of pain medication given for August 23, September 23, October 23.

## 2015-07-11 NOTE — Patient Instructions (Signed)
General Instructions: Thank you for coming today. We will start you on Pravastatin. We are checking your liver function today and will need to see you back in 6-8 weeks for a recheck. Please stop taking the Amlodipine as we discussed today. Please continue checking your BP at home.    Treatment Goals:  Goals (1 Years of Data) as of 07/11/15    None      Progress Toward Treatment Goals:  Treatment Goal 03/22/2015  Blood pressure unchanged  Stop smoking -    Self Care Goals & Plans:  Self Care Goal 04/20/2015  Manage my medications take my medicines as prescribed; bring my medications to every visit; refill my medications on time  Monitor my health -  Eat healthy foods drink diet soda or water instead of juice or soda; eat more vegetables; eat foods that are low in salt; eat baked foods instead of fried foods; eat fruit for snacks and desserts  Be physically active -  Stop smoking -    No flowsheet data found.   Care Management & Community Referrals:  Referral 04/11/2014  Referrals made for care management support none needed

## 2015-07-11 NOTE — Assessment & Plan Note (Signed)
Patient's calculated risk on ACC/AHA CV Risk Calculator is 28.7%. Will start pravastain today. - Check LFTs to get baseline labs prior to starting statin - f/u in 6-8 to recheck labs

## 2015-07-11 NOTE — Progress Notes (Signed)
Patient ID: Shawn Grandchild., male   DOB: 1951-03-20, 64 y.o.   MRN: [id number]   Subjective:   Patient ID: Shawn Grandchild. male   DOB: 1951-03-24 16 y.o.   MRN: [id number]  HPI: Shawn L Jeno Calleros. is a 64 y.o. male with a PMH of HTN, GERD, HLD, paraplegia (2/2 spinal cord injury in [place]) here for follow up on his BP and prescription refills. No complaints. Please see problem based charting assessment and plan note for further details of medical issues addressed at today's visit.  Past Medical History  Diagnosis Date  . Substance abuse     alcohol  . Hyperlipidemia   . Hypertension   . GERD (gastroesophageal reflux disease)   . Allergy   . Paraplegia     secondary to [place]  . C. difficile colitis 11/2008  . Decubitus ulcer 1999    excision of right ischial pressure sore with flap reconstruction done by Dr. Denese Killings 10/31/2008  . Perineal abscess    Current Outpatient Prescriptions  Medication Sig Dispense Refill  . amLODipine (NORVASC) 5 MG tablet Take 1 tablet (5 mg total) by mouth daily. 90 tablet 0  . buPROPion (WELLBUTRIN SR) 150 MG 12 hr tablet Take 1 tablet (150 mg total) by mouth 2 (two) times daily. 60 tablet 5  . hydrochlorothiazide (HYDRODIURIL) 25 MG tablet Take 1 tablet (25 mg total) by mouth daily. 90 tablet 3  . lansoprazole (PREVACID) 30 MG capsule TAKE 1 CAPSULE (30 MG TOTAL) BY MOUTH DAILY. 90 capsule 3  . lisinopril (PRINIVIL,ZESTRIL) 40 MG tablet TAKE 1 TABLET (40 MG TOTAL) BY MOUTH DAILY. 90 tablet 0  . Multiple Vitamin (MULTIVITAMIN WITH MINERALS) TABS Take 1 tablet by mouth daily.    Marland Kitchen oxyCODONE-acetaminophen (PERCOCET) 10-325 MG per tablet Take 1 tablet by mouth every 4 (four) hours as needed for pain. Do not fill until 10/01/2015 150 tablet 0  . oxyCODONE-acetaminophen (PERCOCET) 10-325 MG per tablet Take 1 tablet by mouth every 4 (four) hours as needed for pain. Do not fill until 08/01/2015 150 tablet 0  . oxyCODONE-acetaminophen (PERCOCET) 10-325 MG  per tablet Take 1 tablet by mouth every 4 (four) hours as needed for pain. Do not fill until 09/01/2015 150 tablet 0  . pravastatin (PRAVACHOL) 40 MG tablet Take 1 tablet (40 mg total) by mouth every evening. 30 tablet 2  . zolpidem (AMBIEN CR) 6.25 MG CR tablet Take 1 tablet (6.25 mg total) by mouth at bedtime as needed for sleep. 30 tablet 2  . zoster vaccine live, PF, (ZOSTAVAX) 41937 UNT/0.65ML injection Inject 19,400 Units into the skin once. (Patient not taking: Reported on 01/30/2015) 1 each 0   No current facility-administered medications for this visit.   Family History  Problem Relation Age of Onset  . Stroke Mother   . Coronary artery disease Father   . Coronary artery disease Paternal Uncle   . Coronary artery disease Paternal Aunt    History   Social History  . Marital Status: Married    Spouse Name: N/A  . Number of Children: 1  . Years of Education: N/A   Occupational History  . DISABILITY    Social History Main Topics  . Smoking status: Current Every Day Smoker -- 0.50 packs/day for 40 years    Types: Cigarettes    Last Attempt to Quit: 08/09/2008  . Smokeless tobacco: Never Used  . Alcohol Use: No  . Drug Use: No  . Sexual Activity:  Not on file   Other Topics Concern  . None   Social History Narrative   Married, disabled, medicaid, functions independently at home, paraplegic      Family hx of : hypertension, diabetes, kidney disease, and other cancer   Review of Systems: Review of Systems  Constitutional: Negative for fever and chills.  Respiratory: Negative for cough and shortness of breath.   Cardiovascular: Negative for chest pain.  Gastrointestinal: Negative for abdominal pain, diarrhea and constipation.  Genitourinary: Negative for dysuria.  Skin: Negative for rash.   Objective:  Physical Exam: Filed Vitals:   07/11/15 1416  BP: 131/77  Pulse: 76  Temp: 98.3 F (36.8 C)  TempSrc: Oral  SpO2: 99%   Physical Exam  Constitutional: He is  oriented to person, place, and time. He appears well-developed and well-nourished. No distress.  Sitting in wheelchair  Cardiovascular: Normal rate, regular rhythm, normal heart sounds and intact distal pulses. Exam reveals no gallop and no friction rub.  No murmur heard. Pulmonary/Chest: Effort normal and breath sounds normal. No respiratory distress. He has no wheezes.  Abdominal: Soft. Bowel sounds are normal. He exhibits no distension. There is no tenderness.  Neurological: He is alert and oriented to person, place, and time.  Skin: Warm, dry.  Vitals reviewed.  Assessment & Plan:  Case discussed with Dr. Dareen Piano.  Please see problem based charting assessment and plan note for further details of medical issues addressed at today's visit

## 2015-07-11 NOTE — Assessment & Plan Note (Signed)
BP Readings from Last 3 Encounters:  07/11/15 131/77  04/20/15 143/85  03/22/15 164/93    Lab Results  Component Value Date   NA 133* 03/22/2015   K 3.5 03/22/2015   CREATININE 0.58 03/22/2015    Assessment: Blood pressure control:  at goal Progress toward BP goal:   at gaol Comments: Patient reports that he takes his Lisinopril and HCTZ daily but only takes the amlodipine 1-2 times a weeks because it causes upset stomach  Plan: Medications: Continue Lisinopril 40 mg daily and HCTZ 25 mg daily. Will stop amlodipine today as patient is at goal while not taking amlodipine but 1-2 times a week.  Educational resources provided:   Self management tools provided:   Other plans: Will recheck in 6-8 weeks

## 2015-07-11 NOTE — Assessment & Plan Note (Signed)
Continues to smoke 1/2 PPD. Reports he is trying to quit although no progress has been made since last note in November. He declined any resources to help him quit today.

## 2015-07-11 NOTE — Assessment & Plan Note (Signed)
Patient reports he has never filled the Zostavax prescription because insurance will not cover it and it is $200+. Wife was with him today and requested another prescription and they plan on trying to have it filled at [facility] where she has heard it is less expensive. Gave paper script.

## 2015-07-11 NOTE — Assessment & Plan Note (Signed)
Patient reports symptoms are well controlled on Prevacid 30 mg daily. Will continue current medications.

## 2015-07-11 NOTE — Assessment & Plan Note (Signed)
Reports that he is still having issues sleeping at night. Reports he only uses ambien 1-2 times a week when he can not get any sleep.  - refilled prescription for ambien cr 6.25 qhs prn

## 2015-07-12 LAB — HEPATIC FUNCTION PANEL
ALK PHOS: 83 IU/L (ref 39–117)
ALT: 20 IU/L (ref 0–44)
AST: 16 IU/L (ref 0–40)
Albumin: 4.2 g/dL (ref 3.6–4.8)
Bilirubin Total: 0.5 mg/dL (ref 0.0–1.2)
Bilirubin, Direct: 0.11 mg/dL (ref 0.00–0.40)
Total Protein: 6.3 g/dL (ref 6.0–8.5)

## 2015-07-12 LAB — LIPID PANEL
Chol/HDL Ratio: 5.5 ratio units — ABNORMAL HIGH (ref 0.0–5.0)
Cholesterol, Total: 133 mg/dL (ref 100–199)
HDL: 24 mg/dL — ABNORMAL LOW (ref >39)
LDL CALC: 66 mg/dL (ref 0–99)
Triglycerides: 216 mg/dL — ABNORMAL HIGH (ref 0–149)
VLDL Cholesterol Cal: 43 mg/dL — ABNORMAL HIGH (ref 5–40)

## 2015-07-12 NOTE — Progress Notes (Signed)
Internal Medicine Clinic Attending  I saw and evaluated the patient.  I personally confirmed the key portions of the history and exam documented by Dr. Boswell and I reviewed pertinent patient test results.  The assessment, diagnosis, and plan were formulated together and I agree with the documentation in the resident's note. 

## 2015-08-30 ENCOUNTER — Other Ambulatory Visit: Payer: Self-pay | Admitting: *Deleted

## 2015-08-30 MED ORDER — PRAVASTATIN SODIUM 40 MG PO TABS: 40.0000 mg | ORAL_TABLET | Freq: Every evening | ORAL | Status: DC

## 2015-08-30 NOTE — Telephone Encounter (Signed)
Refill done this month but pt request 64 cay supply

## 2015-09-06 ENCOUNTER — Ambulatory Visit (INDEPENDENT_AMBULATORY_CARE_PROVIDER_SITE_OTHER): Payer: BLUE CROSS/BLUE SHIELD | Admitting: Internal Medicine

## 2015-09-06 VITALS — BP 141/92 | HR 64 | Temp 98.3°F

## 2015-09-06 DIAGNOSIS — Z720 Tobacco use: Secondary | ICD-10-CM

## 2015-09-06 DIAGNOSIS — F17210 Nicotine dependence, cigarettes, uncomplicated: Secondary | ICD-10-CM | POA: Diagnosis not present

## 2015-09-06 DIAGNOSIS — E785 Hyperlipidemia, unspecified: Secondary | ICD-10-CM | POA: Diagnosis not present

## 2015-09-06 DIAGNOSIS — M25511 Pain in right shoulder: Secondary | ICD-10-CM

## 2015-09-06 DIAGNOSIS — M25512 Pain in left shoulder: Secondary | ICD-10-CM

## 2015-09-06 DIAGNOSIS — Z23 Encounter for immunization: Secondary | ICD-10-CM | POA: Diagnosis not present

## 2015-09-06 DIAGNOSIS — Z0000 Encounter for general adult medical examination without abnormal findings: Secondary | ICD-10-CM

## 2015-09-06 DIAGNOSIS — I10 Essential (primary) hypertension: Secondary | ICD-10-CM | POA: Diagnosis not present

## 2015-09-06 MED ORDER — ATORVASTATIN CALCIUM 40 MG PO TABS: 40.0000 mg | ORAL_TABLET | Freq: Every day | ORAL | Status: DC

## 2015-09-06 MED ORDER — HYDROCHLOROTHIAZIDE 25 MG PO TABS: 25.0000 mg | ORAL_TABLET | Freq: Every day | ORAL | Status: DC

## 2015-09-06 MED ORDER — LANSOPRAZOLE 30 MG PO CPDR: DELAYED_RELEASE_CAPSULE | ORAL | Status: DC

## 2015-09-06 MED ORDER — LISINOPRIL 40 MG PO TABS: ORAL_TABLET | ORAL | Status: DC

## 2015-09-06 NOTE — Patient Instructions (Signed)
Thank you for coming in today.   - I am stopping your Pravastatin today. We will try a different medication called Atorvastatin instead. If you have any problems with the new medication then please stop taking it and call us.  - I am sending you in a referral to Sports Medicine. We will call you with the appointment.  - Please continue to work on quitting smoking. - Please come back to clinic in early November.

## 2015-09-07 DIAGNOSIS — M25511 Pain in right shoulder: Secondary | ICD-10-CM | POA: Insufficient documentation

## 2015-09-07 DIAGNOSIS — M25512 Pain in left shoulder: Secondary | ICD-10-CM

## 2015-09-07 LAB — HEPATIC FUNCTION PANEL
ALT: 21 IU/L (ref 0–44)
AST: 14 IU/L (ref 0–40)
Albumin: 4.2 g/dL (ref 3.6–4.8)
Alkaline Phosphatase: 81 IU/L (ref 39–117)
BILIRUBIN TOTAL: 0.4 mg/dL (ref 0.0–1.2)
BILIRUBIN, DIRECT: 0.12 mg/dL (ref 0.00–0.40)
TOTAL PROTEIN: 6.6 g/dL (ref 6.0–8.5)

## 2015-09-07 LAB — LIPID PANEL
CHOL/HDL RATIO: 5.5 ratio — AB (ref 0.0–5.0)
Cholesterol, Total: 143 mg/dL (ref 100–199)
HDL: 26 mg/dL — ABNORMAL LOW (ref >39)
LDL CALC: 66 mg/dL (ref 0–99)
TRIGLYCERIDES: 255 mg/dL — AB (ref 0–149)
VLDL Cholesterol Cal: 51 mg/dL — ABNORMAL HIGH (ref 5–40)

## 2015-09-07 NOTE — Assessment & Plan Note (Signed)
  Assessment: Progress toward smoking cessation:   none Barriers to progress toward smoking cessation:   cravings Comments: He reports he is still smoking ~1/2 PPD. No progress towards quitting. He reports he is slowly quitting and is not interested in anything to help quit.  Plan: Instruction/counseling given:  I counseled patient on the dangers of tobacco use, advised patient to stop smoking, and reviewed strategies to maximize success. Educational resources provided:    Self management tools provided:    Medications to assist with smoking cessation:  None Patient agreed to the following self-care plans for smoking cessation:    Other plans: continue to assess in the future

## 2015-09-07 NOTE — Assessment & Plan Note (Signed)
Patient has been compliant with Pravastain since last visit though has had diarrhea since he started the medication. He wishes to change to something else. Will try Atorvastain today. Told him if he has any problems with the medication to call us.   LDL and LFTs unchanged today since starting the pravastatin. He reports he continued to take it for more than 6 weeks despite the side effects. Questionable if he took it as long as he is reporting. Will get repeat LFTs and Lipid panel at next visit.

## 2015-09-07 NOTE — Assessment & Plan Note (Signed)
Patient received flu vaccine today. 

## 2015-09-07 NOTE — Assessment & Plan Note (Signed)
BP Readings from Last 3 Encounters:  09/06/15 141/92  07/11/15 131/77  04/20/15 143/85    Lab Results  Component Value Date   NA 133* 03/22/2015   K 3.5 03/22/2015   CREATININE 0.58 03/22/2015    Assessment: Blood pressure control:  at goal Progress toward BP goal:   at goal Comments: He is on Lisinopril and HCTZ. BP initially elevated at 152/76 today but improved to 141/92 on recheck. He reports his SBP runs 130-140 when he check at home.  Plan: Medications:  continue current medications Educational resources provided:   Self management tools provided:   Other plans: None

## 2015-09-07 NOTE — Progress Notes (Signed)
Patient ID: Shawn [name]., male   DOB: November 17, 1951, 64 y.o.   MRN: [id number]   Subjective:   Patient ID: Shawn [name]. male   DOB: Jun 08, 1951 64 y.o.   MRN: [id number]  HPI: [name] is a 64 y.o. male with a PMH listed below here today for follow up of his HLD and HTN.   He was started on pravastatin at last visit. He reports that he has been taking it as prescribed but has been having diarrhea as a side effect. He continued to take it despite the diarrhea but requests to be changed to a different medication today.   He reports he checks his BP at home and it is usually in the 825-053 range systolic at home.   Past Medical History  Diagnosis Date  . Substance abuse     alcohol  . Hyperlipidemia   . Hypertension   . GERD (gastroesophageal reflux disease)   . Allergy   . Paraplegia     secondary to [place]  . C. difficile colitis 11/2008  . Decubitus ulcer 1999    excision of right ischial pressure sore with flap reconstruction done by Dr. Denese Killings 10/31/2008  . Perineal abscess    Current Outpatient Prescriptions  Medication Sig Dispense Refill  . atorvastatin (LIPITOR) 40 MG tablet Take 1 tablet (40 mg total) by mouth daily. 30 tablet 2  . buPROPion (WELLBUTRIN SR) 150 MG 12 hr tablet Take 1 tablet (150 mg total) by mouth 2 (two) times daily. 60 tablet 5  . hydrochlorothiazide (HYDRODIURIL) 25 MG tablet Take 1 tablet (25 mg total) by mouth daily. 90 tablet 3  . lansoprazole (PREVACID) 30 MG capsule TAKE 1 CAPSULE (30 MG TOTAL) BY MOUTH DAILY. 90 capsule 3  . lisinopril (PRINIVIL,ZESTRIL) 40 MG tablet TAKE 1 TABLET (40 MG TOTAL) BY MOUTH DAILY. 90 tablet 3  . Multiple Vitamin (MULTIVITAMIN WITH MINERALS) TABS Take 1 tablet by mouth daily.    Marland Kitchen oxyCODONE-acetaminophen (PERCOCET) 10-325 MG per tablet Take 1 tablet by mouth every 4 (four) hours as needed for pain. Do not fill until 10/01/2015 150 tablet 0  . oxyCODONE-acetaminophen (PERCOCET) 10-325 MG per tablet Take  1 tablet by mouth every 4 (four) hours as needed for pain. Do not fill until 08/01/2015 150 tablet 0  . oxyCODONE-acetaminophen (PERCOCET) 10-325 MG per tablet Take 1 tablet by mouth every 4 (four) hours as needed for pain. Do not fill until 09/01/2015 150 tablet 0  . zolpidem (AMBIEN CR) 6.25 MG CR tablet Take 1 tablet (6.25 mg total) by mouth at bedtime as needed for sleep. 30 tablet 2  . zoster vaccine live, PF, (ZOSTAVAX) 97673 UNT/0.65ML injection Inject 19,400 Units into the skin once. (Patient not taking: Reported on 01/30/2015) 1 each 0   No current facility-administered medications for this visit.   Family History  Problem Relation Age of Onset  . Stroke Mother   . Coronary artery disease Father   . Coronary artery disease Paternal Uncle   . Coronary artery disease Paternal Aunt    Social History   Social History  . Marital Status: Married    Spouse Name: N/A  . Number of Children: 1  . Years of Education: N/A   Occupational History  . DISABILITY    Social History Main Topics  . Smoking status: Current Every Day Smoker -- 0.50 packs/day for 40 years    Types: Cigarettes    Last Attempt to Quit: 08/09/2008  .  Smokeless tobacco: Never Used  . Alcohol Use: No  . Drug Use: No  . Sexual Activity: Not on file   Other Topics Concern  . Not on file   Social History Narrative   Married, disabled, medicaid, functions independently at home, paraplegic      Family hx of : hypertension, diabetes, kidney disease, and other cancer   Review of Systems: Review of Systems  Constitutional: Negative for fever and chills.  Respiratory: Negative for shortness of breath.   Cardiovascular: Negative for chest pain.  Gastrointestinal: Positive for diarrhea. Negative for nausea, vomiting and abdominal pain.  Genitourinary: Negative for dysuria.   Objective:  Physical Exam: Filed Vitals:   09/06/15 1509 09/06/15 1622  BP: 152/76 141/92  Pulse: 74 64  Temp: 98.3 F (36.8 C)     TempSrc: Oral   SpO2: 98%    Physical Exam  Constitutional: He is oriented to person, place, and time. He appears well-developed and well-nourished. No distress.  Sitting in wheelchair  Cardiovascular: Normal rate, regular rhythm, normal heart sounds and intact distal pulses. Exam reveals no gallop and no friction rub.  No murmur heard. Pulmonary/Chest: Effort normal and breath sounds normal. No respiratory distress. He has no wheezes.  Abdominal: Soft. Bowel sounds are normal. He exhibits no distension. There is no tenderness.  Neurological: He is alert and oriented to person, place, and time.  MSK: Patient has limited range of motion in his arms bilaterally. Cannot raise his arms above 90 degrees. Motion limited by pain.  Skin: Warm, dry.   Assessment & Plan:   Case discussed with Dr. Dareen Piano. Please refer to Problem based carting for further details of today's visit.

## 2015-09-07 NOTE — Assessment & Plan Note (Signed)
Patient reports 1 month history of bilateral shoulder pain that limits his arm movement. He says he has sharp stabbing pains whenever he tries to lift his arms past 90 degrees. This has progressively gotten worse over the past month. Nothing helps the pain. He reports he has had similar problems in the past that was improved after seeing sports medicine and having shoulder injections.   Will refer to sports medicine today.

## 2015-09-10 NOTE — Progress Notes (Signed)
Internal Medicine Clinic Attending  I saw and evaluated the patient.  I personally confirmed the key portions of the history and exam documented by Dr. Boswell and I reviewed pertinent patient test results.  The assessment, diagnosis, and plan were formulated together and I agree with the documentation in the resident's note. 

## 2015-09-25 ENCOUNTER — Ambulatory Visit (INDEPENDENT_AMBULATORY_CARE_PROVIDER_SITE_OTHER): Payer: BLUE CROSS/BLUE SHIELD | Admitting: Sports Medicine

## 2015-09-25 ENCOUNTER — Encounter: Payer: Self-pay | Admitting: Sports Medicine

## 2015-09-25 VITALS — BP 139/82 | HR 79 | Ht 73.0 in | Wt 180.0 lb

## 2015-09-25 DIAGNOSIS — M25511 Pain in right shoulder: Secondary | ICD-10-CM

## 2015-09-25 DIAGNOSIS — M25512 Pain in left shoulder: Secondary | ICD-10-CM | POA: Diagnosis not present

## 2015-09-25 MED ORDER — METHYLPREDNISOLONE ACETATE 40 MG/ML IJ SUSP
40.0000 mg | Freq: Once | INTRAMUSCULAR | Status: AC
  Administered 2015-09-25: 40 mg via INTRA_ARTICULAR

## 2015-09-25 NOTE — Progress Notes (Signed)
   Subjective:    Patient ID: Shawn [name]., male    DOB: 28-Apr-1951, 64 y.o.   MRN: [id number]  HPI chief complaint: Bilateral shoulder pain  65 year old gentleman comes in today complaining of 6 months of worsening bilateral shoulder pain and decreased mobility. He denies any trauma. Pain is diffuse in the shoulder. At times will radiate down the arm but he denies numbness or tingling. Symptoms are most noticeable with trying to reach overhead, away from his body, or around behind his back. He has had problems with this shoulder in the past and received cortisone injections with good symptom relief. He has not had any recent imaging. He is here today with his wife.  Interim medical history reviewed. History significant for paraplegia and chronic pain syndrome Medications reviewed Allergies reviewed    Review of Systems    as above Objective:   Physical Exam Well-developed, well-nourished. No acute distress. Sitting comfortably in a wheelchair  Examination of each shoulder shows limited active range of motion in all planes. I am able to get much better passive range of motion including full external rotation bilaterally. He has global strength weakness in both upper extremities including with rotator cuff stressing. No tenderness to palpation along the bicipital groove or over the biceps tendon. Neurovascularly intact distally.       Assessment & Plan:  Bilateral shoulder pain secondary to rotator cuff tendinopathy versus possible attritional rotator cuff tears Paraplegia  Each of the patient's shoulders are injected today with a subacromial cortisone injection. He tolerates this without difficulty. Follow-up with me in 4 weeks if symptoms persist. If that is the case I will start with getting some plain x-rays of his shoulder.  Consent obtained and verified. Time-out conducted. Noted no overlying erythema, induration, or other signs of local infection. Skin prepped in a  sterile fashion. Topical analgesic spray: Ethyl chloride. Joint: right shoulder (subacromial) Needle: 25g 1.5 inch Completed without difficulty. Meds: 3cc 1% xylocaine, 1cc (40mg ) depomedrol  Advised to call if fevers/chills, erythema, induration, drainage, or persistent bleeding.  Consent obtained and verified. Time-out conducted. Noted no overlying erythema, induration, or other signs of local infection. Skin prepped in a sterile fashion. Topical analgesic spray: Ethyl chloride. Joint: left shoulder (subacromial) Needle: 25g 1.5 inch Completed without difficulty. Meds: 3cc 1% xylocaine, 1cc (40mg ) depomedrol  Advised to call if fevers/chills, erythema, induration, drainage, or persistent bleeding.

## 2015-10-10 ENCOUNTER — Other Ambulatory Visit: Payer: Self-pay | Admitting: Internal Medicine

## 2015-10-11 ENCOUNTER — Encounter: Payer: Self-pay | Admitting: Internal Medicine

## 2015-10-11 ENCOUNTER — Ambulatory Visit (INDEPENDENT_AMBULATORY_CARE_PROVIDER_SITE_OTHER): Payer: BLUE CROSS/BLUE SHIELD | Admitting: Internal Medicine

## 2015-10-11 VITALS — BP 130/80 | HR 71 | Temp 98.1°F

## 2015-10-11 DIAGNOSIS — M25512 Pain in left shoulder: Secondary | ICD-10-CM

## 2015-10-11 DIAGNOSIS — Z0000 Encounter for general adult medical examination without abnormal findings: Secondary | ICD-10-CM

## 2015-10-11 DIAGNOSIS — E785 Hyperlipidemia, unspecified: Secondary | ICD-10-CM | POA: Diagnosis not present

## 2015-10-11 DIAGNOSIS — Z23 Encounter for immunization: Secondary | ICD-10-CM

## 2015-10-11 DIAGNOSIS — G894 Chronic pain syndrome: Secondary | ICD-10-CM

## 2015-10-11 DIAGNOSIS — M25511 Pain in right shoulder: Secondary | ICD-10-CM

## 2015-10-11 DIAGNOSIS — Z720 Tobacco use: Secondary | ICD-10-CM

## 2015-10-11 DIAGNOSIS — R631 Polydipsia: Secondary | ICD-10-CM

## 2015-10-11 DIAGNOSIS — I10 Essential (primary) hypertension: Secondary | ICD-10-CM

## 2015-10-11 DIAGNOSIS — F17210 Nicotine dependence, cigarettes, uncomplicated: Secondary | ICD-10-CM

## 2015-10-11 LAB — GLUCOSE, CAPILLARY: Glucose-Capillary: 118 mg/dL — ABNORMAL HIGH (ref 65–99)

## 2015-10-11 MED ORDER — MORPHINE SULFATE ER 15 MG PO TBCR: 15.0000 mg | EXTENDED_RELEASE_TABLET | Freq: Two times a day (BID) | ORAL | Status: DC

## 2015-10-11 MED ORDER — OXYCODONE HCL 15 MG PO TABS: 15.0000 mg | ORAL_TABLET | ORAL | Status: DC | PRN
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
## 2015-10-11 NOTE — Patient Instructions (Signed)
Thank you for coming in today  I am giving you a new prescription for MS Contin. Take this twice a day. Use the percocet as needed for breakthrough pain.  Stop taking the atorvastatin. I would like to see you back in 1 month for a recheck.

## 2015-10-11 NOTE — Progress Notes (Signed)
Patient ID: [name], male   DOB: 1951-08-11, 64 y.o.   MRN: [id number]   Subjective:   Patient ID: [name] male   DOB: [birth year] 28 y.o.   MRN: [id number]  HPI: Mr.Sencere L Montoya Brandel. is a 64 y.o. male with a past medical history listed below here today for follow up of his HLD and chronic pain.   For details of today's visit and the status of his chronic medical issues please refer to the assessment and plan.  Past Medical History  Diagnosis Date  . Substance abuse     alcohol  . Hyperlipidemia   . Hypertension   . GERD (gastroesophageal reflux disease)   . Allergy   . Paraplegia ([place])     secondary to [place]  . C. difficile colitis 11/2008  . Decubitus ulcer 1999    excision of right ischial pressure sore with flap reconstruction done by Dr. Denese Killings 10/31/2008  . Perineal abscess    Current Outpatient Prescriptions  Medication Sig Dispense Refill  . buPROPion (WELLBUTRIN SR) 150 MG 12 hr tablet Take 1 tablet (150 mg total) by mouth 2 (two) times daily. 60 tablet 5  . hydrochlorothiazide (HYDRODIURIL) 25 MG tablet Take 1 tablet (25 mg total) by mouth daily. 90 tablet 3  . lansoprazole (PREVACID) 30 MG capsule TAKE 1 CAPSULE (30 MG TOTAL) BY MOUTH DAILY. 90 capsule 3  . lisinopril (PRINIVIL,ZESTRIL) 40 MG tablet TAKE 1 TABLET (40 MG TOTAL) BY MOUTH DAILY. 90 tablet 3  . morphine (MS CONTIN) 15 MG 12 hr tablet Take 1 tablet (15 mg total) by mouth every 12 (twelve) hours. 60 tablet 0  . Multiple Vitamin (MULTIVITAMIN WITH MINERALS) TABS Take 1 tablet by mouth daily.    Marland Kitchen oxyCODONE (ROXICODONE) 15 MG immediate release tablet Take 1 tablet (15 mg total) by mouth every 4 (four) hours as needed for pain. 30 tablet 0  . oxyCODONE-acetaminophen (PERCOCET) 10-325 MG per tablet Take 1 tablet by mouth every 4 (four) hours as needed for pain. Do not fill until 10/01/2015 150 tablet 0  . zolpidem (AMBIEN CR) 6.25 MG CR tablet Take 1 tablet (6.25 mg total) by mouth at bedtime as  needed for sleep. 30 tablet 2  . zoster vaccine live, PF, (ZOSTAVAX) 48016 UNT/0.65ML injection Inject 19,400 Units into the skin once. (Patient not taking: Reported on 01/30/2015) 1 each 0   No current facility-administered medications for this visit.   Family History  Problem Relation Age of Onset  . Stroke Mother   . Coronary artery disease Father   . Coronary artery disease Paternal Uncle   . Coronary artery disease Paternal Aunt    Social History   Social History  . Marital Status: Married    Spouse Name: N/A  . Number of Children: 1  . Years of Education: N/A   Occupational History  . DISABILITY    Social History Main Topics  . Smoking status: Current Every Day Smoker -- 0.50 packs/day for 40 years    Types: Cigarettes    Last Attempt to Quit: 08/09/2008  . Smokeless tobacco: Never Used  . Alcohol Use: No  . Drug Use: No  . Sexual Activity: Not Asked   Other Topics Concern  . None   Social History Narrative   Married, disabled, medicaid, functions independently at home, paraplegic      Family hx of : hypertension, diabetes, kidney disease, and other cancer   Review of Systems: Review of  Systems  Constitutional: Positive for weight loss. Negative for fever and chills.  Gastrointestinal: Positive for diarrhea. Negative for nausea, vomiting, abdominal pain and blood in stool.  Musculoskeletal: Negative for myalgias.  Skin: Negative for rash.   Objective:  Physical Exam: Filed Vitals:   10/11/15 0847 10/11/15 0931  BP: 172/93 130/80  Pulse: 71   Temp: 98.1 F (36.7 C)   TempSrc: Oral   SpO2: 96%    GENERAL- alert, co-operative, appears as stated age, not in any distress. Sitting in wheelchair. HEENT- Atraumatic, normocephalic, PERRL, EOMI, oral mucosa appears moist CARDIAC- RRR, no murmurs, rubs or gallops. RESP- Moving equal volumes of air, and clear to auscultation bilaterally, no wheezes or crackles. ABDOMEN- Soft, nontender, bowel sounds  present. NEURO- No obvious Cr N abnormality. EXTREMITIES- pulse 2+, symmetric, no pedal edema. SKIN- Warm, dry, No rash or lesion.' MSK-Full range of motion in right shoulder. Left shoulder still limited by pain.  PSYCH- Normal mood and affect, appropriate thought content and speech.  Assessment & Plan:  Case discussed with Dr. Lynnae January. Please refer to Problem based carting for further details of today's visit.

## 2015-10-11 NOTE — Assessment & Plan Note (Signed)
BP Readings from Last 3 Encounters:  10/11/15 130/80  09/25/15 139/82  09/06/15 141/92   Lab Results  Component Value Date   NA 133* 03/22/2015   K 3.5 03/22/2015   CREATININE 0.58 03/22/2015    Assessment: Blood pressure control:  controlled Progress toward BP goal:   at goal  Comments: Shawn [name]'s blood pressure was initially 172/93 on arrival though he had just come down to clinic in his wheelchair under his own power. His repeat BP after resting was 130/80. Reports compliance with is medications. Currently on Lisinopril 40 mg daily and HCTZ 25 mg daily.   Plan: Medications:  continue current medications Educational resources provided:   Self management tools provided:   Other plans: Blood pressure improved after rest. No need for changes today. Will continue to monitor.

## 2015-10-11 NOTE — Assessment & Plan Note (Addendum)
Mr. Delucia reports continued diarrhea as well as decreased appetite and weight loss despite changing from the pravastain to the atorvastatin. He says that he has 3-4 loose bowel movements any time he takes the atorvastain. He reports he is taking it 1-2 times a week because of the diarrhea symptoms. Reports 20-30 lb weight loss since starting the statin 3 months ago. It usually takes 4-5 days for his stools to return to normal after taking the statin.   Will stop the statin today and see if this is truly a medication side effect. Follow up in 1 month for recheck. He does have a ACC/AHA CV Risk of 28.7% so ideally would be on a statin but he may be unable to tolerate.

## 2015-10-11 NOTE — Assessment & Plan Note (Addendum)
Patient received PCV13 today. 

## 2015-10-11 NOTE — Assessment & Plan Note (Addendum)
[name] saw sports med on 10/17. He recieved steroid injections in both shoulders. He reports the pain is resolved in his right shoulder but still has limited range of motion in his left shoulder due to pain. Does report the injection did improve his pain in the left shoulder as well though only minimally. He has follow up visit with sports medicine in 2 weeks. Will continue to monitor.

## 2015-10-12 DIAGNOSIS — R631 Polydipsia: Secondary | ICD-10-CM | POA: Insufficient documentation

## 2015-10-12 NOTE — Assessment & Plan Note (Signed)
Shawn [name] reports that he drinks a lot of water throughout the day 2/2 excessive thirst. He is also concerned that whenever he eats anything sugary he becomes tired and sleepy and will take a nap. He has never had any issues with DM in the past. Fasting glucose this morning is 118. Does not meet DM criteria at this time but fasting CBG is elevated beyond the normal range. Will continue to monitor in the future.

## 2015-10-12 NOTE — Progress Notes (Signed)
Internal Medicine Clinic Attending  I saw and evaluated the patient.  I personally confirmed the key portions of the history and exam documented by Dr. Charlynn Grimes and I reviewed pertinent patient test results.  The assessment, diagnosis, and plan were formulated together and I agree with the documentation in the resident's note. [facility] does not have documented weights to verify weight loss. Colonoscopy in 2008 only showed diverticular dz. If Sxs persist, will need further investigation.

## 2015-10-12 NOTE — Assessment & Plan Note (Signed)
Shawn [name] continues to smoke 1/2 PPD. Still has made no progress in quitting. Says he has wellbutrin at home that he has never tried and reports he is not mentally ready to quit. Discussed the importance of quitting and encouraged him to continue to try to quit.

## 2015-10-25 ENCOUNTER — Encounter: Payer: Self-pay | Admitting: Sports Medicine

## 2015-10-25 ENCOUNTER — Ambulatory Visit
Admission: RE | Admit: 2015-10-25 | Discharge: 2015-10-25 | Disposition: A | Discharge status: Home / Self Care | Payer: BLUE CROSS/BLUE SHIELD | Source: Ambulatory Visit | Attending: Sports Medicine | Admitting: Sports Medicine

## 2015-10-25 ENCOUNTER — Ambulatory Visit (INDEPENDENT_AMBULATORY_CARE_PROVIDER_SITE_OTHER): Payer: BLUE CROSS/BLUE SHIELD | Admitting: Sports Medicine

## 2015-10-25 VITALS — BP 132/85 | Ht 73.0 in | Wt 180.0 lb

## 2015-10-25 DIAGNOSIS — M25512 Pain in left shoulder: Secondary | ICD-10-CM

## 2015-10-25 NOTE — Progress Notes (Signed)
Subjective:    Patient ID: Shawn [name]., male    DOB: Jan 14, 1951, 64 y.o.   MRN: [id number]  HPI  Shawn [name] is a 64 year old paraplegic African-American male presenting for follow-up of bilateral shoulder pain. At his last appointment with sports medicine he underwent bilateral corticosteroid injections of the subacromial spaces for worsening shoulder pain.  Reports that his right shoulder is pain-free now and he has returned to full range of motion.  His left shoulder has improved but he still has significant pain present with most movements, particularly external rotation and abduction and flexion. He reports that his abduction and flexion is limited to approximately 70 each.  He has been using his pain medication he is already prescribed for chronic pain with some relief for the shoulder. His transfers and overhead activities and particular are very painful now in the LEFT shoulder.   PMHx -- reviewed and updated in EMR; relevant portions impacting decision making: PARAPLEGIC status, uses manual wheelchair to move about and transfers self with arms  PSHx -- reviewed and updated in EMR; relevant portions impacting decision making: None  Meds -- reviewed and updated in EMR; relevant portions impacting decision making: None  Allergies, Social Hx -- reviewed and updated in EMR Assessment and Review of Systems 10-point ROS negative other than above     Objective:   Physical Exam  General: Resting comfortably on exam table and in no acute distress   Neurologic: Distal sensory and motor exam in bilateral UPPER extremities is unremarkable. The patient is a paraplegic and has no sensation or movement inferior to the navel.   Cardiopulmonary: 2+ pulses in bilateral distal upper and lower extremity; nonlabored respiration   Psychiatric exam: Normal mood, congruent affect. Appropriate thought content   BILATERAL shoulder exam: The patient has a Popeye deformity of the LEFT  biceps on inspection. He demonstrates active range of motion of the right shoulder without limitation, reaching 110 of flexion and abduction before obvious trapezius assist and 30 of extension, 40 external rotation and 30 internal rotation. With regard to the LEFT shoulder, his flexion and abduction are limited to 70 secondary to pain. His active external rotation is limited to 20 secondary to pain but passive flexion, abduction and external rotation are intact. There is 4/5 strength with empty can bilaterally but more pain on the LEFT. There is 5/5 strength with internal rotation, and shoulder extension bilaterally. 4/5 (secondary to pain) on the LEFT shoulder with abduction and flexion.  The empty can is positive bilaterally, more painful on the left The acromioclavicular cross arm test and acromioclavicular compression positive on the left Negative speeds and Yergeuson's test bilaterally There is a negative O'Brien's bilaterally Neers test and Michel Bickers were negative laterally  Musculoskeletal ultrasound of the LEFT shoulder: Secondary to poor mobility/patient movement the supraspinatus and infraspinatus were not able to be well visualized. The subscapularis was identified with limited visibility but had intact tendon. The biceps tendon not well visualized in the bicipital groove. The left acromioclavicular joint demonstrated a mushroom sign and significant ostial lysis of the joint with spurring present on both the acromium and the clavicular.      Assessment & Plan:   Persistent left shoulder pain likely secondary to rotator cuff tear versus osteoarthritis Resolved right shoulder pain likely secondary to rotator cuff tear  Patient has fairly good passive range of motion. However, I would like to start with getting a plain x-ray of his left shoulder to evaluate for the  degree of glenohumeral osteoarthritis present. I will call him and his wife with those results. Although he is  a poor surgical candidate I may consider referral to Dr. Mardelle Matte for his input given the fact that he is completely wheelchair bound and is completely reliant upon his arms for transportation and transfers.  Addendum: X-rays of the left shoulder show only mild glenohumeral osteoarthritis. He does have a high riding humeral head which is consistent with chronic rotator cuff tear. I would like to refer to Dr. Mardelle Matte for his input.

## 2015-10-26 ENCOUNTER — Other Ambulatory Visit: Payer: Self-pay

## 2015-10-26 NOTE — Telephone Encounter (Signed)
Patient currently not on statin 2/2 being unable to tolerate side effect. Follow up in 2 weeks for recheck.

## 2015-10-30 NOTE — Addendum Note (Signed)
Addended by: Cyd Silence on: 10/30/2015 09:15 AM   Modules accepted: Orders

## 2015-10-30 NOTE — Patient Instructions (Signed)
Dr Marchia Bond Wednesday November 23rd at 10:45am [facility] [place] [address] [phone]

## 2015-11-06 DIAGNOSIS — M25512 Pain in left shoulder: Secondary | ICD-10-CM | POA: Diagnosis not present

## 2015-11-06 DIAGNOSIS — N360 Urethral fistula: Secondary | ICD-10-CM | POA: Diagnosis not present

## 2015-11-08 ENCOUNTER — Encounter: Payer: Self-pay | Admitting: Internal Medicine

## 2015-11-08 ENCOUNTER — Ambulatory Visit (INDEPENDENT_AMBULATORY_CARE_PROVIDER_SITE_OTHER): Payer: BLUE CROSS/BLUE SHIELD | Admitting: Internal Medicine

## 2015-11-08 VITALS — BP 166/99 | HR 70 | Temp 97.8°F

## 2015-11-08 DIAGNOSIS — G894 Chronic pain syndrome: Secondary | ICD-10-CM

## 2015-11-08 DIAGNOSIS — I10 Essential (primary) hypertension: Secondary | ICD-10-CM

## 2015-11-08 DIAGNOSIS — M25511 Pain in right shoulder: Secondary | ICD-10-CM

## 2015-11-08 DIAGNOSIS — E785 Hyperlipidemia, unspecified: Secondary | ICD-10-CM | POA: Diagnosis not present

## 2015-11-08 DIAGNOSIS — M25512 Pain in left shoulder: Secondary | ICD-10-CM

## 2015-11-08 MED ORDER — OXYCODONE-ACETAMINOPHEN 10-325 MG PO TABS: 1.0000 | ORAL_TABLET | ORAL | Status: DC | PRN

## 2015-11-08 MED ORDER — HYDROCHLOROTHIAZIDE 25 MG PO TABS: 25.0000 mg | ORAL_TABLET | Freq: Every day | ORAL | Status: DC

## 2015-11-08 MED ORDER — MORPHINE SULFATE ER 15 MG PO TBCR: 15.0000 mg | EXTENDED_RELEASE_TABLET | Freq: Two times a day (BID) | ORAL | Status: DC

## 2015-11-08 MED ORDER — NALOXONE HCL 0.4 MG/ML IJ SOLN: 0.4000 mg | INTRAMUSCULAR | Status: DC | PRN

## 2015-11-08 NOTE — Progress Notes (Signed)
Patient ID: [name], male   DOB: 1950-12-16, 64 y.o.   MRN: [id number]   Subjective:   Patient ID: [name] male   DOB: 1951/10/28 55 y.o.   MRN: [id number]  HPI: [name] is a 64 y.o. male with a past medical history listed below here today for follow up of his HTN, HLD and chronic pain.   For details of today's visit and the status of his chronic medical issues please refer to the assessment and plan.  Past Medical History  Diagnosis Date  . Substance abuse     alcohol  . Hyperlipidemia   . Hypertension   . GERD (gastroesophageal reflux disease)   . Allergy   . Paraplegia ([place])     secondary to Theresa  . C. difficile colitis 11/2008  . Decubitus ulcer 1999    excision of right ischial pressure sore with flap reconstruction done by Dr. Denese Killings 10/31/2008  . Perineal abscess    Current Outpatient Prescriptions  Medication Sig Dispense Refill  . atorvastatin (LIPITOR) 40 MG tablet TAKE 1 TABLET (40 MG TOTAL) BY MOUTH DAILY.  2  . buPROPion (WELLBUTRIN SR) 150 MG 12 hr tablet Take 1 tablet (150 mg total) by mouth 2 (two) times daily. 60 tablet 5  . hydrochlorothiazide (HYDRODIURIL) 25 MG tablet Take 1 tablet (25 mg total) by mouth daily. 90 tablet 3  . lansoprazole (PREVACID) 30 MG capsule TAKE 1 CAPSULE (30 MG TOTAL) BY MOUTH DAILY. 90 capsule 3  . lisinopril (PRINIVIL,ZESTRIL) 40 MG tablet TAKE 1 TABLET (40 MG TOTAL) BY MOUTH DAILY. 90 tablet 3  . morphine (MS CONTIN) 15 MG 12 hr tablet Take 1 tablet (15 mg total) by mouth every 12 (twelve) hours. 60 tablet 0  . Multiple Vitamin (MULTIVITAMIN WITH MINERALS) TABS Take 1 tablet by mouth daily.    Marland Kitchen oxyCODONE (ROXICODONE) 15 MG immediate release tablet Take 1 tablet (15 mg total) by mouth every 4 (four) hours as needed for pain. 30 tablet 0  . oxyCODONE-acetaminophen (PERCOCET) 10-325 MG per tablet Take 1 tablet by mouth every 4 (four) hours as needed for pain. Do not fill until 10/01/2015 150 tablet 0  .  zolpidem (AMBIEN CR) 6.25 MG CR tablet Take 1 tablet (6.25 mg total) by mouth at bedtime as needed for sleep. 30 tablet 2  . zoster vaccine live, PF, (ZOSTAVAX) 16109 UNT/0.65ML injection Inject 19,400 Units into the skin once. (Patient not taking: Reported on 01/30/2015) 1 each 0   No current facility-administered medications for this visit.   Family History  Problem Relation Age of Onset  . Stroke Mother   . Coronary artery disease Father   . Coronary artery disease Paternal Uncle   . Coronary artery disease Paternal Aunt    Social History   Social History  . Marital Status: Married    Spouse Name: N/A  . Number of Children: 1  . Years of Education: N/A   Occupational History  . DISABILITY    Social History Main Topics  . Smoking status: Current Every Day Smoker -- 0.50 packs/day for 40 years    Types: Cigarettes    Last Attempt to Quit: 08/09/2008  . Smokeless tobacco: Never Used     Comment: 1 PACK 2 DAYS  . Alcohol Use: No  . Drug Use: No  . Sexual Activity: Not Asked   Other Topics Concern  . None   Social History Narrative   Married, disabled, medicaid,  functions independently at home, paraplegic      Family hx of : hypertension, diabetes, kidney disease, and other cancer   Review of Systems: Review of Systems  Constitutional: Negative for malaise/fatigue.  Respiratory: Negative for shortness of breath.   Cardiovascular: Negative for chest pain.  Gastrointestinal: Positive for diarrhea. Negative for nausea, vomiting, abdominal pain, blood in stool and melena.  Genitourinary: Negative for dysuria.  Musculoskeletal: Positive for joint pain.  Neurological: Negative for dizziness and headaches.   Objective:  Physical Exam: Filed Vitals:   11/08/15 1019  BP: 179/91  Pulse: 78  Temp: 97.8 F (36.6 C)  TempSrc: Oral  SpO2: 98%   PHSYICAL EXAM GENERAL- alert, co-operative, appears as stated age, not in any distress. Sitting in wheelchair. HEENT-  Atraumatic, normocephalic CARDIAC- RRR, no murmurs, rubs or gallops. RESP- Moving equal volumes of air, and clear to auscultation bilaterally, no wheezes or crackles. ABDOMEN- Soft, nontender, bowel sounds present. NEURO- No obvious Cr N abnormality. EXTREMITIES- pulse 2+, symmetric, no pedal edema. SKIN- Warm, dry, No rash or lesion.' MSK-Full range of motion in right shoulder. Left shoulder still limited by pain.  PSYCH- Normal mood and affect, appropriate thought content and speech.  Assessment & Plan:   Case discussed with Dr. Dareen Piano. Please refer to Problem based charting for further details of today's visit.

## 2015-11-08 NOTE — Patient Instructions (Addendum)
Thank you for coming in today  - Take the MS Contin twice a day 12 hours apart. Use the percocet as needed for breakthrough pain. Try to limit the use of the Percocet as much as possible.  - I am sending in a prescription for Narcan to the pharmacy. This can be used in an emergency if you develop respiratory distress, excessive sleepiness or other problems from the pain medications. Ask the pharmacist to show you how to use it.  - Continue to work on quitting smoking - Stop taking the Atorvastatin. If the pharmacy tried to refill it tell them it has been stopped. - Follow up in 1 month   Naloxone injection What is this medicine? NALOXONE (nal OX one) is a narcotic blocker. It is used to treat narcotic drug overdose. This medicine may be used for other purposes; ask your health care provider or pharmacist if you have questions. What should I tell my health care provider before I take this medicine? They need to know if you have any of these conditions: -drug abuse or addiction -heart disease -an unusual or allergic reaction to naloxone, other medicines, foods, dyes, or preservatives -pregnant or trying to get pregnantbreast-feeding How should I use this medicine? This medicine is for injection into the outer thigh. It can be injected through clothing if needed. Get emergency medical help right away after giving the first dose of this medicine, even if the person wakes up. You should be familiar with how to recognize the signs and symptoms of a narcotic overdose. Administer according to the printed instructions on the device label or the electronic voice instructions. You should practice using the Trainer injector before this medicine is needed. Talk to your pediatrician regarding the use of this medicine in children. While this drug may be prescribed for children as young as newborn for selected conditions, precautions do apply. For infants less than 1 year of age, pinch the thigh muscle while  administering. Overdosage: If you think you have taken too much of this medicine contact a poison control center or emergency room at once. NOTE: This medicine is only for you. Do not share this medicine with others. What if I miss a dose? This does not apply. What may interact with this medicine? This medicine is only used during an emergency. No interactions are expected during emergency use. This list may not describe all possible interactions. Give your health care provider a list of all the medicines, herbs, non-prescription drugs, or dietary supplements you use. Also tell them if you smoke, drink alcohol, or use illegal drugs. Some items may interact with your medicine. What should I watch for while using this medicine? Keep this medicine ready for use in the case of a narcotic overdose. Make sure that you have the phone number of your doctor or health care professional and local hospital ready. You may need to have additional doses of this medicine. Each injector contains a single dose. Some emergencies may require additional doses. After use, bring the treated person to the nearest hospital or call 911. Make sure the treating health care professional knows that the person has received an injection of this medicine. You will receive additional instructions on what to do during and after use of this medicine before an emergency occurs. What side effects may I notice from receiving this medicine? Side effects that you should report to your doctor or health care professional as soon as possible: -allergic reactions like skin rash, itching or hives, swelling  of the face, lips, or tongue -breathing problems -fast, irregular heartbeat -high blood pressure -pain that was controlled by narcotic pain medicine -seizures Side effects that usually do not require medical attention (report to your doctor or health care professional if they continue or are  bothersome): -anxious -chills -diarrhea -fever -nausea, vomiting -sweating This list may not describe all possible side effects. Call your doctor for medical advice about side effects. You may report side effects to FDA at 1-800-FDA-1088. Where should I keep my medicine? Keep out of the reach of children. Store at room temperature between 15 and 25 degrees C (59 and 77 degrees F). Keep this medicine in its outer case until ready to use. Occasionally check the solution through the viewing window of the injector. The solution should be clear. If it is discolored, cloudy, or contains solid particles, replace it with a new injector. Remember to check the expiration date of this medicine regularly. Throw away any unused medicine after the expiration date. NOTE: This sheet is a summary. It may not cover all possible information. If you have questions about this medicine, talk to your doctor, pharmacist, or health care provider.    2016, Elsevier/Gold Standard. (2014-11-01 11:04:51)

## 2015-11-08 NOTE — Assessment & Plan Note (Signed)
Shawn [name] reports taking the MS Contin once a day at night. He has been taking the oxycodone 5 times a day and reports it is less effective than his previous Percocet.  Average pain over the past week: 7/10 Pain interferes with enjoyment of life: 9/10 Pain interferes with general activity: 7/10 Discussed having Narcan available given his high dose of narcotics.   Plan MS Contin 15 mg bid #60 - stressed taking this every day 12 hours apart Percocet 10-325 mg #150 prn - stressed taking only as needed Will give him Rx for Narcan (wife present today and discussed with her as well) RTC in 1 month for follow up

## 2015-11-08 NOTE — Assessment & Plan Note (Signed)
Shawn [name] stopped the Atorvastatin for 4 days before having his medications refilled and restarting the medication. He has continued to have GI upset with diarrhea.   Plan Will have him stop atorvastatin today. Marked through the pill bottle and will call the pharmacy to cancel the Rx. RTC in 1 month for follow up. If the symptoms are 2/2 statin therapy will consider adding Ezetimibe as an alternative given his high CV risk.

## 2015-11-08 NOTE — Assessment & Plan Note (Signed)
Right shoulder pain has resolved but left shoulder pain and limited ROM persists. He was seen by PT again and referred to ortho for further evaluation. Reports being seen on 11/28. No surgery recommended. Given referral to PT by ortho.

## 2015-11-08 NOTE — Assessment & Plan Note (Signed)
BP Readings from Last 3 Encounters:  11/08/15 166/99  10/25/15 132/85  10/11/15 130/80    Lab Results  Component Value Date   NA 133* 03/22/2015   K 3.5 03/22/2015   CREATININE 0.58 03/22/2015    Assessment: Mr. Dishon's blood pressure is significantly elevated today. It was 179/91 initially and 166/99 on repeat. He reports he has not taking his medications today. He does check his blood pressure at home and say that it usually runs in the [id number] systolic. He is unsure about the diastolic.   Plan: Will continue his current medications of Lisinopril 40 mg daily and HCTZ 25 mg daily. Will provide him with a BP log to take home. RTC in 1 month for recheck.

## 2015-11-09 ENCOUNTER — Encounter (HOSPITAL_COMMUNITY): Payer: Self-pay | Admitting: Occupational Therapy

## 2015-11-09 ENCOUNTER — Ambulatory Visit (HOSPITAL_COMMUNITY): Payer: BLUE CROSS/BLUE SHIELD | Attending: Orthopedic Surgery | Admitting: Occupational Therapy

## 2015-11-09 DIAGNOSIS — X58XXXS Exposure to other specified factors, sequela: Secondary | ICD-10-CM | POA: Insufficient documentation

## 2015-11-09 DIAGNOSIS — M6281 Muscle weakness (generalized): Secondary | ICD-10-CM | POA: Insufficient documentation

## 2015-11-09 DIAGNOSIS — S46002S Unspecified injury of muscle(s) and tendon(s) of the rotator cuff of left shoulder, sequela: Secondary | ICD-10-CM | POA: Insufficient documentation

## 2015-11-09 DIAGNOSIS — Z658 Other specified problems related to psychosocial circumstances: Secondary | ICD-10-CM | POA: Insufficient documentation

## 2015-11-09 DIAGNOSIS — Z789 Other specified health status: Secondary | ICD-10-CM

## 2015-11-09 NOTE — Therapy (Signed)
[facility] [address] [place], [place], [address] Phone: [phone]   Fax:  [phone]  Occupational Therapy Evaluation  Patient Details  Name: Shawn [name]. MRN: [id number] Date of Birth: 12/16/1950 Referring Provider: Dr. Edmonia Lynch  Encounter Date: 11/09/2015      OT End of Session - 11/09/15 1141    Visit Number 1   Number of Visits 12   Date for OT Re-Evaluation 01/08/16  mini reassessment 12/07/15   Authorization Type BCBS    OT Start Time 1056   OT Stop Time 1130   OT Time Calculation (min) 34 min   Activity Tolerance Patient tolerated treatment well   Behavior During Therapy [facility] for tasks assessed/performed      Past Medical History  Diagnosis Date  . Substance abuse     alcohol  . Hyperlipidemia   . Hypertension   . GERD (gastroesophageal reflux disease)   . Allergy   . Paraplegia ([place])     secondary to [place]  . C. difficile colitis 11/2008  . Decubitus ulcer 1999    excision of right ischial pressure sore with flap reconstruction done by Dr. Denese Killings 10/31/2008  . Perineal abscess     Past Surgical History  Procedure Laterality Date  . Spine surgery    . Bone biopsy  11/2007    negative for organisms  . Sacral wound flap  2002    done by Dr. Stephanie Coup    There were no vitals filed for this visit.  Visit Diagnosis:  Muscle weakness of left upper extremity  Decreased independence with activities of daily living      Subjective Assessment - 11/09/15 1131    Subjective  S: I've never had any pain, I just can't lift it.    Pertinent History Pt is a 64 y/o male presenting with left shoulder weakness. Pt reports weakness began in June/July of 2016. The MD gave him a shot approximately 1 month ago, with no improvement. A second shot was attempted this week (11/04/15). Dr. Edmonia Lynch has referred pt to occuapational therapy for evaluation and treatment.    Special Tests FOTO Score: 60/100 (40%  impairment)   Patient Stated Goals To be able to lift my arm.    Currently in Pain? No/denies           [facility] OT Assessment - 11/09/15 1051    Assessment   Diagnosis Left shoulder weakness  Left RC Arthropathy   Referring Provider Dr. Edmonia Lynch   Onset Date 05/10/15   Prior Therapy None   Precautions   Precautions None   Restrictions   Weight Bearing Restrictions No   Balance Screen   Has the patient fallen in the past 6 months No   Has the patient had a decrease in activity level because of a fear of falling?  No   Is the patient reluctant to leave their home because of a fear of falling?  No   Home  Environment   Family/patient expects to be discharged to: Private residence   Living Arrangements Spouse/significant other   Prior Function   Level of Independence Needs assistance with ADLs   ADL   ADL comments Pt is having difficulty with dressing tasks, reaching into overhead cabinets and refrigerator, transfering from wheelchair to other surfaces/seats, holding onto weighted objects.    Written Expression   Dominant Hand Right   Cognition   Overall Cognitive Status Within Functional Limits for tasks assessed  ROM / Strength   AROM / PROM / Strength AROM;PROM;Strength   Palpation   Palpation comment Min fascial restrictions in upper trapezius region.    AROM   Overall AROM Comments Assessed in sitting , ER/IR adducted   AROM Assessment Site Shoulder   Right/Left Shoulder Left   Left Shoulder Flexion 51 Degrees   Left Shoulder ABduction 80 Degrees   Left Shoulder Internal Rotation 48 Degrees   Left Shoulder External Rotation 55 Degrees   PROM   Overall PROM Comments P/ROM is WNL   PROM Assessment Site Shoulder   Right/Left Shoulder Left   Strength   Overall Strength Comments Assessed seated, ER/IR adducted   Strength Assessment Site Shoulder   Right/Left Shoulder Left   Left Shoulder Flexion 3-/5   Left Shoulder ABduction 3-/5   Left Shoulder Internal  Rotation 4+/5   Left Shoulder External Rotation 4+/5                         OT Education - 11/09/15 1139    Education provided Yes   Education Details table slides; scapular A/ROM seated exercises   Person(s) Educated Patient   Methods Explanation;Demonstration;Handout   Comprehension Verbalized understanding;Returned demonstration          OT Short Term Goals - 11/09/15 1151    OT SHORT TERM GOAL #1   Title Pt will be educated on and independent in HEP.    Time 6   Period Weeks   Status New   OT SHORT TERM GOAL #2   Title Pt will return to prior level of functioning and independence in daily and leisure tasks.    Time 6   Period Weeks   Status New   OT SHORT TERM GOAL #3   Title Pt will increase LUE A/ROM to [facility] to increase ability to complete functional tasks including dressing and meal preparation using LUE.    Time 6   Period Weeks   Status New   OT SHORT TERM GOAL #4   Title Pt will increase LUE strength to 4/5 to increase ability to perform transfer tasks and lift weighted objects using LUE.    Time 6   Period Weeks   Status New                  Plan - 11/09/15 1142    Clinical Impression Statement A: Pt is a 64 y/o male presenting with decreased range of motion and decreased strength limiting his ability to complete daily tasks at his usual level of independence. Pt reports he does not experience any pain, however is unable to lift and move his arm due to weakness. Provided pt with table slides and seated scapular A/ROM exercises for HEP.     Pt will benefit from skilled therapeutic intervention in order to improve on the following deficits (Retired) Decreased strength;Pain;Impaired UE functional use;Decreased range of motion;Impaired flexibility   Rehab Potential Good   OT Frequency 2x / week   OT Duration 6 weeks   OT Treatment/Interventions Self-care/ADL training;Patient/family education;Passive range of motion;Therapeutic  exercises;Manual Therapy;Moist Heat;Therapeutic activities   Plan P: Pt would benefit from skilled occupational therapy services to increase range of motion and strength, and increase functional use of LUE. Treatment plan: P/ROM, AA/ROM, A/ROM, scapular stabilization, proximal shoulder strengthening and stability exercises, general LUE strengthening.    OT Home Exercise Plan table slides, seated scapular A/ROM exercises   Consulted and Agree with Plan of Care  Patient          G-Codes - 11/09/15 1155    Functional Assessment Tool Used FOTO Score: 60/100 (40% impaired)   Functional Limitation Carrying, moving and handling objects   Carrying, Moving and Handling Objects Current Status [phone]) At least 40 percent but less than 60 percent impaired, limited or restricted   Carrying, Moving and Handling Objects Goal Status [id number]) At least 20 percent but less than 40 percent impaired, limited or restricted      Problem List Patient Active Problem List   Diagnosis Date Noted  . Polydipsia 10/12/2015  . Bilateral shoulder pain 09/07/2015  . Cataracts, bilateral 01/30/2015  . Palpitations 07/15/2013  . Tobacco abuse 07/15/2013  . Insomnia, transient 07/15/2013  . Preventative health care 09/29/2012  . CHRONIC PAIN SYNDROME 06/26/2010  . ANEMIA 12/12/2008  . Hyperlipidemia 12/29/2006  . PARAPLEGIA 12/29/2006  . HYPERTENSION, BENIGN ESSENTIAL 12/29/2006  . GERD 12/29/2006    Guadelupe Sabin, OTR/L  [phone]  11/09/2015, 11:57 AM  [facility] [address] [place], [place], [address] Phone: [phone]   Fax:  [phone]  Name: Shawn [name]. MRN: [id number] Date of Birth: [birth year]

## 2015-11-09 NOTE — Patient Instructions (Addendum)
SHOULDER: Flexion On Table   Place hands on table, elbows straight. Move hips away from body. Press hands down into table.  10-15___ reps per set, _2__ sets per day.  Abduction (Passive)   With arm out to side, resting on table, lower head toward arm, keeping trunk away from table. Repeat _10-15___ times. Do __2__ sessions per day.  Copyright  VHI. All rights reserved.     Internal Rotation (Assistive)   Seated with elbow bent at right angle and held against side, slide arm on table surface in an inward arc. Repeat __10-15__ times. Do __2__ sessions per day. Activity: Use this motion to brush crumbs off the table.  Copyright  VHI. All rights reserved.          1) Seated Row   Sit up straight with elbows by your sides. Pull back with shoulders/elbows, keeping forearms straight, as if pulling back on the reins of a horse. Squeeze shoulder blades together. Repeat _10__times, __2__sets/day    2) Shoulder Elevation    Sit up straight with arms by your sides. Slowly bring your shoulders up towards your ears. Repeat_10__times, __2__ sets/day    3) Shoulder Extension    Sit up straight with both arms by your side, draw your arms back behind your waist. Keep your elbows straight. Repeat _10___times, __2_sets/day.

## 2015-11-13 NOTE — Progress Notes (Signed)
Internal Medicine Clinic Attending  I saw and evaluated the patient.  I personally confirmed the key portions of the history and exam documented by Dr. Boswell and I reviewed pertinent patient test results.  The assessment, diagnosis, and plan were formulated together and I agree with the documentation in the resident's note. 

## 2015-11-15 ENCOUNTER — Ambulatory Visit (HOSPITAL_COMMUNITY): Payer: BLUE CROSS/BLUE SHIELD | Admitting: Occupational Therapy

## 2015-11-17 ENCOUNTER — Encounter (HOSPITAL_COMMUNITY): Payer: BLUE CROSS/BLUE SHIELD | Admitting: Occupational Therapy

## 2015-11-21 ENCOUNTER — Encounter (HOSPITAL_COMMUNITY): Payer: Self-pay

## 2015-11-21 ENCOUNTER — Ambulatory Visit (HOSPITAL_COMMUNITY): Payer: BLUE CROSS/BLUE SHIELD

## 2015-11-21 DIAGNOSIS — Z658 Other specified problems related to psychosocial circumstances: Secondary | ICD-10-CM | POA: Diagnosis not present

## 2015-11-21 DIAGNOSIS — S46002S Unspecified injury of muscle(s) and tendon(s) of the rotator cuff of left shoulder, sequela: Secondary | ICD-10-CM | POA: Diagnosis not present

## 2015-11-21 DIAGNOSIS — M6281 Muscle weakness (generalized): Secondary | ICD-10-CM

## 2015-11-21 NOTE — Therapy (Signed)
[facility] [address] [place], [place], [address] Phone: [phone]   Fax:  [phone]  Occupational Therapy Treatment  Patient Details  Name: Shawn [name]. MRN: [id number] Date of Birth: 01/06/1951 Referring Provider: Dr. Edmonia Lynch  Encounter Date: 11/21/2015      OT End of Session - 11/21/15 1217    Visit Number 2   Number of Visits 12   Date for OT Re-Evaluation 01/08/16  mini reassessment 12/07/15   Authorization Type BCBS    OT Start Time 1020   OT Stop Time 1100   OT Time Calculation (min) 40 min   Activity Tolerance Patient tolerated treatment well   Behavior During Therapy [facility] for tasks assessed/performed      Past Medical History  Diagnosis Date  . Substance abuse     alcohol  . Hyperlipidemia   . Hypertension   . GERD (gastroesophageal reflux disease)   . Allergy   . Paraplegia ([place])     secondary to [place]  . C. difficile colitis 11/2008  . Decubitus ulcer 1999    excision of right ischial pressure sore with flap reconstruction done by Dr. Denese Killings 10/31/2008  . Perineal abscess     Past Surgical History  Procedure Laterality Date  . Spine surgery    . Bone biopsy  11/2007    negative for organisms  . Sacral wound flap  2002    done by Dr. Stephanie Coup    There were no vitals filed for this visit.  Visit Diagnosis:  Muscle weakness of left upper extremity      Subjective Assessment - 11/21/15 1036    Subjective  S: My arm is just weak.   Currently in Pain? No/denies            [facility] OT Assessment - 11/21/15 1037    Assessment   Diagnosis Left shoulder weakness   Precautions   Precautions None   Precaution Comments Is unable to lay supine.                   OT Treatments/Exercises (OP) - 11/21/15 1037    Exercises   Exercises Shoulder   Shoulder Exercises: Seated   Elevation AROM;10 reps   Extension AROM;10 reps   Row AROM;10 reps   Protraction AAROM;10 reps   External Rotation AAROM;10 reps   Internal Rotation AAROM;10 reps   Flexion AAROM;10 reps;Limitations   Flexion Limitations Able to achieve 50% range.   Abduction AAROM;10 reps   ABduction Limitations Less than 50% range   Shoulder Exercises: Pulleys   Flexion 1 minute   ABduction 1 minute   Shoulder Exercises: Therapy Ball   Flexion 10 reps   ABduction 10 reps   Manual Therapy   Manual Therapy Myofascial release   Manual therapy comments manual therapy completed prior to exercises.   Myofascial Release Myofascial release completed to trapezius and scapularis region to decrease fascial restrictions and increase joint mobility in a pain free zone.                 OT Education - 11/21/15 1214    Education provided Yes   Education Details Patient was given print out of OT evaluation and reviewed goals.    Person(s) Educated Patient;Spouse   Methods Explanation;Handout   Comprehension Verbalized understanding          OT Short Term Goals - 11/21/15 1220    OT SHORT TERM GOAL #1  Title Pt will be educated on and independent in [place].    Time 6   Period Weeks   Status On-going   OT SHORT TERM GOAL #2   Title Pt will return to prior level of functioning and independence in daily and leisure tasks.    Time 6   Period Weeks   Status On-going   OT SHORT TERM GOAL #3   Title Pt will increase LUE A/ROM to [facility] to increase ability to complete functional tasks including dressing and meal preparation using LUE.    Time 6   Period Weeks   Status On-going   OT SHORT TERM GOAL #4   Title Pt will increase LUE strength to 4/5 to increase ability to perform transfer tasks and lift weighted objects using LUE.    Time 6   Period Weeks   Status On-going                  Plan - 11/21/15 1217    Clinical Impression Statement A: Initiated myofascial release, manual stretches, and AA/ROM exercises. Patient presents with increased shoulder elevation when completing seated  exercises. VC for form and techniques for all exercises.   Plan P: Work on depressing shoulder during AA/ROM exercises.         Problem List Patient Active Problem List   Diagnosis Date Noted  . Polydipsia 10/12/2015  . Bilateral shoulder pain 09/07/2015  . Cataracts, bilateral 01/30/2015  . Palpitations 07/15/2013  . Tobacco abuse 07/15/2013  . Insomnia, transient 07/15/2013  . Preventative health care 09/29/2012  . CHRONIC PAIN SYNDROME 06/26/2010  . ANEMIA 12/12/2008  . Hyperlipidemia 12/29/2006  . PARAPLEGIA 12/29/2006  . HYPERTENSION, BENIGN ESSENTIAL 12/29/2006  . GERD 12/29/2006    Ailene Ravel, OTR/L,CBIS  [phone]  11/21/2015, 12:20 PM  [facility] [address] [place], [place], [address] Phone: [phone]   Fax:  [phone]  Name: Demere Capel. MRN: [id number] Date of Birth: 12-12-1950

## 2015-11-24 ENCOUNTER — Encounter (HOSPITAL_COMMUNITY): Payer: Self-pay | Admitting: Occupational Therapy

## 2015-11-24 ENCOUNTER — Ambulatory Visit (HOSPITAL_COMMUNITY): Payer: BLUE CROSS/BLUE SHIELD | Admitting: Occupational Therapy

## 2015-11-24 DIAGNOSIS — Z789 Other specified health status: Secondary | ICD-10-CM

## 2015-11-24 DIAGNOSIS — S46002S Unspecified injury of muscle(s) and tendon(s) of the rotator cuff of left shoulder, sequela: Secondary | ICD-10-CM | POA: Diagnosis not present

## 2015-11-24 DIAGNOSIS — Z658 Other specified problems related to psychosocial circumstances: Secondary | ICD-10-CM | POA: Diagnosis not present

## 2015-11-24 DIAGNOSIS — M6281 Muscle weakness (generalized): Secondary | ICD-10-CM

## 2015-11-24 NOTE — Therapy (Signed)
[facility] [address] [place], [place], [address] Phone: [phone]   Fax:  [phone]  Occupational Therapy Treatment  Patient Details  Name: Shawn [name]. MRN: [id number] Date of Birth: [birth year] Referring Provider: Dr. Edmonia Lynch  Encounter Date: 11/24/2015      OT End of Session - 11/24/15 1156    Visit Number 3   Number of Visits 12   Date for OT Re-Evaluation 01/08/16  mini reassessment 12/07/15   Authorization Type BCBS    OT Start Time 1024   OT Stop Time 1102   OT Time Calculation (min) 38 min   Activity Tolerance Patient tolerated treatment well   Behavior During Therapy [facility] for tasks assessed/performed      Past Medical History  Diagnosis Date  . Substance abuse     alcohol  . Hyperlipidemia   . Hypertension   . GERD (gastroesophageal reflux disease)   . Allergy   . Paraplegia ([place])     secondary to [place]  . C. difficile colitis 11/2008  . Decubitus ulcer 1999    excision of right ischial pressure sore with flap reconstruction done by Dr. Denese Killings 10/31/2008  . Perineal abscess     Past Surgical History  Procedure Laterality Date  . Spine surgery    . Bone biopsy  11/2007    negative for organisms  . Sacral wound flap  2002    done by Dr. Stephanie Coup    There were no vitals filed for this visit.  Visit Diagnosis:  Muscle weakness of left upper extremity  Decreased independence with activities of daily living      Subjective Assessment - 11/24/15 1024    Subjective  S: It's not painful, I just can't move it much.    Currently in Pain? No/denies            [facility] OT Assessment - 11/24/15 1024    Assessment   Diagnosis Left shoulder weakness   Precautions   Precautions None   Precaution Comments Is unable to lay supine.                   OT Treatments/Exercises (OP) - 11/24/15 1027    Exercises   Exercises Shoulder   Shoulder Exercises: Seated   Elevation AROM;15  reps   Extension AROM;15 reps   Row AROM;15 reps   Protraction PROM;5 reps;AAROM;10 reps   Horizontal ABduction PROM;5 reps   External Rotation PROM;5 reps;AAROM;10 reps   Internal Rotation PROM;5 reps;AAROM;10 reps   Flexion PROM;5 reps;AAROM;10 reps   Flexion Limitations Able to achieve 50% range.   Abduction PROM;5 reps;AAROM;10 reps   ABduction Limitations Less than 50% range   Shoulder Exercises: Pulleys   Flexion 1 minute   ABduction 1 minute   Shoulder Exercises: Therapy Ball   Flexion 15 reps   ABduction 15 reps   Manual Therapy   Manual Therapy Myofascial release   Manual therapy comments manual therapy completed prior to exercises.   Myofascial Release Myofascial release completed to trapezius and scapularis region to decrease fascial restrictions and increase joint mobility in a pain free zone.                   OT Short Term Goals - 11/21/15 1220    OT SHORT TERM GOAL #1   Title Pt will be educated on and independent in HEP.    Time 6   Period Weeks   Status On-going  OT SHORT TERM GOAL #2   Title Pt will return to prior level of functioning and independence in daily and leisure tasks.    Time 6   Period Weeks   Status On-going   OT SHORT TERM GOAL #3   Title Pt will increase LUE A/ROM to [facility] to increase ability to complete functional tasks including dressing and meal preparation using LUE.    Time 6   Period Weeks   Status On-going   OT SHORT TERM GOAL #4   Title Pt will increase LUE strength to 4/5 to increase ability to perform transfer tasks and lift weighted objects using LUE.    Time 6   Period Weeks   Status On-going                  Plan - 11/24/15 1157    Clinical Impression Statement A: Pt arrived late to session. Continued AA/ROM exercises, pulleys this session. Pt continues to elevate shoulder during AA/ROM exercises, namely protraction and flexion. Pt required verbal cuing for form during exercises.    Plan P: Add  horizontal ab/adduction when appropriate. Continue to work on depressing shoulder during exercises, use mirror.         Problem List Patient Active Problem List   Diagnosis Date Noted  . Polydipsia 10/12/2015  . Bilateral shoulder pain 09/07/2015  . Cataracts, bilateral 01/30/2015  . Palpitations 07/15/2013  . Tobacco abuse 07/15/2013  . Insomnia, transient 07/15/2013  . Preventative health care 09/29/2012  . CHRONIC PAIN SYNDROME 06/26/2010  . ANEMIA 12/12/2008  . Hyperlipidemia 12/29/2006  . PARAPLEGIA 12/29/2006  . HYPERTENSION, BENIGN ESSENTIAL 12/29/2006  . GERD 12/29/2006    Guadelupe Sabin, OTR/L  [phone]  11/24/2015, 11:58 AM  [facility] [address] [place], [place], [address] Phone: [phone]   Fax:  [phone]  Name: Shawn [name]. MRN: [id number] Date of Birth: 11-Jan-1951

## 2015-11-28 ENCOUNTER — Ambulatory Visit (HOSPITAL_COMMUNITY): Payer: BLUE CROSS/BLUE SHIELD | Admitting: Specialist

## 2015-11-28 DIAGNOSIS — M6281 Muscle weakness (generalized): Secondary | ICD-10-CM | POA: Diagnosis not present

## 2015-11-28 DIAGNOSIS — S46002S Unspecified injury of muscle(s) and tendon(s) of the rotator cuff of left shoulder, sequela: Secondary | ICD-10-CM

## 2015-11-28 DIAGNOSIS — Z658 Other specified problems related to psychosocial circumstances: Principal | ICD-10-CM

## 2015-11-28 DIAGNOSIS — Z789 Other specified health status: Secondary | ICD-10-CM

## 2015-11-28 NOTE — Therapy (Signed)
[facility] [address] [place], [place], [address] Phone: [phone]   Fax:  [phone]  Occupational Therapy Treatment  Patient Details  Name: Shawn [name]. MRN: [id number] Date of Birth: 11/09/1951 Referring Provider: Dr. Edmonia Lynch  Encounter Date: 11/28/2015      OT End of Session - 11/28/15 1708    Visit Number 4   Number of Visits 12   Date for OT Re-Evaluation 01/08/16  mini reassess 12/07/15   Authorization Type BCBS    OT Start Time 1523   OT Stop Time 1601   OT Time Calculation (min) 38 min   Activity Tolerance Patient tolerated treatment well   Behavior During Therapy [facility] for tasks assessed/performed      Past Medical History  Diagnosis Date  . Substance abuse     alcohol  . Hyperlipidemia   . Hypertension   . GERD (gastroesophageal reflux disease)   . Allergy   . Paraplegia ([place])     secondary to [place]  . C. difficile colitis 11/2008  . Decubitus ulcer 1999    excision of right ischial pressure sore with flap reconstruction done by Dr. Denese Killings 10/31/2008  . Perineal abscess     Past Surgical History  Procedure Laterality Date  . Spine surgery    . Bone biopsy  11/2007    negative for organisms  . Sacral wound flap  2002    done by Dr. Stephanie Coup    There were no vitals filed for this visit.  Visit Diagnosis:  Decreased independence with activities of daily living  Muscle weakness of left upper extremity  Rotator cuff injury, left, sequela      Subjective Assessment - 11/28/15 1523    Subjective  S:  Its not painful, I just cant lift it.   Currently in Pain? No/denies            [facility] OT Assessment - 11/28/15 0001    Assessment   Diagnosis Left shoulder weakness   Precautions   Precautions None   Precaution Comments Is unable to lay supine.                   OT Treatments/Exercises (OP) - 11/28/15 0001    Exercises   Exercises Shoulder   Shoulder Exercises:  Seated   Protraction PROM;5 reps;AAROM;10 reps   Horizontal ABduction PROM;5 reps;AAROM;10 reps   External Rotation PROM;5 reps;AAROM;10 reps   Internal Rotation PROM;5 reps;AAROM;10 reps   Flexion PROM;5 reps;AAROM;15 reps   Abduction PROM;5 reps;AAROM;15 reps   Shoulder Exercises: Pulleys   Flexion 2 minutes   ABduction 2 minutes   Shoulder Exercises: Therapy Ball   Flexion 20 reps   ABduction 20 reps   Shoulder Exercises: ROM/Strengthening   Other ROM/Strengthening Exercises box and block max difficulty 4'51"   Manual Therapy   Manual Therapy Myofascial release   Manual therapy comments manual therapy completed prior to exercises.   Myofascial Release Myofascial release completed to trapezius and scapularis region to decrease fascial restrictions and increase joint mobility in a pain free zone.                   OT Short Term Goals - 11/21/15 1220    OT SHORT TERM GOAL #1   Title Pt will be educated on and independent in HEP.    Time 6   Period Weeks   Status On-going   OT SHORT TERM GOAL #2   Title  Pt will return to prior level of functioning and independence in daily and leisure tasks.    Time 6   Period Weeks   Status On-going   OT SHORT TERM GOAL #3   Title Pt will increase LUE A/ROM to [facility] to increase ability to complete functional tasks including dressing and meal preparation using LUE.    Time 6   Period Weeks   Status On-going   OT SHORT TERM GOAL #4   Title Pt will increase LUE strength to 4/5 to increase ability to perform transfer tasks and lift weighted objects using LUE.    Time 6   Period Weeks   Status On-going                  Plan - 11/28/15 1709    Clinical Impression Statement A:  In order to complete AA/ROM therapist unweighted LUE and provided max tactile cues and verbal cues to depress shoulder blade.  Box and block max difficulty for patient    Plan P:  use mirror for self correction of shoulder elevation during therapeutic  exercises.  complete box and block 1 time per week to guage progress of active movement and functional use of LUE with functional activities.         Problem List Patient Active Problem List   Diagnosis Date Noted  . Polydipsia 10/12/2015  . Bilateral shoulder pain 09/07/2015  . Cataracts, bilateral 01/30/2015  . Palpitations 07/15/2013  . Tobacco abuse 07/15/2013  . Insomnia, transient 07/15/2013  . Preventative health care 09/29/2012  . CHRONIC PAIN SYNDROME 06/26/2010  . ANEMIA 12/12/2008  . Hyperlipidemia 12/29/2006  . PARAPLEGIA 12/29/2006  . HYPERTENSION, BENIGN ESSENTIAL 12/29/2006  . GERD 12/29/2006    Vangie Bicker, OTR/L [phone]  11/28/2015, 5:12 PM  [facility] [address] [place], [place], [address] Phone: [phone]   Fax:  [phone]  Name: Shawn [name]. MRN: [id number] Date of Birth: [birth year]

## 2015-11-29 ENCOUNTER — Other Ambulatory Visit: Payer: Self-pay | Admitting: Internal Medicine

## 2015-11-30 ENCOUNTER — Encounter (HOSPITAL_COMMUNITY): Payer: Self-pay

## 2015-11-30 ENCOUNTER — Ambulatory Visit (HOSPITAL_COMMUNITY): Payer: BLUE CROSS/BLUE SHIELD

## 2015-11-30 DIAGNOSIS — M6281 Muscle weakness (generalized): Secondary | ICD-10-CM

## 2015-11-30 DIAGNOSIS — Z658 Other specified problems related to psychosocial circumstances: Secondary | ICD-10-CM | POA: Diagnosis not present

## 2015-11-30 DIAGNOSIS — S46002S Unspecified injury of muscle(s) and tendon(s) of the rotator cuff of left shoulder, sequela: Secondary | ICD-10-CM | POA: Diagnosis not present

## 2015-11-30 NOTE — Therapy (Signed)
[facility] [address] [place], [place], [address] Phone: [phone]   Fax:  [phone]  Occupational Therapy Treatment  Patient Details  Name: Shawn [name]. MRN: [id number] Date of Birth: 02-28-1951 Referring Provider: Dr. Edmonia Lynch  Encounter Date: 11/30/2015      OT End of Session - 11/30/15 1122    Visit Number 5   Number of Visits 12   Date for OT Re-Evaluation 01/08/16  mini reassess 12/07/15   Authorization Type BCBS    OT Start Time 1020   OT Stop Time 1100   OT Time Calculation (min) 40 min   Activity Tolerance Patient tolerated treatment well   Behavior During Therapy [facility] for tasks assessed/performed      Past Medical History  Diagnosis Date  . Substance abuse     alcohol  . Hyperlipidemia   . Hypertension   . GERD (gastroesophageal reflux disease)   . Allergy   . Paraplegia ([place])     secondary to [place]  . C. difficile colitis 11/2008  . Decubitus ulcer 1999    excision of right ischial pressure sore with flap reconstruction done by Dr. Denese Killings 10/31/2008  . Perineal abscess     Past Surgical History  Procedure Laterality Date  . Spine surgery    . Bone biopsy  11/2007    negative for organisms  . Sacral wound flap  2002    done by Dr. Stephanie Coup    There were no vitals filed for this visit.  Visit Diagnosis:  Muscle weakness of left upper extremity      Subjective Assessment - 11/30/15 1040    Subjective  S: I've been working on my arm at home.    Currently in Pain? No/denies            [facility] OT Assessment - 11/30/15 1041    Assessment   Diagnosis Left shoulder weakness   Precautions   Precautions None   Precaution Comments Is unable to lay supine.                   OT Treatments/Exercises (OP) - 11/30/15 1041    Exercises   Exercises Shoulder   Shoulder Exercises: Seated   Protraction PROM;5 reps;AAROM;10 reps   Horizontal ABduction PROM;5 reps;AAROM;10 reps    External Rotation PROM;5 reps;AAROM;10 reps   Internal Rotation PROM;5 reps;AAROM;10 reps   Flexion PROM;5 reps;AAROM;15 reps   Flexion Limitations Able to achieve 50% range.   Abduction PROM;5 reps;AAROM;15 reps   Shoulder Exercises: Pulleys   Flexion 2 minutes   ABduction 2 minutes   Shoulder Exercises: ROM/Strengthening   UBE (Upper Arm Bike) Level 1 2' reverse 2' forward  Focusing on using Left UE more than right.    Manual Therapy   Manual Therapy Myofascial release   Manual therapy comments manual therapy completed prior to exercises.   Myofascial Release Myofascial release completed to trapezius and scapularis region to decrease fascial restrictions and increase joint mobility in a pain free zone.                   OT Short Term Goals - 11/21/15 1220    OT SHORT TERM GOAL #1   Title Pt will be educated on and independent in HEP.    Time 6   Period Weeks   Status On-going   OT SHORT TERM GOAL #2   Title Pt will return to prior level of functioning and independence  in daily and leisure tasks.    Time 6   Period Weeks   Status On-going   OT SHORT TERM GOAL #3   Title Pt will increase LUE A/ROM to [facility] to increase ability to complete functional tasks including dressing and meal preparation using LUE.    Time 6   Period Weeks   Status On-going   OT SHORT TERM GOAL #4   Title Pt will increase LUE strength to 4/5 to increase ability to perform transfer tasks and lift weighted objects using LUE.    Time 6   Period Weeks   Status On-going                  Plan - 11/30/15 1122    Clinical Impression Statement A: Used mirror to help self correct shoulder elevation during AA/ROM exercises. Added UBE bike this session to focus on scapular strengthening.    Plan P: Complete Box and block 1 time per week to gauge progress with active movement. Add scapular theraband strengthening.         Problem List Patient Active Problem List   Diagnosis Date Noted   . Polydipsia 10/12/2015  . Bilateral shoulder pain 09/07/2015  . Cataracts, bilateral 01/30/2015  . Palpitations 07/15/2013  . Tobacco abuse 07/15/2013  . Insomnia, transient 07/15/2013  . Preventative health care 09/29/2012  . CHRONIC PAIN SYNDROME 06/26/2010  . ANEMIA 12/12/2008  . Hyperlipidemia 12/29/2006  . PARAPLEGIA 12/29/2006  . HYPERTENSION, BENIGN ESSENTIAL 12/29/2006  . GERD 12/29/2006    Ailene Ravel, OTR/L,CBIS  [phone]  11/30/2015, 11:28 AM  [facility] [address] [place], [place], [address] Phone: [phone]   Fax:  [phone]  Name: Fenris Woody. MRN: [id number] Date of Birth: Mar 07, 1951

## 2015-12-05 ENCOUNTER — Ambulatory Visit (HOSPITAL_COMMUNITY): Payer: BLUE CROSS/BLUE SHIELD | Admitting: Occupational Therapy

## 2015-12-05 ENCOUNTER — Encounter (HOSPITAL_COMMUNITY): Payer: Self-pay | Admitting: Occupational Therapy

## 2015-12-05 DIAGNOSIS — Z658 Other specified problems related to psychosocial circumstances: Secondary | ICD-10-CM | POA: Diagnosis not present

## 2015-12-05 DIAGNOSIS — M6281 Muscle weakness (generalized): Secondary | ICD-10-CM

## 2015-12-05 DIAGNOSIS — S46002S Unspecified injury of muscle(s) and tendon(s) of the rotator cuff of left shoulder, sequela: Secondary | ICD-10-CM | POA: Diagnosis not present

## 2015-12-05 DIAGNOSIS — Z789 Other specified health status: Secondary | ICD-10-CM

## 2015-12-05 NOTE — Therapy (Signed)
[facility] [address] [place], [place], [address] Phone: [phone]   Fax:  [phone]  Occupational Therapy Treatment  Patient Details  Name: Shawn [name]. MRN: [id number] Date of Birth: Aug 27, 1951 Referring Provider: Dr. Edmonia Lynch  Encounter Date: 12/05/2015      OT End of Session - 12/05/15 1134    Visit Number 6   Number of Visits 12   Date for OT Re-Evaluation 01/08/16  mini reassess 12/07/15   Authorization Type BCBS    OT Start Time 1028   OT Stop Time 1100   OT Time Calculation (min) 32 min   Activity Tolerance Patient tolerated treatment well   Behavior During Therapy [facility] for tasks assessed/performed      Past Medical History  Diagnosis Date  . Substance abuse     alcohol  . Hyperlipidemia   . Hypertension   . GERD (gastroesophageal reflux disease)   . Allergy   . Paraplegia ([place])     secondary to [place]  . C. difficile colitis 11/2008  . Decubitus ulcer 1999    excision of right ischial pressure sore with flap reconstruction done by Dr. Denese Killings 10/31/2008  . Perineal abscess     Past Surgical History  Procedure Laterality Date  . Spine surgery    . Bone biopsy  11/2007    negative for organisms  . Sacral wound flap  2002    done by Dr. Stephanie Coup    There were no vitals filed for this visit.  Visit Diagnosis:  Muscle weakness of left upper extremity  Decreased independence with activities of daily living      Subjective Assessment - 12/05/15 1030    Subjective  S: I've been doing my exercises, I still can't get my arm to go straight up.    Currently in Pain? No/denies            [facility] OT Assessment - 12/05/15 1133    Assessment   Diagnosis Left shoulder weakness   Precautions   Precautions None   Precaution Comments Is unable to lay supine.                   OT Treatments/Exercises (OP) - 12/05/15 1030    Exercises   Exercises Shoulder   Shoulder Exercises:  Seated   Protraction PROM;5 reps;AAROM;10 reps   Horizontal ABduction PROM;5 reps;AAROM;10 reps   External Rotation PROM;5 reps;AAROM;10 reps   Internal Rotation PROM;5 reps;AAROM;10 reps   Flexion PROM;5 reps;AAROM;10 reps   Flexion Limitations Able to achieve 50% range.   Abduction PROM;5 reps;AAROM;10 reps   Shoulder Exercises: Pulleys   Flexion 2 minutes   ABduction 2 minutes   Shoulder Exercises: ROM/Strengthening   Other ROM/Strengthening Exercises box and block mod difficulty 3'47"   Manual Therapy   Manual Therapy Myofascial release   Manual therapy comments manual therapy completed prior to exercises.   Myofascial Release Myofascial release completed to trapezius and scapularis region to decrease fascial restrictions and increase joint mobility in a pain free zone.                   OT Short Term Goals - 11/21/15 1220    OT SHORT TERM GOAL #1   Title Pt will be educated on and independent in HEP.    Time 6   Period Weeks   Status On-going   OT SHORT TERM GOAL #2   Title Pt will return to prior level  of functioning and independence in daily and leisure tasks.    Time 6   Period Weeks   Status On-going   OT SHORT TERM GOAL #3   Title Pt will increase LUE A/ROM to [facility] to increase ability to complete functional tasks including dressing and meal preparation using LUE.    Time 6   Period Weeks   Status On-going   OT SHORT TERM GOAL #4   Title Pt will increase LUE strength to 4/5 to increase ability to perform transfer tasks and lift weighted objects using LUE.    Time 6   Period Weeks   Status On-going                  Plan - 12/05/15 1134    Clinical Impression Statement A: Pt arrived late for session. Completed AA/ROM exercises, pulleys, and box and blocks. Pt increased box and blocks time by greater than 1'. Did not add scapular theraband due to time constraints.    Plan P: Mini reassessment. Add scapular theraband strengthening.          Problem List Patient Active Problem List   Diagnosis Date Noted  . Polydipsia 10/12/2015  . Bilateral shoulder pain 09/07/2015  . Cataracts, bilateral 01/30/2015  . Palpitations 07/15/2013  . Tobacco abuse 07/15/2013  . Insomnia, transient 07/15/2013  . Preventative health care 09/29/2012  . CHRONIC PAIN SYNDROME 06/26/2010  . ANEMIA 12/12/2008  . Hyperlipidemia 12/29/2006  . PARAPLEGIA 12/29/2006  . HYPERTENSION, BENIGN ESSENTIAL 12/29/2006  . GERD 12/29/2006    Guadelupe Sabin, OTR/L  [phone]  12/05/2015, 11:40 AM  [facility] [address] [place], [place], [address] Phone: [phone]   Fax:  [phone]  Name: Shawn [name]. MRN: [id number] Date of Birth: Jul 03, 1951

## 2015-12-07 ENCOUNTER — Ambulatory Visit (HOSPITAL_COMMUNITY): Payer: BLUE CROSS/BLUE SHIELD | Admitting: Occupational Therapy

## 2015-12-07 ENCOUNTER — Encounter (HOSPITAL_COMMUNITY): Payer: Self-pay | Admitting: Occupational Therapy

## 2015-12-07 DIAGNOSIS — S46002S Unspecified injury of muscle(s) and tendon(s) of the rotator cuff of left shoulder, sequela: Secondary | ICD-10-CM | POA: Diagnosis not present

## 2015-12-07 DIAGNOSIS — M6281 Muscle weakness (generalized): Secondary | ICD-10-CM

## 2015-12-07 DIAGNOSIS — Z658 Other specified problems related to psychosocial circumstances: Secondary | ICD-10-CM | POA: Diagnosis not present

## 2015-12-07 DIAGNOSIS — Z789 Other specified health status: Secondary | ICD-10-CM

## 2015-12-07 NOTE — Therapy (Signed)
[facility] [place], [place], [address] Phone: [phone]   Fax:  [phone]  Occupational Therapy Reassessment and Treatment  Patient Details  Name: Shawn [name]. MRN: [id number] Date of Birth: January 08, 1951 Referring Provider: Dr. Edmonia Lynch  Encounter Date: 12/07/2015      OT End of Session - 12/07/15 1200    Visit Number 7   Number of Visits 12   Date for OT Re-Evaluation 01/08/16   Authorization Type BCBS    OT Start Time 1108   OT Stop Time 1147   OT Time Calculation (min) 39 min   Activity Tolerance Patient tolerated treatment well   Behavior During Therapy [facility] for tasks assessed/performed      Past Medical History  Diagnosis Date  . Substance abuse     alcohol  . Hyperlipidemia   . Hypertension   . GERD (gastroesophageal reflux disease)   . Allergy   . Paraplegia ([place])     secondary to [place]  . C. difficile colitis 11/2008  . Decubitus ulcer 1999    excision of right ischial pressure sore with flap reconstruction done by Dr. Denese Killings 10/31/2008  . Perineal abscess     Past Surgical History  Procedure Laterality Date  . Spine surgery    . Bone biopsy  11/2007    negative for organisms  . Sacral wound flap  2002    done by Dr. Stephanie Coup    There were no vitals filed for this visit.  Visit Diagnosis:  Muscle weakness of left upper extremity  Decreased independence with activities of daily living         [facility] OT Assessment - 12/07/15 1123    Assessment   Diagnosis Left shoulder weakness   Precautions   Precautions None   Precaution Comments Is unable to lay supine.    Palpation   Palpation comment Min fascial restrictions in upper trapezius region.    AROM   Overall AROM Comments Assessed in sitting , ER/IR adducted   AROM Assessment Site Shoulder   Right/Left Shoulder Left   Left Shoulder Flexion 76 Degrees  previous 51   Left Shoulder ABduction 86 Degrees  previous 80   Left Shoulder Internal Rotation 70 Degrees  previous 48   Left Shoulder External Rotation 65 Degrees  previous 55   PROM   Overall PROM Comments P/ROM is WNL   Strength   Overall Strength Comments Assessed seated, ER/IR adducted   Strength Assessment Site Shoulder   Right/Left Shoulder Left   Left Shoulder Flexion 3-/5  same as previous   Left Shoulder ABduction 3-/5  same as previous   Left Shoulder Internal Rotation 4+/5  same as previous   Left Shoulder External Rotation 4+/5  same as previous                  OT Treatments/Exercises (OP) - 12/07/15 1126    Exercises   Exercises Shoulder   Shoulder Exercises: Seated   Extension Theraband;10 reps   Theraband Level (Shoulder Extension) Level 3 (Green)   Retraction Theraband;10 reps   Theraband Level (Shoulder Retraction) Level 3 (Green)   Row [employer] reps   Theraband Level (Shoulder Row) Level 3 (Green)   Protraction PROM;5 reps;AAROM;12 reps   Horizontal ABduction PROM;5 reps;AAROM;12 reps   External Rotation PROM;5 reps;AAROM;12 reps   Internal Rotation PROM;5 reps;AAROM;12 reps   Flexion PROM;5 reps;AAROM;12 reps   Abduction PROM;5 reps;AAROM;12 reps   Shoulder Exercises:  Pulleys   Flexion 2 minutes   ABduction 2 minutes   Shoulder Exercises: ROM/Strengthening   Wall Wash 1'   Thumb Tacks 1'   Functional Reaching Activities   Mid Level graduated pinch tree, pt able to place resistive clothespin up to approximately 8 inches from bottom of vertical pole.    Manual Therapy   Manual Therapy Myofascial release   Manual therapy comments manual therapy completed prior to exercises.   Myofascial Release Myofascial release completed to trapezius and scapularis region to decrease fascial restrictions and increase joint mobility in a pain free zone.                OT Education - 12/07/15 1159    Education provided Yes   Education Details AA/ROM exercises and green scapular theraband exercises    Person(s) Educated Patient   Methods Demonstration;Explanation;Handout   Comprehension Verbalized understanding;Returned demonstration          OT Short Term Goals - 12/07/15 1200    OT SHORT TERM GOAL #1   Title Pt will be educated on and independent in [place].    Time 6   Period Weeks   Status On-going   OT SHORT TERM GOAL #2   Title Pt will return to prior level of functioning and independence in daily and leisure tasks.    Time 6   Period Weeks   Status On-going   OT SHORT TERM GOAL #3   Title Pt will increase LUE A/ROM to [facility] to increase ability to complete functional tasks including dressing and meal preparation using LUE.    Time 6   Period Weeks   Status On-going   OT SHORT TERM GOAL #4   Title Pt will increase LUE strength to 4/5 to increase ability to perform transfer tasks and lift weighted objects using LUE.    Time 6   Period Weeks   Status On-going                  Plan - 12/07/15 1201    Clinical Impression Statement A: Mini-reassessment completed today, pt has not met any goals, however has made improvements in A/ROM and functional use of LUE. Added wall wash, low thumb tacks, scapular theraband, and functional reaching this session. Pt reports fatigue at end of session. Recommend continuing skilled OT services 2x/week for 2-4 additional weeks to work on increasing functional use of LUE, increasing A/ROM and strength of LUE.    Plan P: Follow up on updated HEP, continue working to increase A/ROM and functional use of LUE. Complete box and blocks.         Problem List Patient Active Problem List   Diagnosis Date Noted  . Polydipsia 10/12/2015  . Bilateral shoulder pain 09/07/2015  . Cataracts, bilateral 01/30/2015  . Palpitations 07/15/2013  . Tobacco abuse 07/15/2013  . Insomnia, transient 07/15/2013  . Preventative health care 09/29/2012  . CHRONIC PAIN SYNDROME 06/26/2010  . ANEMIA 12/12/2008  . Hyperlipidemia 12/29/2006  . PARAPLEGIA  12/29/2006  . HYPERTENSION, BENIGN ESSENTIAL 12/29/2006  . GERD 12/29/2006    Guadelupe Sabin, OTR/L  [phone]  12/07/2015, 12:05 PM  [facility] [address] [place], [place], [address] Phone: [phone]   Fax:  [phone]  Name: Shawn [name]. MRN: [id number] Date of Birth: Jul 05, 1951

## 2015-12-07 NOTE — Patient Instructions (Addendum)
Perform each exercise ____10-15____ reps. 2-3x days.   Protraction - STANDING  Start by holding a wand or cane at chest height.  Next, slowly push the wand outwards in front of your body so that your elbows become fully straightened. Then, return to the original position.     Shoulder FLEXION - STANDING - PALMS UP  In the standing position, hold a wand/cane with both arms, palms up on both sides. Raise up the wand/cane allowing your unaffected arm to perform most of the effort. Your affected arm should be partially relaxed.      Internal/External ROTATION - STANDING  In the standing position, hold a wand/cane with both hands keeping your elbows bent. Move your arms and wand/cane to one side.  Your affected arm should be partially relaxed while your unaffected arm performs most of the effort.       Shoulder ABDUCTION - STANDING  While holding a wand/cane palm face up on the injured side and palm face down on the uninjured side, slowly raise up your injured arm to the side.                     Horizontal Abduction/Adduction      Straight arms holding cane at shoulder height, bring cane to right, center, left. Repeat starting to left.   Copyright  VHI. All rights reserved.         (Home) Extension: Isometric / Bilateral Arm Retraction - Sitting   Facing anchor, hold hands and elbow at shoulder height, with elbow bent.  Pull arms back to squeeze shoulder blades together. Repeat 10-15 times.  Copyright  VHI. All rights reserved.   (Home) Retraction: Row - Bilateral (Anchor)   Facing anchor, arms reaching forward, pull hands toward stomach, keeping elbows bent and at your sides and pinching shoulder blades together. Repeat 10-15 times.  Copyright  VHI. All rights reserved.   (Clinic) Extension / Flexion (Assist)   Face anchor, pull arms back, keeping elbow straight, and squeze shoulder blades together. Repeat 10-15 times.   Copyright   VHI. All rights reserved.

## 2015-12-13 ENCOUNTER — Encounter (HOSPITAL_COMMUNITY): Payer: Self-pay

## 2015-12-13 ENCOUNTER — Ambulatory Visit (HOSPITAL_COMMUNITY): Payer: BLUE CROSS/BLUE SHIELD | Attending: Orthopedic Surgery

## 2015-12-13 DIAGNOSIS — Z658 Other specified problems related to psychosocial circumstances: Secondary | ICD-10-CM | POA: Insufficient documentation

## 2015-12-13 DIAGNOSIS — M6281 Muscle weakness (generalized): Secondary | ICD-10-CM | POA: Insufficient documentation

## 2015-12-13 NOTE — Therapy (Signed)
[facility] [address] [place], [place], [address] Phone: [phone]   Fax:  [phone]  Occupational Therapy Treatment  Patient Details  Name: Shawn [name]. MRN: [id number] Date of Birth: Dec 24, 1950 Referring Provider: Dr. Edmonia Lynch  Encounter Date: 12/13/2015      OT End of Session - 12/13/15 1138    Visit Number 8   Number of Visits 12   Date for OT Re-Evaluation 01/08/16   Authorization Type BCBS    OT Start Time 1020   OT Stop Time 1100   OT Time Calculation (min) 40 min   Activity Tolerance Patient tolerated treatment well   Behavior During Therapy [facility] for tasks assessed/performed      Past Medical History  Diagnosis Date  . Substance abuse     alcohol  . Hyperlipidemia   . Hypertension   . GERD (gastroesophageal reflux disease)   . Allergy   . Paraplegia ([place])     secondary to [place]  . C. difficile colitis 11/2008  . Decubitus ulcer 1999    excision of right ischial pressure sore with flap reconstruction done by Dr. Denese Killings 10/31/2008  . Perineal abscess     Past Surgical History  Procedure Laterality Date  . Spine surgery    . Bone biopsy  11/2007    negative for organisms  . Sacral wound flap  2002    done by Dr. Stephanie Coup    There were no vitals filed for this visit.  Visit Diagnosis:  Muscle weakness of left upper extremity      Subjective Assessment - 12/13/15 1135    Subjective  S: I've been crawling up the wall with my hand and trying to stretch it up the wall.    Currently in Pain? No/denies            [facility] OT Assessment - 12/13/15 1023    Assessment   Diagnosis Left shoulder weakness   Precautions   Precautions None   Precaution Comments Is unable to lay supine.    Coordination   Box and Blocks Pt transferred all blocks: 3'03"   previous: 3'47"                  OT Treatments/Exercises (OP) - 12/13/15 1136    Exercises   Exercises Shoulder   Shoulder  Exercises: Seated   Extension Theraband;12 reps   Theraband Level (Shoulder Extension) Level 3 (Green)   Retraction Theraband;12 reps   Theraband Level (Shoulder Retraction) Level 3 (Green)   Row [employer] reps   Theraband Level (Shoulder Row) Level 3 (Green)   Shoulder Exercises: ROM/Strengthening   UBE (Upper Arm Bike) Level 1 3' reverse 3' forward   Functional Reaching Activities   Mid Level Resistve clothespins; patient able to place pins up to 13.5 inch mark on vertical pole with max difficulty.    Manual Therapy   Manual Therapy Myofascial release   Manual therapy comments manual therapy completed prior to exercises.   Myofascial Release Myofascial release completed to trapezius and scapularis region to decrease fascial restrictions and increase joint mobility in a pain free zone.                   OT Short Term Goals - 12/07/15 1200    OT SHORT TERM GOAL #1   Title Pt will be educated on and independent in HEP.    Time 6   Period Weeks   Status On-going  OT SHORT TERM GOAL #2   Title Pt will return to prior level of functioning and independence in daily and leisure tasks.    Time 6   Period Weeks   Status On-going   OT SHORT TERM GOAL #3   Title Pt will increase LUE A/ROM to [facility] to increase ability to complete functional tasks including dressing and meal preparation using LUE.    Time 6   Period Weeks   Status On-going   OT SHORT TERM GOAL #4   Title Pt will increase LUE strength to 4/5 to increase ability to perform transfer tasks and lift weighted objects using LUE.    Time 6   Period Weeks   Status On-going                  Plan - 12/13/15 1139    Clinical Impression Statement A: Completed box and blocks test having patient move all blocks over for time. Patient improved previous score and had little to no difficulty with task.    Plan P: Complete functional reaching (ie. cones and/or overhead lacing) and [employer] row        Problem  List Patient Active Problem List   Diagnosis Date Noted  . Polydipsia 10/12/2015  . Bilateral shoulder pain 09/07/2015  . Cataracts, bilateral 01/30/2015  . Palpitations 07/15/2013  . Tobacco abuse 07/15/2013  . Insomnia, transient 07/15/2013  . Preventative health care 09/29/2012  . CHRONIC PAIN SYNDROME 06/26/2010  . ANEMIA 12/12/2008  . Hyperlipidemia 12/29/2006  . PARAPLEGIA 12/29/2006  . HYPERTENSION, BENIGN ESSENTIAL 12/29/2006  . GERD 12/29/2006    Ailene Ravel, OTR/L,CBIS  [phone]  12/13/2015, 11:41 AM  [facility] [address] [place], [place], [address] Phone: [phone]   Fax:  [phone]  Name: Taiton Arciero. MRN: [id number] Date of Birth: [birth year]

## 2015-12-15 ENCOUNTER — Encounter (HOSPITAL_COMMUNITY): Payer: Self-pay

## 2015-12-15 ENCOUNTER — Ambulatory Visit (HOSPITAL_COMMUNITY): Payer: BLUE CROSS/BLUE SHIELD

## 2015-12-15 DIAGNOSIS — M6281 Muscle weakness (generalized): Secondary | ICD-10-CM | POA: Diagnosis not present

## 2015-12-15 DIAGNOSIS — Z658 Other specified problems related to psychosocial circumstances: Secondary | ICD-10-CM | POA: Diagnosis not present

## 2015-12-15 NOTE — Therapy (Signed)
[facility] [address] [place], [place], [address] Phone: [phone]   Fax:  [phone]  Occupational Therapy Treatment  Patient Details  Name: Shawn [name]. MRN: [id number] Date of Birth: 02-24-1951 Referring Provider: Dr. Edmonia Lynch  Encounter Date: 12/15/2015      OT End of Session - 12/15/15 1206    Visit Number 9   Number of Visits 12   Date for OT Re-Evaluation 01/08/16   Authorization Type BCBS    OT Start Time 1105   OT Stop Time 1145   OT Time Calculation (min) 40 min   Activity Tolerance Patient tolerated treatment well   Behavior During Therapy [facility] for tasks assessed/performed      Past Medical History  Diagnosis Date  . Substance abuse     alcohol  . Hyperlipidemia   . Hypertension   . GERD (gastroesophageal reflux disease)   . Allergy   . Paraplegia ([place])     secondary to [place]  . C. difficile colitis 11/2008  . Decubitus ulcer 1999    excision of right ischial pressure sore with flap reconstruction done by Dr. Denese Killings 10/31/2008  . Perineal abscess     Past Surgical History  Procedure Laterality Date  . Spine surgery    . Bone biopsy  11/2007    negative for organisms  . Sacral wound flap  2002    done by Dr. Stephanie Coup    There were no vitals filed for this visit.  Visit Diagnosis:  Muscle weakness of left upper extremity      Subjective Assessment - 12/15/15 1205    Subjective  S: Nothing new.    Currently in Pain? No/denies                      OT Treatments/Exercises (OP) - 12/15/15 0001    Exercises   Exercises Shoulder   Shoulder Exercises: Seated   Extension Theraband;20 reps   Theraband Level (Shoulder Extension) Level 4 (Blue)   Retraction Theraband;20 reps   Theraband Level (Shoulder Retraction) Level 4 (Blue)   Row Theraband;20 reps   Theraband Level (Shoulder Row) Level 4 (Blue)   Protraction PROM;5 reps;AAROM;12 reps   Horizontal ABduction PROM;5  reps;AAROM;12 reps   External Rotation PROM;5 reps;AAROM;12 reps   Internal Rotation PROM;5 reps;AAROM;12 reps   Flexion PROM;5 reps;AAROM;12 reps   Flexion Limitations Able to achieve 50% range.   Abduction PROM;5 reps;AAROM;12 reps   ABduction Limitations able to achieve 50% range.   Shoulder Exercises: Pulleys   Flexion 1 minute   ABduction 1 minute   Shoulder Exercises: ROM/Strengthening   Cybex Row 2.5 plate;15 reps   Thumb Tacks 30 seconds.   poor form and extreme shoulder elevation to complete   Over Head Lace 1'   Functional Reaching Activities   Mid Level Patient completed functional reaching task with cone stacking. Table was placed at 36 inches high. During shoulder abduction was able to stack 10 cones high. Shoulder flexion 8 fcones                   OT Short Term Goals - 12/07/15 1200    OT SHORT TERM GOAL #1   Title Pt will be educated on and independent in HEP.    Time 6   Period Weeks   Status On-going   OT SHORT TERM GOAL #2   Title Pt will return to prior level of functioning and independence  in daily and leisure tasks.    Time 6   Period Weeks   Status On-going   OT SHORT TERM GOAL #3   Title Pt will increase LUE A/ROM to [facility] to increase ability to complete functional tasks including dressing and meal preparation using LUE.    Time 6   Period Weeks   Status On-going   OT SHORT TERM GOAL #4   Title Pt will increase LUE strength to 4/5 to increase ability to perform transfer tasks and lift weighted objects using LUE.    Time 6   Period Weeks   Status On-going                  Plan - 12/15/15 1212    Clinical Impression Statement A: Focused most of session on functional reaching tasks. Patient did present with decreased proximal shoulder strengthening and had difficulty with muscle fatigue.   Plan P: Continue with functional reaching activities. Add proximal shoulder strengthening. Complete box and blocks. Increase weight on Cybex row.         Problem List Patient Active Problem List   Diagnosis Date Noted  . Polydipsia 10/12/2015  . Bilateral shoulder pain 09/07/2015  . Cataracts, bilateral 01/30/2015  . Palpitations 07/15/2013  . Tobacco abuse 07/15/2013  . Insomnia, transient 07/15/2013  . Preventative health care 09/29/2012  . CHRONIC PAIN SYNDROME 06/26/2010  . ANEMIA 12/12/2008  . Hyperlipidemia 12/29/2006  . PARAPLEGIA 12/29/2006  . HYPERTENSION, BENIGN ESSENTIAL 12/29/2006  . GERD 12/29/2006    Shawn [name], OTR/L,CBIS  [phone]  12/15/2015, 12:26 PM  [facility] [address] [place], [place], [address] Phone: [phone]   Fax:  [phone]  Name: Shawn [name]. MRN: [id number] Date of Birth: Jun 21, 1951

## 2015-12-20 ENCOUNTER — Ambulatory Visit (INDEPENDENT_AMBULATORY_CARE_PROVIDER_SITE_OTHER): Payer: BLUE CROSS/BLUE SHIELD | Admitting: Internal Medicine

## 2015-12-20 ENCOUNTER — Ambulatory Visit (HOSPITAL_COMMUNITY): Payer: BLUE CROSS/BLUE SHIELD

## 2015-12-20 VITALS — BP 150/90 | HR 70 | Temp 98.7°F

## 2015-12-20 DIAGNOSIS — E785 Hyperlipidemia, unspecified: Secondary | ICD-10-CM

## 2015-12-20 DIAGNOSIS — G894 Chronic pain syndrome: Secondary | ICD-10-CM | POA: Diagnosis not present

## 2015-12-20 DIAGNOSIS — M6281 Muscle weakness (generalized): Secondary | ICD-10-CM | POA: Diagnosis not present

## 2015-12-20 DIAGNOSIS — Z658 Other specified problems related to psychosocial circumstances: Secondary | ICD-10-CM | POA: Diagnosis not present

## 2015-12-20 DIAGNOSIS — Z79891 Long term (current) use of opiate analgesic: Secondary | ICD-10-CM | POA: Diagnosis not present

## 2015-12-20 DIAGNOSIS — I10 Essential (primary) hypertension: Secondary | ICD-10-CM

## 2015-12-20 DIAGNOSIS — F17210 Nicotine dependence, cigarettes, uncomplicated: Secondary | ICD-10-CM

## 2015-12-20 DIAGNOSIS — Z79899 Other long term (current) drug therapy: Secondary | ICD-10-CM

## 2015-12-20 MED ORDER — MORPHINE SULFATE ER 15 MG PO TBCR: 15.0000 mg | EXTENDED_RELEASE_TABLET | Freq: Two times a day (BID) | ORAL | Status: DC

## 2015-12-20 MED ORDER — OXYCODONE-ACETAMINOPHEN 10-325 MG PO TABS: 1.0000 | ORAL_TABLET | ORAL | Status: DC | PRN

## 2015-12-20 MED ORDER — EZETIMIBE 10 MG PO TABS: 10.0000 mg | ORAL_TABLET | Freq: Every day | ORAL | Status: DC

## 2015-12-20 MED ORDER — AMLODIPINE BESYLATE 5 MG PO TABS: 5.0000 mg | ORAL_TABLET | Freq: Every day | ORAL | Status: DC

## 2015-12-20 NOTE — Therapy (Signed)
[facility] [address] [place], [place], [address] Phone: [phone]   Fax:  [phone]  Occupational Therapy Treatment  Patient Details  Name: Shawn [name]. MRN: [id number] Date of Birth: [birth year] Referring Provider: Dr. Edmonia Lynch  Encounter Date: 12/20/2015      OT End of Session - 12/20/15 1149    Visit Number 10   Number of Visits 12   Date for OT Re-Evaluation 01/08/16   Authorization Type BCBS    OT Start Time 1028  Patient arrived late   OT Stop Time 1100   OT Time Calculation (min) 32 min   Activity Tolerance Patient tolerated treatment well   Behavior During Therapy [facility] for tasks assessed/performed      Past Medical History  Diagnosis Date  . Substance abuse     alcohol  . Hyperlipidemia   . Hypertension   . GERD (gastroesophageal reflux disease)   . Allergy   . Paraplegia ([place])     secondary to [place]  . C. difficile colitis 11/2008  . Decubitus ulcer 1999    excision of right ischial pressure sore with flap reconstruction done by Dr. Denese Killings 10/31/2008  . Perineal abscess     Past Surgical History  Procedure Laterality Date  . Spine surgery    . Bone biopsy  11/2007    negative for organisms  . Sacral wound flap  2002    done by Dr. Stephanie Coup    There were no vitals filed for this visit.  Visit Diagnosis:  Muscle weakness of left upper extremity          [facility] OT Assessment - 12/20/15 1031    Assessment   Diagnosis Left shoulder weakness   Precautions   Precautions None   Precaution Comments Is unable to lay supine.    Coordination   Box and Blocks Pt transferred all blocks: 2'57"                   OT Treatments/Exercises (OP) - 12/20/15 0001    Exercises   Exercises Shoulder   Shoulder Exercises: Seated   Protraction AAROM;12 reps   Horizontal ABduction AAROM;12 reps   External Rotation AAROM;12 reps   Internal Rotation AAROM;12 reps   Flexion AAROM;12 reps    Flexion Limitations Able to achieve 50% range.   Abduction AAROM;12 reps   ABduction Limitations able to achieve 50% range.   Functional Reaching Activities   Mid Level Patient completed functional reaching task with cone stacking. Table was placed at 36 inches high. During shoulder abduction was able to stack 12 cones high. Shoulder flexion 12 cones    High Level Resistive clothespins; patient able to place pins up to 14.5 inch mark on vertical pole with max difficulty.                  OT Short Term Goals - 12/07/15 1200    OT SHORT TERM GOAL #1   Title Pt will be educated on and independent in HEP.    Time 6   Period Weeks   Status On-going   OT SHORT TERM GOAL #2   Title Pt will return to prior level of functioning and independence in daily and leisure tasks.    Time 6   Period Weeks   Status On-going   OT SHORT TERM GOAL #3   Title Pt will increase LUE A/ROM to [facility] to increase ability to complete functional tasks including dressing  and meal preparation using LUE.    Time 6   Period Weeks   Status On-going   OT SHORT TERM GOAL #4   Title Pt will increase LUE strength to 4/5 to increase ability to perform transfer tasks and lift weighted objects using LUE.    Time 6   Period Weeks   Status On-going                  Plan - 12/20/15 1150    Clinical Impression Statement A: Box and blocks completed this date. Patient decreased his time from last week when transferring all blocks with left UE. Patient continues to require VC for form and technique. Abduction continues to be most challenging during seated AA/ROM.   Plan P: Continue to focus on functional reaching activities. Add proximal shoulder strengthening. D/C P/ROM. Myofascial release PRN. Increase weight on Cybex row.         Problem List Patient Active Problem List   Diagnosis Date Noted  . Polydipsia 10/12/2015  . Bilateral shoulder pain 09/07/2015  . Cataracts, bilateral 01/30/2015  .  Palpitations 07/15/2013  . Tobacco abuse 07/15/2013  . Insomnia, transient 07/15/2013  . Preventative health care 09/29/2012  . CHRONIC PAIN SYNDROME 06/26/2010  . ANEMIA 12/12/2008  . Hyperlipidemia 12/29/2006  . PARAPLEGIA 12/29/2006  . HYPERTENSION, BENIGN ESSENTIAL 12/29/2006  . GERD 12/29/2006    Ailene Ravel, OTR/L,CBIS  [phone]  12/20/2015, 11:53 AM  [facility] [address] [place], [place], [address] Phone: [phone]   Fax:  [phone]  Name: Shawn [name]. MRN: [id number] Date of Birth: [birth year]

## 2015-12-20 NOTE — Patient Instructions (Signed)
Thank you for coming in today  -I am starting you on a new blood pressure medication called Norvasc (Amlodipine). Please take it daily. -I am also starting you on a new cholesterol medication called Zetia (Ezetimibe). If you have any side effects please stop taking it an let us know. - I would like to see you back in 3 months for follow up.  Ezetimibe Tablets What is this medicine? EZETIMIBE (ez ET i mibe) blocks the absorption of cholesterol from the stomach. It can help lower blood cholesterol for patients who are at risk of getting heart disease or a stroke. It is only for patients whose cholesterol level is not controlled by diet. This medicine may be used for other purposes; ask your health care provider or pharmacist if you have questions. What should I tell my health care provider before I take this medicine? They need to know if you have any of these conditions: -liver disease -an unusual or allergic reaction to ezetimibe, medicines, foods, dyes, or preservatives -pregnant or trying to get pregnant -breast-feeding How should I use this medicine? Take this medicine by mouth with a glass of water. Follow the directions on the prescription label. This medicine can be taken with or without food. Take your doses at regular intervals. Do not take your medicine more often than directed. Talk to your pediatrician regarding the use of this medicine in children. Special care may be needed. Overdosage: If you think you have taken too much of this medicine contact a poison control center or emergency room at once. NOTE: This medicine is only for you. Do not share this medicine with others. What if I miss a dose? If you miss a dose, take it as soon as you can. If it is almost time for your next dose, take only that dose. Do not take double or extra doses. What may interact with this medicine? Do not take this medicine with any of the following medications: -fenofibrate -gemfibrozil This  medicine may also interact with the following medications: -antacids -cyclosporine -herbal medicines like red yeast rice -other medicines to lower cholesterol or triglycerides This list may not describe all possible interactions. Give your health care provider a list of all the medicines, herbs, non-prescription drugs, or dietary supplements you use. Also tell them if you smoke, drink alcohol, or use illegal drugs. Some items may interact with your medicine. What should I watch for while using this medicine? Visit your doctor or health care professional for regular checks on your progress. You will need to have your cholesterol levels checked. If you are also taking some other cholesterol medicines, you will also need to have tests to make sure your liver is working properly. Tell your doctor or health care professional if you get any unexplained muscle pain, tenderness, or weakness, especially if you also have a fever and tiredness. You need to follow a low-cholesterol, low-fat diet while you are taking this medicine. This will decrease your risk of getting heart and blood vessel disease. Exercising and avoiding alcohol and smoking can also help. Ask your doctor or dietician for advice. What side effects may I notice from receiving this medicine? Side effects that you should report to your doctor or health care professional as soon as possible: -allergic reactions like skin rash, itching or hives, swelling of the face, lips, or tongue -dark yellow or brown urine -unusually weak or tired -yellowing of the skin or eyes Side effects that usually do not require medical attention (report to  your doctor or health care professional if they continue or are bothersome): -diarrhea -dizziness -headache -stomach upset or pain This list may not describe all possible side effects. Call your doctor for medical advice about side effects. You may report side effects to FDA at 1-800-FDA-1088. Where should I  keep my medicine? Keep out of the reach of children. Store at room temperature between 15 and 30 degrees C (59 and 86 degrees F). Protect from moisture. Keep container tightly closed. Throw away any unused medicine after the expiration date. NOTE: This sheet is a summary. It may not cover all possible information. If you have questions about this medicine, talk to your doctor, pharmacist, or health care provider.    2016, Elsevier/Gold Standard. (2012-06-01 15:39:09)  Amlodipine tablets What is this medicine? AMLODIPINE (am LOE di peen) is a calcium-channel blocker. It affects the amount of calcium found in your heart and muscle cells. This relaxes your blood vessels, which can reduce the amount of work the heart has to do. This medicine is used to lower high blood pressure. It is also used to prevent chest pain. This medicine may be used for other purposes; ask your health care provider or pharmacist if you have questions. What should I tell my health care provider before I take this medicine? They need to know if you have any of these conditions: -heart problems like heart failure or aortic stenosis -liver disease -an unusual or allergic reaction to amlodipine, other medicines, foods, dyes, or preservatives -pregnant or trying to get pregnant -breast-feeding How should I use this medicine? Take this medicine by mouth with a glass of water. Follow the directions on the prescription label. Take your medicine at regular intervals. Do not take more medicine than directed. Talk to your pediatrician regarding the use of this medicine in children. Special care may be needed. This medicine has been used in children as young as 6. Persons over 62 years old may have a stronger reaction to this medicine and need smaller doses. Overdosage: If you think you have taken too much of this medicine contact a poison control center or emergency room at once. NOTE: This medicine is only for you. Do not share  this medicine with others. What if I miss a dose? If you miss a dose, take it as soon as you can. If it is almost time for your next dose, take only that dose. Do not take double or extra doses. What may interact with this medicine? -herbal or dietary supplements -local or general anesthetics -medicines for high blood pressure -medicines for prostate problems -rifampin This list may not describe all possible interactions. Give your health care provider a list of all the medicines, herbs, non-prescription drugs, or dietary supplements you use. Also tell them if you smoke, drink alcohol, or use illegal drugs. Some items may interact with your medicine. What should I watch for while using this medicine? Visit your doctor or health care professional for regular check ups. Check your blood pressure and pulse rate regularly. Ask your health care professional what your blood pressure and pulse rate should be, and when you should contact him or her. This medicine may make you feel confused, dizzy or lightheaded. Do not drive, use machinery, or do anything that needs mental alertness until you know how this medicine affects you. To reduce the risk of dizzy or fainting spells, do not sit or stand up quickly, especially if you are an older patient. Avoid alcoholic drinks; they can make you  more dizzy. Do not suddenly stop taking amlodipine. Ask your doctor or health care professional how you can gradually reduce the dose. What side effects may I notice from receiving this medicine? Side effects that you should report to your doctor or health care professional as soon as possible: -allergic reactions like skin rash, itching or hives, swelling of the face, lips, or tongue -breathing problems -changes in vision or hearing -chest pain -fast, irregular heartbeat -swelling of legs or ankles Side effects that usually do not require medical attention (report to your doctor or health care professional if they  continue or are bothersome): -dry mouth -facial flushing -nausea, vomiting -stomach gas, pain -tired, weak -trouble sleeping This list may not describe all possible side effects. Call your doctor for medical advice about side effects. You may report side effects to FDA at 1-800-FDA-1088. Where should I keep my medicine? Keep out of the reach of children. Store at room temperature between 59 and 86 degrees F (15 and 30 degrees C). Protect from light. Keep container tightly closed. Throw away any unused medicine after the expiration date. NOTE: This sheet is a summary. It may not cover all possible information. If you have questions about this medicine, talk to your doctor, pharmacist, or health care provider.    2016, Elsevier/Gold Standard. (2012-10-23 11:40:58)

## 2015-12-21 NOTE — Progress Notes (Signed)
Patient ID: Shawn Grandchild., male   DOB: 04-07-1951, 65 y.o.   MRN: [id number]   Subjective:   Patient ID: Shawn Grandchild. male   DOB: June 06, 1951 4 y.o.   MRN: [id number]  HPI: Shawn L Javonne Borke. is a 65 y.o. male with a past medical history listed below here today for follow up of his HLD, HTN and chronic pain.   For details of today's visit and the status of his chronic medical issues please refer to the assessment and plan.  Past Medical History  Diagnosis Date  . Substance abuse     alcohol  . Hyperlipidemia   . Hypertension   . GERD (gastroesophageal reflux disease)   . Allergy   . Paraplegia ([place])     secondary to [place]  . C. difficile colitis 11/2008  . Decubitus ulcer 1999    excision of right ischial pressure sore with flap reconstruction done by Dr. Denese Killings 10/31/2008  . Perineal abscess    Current Outpatient Prescriptions  Medication Sig Dispense Refill  . amLODipine (NORVASC) 5 MG tablet Take 1 tablet (5 mg total) by mouth daily. 90 tablet 2  . buPROPion (WELLBUTRIN SR) 150 MG 12 hr tablet Take 1 tablet (150 mg total) by mouth 2 (two) times daily. 60 tablet 5  . ezetimibe (ZETIA) 10 MG tablet Take 1 tablet (10 mg total) by mouth daily. 90 tablet 2  . hydrochlorothiazide (HYDRODIURIL) 25 MG tablet Take 1 tablet (25 mg total) by mouth daily. 90 tablet 3  . lansoprazole (PREVACID) 30 MG capsule TAKE 1 CAPSULE (30 MG TOTAL) BY MOUTH DAILY. 90 capsule 3  . lisinopril (PRINIVIL,ZESTRIL) 40 MG tablet TAKE 1 TABLET (40 MG TOTAL) BY MOUTH DAILY. 90 tablet 3  . morphine (MS CONTIN) 15 MG 12 hr tablet Take 1 tablet (15 mg total) by mouth every 12 (twelve) hours. 60 tablet 0  . Multiple Vitamin (MULTIVITAMIN WITH MINERALS) TABS Take 1 tablet by mouth daily.    . naloxone (NARCAN) 0.4 MG/ML injection Inject 1 mL (0.4 mg total) into the vein as needed. 1 mL 0  . oxyCODONE-acetaminophen (PERCOCET) 10-325 MG tablet Take 1 tablet by mouth every 4 (four) hours as needed for pain.  Fill 30 days after previous Rx 120 tablet 0  . zolpidem (AMBIEN CR) 6.25 MG CR tablet Take 1 tablet (6.25 mg total) by mouth at bedtime as needed for sleep. 30 tablet 2   No current facility-administered medications for this visit.   Family History  Problem Relation Age of Onset  . Stroke Mother   . Coronary artery disease Father   . Coronary artery disease Paternal Uncle   . Coronary artery disease Paternal Aunt    Social History   Social History  . Marital Status: Married    Spouse Name: N/A  . Number of Children: 1  . Years of Education: N/A   Occupational History  . DISABILITY    Social History Main Topics  . Smoking status: Current Every Day Smoker -- 0.50 packs/day for 40 years    Types: Cigarettes    Last Attempt to Quit: 08/09/2008  . Smokeless tobacco: Never Used     Comment: 1 PACK 2 DAYS  . Alcohol Use: No  . Drug Use: No  . Sexual Activity: Not on file   Other Topics Concern  . Not on file   Social History Narrative   Married, disabled, medicaid, functions independently at home, paraplegic  Family hx of : hypertension, diabetes, kidney disease, and other cancer   Review of Systems: Review of Systems  Eyes: Negative for blurred vision.  Gastrointestinal: Negative for abdominal pain, diarrhea and constipation.  Musculoskeletal: Positive for joint pain. Negative for myalgias.  Neurological: Negative for headaches.   Objective:  Physical Exam: Filed Vitals:   12/20/15 1501 12/20/15 1555  BP: 166/90 150/90  Pulse: 79 70  Temp: 98.7 F (37.1 C)   TempSrc: Oral   SpO2: 99%    PHSYICAL EXAM GENERAL- alert, co-operative, appears as stated age, not in any distress. Sitting in wheelchair. HEENT- Atraumatic, normocephalic CARDIAC- RRR, no murmurs, rubs or gallops. RESP- Moving equal volumes of air, and clear to auscultation bilaterally, no wheezes or crackles.  Assessment & Plan:  Case discussed with Dr. Beryle Beams. Please refer to Problem based  charting for further details of today's visit.

## 2015-12-21 NOTE — Assessment & Plan Note (Signed)
BP Readings from Last 3 Encounters:  12/20/15 150/90  11/08/15 166/99  10/25/15 132/85    Lab Results  Component Value Date   NA 133* 03/22/2015   K 3.5 03/22/2015   CREATININE 0.58 03/22/2015    Assessment: Patient currently on HCTZ 25 mg daily and Lisinopril 40 mg daily. Did not bring a BP log with him today. Grand-daughter with him today reports when she checks his blood pressure at home it is usually between [id number] systolic and [id number] diastolic. Denies any headaches or vision changes. BP today was 166/90 initially and 150/90 on recheck.   Plan: Patient with persistent elevated blood pressures. Will continue HCTZ and Lisinopril and add Norvasc 5 mg daily today.

## 2015-12-21 NOTE — Progress Notes (Signed)
Medicine attending: Medical history, presenting problems, physical findings, and medications, reviewed with resident physician Dr Nathan Boswell on the day of the patient visit and I concur with his evaluation and management plan. 

## 2015-12-21 NOTE — Assessment & Plan Note (Signed)
Patient reports he has been taking the MS Conitn 15 mg twice a day and the Percocet 10-325 2 times a day on average. Reports some days he does not take any Percocet. Reports pain is much better controlled now. Down to 60 morphine equivalents. Average pain over the past week: 6/10 (improved form 7/10 last visit) Pain interferes with enjoyment of life: 5/10 (improved from 9/10 last visit) Pain interferes with general activity: 6/10 (improved from 7/10 at last visit)  Patient has Narcan available.  Plan MS Contin 15 mg bid #60 x3 Percocet 10-325 mg #120 prn x3 Will have patient sign new pain contract at next visit with new regimen

## 2015-12-21 NOTE — Assessment & Plan Note (Signed)
Patient stopped taking the Atorvastatin completely since his last visit. He has had complete resolution of his diarrhea since stopping the medication.   Plan Unable to tolerate statins 2/2 diarrhea. Will add Ezetimibe 10 mg daily today due to his CV risk.

## 2015-12-22 ENCOUNTER — Ambulatory Visit (HOSPITAL_COMMUNITY): Payer: BLUE CROSS/BLUE SHIELD | Admitting: Occupational Therapy

## 2015-12-22 DIAGNOSIS — Z658 Other specified problems related to psychosocial circumstances: Secondary | ICD-10-CM | POA: Diagnosis not present

## 2015-12-22 DIAGNOSIS — Z789 Other specified health status: Secondary | ICD-10-CM

## 2015-12-22 DIAGNOSIS — M6281 Muscle weakness (generalized): Secondary | ICD-10-CM | POA: Diagnosis not present

## 2015-12-22 NOTE — Therapy (Signed)
[facility] [address] [place], [place], [address] Phone: [phone]   Fax:  [phone]  Occupational Therapy Treatment  Patient Details  Name: Shawn [name]. MRN: [id number] Date of Birth: [birth year] Referring Provider: Dr. Edmonia Lynch  Encounter Date: 12/22/2015      OT End of Session - 12/22/15 1152    Visit Number 11   Number of Visits 12   Date for OT Re-Evaluation 01/08/16   Authorization Type BCBS    OT Start Time 1106   OT Stop Time 1153   OT Time Calculation (min) 47 min   Activity Tolerance Patient tolerated treatment well   Behavior During Therapy [facility] for tasks assessed/performed      Past Medical History  Diagnosis Date  . Substance abuse     alcohol  . Hyperlipidemia   . Hypertension   . GERD (gastroesophageal reflux disease)   . Allergy   . Paraplegia ([place])     secondary to [place]  . C. difficile colitis 11/2008  . Decubitus ulcer 1999    excision of right ischial pressure sore with flap reconstruction done by Dr. Denese Killings 10/31/2008  . Perineal abscess     Past Surgical History  Procedure Laterality Date  . Spine surgery    . Bone biopsy  11/2007    negative for organisms  . Sacral wound flap  2002    done by Dr. Stephanie Coup    There were no vitals filed for this visit.  Visit Diagnosis:  Muscle weakness of left upper extremity  Decreased independence with activities of daily living                    OT Treatments/Exercises (OP) - 12/22/15 1108    Exercises   Exercises Shoulder   Shoulder Exercises: Seated   Extension Theraband;20 reps   Theraband Level (Shoulder Extension) Level 4 (Blue)   Retraction Theraband;20 reps   Theraband Level (Shoulder Retraction) Level 4 (Blue)   Row Theraband;20 reps   Theraband Level (Shoulder Row) Level 4 (Blue)   Protraction AAROM;12 reps   Horizontal ABduction AAROM;12 reps   External Rotation AAROM;12 reps   Internal Rotation  AAROM;12 reps   Flexion AAROM;12 reps   Flexion Limitations Able to achieve 50% range.   Abduction AAROM;12 reps   ABduction Limitations able to achieve 50% range.   Shoulder Exercises: ROM/Strengthening   UBE (Upper Arm Bike) Level 1 3' reverse 3' forward   Cybex Row 3 plate;15 reps   Thumb Tacks 1'  continues to demontrate poor form and shoulder elevation   Over Head Lace 2'   Functional Reaching Activities   Mid Level Functional reaching task with cone stacking. Able to stack 12 cones in flexion and 14 cones in abduction. Table at 36 inches   High Level Resistive clothespins; pt able to place pins up to 15 inch mark on vertical pole with max difficulty                  OT Short Term Goals - 12/07/15 1200    OT SHORT TERM GOAL #1   Title Pt will be educated on and independent in HEP.    Time 6   Period Weeks   Status On-going   OT SHORT TERM GOAL #2   Title Pt will return to prior level of functioning and independence in daily and leisure tasks.    Time 6   Period Weeks  Status On-going   OT SHORT TERM GOAL #3   Title Pt will increase LUE A/ROM to [facility] to increase ability to complete functional tasks including dressing and meal preparation using LUE.    Time 6   Period Weeks   Status On-going   OT SHORT TERM GOAL #4   Title Pt will increase LUE strength to 4/5 to increase ability to perform transfer tasks and lift weighted objects using LUE.    Time 6   Period Weeks   Status On-going                  Plan - 12/22/15 1153    Clinical Impression Statement A: Resumed scapular theraband, overhead lacing, UBE. Pt able to increase functional reach during clothespin task and cone stacking in abduction. Pt continues to required cuing for form and technique during exercises.    Plan P: Box and blocks, add proximal shoulder strengthening. Work on independence with form during AA/ROM exercises.         Problem List Patient Active Problem List   Diagnosis  Date Noted  . Polydipsia 10/12/2015  . Bilateral shoulder pain 09/07/2015  . Cataracts, bilateral 01/30/2015  . Palpitations 07/15/2013  . Tobacco abuse 07/15/2013  . Insomnia, transient 07/15/2013  . Preventative health care 09/29/2012  . CHRONIC PAIN SYNDROME 06/26/2010  . ANEMIA 12/12/2008  . Hyperlipidemia 12/29/2006  . PARAPLEGIA 12/29/2006  . HYPERTENSION, BENIGN ESSENTIAL 12/29/2006  . GERD 12/29/2006    Guadelupe Sabin, OTR/L  [phone]  12/22/2015, 11:55 AM  [facility] [address] [place], [place], [address] Phone: [phone]   Fax:  [phone]  Name: Shawn [name]. MRN: [id number] Date of Birth: Sep 07, 1951

## 2015-12-26 ENCOUNTER — Ambulatory Visit (HOSPITAL_COMMUNITY): Payer: BLUE CROSS/BLUE SHIELD | Admitting: Occupational Therapy

## 2015-12-26 ENCOUNTER — Encounter (HOSPITAL_COMMUNITY): Payer: Self-pay | Admitting: Occupational Therapy

## 2015-12-26 DIAGNOSIS — Z789 Other specified health status: Secondary | ICD-10-CM

## 2015-12-26 DIAGNOSIS — Z658 Other specified problems related to psychosocial circumstances: Secondary | ICD-10-CM

## 2015-12-26 DIAGNOSIS — M6281 Muscle weakness (generalized): Secondary | ICD-10-CM

## 2015-12-26 NOTE — Therapy (Signed)
[facility] [address] [place], [place], [address] Phone: [phone]   Fax:  [phone]  Occupational Therapy Treatment  Patient Details  Name: Shawn [name]. MRN: [id number] Date of Birth: 07/27/1951 Referring Provider: Dr. Edmonia Lynch  Encounter Date: 12/26/2015      OT End of Session - 12/26/15 1204    Visit Number 12   Number of Visits 13   Date for OT Re-Evaluation 01/08/16   Authorization Type BCBS    OT Start Time 1025  pt arrived late   OT Stop Time 1102   OT Time Calculation (min) 37 min   Activity Tolerance Patient tolerated treatment well   Behavior During Therapy [facility] for tasks assessed/performed      Past Medical History  Diagnosis Date  . Substance abuse     alcohol  . Hyperlipidemia   . Hypertension   . GERD (gastroesophageal reflux disease)   . Allergy   . Paraplegia ([place])     secondary to [place]  . C. difficile colitis 11/2008  . Decubitus ulcer 1999    excision of right ischial pressure sore with flap reconstruction done by Dr. Denese Killings 10/31/2008  . Perineal abscess     Past Surgical History  Procedure Laterality Date  . Spine surgery    . Bone biopsy  11/2007    negative for organisms  . Sacral wound flap  2002    done by Dr. Stephanie Coup    There were no vitals filed for this visit.  Visit Diagnosis:  Muscle weakness of left upper extremity  Decreased independence with activities of daily living      Subjective Assessment - 12/26/15 1027    Subjective  S: I'm here. That's about it.    Currently in Pain? No/denies            [facility] OT Assessment - 12/26/15 1027    Assessment   Diagnosis Left shoulder weakness   Precautions   Precautions None   Precaution Comments Is unable to lay supine.    Coordination   Box and Blocks Pt transferred all blocks: 3'                   OT Treatments/Exercises (OP) - 12/26/15 1028    Exercises   Exercises Shoulder   Shoulder  Exercises: Seated   Extension Theraband;20 reps   Theraband Level (Shoulder Extension) Level 4 (Blue)   Retraction Theraband;20 reps   Theraband Level (Shoulder Retraction) Level 4 (Blue)   Row Theraband;20 reps   Theraband Level (Shoulder Row) Level 4 (Blue)   Protraction AAROM;12 reps   Horizontal ABduction AAROM;12 reps   External Rotation AAROM;12 reps;Theraband;15 reps   Theraband Level (Shoulder External Rotation) Level 4 (Blue)   Internal Rotation AAROM;12 reps;Theraband;15 reps   Theraband Level (Shoulder Internal Rotation) Level 4 (Blue)   Flexion AAROM;12 reps   Flexion Limitations Able to achieve 50% range.   Abduction AAROM;12 reps   ABduction Limitations able to achieve 50% range.   Shoulder Exercises: ROM/Strengthening   Cybex Row 3 plate;15 reps   Wall Wash 2'   Over Head Lace 2'   Other ROM/Strengthening Exercises box and block mod difficulty 3'   Functional Reaching Activities   Mid Level Functional reaching task with cone stacking. Pt able to stack 11 cones in flexion and 13 cones in abduction with table at 36"   High Level Resistive clothespins, pt placed pins to 16 inch mark on  vertical pole with max difficulty                  OT Short Term Goals - 12/07/15 1200    OT SHORT TERM GOAL #1   Title Pt will be educated on and independent in HEP.    Time 6   Period Weeks   Status On-going   OT SHORT TERM GOAL #2   Title Pt will return to prior level of functioning and independence in daily and leisure tasks.    Time 6   Period Weeks   Status On-going   OT SHORT TERM GOAL #3   Title Pt will increase LUE A/ROM to [facility] to increase ability to complete functional tasks including dressing and meal preparation using LUE.    Time 6   Period Weeks   Status On-going   OT SHORT TERM GOAL #4   Title Pt will increase LUE strength to 4/5 to increase ability to perform transfer tasks and lift weighted objects using LUE.    Time 6   Period Weeks   Status  On-going                  Plan - 12/26/15 1205    Clinical Impression Statement A: Pt arrived late for session. Pt completed box and blocks at 3', increased functional reaching with clothespins by 1", unable to reach previous number of cones with cone stacking. Pt reports increased fatigue at beginning of session, as evidenced during exercises and functional reaching tasks.    Plan P: Discuss continuing therapy with pt or discharging. Continue working on functional reaching, review & update HEP        Problem List Patient Active Problem List   Diagnosis Date Noted  . Polydipsia 10/12/2015  . Bilateral shoulder pain 09/07/2015  . Cataracts, bilateral 01/30/2015  . Palpitations 07/15/2013  . Tobacco abuse 07/15/2013  . Insomnia, transient 07/15/2013  . Preventative health care 09/29/2012  . CHRONIC PAIN SYNDROME 06/26/2010  . ANEMIA 12/12/2008  . Hyperlipidemia 12/29/2006  . PARAPLEGIA 12/29/2006  . HYPERTENSION, BENIGN ESSENTIAL 12/29/2006  . GERD 12/29/2006    Guadelupe Sabin, OTR/L  [phone]  12/26/2015, 12:09 PM  [facility] [address] [place], [place], [address] Phone: [phone]   Fax:  [phone]  Name: Shawn [name]. MRN: [id number] Date of Birth: 1951-09-14

## 2015-12-28 ENCOUNTER — Encounter (HOSPITAL_COMMUNITY): Payer: Self-pay | Admitting: Occupational Therapy

## 2015-12-28 ENCOUNTER — Ambulatory Visit (HOSPITAL_COMMUNITY): Payer: BLUE CROSS/BLUE SHIELD | Admitting: Occupational Therapy

## 2015-12-28 DIAGNOSIS — Z658 Other specified problems related to psychosocial circumstances: Secondary | ICD-10-CM | POA: Diagnosis not present

## 2015-12-28 DIAGNOSIS — M6281 Muscle weakness (generalized): Secondary | ICD-10-CM | POA: Diagnosis not present

## 2015-12-28 DIAGNOSIS — Z789 Other specified health status: Secondary | ICD-10-CM

## 2015-12-28 NOTE — Therapy (Signed)
[facility] [address] [place], [place], [address] Phone: [phone]   Fax:  [phone]  Occupational Therapy Treatment  Patient Details  Name: Shawn [name]. MRN: [id number] Date of Birth: 03/19/1951 Referring Provider: Dr. Edmonia Lynch  Encounter Date: 12/28/2015      OT End of Session - 12/28/15 1223    Visit Number 13   Number of Visits 15   Date for OT Re-Evaluation 01/08/16   Authorization Type BCBS    OT Start Time 1115  pt arrived late for session   OT Stop Time 1147   OT Time Calculation (min) 32 min   Activity Tolerance Patient tolerated treatment well   Behavior During Therapy [facility] for tasks assessed/performed      Past Medical History  Diagnosis Date  . Substance abuse     alcohol  . Hyperlipidemia   . Hypertension   . GERD (gastroesophageal reflux disease)   . Allergy   . Paraplegia ([place])     secondary to [place]  . C. difficile colitis 11/2008  . Decubitus ulcer 1999    excision of right ischial pressure sore with flap reconstruction done by Dr. Denese Killings 10/31/2008  . Perineal abscess     Past Surgical History  Procedure Laterality Date  . Spine surgery    . Bone biopsy  11/2007    negative for organisms  . Sacral wound flap  2002    done by Dr. Stephanie Coup    There were no vitals filed for this visit.  Visit Diagnosis:  Muscle weakness of left upper extremity  Decreased independence with activities of daily living      Subjective Assessment - 12/28/15 1114    Subjective  S: My right shoulder is hurting me today.    Currently in Pain? No/denies            [facility] OT Assessment - 12/28/15 1114    Assessment   Diagnosis Left shoulder weakness   Precautions   Precautions None   Precaution Comments Is unable to lay supine.                   OT Treatments/Exercises (OP) - 12/28/15 1119    Exercises   Exercises Shoulder   Shoulder Exercises: Seated   Extension Theraband;20  reps   Theraband Level (Shoulder Extension) Level 4 (Blue)   Retraction Theraband;20 reps   Theraband Level (Shoulder Retraction) Level 4 (Blue)   Row Theraband;20 reps   Theraband Level (Shoulder Row) Level 4 (Blue)   Protraction AAROM;15 reps   Horizontal ABduction AAROM;15 reps   External Rotation AAROM;15 reps   Internal Rotation AAROM;15 reps   Flexion AAROM;15 reps   Flexion Limitations Able to achieve 50% range.   Abduction AAROM;15 reps   ABduction Limitations able to achieve 50% range.   Shoulder Exercises: ROM/Strengthening   Cybex Row 3 plate;20 reps   Wall Wash 2'   Functional Reaching Activities   Mid Level Functional reaching with cone stacking-pt able to stack 14 cones in flexion, 16 cones in abduction with table at 36". Pt able to complete with improved form this session.    High Level Resistive clothespins-pt completed first this session, pt able to reach to 15" mark with improved form from previous session.                    OT Short Term Goals - 12/07/15 1200    OT SHORT TERM GOAL #  1   Title Pt will be educated on and independent in [place].    Time 6   Period Weeks   Status On-going   OT SHORT TERM GOAL #2   Title Pt will return to prior level of functioning and independence in daily and leisure tasks.    Time 6   Period Weeks   Status On-going   OT SHORT TERM GOAL #3   Title Pt will increase LUE A/ROM to [facility] to increase ability to complete functional tasks including dressing and meal preparation using LUE.    Time 6   Period Weeks   Status On-going   OT SHORT TERM GOAL #4   Title Pt will increase LUE strength to 4/5 to increase ability to perform transfer tasks and lift weighted objects using LUE.    Time 6   Period Weeks   Status On-going                  Plan - 12/28/15 1224    Clinical Impression Statement A: Pt arrived late for session. Pt completed functional reaching with improved form and reach height compared to previous  session. Pt demonstrates increased independence in AA/ROM exercises and was able to complete with intermittent verbal cuing for form. Pt will complete 2 additional sessions and discharge at reassessment.    Plan P: Continue working towards independence in exercises looking to discharge on 1/27. Box and blocks        Problem List Patient Active Problem List   Diagnosis Date Noted  . Polydipsia 10/12/2015  . Bilateral shoulder pain 09/07/2015  . Cataracts, bilateral 01/30/2015  . Palpitations 07/15/2013  . Tobacco abuse 07/15/2013  . Insomnia, transient 07/15/2013  . Preventative health care 09/29/2012  . CHRONIC PAIN SYNDROME 06/26/2010  . ANEMIA 12/12/2008  . Hyperlipidemia 12/29/2006  . PARAPLEGIA 12/29/2006  . HYPERTENSION, BENIGN ESSENTIAL 12/29/2006  . GERD 12/29/2006    Guadelupe Sabin, OTR/L  [phone]  12/28/2015, 12:27 PM  [facility] [address] [place], [place], [address] Phone: [phone]   Fax:  [phone]  Name: Biko Rosenstein. MRN: [id number] Date of Birth: August 24, 1951

## 2016-01-01 ENCOUNTER — Encounter (HOSPITAL_COMMUNITY): Payer: Self-pay

## 2016-01-01 ENCOUNTER — Ambulatory Visit (HOSPITAL_COMMUNITY): Payer: BLUE CROSS/BLUE SHIELD

## 2016-01-01 DIAGNOSIS — M6281 Muscle weakness (generalized): Secondary | ICD-10-CM

## 2016-01-01 DIAGNOSIS — Z658 Other specified problems related to psychosocial circumstances: Secondary | ICD-10-CM | POA: Diagnosis not present

## 2016-01-01 NOTE — Patient Instructions (Signed)
ELASTIC BAND SHOULDER FLEXION  While holding an elastic band at your side, draw up your arm up in front of you keeping your elbow straight. 15X 1 set   ELASTIC BAND SHOULDER ADDUCTION  While holding an elastic band away from your side, pull the band towards your side. Keep your elbow straight.  15X 1 set   ELASTIC BAND - HORIZONTAL ABDUCTION  Start by holding an elastic band or tubing with your arm out-stretched in front of you and across your body towards the opposite side.  Next, pull the elastic band or cord horizontally and outward as shown.   Your elbow should be straight or slightly bent the entire time.   15X 1 set    ELASTIC BAND SHOULDER EXTERNAL ROTATION  While holding an elastic band at your side with your elbow bent, start with your hand near your stomach and then pull the band away. Keep your elbow at your side the entire time. 15X 1 set

## 2016-01-01 NOTE — Therapy (Signed)
[facility] [address] [place], [place], [address] Phone: [phone]   Fax:  [phone]  Occupational Therapy Treatment  Patient Details  Name: Shawn [name]. MRN: [id number] Date of Birth: 12/21/1950 Referring Provider: Dr. Edmonia Lynch  Encounter Date: 01/01/2016      OT End of Session - 01/01/16 1041    Visit Number 14   Number of Visits 15   Date for OT Re-Evaluation 01/08/16   Authorization Type BCBS    OT Start Time 0940  Pt arrived late to session   OT Stop Time 1015   OT Time Calculation (min) 35 min   Activity Tolerance Patient tolerated treatment well   Behavior During Therapy [facility] for tasks assessed/performed      Past Medical History  Diagnosis Date  . Substance abuse     alcohol  . Hyperlipidemia   . Hypertension   . GERD (gastroesophageal reflux disease)   . Allergy   . Paraplegia ([place])     secondary to [place]  . C. difficile colitis 11/2008  . Decubitus ulcer 1999    excision of right ischial pressure sore with flap reconstruction done by Dr. Denese Killings 10/31/2008  . Perineal abscess     Past Surgical History  Procedure Laterality Date  . Spine surgery    . Bone biopsy  11/2007    negative for organisms  . Sacral wound flap  2002    done by Dr. Stephanie Coup    There were no vitals filed for this visit.  Visit Diagnosis:  Muscle weakness of left upper extremity      Subjective Assessment - 01/01/16 1007    Subjective  S: I only have one more visit after today.   Currently in Pain? No/denies            [facility] OT Assessment - 01/01/16 1008    Assessment   Diagnosis Left shoulder weakness   Precautions   Precautions None   Precaution Comments Is unable to lay supine.    Coordination   Box and Blocks Pt transferred all blocks: 2'54"                  OT Treatments/Exercises (OP) - 01/01/16 1008    Exercises   Exercises Shoulder   Shoulder Exercises: ROM/Strengthening    Cybex Row 15 reps  4 plate   Over Head Lace 2'                OT Education - 01/01/16 1041    Education provided Yes   Education Details theraband strengthening exercises    Person(s) Educated Patient   Methods Explanation;Demonstration;Verbal cues;Handout   Comprehension Returned demonstration;Verbalized understanding          OT Short Term Goals - 12/07/15 1200    OT SHORT TERM GOAL #1   Title Pt will be educated on and independent in HEP.    Time 6   Period Weeks   Status On-going   OT SHORT TERM GOAL #2   Title Pt will return to prior level of functioning and independence in daily and leisure tasks.    Time 6   Period Weeks   Status On-going   OT SHORT TERM GOAL #3   Title Pt will increase LUE A/ROM to [facility] to increase ability to complete functional tasks including dressing and meal preparation using LUE.    Time 6   Period Weeks   Status On-going   OT SHORT  TERM GOAL #4   Title Pt will increase LUE strength to 4/5 to increase ability to perform transfer tasks and lift weighted objects using LUE.    Time 6   Period Weeks   Status On-going                  Plan - 01/01/16 1042    Clinical Impression Statement A: Updated HEP with patient requiring min VC for form and technique. Increased weight with cybex row. Pt completed box and block with less time this session.   Plan P: Reassess and discharge. Follow up on updated HEP.        Problem List Patient Active Problem List   Diagnosis Date Noted  . Polydipsia 10/12/2015  . Bilateral shoulder pain 09/07/2015  . Cataracts, bilateral 01/30/2015  . Palpitations 07/15/2013  . Tobacco abuse 07/15/2013  . Insomnia, transient 07/15/2013  . Preventative health care 09/29/2012  . CHRONIC PAIN SYNDROME 06/26/2010  . ANEMIA 12/12/2008  . Hyperlipidemia 12/29/2006  . PARAPLEGIA 12/29/2006  . HYPERTENSION, BENIGN ESSENTIAL 12/29/2006  . GERD 12/29/2006    Ailene Ravel, OTR/L,CBIS   [phone]  01/01/2016, 10:43 AM  [facility] [address] [place], [place], [address] Phone: [phone]   Fax:  [phone]  Name: Shawn [name]. MRN: [id number] Date of Birth: 05-04-1951

## 2016-01-04 ENCOUNTER — Ambulatory Visit (HOSPITAL_COMMUNITY): Payer: BLUE CROSS/BLUE SHIELD | Admitting: Specialist

## 2016-01-04 DIAGNOSIS — Z789 Other specified health status: Secondary | ICD-10-CM

## 2016-01-04 DIAGNOSIS — M6281 Muscle weakness (generalized): Secondary | ICD-10-CM | POA: Diagnosis not present

## 2016-01-04 DIAGNOSIS — Z658 Other specified problems related to psychosocial circumstances: Secondary | ICD-10-CM

## 2016-01-04 NOTE — Therapy (Signed)
[facility] [address] [place], [place], [address] Phone: [phone]   Fax:  [phone]  Occupational Therapy Treatment  Patient Details  Name: Shawn [name]. MRN: [id number] Date of Birth: 02/07/1951 Referring Provider: Dr. Edmonia Lynch  Encounter Date: 01/04/2016      OT End of Session - 01/04/16 1421    Visit Number 15   Number of Visits 15   Date for OT Re-Evaluation 01/08/16   Authorization Type BC[employer]    OT Start Time 1355  discharge visit   OT Stop Time 1425   OT Time Calculation (min) 30 min   Activity Tolerance Patient tolerated treatment well   Behavior During Therapy [facility] for tasks assessed/performed      Past Medical History  Diagnosis Date  . Substance abuse     alcohol  . Hyperlipidemia   . Hypertension   . GERD (gastroesophageal reflux disease)   . Allergy   . Paraplegia ([place])     secondary to [place]  . C. difficile colitis 11/2008  . Decubitus ulcer 1999    excision of right ischial pressure sore with flap reconstruction done by Dr. Denese Killings 10/31/2008  . Perineal abscess     Past Surgical History  Procedure Laterality Date  . Spine surgery    . Bone biopsy  11/2007    negative for organisms  . Sacral wound flap  2002    done by Dr. Stephanie Coup    There were no vitals filed for this visit.  Visit Diagnosis:  Muscle weakness of left upper extremity  Decreased independence with activities of daily living      Subjective Assessment - 01/04/16 1420    Subjective  S:  I think I am ready to be done with therapy.  I have done all I can do.   Special Tests FOTO improved from 60/100 to 71/100   Currently in Pain? No/denies   Pain Score 0-No pain            [facility] OT Assessment - 01/04/16 0001    Assessment   Diagnosis Left shoulder weakness   Precautions   Precautions None   Precaution Comments Is unable to lay supine.    ADL   ADL comments Patient reports increased independence and ease  with dressing, transferring, reaching overhead, and the refrigator.  Patient does not have pain and is pleased with his progress.    Palpation   Palpation comment trace fascial restrictions in left shoulder region    AROM   Left Shoulder Flexion 88 Degrees  76   Left Shoulder ABduction 84 Degrees  86   Left Shoulder Internal Rotation 85 Degrees  70   Left Shoulder External Rotation 74 Degrees  65   PROM   Overall PROM Comments P/ROM is WNL   Strength   Left Shoulder Flexion 4+/5  3-/5   Left Shoulder ABduction 4+/5  3-/5   Left Shoulder Internal Rotation --  4+/5   Left Shoulder External Rotation 4+/5  4+/5                  OT Treatments/Exercises (OP) - 01/04/16 0001    Shoulder Exercises: ROM/Strengthening   Cybex Row 3.5 plate;25 reps                OT Education - 01/04/16 1412    Education provided Yes   Education Details reviewed present HEP and decided it was a good challenge and he would  continue to complete daily   Person(s) Educated Patient   Methods Explanation   Comprehension Verbalized understanding          OT Short Term Goals - Feb 01, 2016 1411    OT SHORT TERM GOAL #1   Title Pt will be educated on and independent in HEP.    Time 6   Period Weeks   Status Achieved   OT SHORT TERM GOAL #2   Title Pt will return to prior level of functioning and independence in daily and leisure tasks.    Time 6   Period Weeks   Status Achieved   OT SHORT TERM GOAL #3   Title Pt will increase LUE A/ROM to [facility] to increase ability to complete functional tasks including dressing and meal preparation using LUE.    Time 6   Period Weeks   Status Achieved   OT SHORT TERM GOAL #4   Title Pt will increase LUE strength to 4/5 to increase ability to perform transfer tasks and lift weighted objects using LUE.    Time 6   Period Weeks   Status Achieved                  Plan - 2016-02-01 1422    Clinical Impression Statement A: Patient  demonstrated significant incease in his A/ROM and strength this date.  Functionally, he is able to do all activities that he desires to do and was previously having difficulties completing.   Patient has met all occupational therapy goals as outlined on his  treatment plan.  He is pleased with his current level of independence and feels ready for discharge.    Plan P:  DC from skilled OT intervention this date.            G-Codes - 2016/02/01 1424    Functional Assessment Tool Used FOTO scored 71/100 31% impaired    Functional Limitation Carrying, moving and handling objects   Carrying, Moving and Handling Objects Goal Status (D7824) At least 20 percent but less than 40 percent impaired, limited or restricted   Carrying, Moving and Handling Objects Discharge Status [phone]) At least 20 percent but less than 40 percent impaired, limited or restricted      Problem List Patient Active Problem List   Diagnosis Date Noted  . Polydipsia 10/12/2015  . Bilateral shoulder pain 09/07/2015  . Cataracts, bilateral 01/30/2015  . Palpitations 07/15/2013  . Tobacco abuse 07/15/2013  . Insomnia, transient 07/15/2013  . Preventative health care 09/29/2012  . CHRONIC PAIN SYNDROME 06/26/2010  . ANEMIA 12/12/2008  . Hyperlipidemia 12/29/2006  . PARAPLEGIA 12/29/2006  . HYPERTENSION, BENIGN ESSENTIAL 12/29/2006  . GERD 12/29/2006    Vangie Bicker, OTR/L [phone]  2016-02-01, 2:25 PM   OCCUPATIONAL THERAPY DISCHARGE SUMMARY  Visits from Start of Care: 15  Current functional level related to goals / functional outcomes: FOTO score improved from 60% to 71% independent.  Patient is able to complete all desired activities at home.     Remaining deficits: N/A   Education / Equipment: Patient to continue theraband exercises for scapular strengthening and will complete household cleaning and yardwork to build functional strength.  Plan: Patient agrees to discharge.  Patient goals were  met. Patient is being discharged due to meeting the stated rehab goals.  ?????   Vangie Bicker, OTR/L [place] [address] [place], [place], [address] Phone: [phone]  Fax:  [phone]  Name: Shawn [name]. MRN: [id number] Date of Birth: 18-Jan-1951

## 2016-01-05 ENCOUNTER — Encounter (HOSPITAL_COMMUNITY): Payer: Self-pay | Admitting: Occupational Therapy

## 2016-01-08 DIAGNOSIS — N139 Obstructive and reflux uropathy, unspecified: Secondary | ICD-10-CM | POA: Diagnosis not present

## 2016-02-12 ENCOUNTER — Encounter (HOSPITAL_COMMUNITY): Payer: Self-pay

## 2016-03-06 DIAGNOSIS — N139 Obstructive and reflux uropathy, unspecified: Secondary | ICD-10-CM | POA: Diagnosis not present

## 2016-04-02 ENCOUNTER — Other Ambulatory Visit: Payer: Self-pay | Admitting: Internal Medicine

## 2016-04-02 DIAGNOSIS — G894 Chronic pain syndrome: Secondary | ICD-10-CM

## 2016-04-02 NOTE — Telephone Encounter (Signed)
Last filled 3/30 Next appt 5/10 Last uds 10/2014

## 2016-04-02 NOTE — Telephone Encounter (Signed)
Needs pain med refills

## 2016-04-03 MED ORDER — OXYCODONE-ACETAMINOPHEN 10-325 MG PO TABS: 1.0000 | ORAL_TABLET | ORAL | Status: DC | PRN

## 2016-04-03 MED ORDER — MORPHINE SULFATE ER 15 MG PO TBCR: 15.0000 mg | EXTENDED_RELEASE_TABLET | Freq: Two times a day (BID) | ORAL | Status: DC

## 2016-04-03 NOTE — Telephone Encounter (Signed)
Will fill 1 month Rx to get patient to 5/10 appointment.

## 2016-04-03 NOTE — Telephone Encounter (Signed)
Message left on recorder that rx ready for pickup.Despina Hidden Cassady4/26/20173:13 PM

## 2016-04-17 ENCOUNTER — Encounter: Payer: Self-pay | Admitting: Internal Medicine

## 2016-04-17 ENCOUNTER — Ambulatory Visit (INDEPENDENT_AMBULATORY_CARE_PROVIDER_SITE_OTHER): Payer: BLUE CROSS/BLUE SHIELD | Admitting: Internal Medicine

## 2016-04-17 VITALS — Temp 98.2°F

## 2016-04-17 DIAGNOSIS — I10 Essential (primary) hypertension: Secondary | ICD-10-CM

## 2016-04-17 DIAGNOSIS — M1711 Unilateral primary osteoarthritis, right knee: Secondary | ICD-10-CM

## 2016-04-17 DIAGNOSIS — E785 Hyperlipidemia, unspecified: Secondary | ICD-10-CM

## 2016-04-17 DIAGNOSIS — F5102 Adjustment insomnia: Secondary | ICD-10-CM

## 2016-04-17 DIAGNOSIS — F17210 Nicotine dependence, cigarettes, uncomplicated: Secondary | ICD-10-CM

## 2016-04-17 DIAGNOSIS — G894 Chronic pain syndrome: Secondary | ICD-10-CM | POA: Diagnosis not present

## 2016-04-17 DIAGNOSIS — Z79899 Other long term (current) drug therapy: Secondary | ICD-10-CM

## 2016-04-17 DIAGNOSIS — Z79891 Long term (current) use of opiate analgesic: Secondary | ICD-10-CM

## 2016-04-17 MED ORDER — OXYCODONE-ACETAMINOPHEN 10-325 MG PO TABS: 1.0000 | ORAL_TABLET | ORAL | Status: DC | PRN

## 2016-04-17 MED ORDER — ZOLPIDEM TARTRATE ER 6.25 MG PO TBCR: 6.2500 mg | EXTENDED_RELEASE_TABLET | Freq: Every evening | ORAL | Status: DC | PRN

## 2016-04-17 MED ORDER — MORPHINE SULFATE ER 15 MG PO TBCR: 15.0000 mg | EXTENDED_RELEASE_TABLET | Freq: Two times a day (BID) | ORAL | Status: DC

## 2016-04-17 MED ORDER — EZETIMIBE 10 MG PO TABS: 10.0000 mg | ORAL_TABLET | Freq: Every day | ORAL | Status: DC

## 2016-04-17 MED ORDER — DICLOFENAC SODIUM 1 % TD GEL: 2.0000 g | Freq: Four times a day (QID) | TRANSDERMAL | Status: DC

## 2016-04-17 MED ORDER — OXYCODONE-ACETAMINOPHEN 10-325 MG PO TABS
1.0000 | ORAL_TABLET | ORAL | Status: DC | PRN
Start: 2016-04-17 — End: 2016-04-17

## 2016-04-17 NOTE — Patient Instructions (Signed)
Thank you for coming in today.   Please follow up with [place] in 3 months.  Knee Injection A knee injection is a procedure to get medicine into your knee joint. Your health care provider puts a needle into the joint and injects medicine with an attached syringe. The injected medicine may relieve the pain, swelling, and stiffness of arthritis. The injected medicine may also help to lubricate and cushion your knee joint. You may need more than one injection. LET [facility] CARE PROVIDER KNOW ABOUT:  Any allergies you have.  All medicines you are taking, including vitamins, herbs, eye drops, creams, and over-the-counter medicines.  Previous problems you or members of your family have had with the use of anesthetics.  Any blood disorders you have.  Previous surgeries you have had.  Any medical conditions you may have. RISKS AND COMPLICATIONS Generally, this is a safe procedure. However, problems may occur, including:  Infection.  Bleeding.  Worsening symptoms.  Damage to the area around your knee.  Allergic reaction to any of the medicines.  Skin reactions from repeated injections. BEFORE THE PROCEDURE  Ask your health care provider about changing or stopping your regular medicines. This is especially important if you are taking diabetes medicines or blood thinners.  Plan to have someone take you home after the procedure. PROCEDURE  You will sit or lie down in a position for your knee to be treated.  The skin over your kneecap will be cleaned with a germ-killing solution (antiseptic).  You will be given a medicine that numbs the area (local anesthetic). You may feel some stinging.  After your knee becomes numb, you will have a second injection. This is the medicine. This needle is carefully placed between your kneecap and your knee. The medicine is injected into the joint space.  At the end of the procedure, the needle will be removed.  A bandage (dressing) may be placed  over the injection site. The procedure may vary among health care providers and hospitals. AFTER THE PROCEDURE  You may have to move your knee through its full range of motion. This helps to get all of the medicine into your joint space.  Your blood pressure, heart rate, breathing rate, and blood oxygen level will be monitored often until the medicines you were given have worn off.  You will be watched to make sure that you do not have a reaction to the injected medicine.   This information is not intended to replace advice given to you by your health care provider. Make sure you discuss any questions you have with your health care provider.   Document Released: 02/16/2007 Document Revised: 12/16/2014 Document Reviewed: 10/05/2014 Elsevier Interactive Patient Education [employer].

## 2016-04-17 NOTE — Progress Notes (Signed)
Patient ID: Shawn [name]., male   DOB: 06-May-1951, 65 y.o.   MRN: [id number]   Subjective:   Patient ID: Shawn [name]. male   DOB: [birth year] 65 y.o.   MRN: [id number]  HPI: Shawn [name]. is a 66 y.o. male with a past medical history listed below here today for follow up of his HTN, HLD, Chronic Knee Pain.   For details of today's visit and the status of his chronic medical issues please refer to the assessment and plan.   Past Medical History  Diagnosis Date  . Substance abuse     alcohol  . Hyperlipidemia   . Hypertension   . GERD (gastroesophageal reflux disease)   . Allergy   . Paraplegia ([place])     secondary to [place]  . C. difficile colitis 11/2008  . Decubitus ulcer 1999    excision of right ischial pressure sore with flap reconstruction done by Dr. Denese Killings 10/31/2008  . Perineal abscess    Current Outpatient Prescriptions  Medication Sig Dispense Refill  . amLODipine (NORVASC) 5 MG tablet Take 1 tablet (5 mg total) by mouth daily. 90 tablet 2  . buPROPion (WELLBUTRIN SR) 150 MG 12 hr tablet Take 1 tablet (150 mg total) by mouth 2 (two) times daily. 60 tablet 5  . diclofenac sodium (VOLTAREN) 1 % GEL Apply 2 g topically 4 (four) times daily. 1 Tube 2  . ezetimibe (ZETIA) 10 MG tablet Take 1 tablet (10 mg total) by mouth daily. 90 tablet 2  . hydrochlorothiazide (HYDRODIURIL) 25 MG tablet Take 1 tablet (25 mg total) by mouth daily. 90 tablet 3  . lansoprazole (PREVACID) 30 MG capsule TAKE 1 CAPSULE (30 MG TOTAL) BY MOUTH DAILY. 90 capsule 3  . lisinopril (PRINIVIL,ZESTRIL) 40 MG tablet TAKE 1 TABLET (40 MG TOTAL) BY MOUTH DAILY. 90 tablet 3  . morphine (MS CONTIN) 15 MG 12 hr tablet Take 1 tablet (15 mg total) by mouth every 12 (twelve) hours. Fill 30 days after previous Rx. 60 tablet 0  . Multiple Vitamin (MULTIVITAMIN WITH MINERALS) TABS Take 1 tablet by mouth daily.    . naloxone (NARCAN) 0.4 MG/ML injection Inject 1 mL (0.4 mg total) into the vein as needed.  1 mL 0  . oxyCODONE-acetaminophen (PERCOCET) 10-325 MG tablet Take 1 tablet by mouth every 4 (four) hours as needed for pain. Fill 30 days after previous Rx 120 tablet 0  . zolpidem (AMBIEN CR) 6.25 MG CR tablet Take 1 tablet (6.25 mg total) by mouth at bedtime as needed for sleep. 30 tablet 2   No current facility-administered medications for this visit.   Family History  Problem Relation Age of Onset  . Stroke Mother   . Coronary artery disease Father   . Coronary artery disease Paternal Uncle   . Coronary artery disease Paternal Aunt    Social History   Social History  . Marital Status: Married    Spouse Name: N/A  . Number of Children: 1  . Years of Education: N/A   Occupational History  . DISABILITY    Social History Main Topics  . Smoking status: Current Every Day Smoker -- 0.50 packs/day for 40 years    Types: Cigarettes    Last Attempt to Quit: 08/09/2008  . Smokeless tobacco: Never Used     Comment: 1 PACK 2 DAYS  . Alcohol Use: No  . Drug Use: No  . Sexual Activity: Not Asked   Other Topics Concern  .  None   Social History Narrative   Married, disabled, medicaid, functions independently at home, paraplegic      Family hx of : hypertension, diabetes, kidney disease, and other cancer   Review of Systems: Eyes: Negative for blurred vision.  Gastrointestinal: Negative for abdominal pain, diarrhea and constipation.  Musculoskeletal: Positive for joint pain. Negative for myalgias.  Neurological: Negative for headaches.   Objective:  Physical Exam: Filed Vitals:   04/17/16 1520  Temp: 98.2 F (36.8 C)  TempSrc: Oral  SpO2: 100%   GENERAL- alert, co-operative, sitting in wheelchair, not in any distress. Sitting in wheelchair. HEENT- Atraumatic, normocephalic CARDIAC- RRR, no murmurs, rubs or gallops. RESP- Moving equal volumes of air, and clear to auscultation bilaterally, no wheezes or crackles.   Assessment & Plan:  Case discussed with Dr. Eppie Gibson.  Please refer to Problem based charting for further details of today's visit.

## 2016-04-20 NOTE — Assessment & Plan Note (Signed)
Reports that he uses Ambien infrequently 3-4 times a month. Requesting refill today.  Plan Rx for ambien refilled today.

## 2016-04-20 NOTE — Assessment & Plan Note (Signed)
Patient reports he has been taking the MS Conitn 15 mg twice a day and the Percocet 10-325 2 times a day on average. Reports some days he does not take any Percocet but also has bad days where he needs 4-5 pills. Reports that with the weather being so rainy lately his pain has gotten worse. Says that he has gotten knee injections in the past which helped.   Average pain over the past week: 10/10 Pain interferes with enjoyment of life: 8/10 Pain interferes with general activity: 8/10  Patient has Narcan available.  Plan MS Contin 15 mg bid #60 x3 Percocet 10-325 mg #120 prn x3 UDS at next visit Will proceed with knee injection today  PROCEDURE NOTE  PROCEDURE: right knee joint steroid injection.  PREOPERATIVE DIAGNOSIS: Osteoarthritis of the right knee.  POSTOPERATIVE DIAGNOSIS: Osteoarthritis of the right knee.  PROCEDURE: The patient was apprised of the risks and the benefits of the procedure and informed consent was obtained, as witnessed by Dr Eppie Gibson. Time-out procedure was performed, with confirmation of the patient's name, date of birth, and correct identification of the right knee to be injected. The patient's knee was then marked at the appropriate site for injection placement. The knee was sterilely prepped with Betadine. A 40 mg (1 milliliter) solution of Kenalog was drawn up into a 5 mL syringe with 1 mL of 1% lidocaine. The patient was injected with a 27-gauge needle at the lateral aspect of his right flexed knee. There were no complications. The patient tolerated the procedure well. There was minimal bleeding. The patient was instructed to ice his knee upon leaving clinic and refrain from overuse over the next 3 days. The patient was instructed to go to the emergency room with any usual pain, swelling, or redness occurred in the injected area. The patient was given a followup appointment to evaluate response to the injection to his increased range of motion and reduction of  pain.  The procedure was supervised by attending physician, Dr. Eppie Gibson.

## 2016-04-20 NOTE — Assessment & Plan Note (Signed)
Tolerating the Ezetimibe well with no side effects.   Continue current medications.

## 2016-04-20 NOTE — Assessment & Plan Note (Signed)
BP Readings from Last 3 Encounters:  12/20/15 150/90  11/08/15 166/99  10/25/15 132/85    Lab Results  Component Value Date   NA 133* 03/22/2015   K 3.5 03/22/2015   CREATININE 0.58 03/22/2015   Assessment: BP 150/90 initially, improved to 130/100 on recheck. Patient currently on HCTZ 25 mg daily and Lisinopril 40 mg daily and Norvasc 5 mg daily. Started norvasc at last visit. BP improved from previous visit. Denies any headaches or vision changes.  Plan: Continue current medications. Consider increasing Norvasc to 10 mg at next visit if BP continues to be elevated.

## 2016-04-22 NOTE — Progress Notes (Signed)
I saw and evaluated the patient.  I personally confirmed the key portions of Dr. Shanna Cisco history and exam and reviewed pertinent patient test results.  The assessment, diagnosis, and plan were formulated together and I agree with the documentation in the resident's note.  If his blood pressure remains elevated at follow-up we will titrate up his calcium channel blocker dose.  I was present at the bedside for the entire procedure and the patient tolerated it well.

## 2016-06-12 ENCOUNTER — Ambulatory Visit (HOSPITAL_COMMUNITY): Payer: BLUE CROSS/BLUE SHIELD | Admitting: Occupational Therapy

## 2016-06-14 ENCOUNTER — Ambulatory Visit (HOSPITAL_COMMUNITY): Payer: BLUE CROSS/BLUE SHIELD | Attending: Internal Medicine | Admitting: Occupational Therapy

## 2016-06-14 ENCOUNTER — Encounter (HOSPITAL_COMMUNITY): Payer: Self-pay | Admitting: Occupational Therapy

## 2016-06-14 DIAGNOSIS — M6281 Muscle weakness (generalized): Secondary | ICD-10-CM | POA: Diagnosis not present

## 2016-06-14 DIAGNOSIS — G8221 Paraplegia, complete: Secondary | ICD-10-CM | POA: Diagnosis not present
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
## 2016-06-26 NOTE — Addendum Note (Signed)
Addended by: Guadelupe Sabin A on: 06/26/2016 01:16 PM   Modules accepted: Orders

## 2016-06-26 NOTE — Addendum Note (Signed)
Addended by: Guadelupe Sabin A on: 06/26/2016 01:53 PM   Modules accepted: Orders

## 2016-07-17 ENCOUNTER — Ambulatory Visit (INDEPENDENT_AMBULATORY_CARE_PROVIDER_SITE_OTHER): Payer: BLUE CROSS/BLUE SHIELD | Admitting: Internal Medicine

## 2016-07-17 VITALS — BP 145/84 | HR 63 | Temp 98.6°F | Ht 73.5 in | Wt 180.0 lb

## 2016-07-17 DIAGNOSIS — I10 Essential (primary) hypertension: Secondary | ICD-10-CM

## 2016-07-17 DIAGNOSIS — G894 Chronic pain syndrome: Secondary | ICD-10-CM

## 2016-07-17 DIAGNOSIS — G8220 Paraplegia, unspecified: Secondary | ICD-10-CM

## 2016-07-17 DIAGNOSIS — F17210 Nicotine dependence, cigarettes, uncomplicated: Secondary | ICD-10-CM

## 2016-07-17 MED ORDER — AMLODIPINE BESYLATE 10 MG PO TABS: 5.0000 mg | ORAL_TABLET | Freq: Every day | ORAL | 2 refills | Status: DC

## 2016-07-17 MED ORDER — OXYCODONE-ACETAMINOPHEN 10-325 MG PO TABS: 1.0000 | ORAL_TABLET | ORAL | 0 refills | Status: DC | PRN
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
## 2016-07-17 NOTE — Patient Instructions (Signed)
Shawn [name],   Thank you for coming in today.   Please stop taking the Morphine. I am going to increase the number of your Oxycodone prescription from 120 pills to 150 pills. You can take up to 5 pills a day.   Your blood pressure was still a little up today so I am going to increase your Norvasc (Amlodipine) dose to 10 mg daily. You can take 2 pills of your current 5 mg prescription until you get the next prescription filled.  I would like to see you back in 3 months.

## 2016-07-17 NOTE — Progress Notes (Deleted)
   CC: ***  HPI:  [name] is a 65 y.o.   Past Medical History:  Diagnosis Date  . Allergy   . C. difficile colitis 11/2008  . Decubitus ulcer 1999   excision of right ischial pressure sore with flap reconstruction done by Dr. Denese Killings 10/31/2008  . GERD (gastroesophageal reflux disease)   . Hyperlipidemia   . Hypertension   . Paraplegia ([place])    secondary to [place]  . Perineal abscess   . Substance abuse    alcohol    Review of Systems:  ***  Physical Exam:  Vitals:   07/17/16 1407  BP: (!) 156/87  Pulse: 65  Temp: 98.6 F (37 C)  TempSrc: Oral  SpO2: 100%  Weight: 180 lb (81.6 kg)  Height: 6' 1.5" (1.867 m)   ***  Assessment & Plan:   See Encounters Tab for problem based charting.  Patient {GC/GE:3044014::"discussed with","seen with"} Dr. {NAMES:[id number]::"Butcher","Granfortuna","E. Hoffman","Klima","Mullen","Narendra","Vincent"}

## 2016-07-21 NOTE — Assessment & Plan Note (Signed)
Patient reports he has been taking oxycodone 10 mg 4 times daily. Does report occasionally taking it 5 times in a day if pain is uncontrolled. Admits that he has run out early in the past 3 months. Reports that his MS Contin 15 mg bid does not help. Does state that he only takes it daily.   Average pain over the past week: 7-8/10 Pain interferes with enjoyment of life: 8-9/10 Pain interferes with general activity: 5-6/10  Reports pain was better controlled in the past on oxycodone alone.  Plan Will d/c ms contin today as patient reports it not as effective and not taking it consistently.  Patient agreeable to stopping MS Contin and increasing his oxycodone 10 mg from 4 times daily to 5 times daily as needed.   Will fill Rx for Oxycodone 10-325 mg #150 today (previously #120). RTC in 3 months (no earlier appointments available). If stable and no further changes can sign new pain contract at that time.   Checking UDS today. [place] database with appropriate refills.
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
## 2016-07-21 NOTE — Assessment & Plan Note (Signed)
BP Readings from Last 3 Encounters:  07/17/16 (!) 145/84  12/20/15 (!) 150/90  11/08/15 (!) 166/99    Lab Results  Component Value Date   NA 133 (L) 03/22/2015   K 3.5 03/22/2015   CREATININE 0.58 03/22/2015    Assessment: BP still mildly elevated at 145/84 today. Patient currently on HCTZ 25 mg daily and Lisinopril 40 mg daily and Norvasc 5 mg daily. Denies any headaches, vision changes, dizziness/lightheadedness. Reports compliance with his medications.   Plan: Increase your Norvasc (Amlodipine) dose to 10 mg daily. Continue HCTZ and Lisinopril as above.  RTC in 3 months for recheck.

## 2016-07-22 NOTE — Progress Notes (Signed)
Internal Medicine Clinic Attending  Case discussed with Dr. Boswell soon after the resident saw the patient.  We reviewed the resident's history and exam and pertinent patient test results.  I agree with the assessment, diagnosis, and plan of care documented in the resident's note. 

## 2016-07-26 LAB — TOXASSURE SELECT,+ANTIDEPR,UR: PDF: 0

## 2016-08-06 ENCOUNTER — Telehealth: Payer: Self-pay | Admitting: *Deleted

## 2016-08-06 NOTE — Telephone Encounter (Signed)
Call from [place], personal health nurse, from [employer] (insurance co). States she had talked to pt/wife yesterday about his medications and management. Pt stated having w/ new cholesterol medication (on Zetia but not new). So, she wanted a list of his meds so she could review and make sure he's taking them correctly. States wife stated she did go to last appt w/her husband so she was unsure about any changes. I told her I needed consent from pt (d.t HIPPA) I called pt who states he talked to [facility] yesterday and gave verbal consent to give her that information can be given about his medications. Fax# [phone]. [place] # [phone] ext [phone] Copy of med list faxed.

## 2016-08-14 ENCOUNTER — Telehealth: Payer: Self-pay | Admitting: *Deleted

## 2016-08-14 NOTE — Telephone Encounter (Signed)
Call from [employer] form [employer] - requesting pt's treatment plan. States she has faxed forms; did not see any forms in doctor's box. Also request pt's consent to release information. States she will re-fax her request forms.

## 2016-09-01 ENCOUNTER — Other Ambulatory Visit: Payer: Self-pay | Admitting: Internal Medicine

## 2016-09-07 ENCOUNTER — Other Ambulatory Visit: Payer: Self-pay | Admitting: Internal Medicine

## 2016-10-10 ENCOUNTER — Telehealth: Payer: Self-pay | Admitting: *Deleted

## 2016-10-10 NOTE — Telephone Encounter (Signed)
[id number] ext [phone] [place] case manager 1) pt is telling her that he is to only take 1/2 of a 10mg  norvasc, at last appt pt was to increase his dose from 5 to 10mg . Is this correct?  He also states that his bp med changes to less in the cold months because his bp drops when it is cold because of his paraplegia 2) states he is to be taking 2 zetia= total 40mg  a day to get his cholesterol under control I can not find any notes, please advise Pt has upcoming appt

## 2016-10-10 NOTE — Telephone Encounter (Signed)
1) I wanted to increase his Norvasc to 10 mg daily. If he is having low pressures or symptoms he can continue the 5 mg dose, otherwise I would like it to be the 10 mg dose.  2) The Zetia is just 10 mg daily

## 2016-10-11 NOTE — Telephone Encounter (Signed)
Tried to call pt, lm for rtc 

## 2016-10-14 NOTE — Telephone Encounter (Signed)
Pt's wife called back this am, went over medications with her, bp continues to be in 140/80 range, confirmed next appt and that she will accompany pt

## 2016-10-21 DIAGNOSIS — N312 Flaccid neuropathic bladder, not elsewhere classified: Secondary | ICD-10-CM | POA: Diagnosis not present
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
## 2016-10-22 ENCOUNTER — Telehealth: Payer: Self-pay | Admitting: Internal Medicine

## 2016-10-22 NOTE — Telephone Encounter (Signed)
APT. REMINDER CALL, LMTCB °

## 2016-10-23 ENCOUNTER — Ambulatory Visit (INDEPENDENT_AMBULATORY_CARE_PROVIDER_SITE_OTHER): Payer: BLUE CROSS/BLUE SHIELD | Admitting: Internal Medicine

## 2016-10-23 ENCOUNTER — Encounter: Payer: Self-pay | Admitting: Internal Medicine

## 2016-10-23 VITALS — BP 102/72 | HR 67 | Temp 97.3°F | Ht 73.5 in

## 2016-10-23 DIAGNOSIS — Z79899 Other long term (current) drug therapy: Secondary | ICD-10-CM

## 2016-10-23 DIAGNOSIS — Z23 Encounter for immunization: Secondary | ICD-10-CM | POA: Diagnosis not present

## 2016-10-23 DIAGNOSIS — R5382 Chronic fatigue, unspecified: Secondary | ICD-10-CM | POA: Insufficient documentation

## 2016-10-23 DIAGNOSIS — R5383 Other fatigue: Secondary | ICD-10-CM

## 2016-10-23 DIAGNOSIS — I10 Essential (primary) hypertension: Secondary | ICD-10-CM

## 2016-10-23 DIAGNOSIS — F17210 Nicotine dependence, cigarettes, uncomplicated: Secondary | ICD-10-CM

## 2016-10-23 DIAGNOSIS — G894 Chronic pain syndrome: Secondary | ICD-10-CM | POA: Diagnosis not present

## 2016-10-23 DIAGNOSIS — Z0000 Encounter for general adult medical examination without abnormal findings: Secondary | ICD-10-CM

## 2016-10-23 DIAGNOSIS — Z79891 Long term (current) use of opiate analgesic: Secondary | ICD-10-CM

## 2016-10-23 DIAGNOSIS — Z720 Tobacco use: Secondary | ICD-10-CM

## 2016-10-23 DIAGNOSIS — M25561 Pain in right knee: Secondary | ICD-10-CM

## 2016-10-23 DIAGNOSIS — Z8781 Personal history of (healed) traumatic fracture: Secondary | ICD-10-CM

## 2016-10-23 DIAGNOSIS — E785 Hyperlipidemia, unspecified: Secondary | ICD-10-CM

## 2016-10-23 MED ORDER — OXYCODONE-ACETAMINOPHEN 10-325 MG PO TABS: 1.0000 | ORAL_TABLET | ORAL | 0 refills | Status: DC | PRN

## 2016-10-23 NOTE — Addendum Note (Signed)
Addended by: Sander Nephew F on: 10/23/2016 02:29 PM   Modules accepted: Orders
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
## 2016-10-23 NOTE — Assessment & Plan Note (Signed)
Statin intolerant secondary to uncontrolled diarrhea with multiple statins. Just on Ezetimibe now. Last Lipid panel 10/2015 with TC 133, TG 216, HDL 24 and LDL 66.   Plan Will re-check lipid panel today

## 2016-10-23 NOTE — Assessment & Plan Note (Signed)
Has been feeling tried and fatigued lately. Appetite is good. Reports some weight gain. PHQ2 negative. Denies any depression. Says he took B12 shots in the past and felt like it helped with his energy. Last B12 level in 2014 was 663. TSH in 2015 was wnl.   Plan Check B12 and TSH today Monitor closely for depression as he is risk risk for developing depression given his paraplegia and chronic pain

## 2016-10-23 NOTE — Patient Instructions (Addendum)
Shawn [name],   I am going to refer you back to the Physical Medicine and Rehabilitation group to see if they can help [place] with your pain symptoms.   I would like to see you back in 3 months.

## 2016-10-23 NOTE — Assessment & Plan Note (Signed)
Flu shot given today.  Had PPSV23 in 2009, Prevnar 13 in 10/2015. Due for PPSV23 today, given today.

## 2016-10-23 NOTE — Assessment & Plan Note (Signed)
BP Readings from Last 3 Encounters:  10/23/16 102/72  07/17/16 (!) 145/84  12/20/15 (!) 150/90    Lab Results  Component Value Date   NA 133 (L) 03/22/2015   K 3.5 03/22/2015   CREATININE 0.58 03/22/2015    Assessment: Patient currently on HCTZ 25 mg daily and Lisinopril 40 mg daily and Norvasc 10 mg daily (increased from 5 mg 07/2016 visit). Checking BP at home. Doing better since increasing the amlodipine dose. BP today is 107/72. Denies any headaches, vision changes, dizziness/lightheadedness. Reports compliance with his medications.  Plan: Continue current medications Check BMP today.

## 2016-10-23 NOTE — Assessment & Plan Note (Signed)
Still smoking 1/2 PPD. Not interested in quitting.   Will continue to encourage cessation.

## 2016-10-24 NOTE — Progress Notes (Signed)
Internal Medicine Clinic Attending  Case discussed with Dr. Boswell at the time of the visit.  We reviewed the resident's history and exam and pertinent patient test results.  I agree with the assessment, diagnosis, and plan of care documented in the resident's note.  

## 2016-10-25 LAB — VITAMIN B12: Vitamin B-12: 464 pg/mL (ref 211–946)

## 2016-10-25 LAB — BMP8+ANION GAP
Anion Gap: 19 mmol/L — ABNORMAL HIGH (ref 10.0–18.0)
BUN/Creatinine Ratio: 11 (ref 10–24)
BUN: 6 mg/dL — AB (ref 8–27)
CALCIUM: 9.1 mg/dL (ref 8.6–10.2)
CHLORIDE: 89 mmol/L — AB (ref 96–106)
CO2: 21 mmol/L (ref 18–29)
CREATININE: 0.54 mg/dL — AB (ref 0.76–1.27)
GFR calc non Af Amer: 109 mL/min/{1.73_m2} (ref >59)
GFR, EST AFRICAN AMERICAN: 126 mL/min/{1.73_m2} (ref >59)
GLUCOSE: 98 mg/dL (ref 65–99)
Potassium: 4.4 mmol/L (ref 3.5–5.2)
Sodium: 129 mmol/L — ABNORMAL LOW (ref 134–144)

## 2016-10-25 LAB — LIPID PANEL
CHOLESTEROL TOTAL: 122 mg/dL (ref 100–199)
Chol/HDL Ratio: 4.5 ratio units (ref 0.0–5.0)
HDL: 27 mg/dL — AB (ref >39)
LDL CALC: 60 mg/dL (ref 0–99)
TRIGLYCERIDES: 175 mg/dL — AB (ref 0–149)
VLDL Cholesterol Cal: 35 mg/dL (ref 5–40)

## 2016-10-25 LAB — TSH: TSH: 0.758 u[IU]/mL (ref 0.450–4.500)

## 2016-11-06 ENCOUNTER — Encounter: Payer: Self-pay | Admitting: Internal Medicine

## 2016-11-13 ENCOUNTER — Other Ambulatory Visit: Payer: Self-pay | Admitting: Internal Medicine

## 2016-11-21 ENCOUNTER — Other Ambulatory Visit: Payer: Self-pay | Admitting: Internal Medicine

## 2016-12-06 ENCOUNTER — Encounter
Payer: BLUE CROSS/BLUE SHIELD | Attending: Physical Medicine & Rehabilitation | Admitting: Physical Medicine & Rehabilitation

## 2016-12-17 ENCOUNTER — Encounter: Payer: Self-pay | Admitting: Internal Medicine

## 2016-12-17 DIAGNOSIS — F1120 Opioid dependence, uncomplicated: Secondary | ICD-10-CM | POA: Insufficient documentation

## 2016-12-18 ENCOUNTER — Other Ambulatory Visit: Payer: Self-pay | Admitting: *Deleted

## 2016-12-18 ENCOUNTER — Other Ambulatory Visit: Payer: Self-pay

## 2016-12-18 DIAGNOSIS — F5102 Adjustment insomnia: Secondary | ICD-10-CM

## 2016-12-18 DIAGNOSIS — N312 Flaccid neuropathic bladder, not elsewhere classified: Secondary | ICD-10-CM | POA: Diagnosis not present

## 2016-12-18 NOTE — Telephone Encounter (Signed)
amLODipine (NORVASC) 10 MG tablet, zolpidem (AMBIEN CR) 6.25 MG CR tablet, refill request @ [facility]. Please call the pt wife back.

## 2016-12-19 MED ORDER — ZOLPIDEM TARTRATE ER 6.25 MG PO TBCR: 6.2500 mg | EXTENDED_RELEASE_TABLET | Freq: Every evening | ORAL | 2 refills | Status: DC | PRN

## 2016-12-19 MED ORDER — AMLODIPINE BESYLATE 10 MG PO TABS: 5.0000 mg | ORAL_TABLET | Freq: Every day | ORAL | 2 refills | Status: DC

## 2016-12-20 NOTE — Telephone Encounter (Signed)
Ambien rx called to [facility]. 

## 2016-12-20 NOTE — Telephone Encounter (Signed)
Refills done per Dr Charlynn Grimes.

## 2017-01-01 ENCOUNTER — Ambulatory Visit: Payer: BLUE CROSS/BLUE SHIELD | Admitting: Physical Medicine & Rehabilitation

## 2017-01-08 ENCOUNTER — Encounter
Payer: BLUE CROSS/BLUE SHIELD | Attending: Physical Medicine & Rehabilitation | Admitting: Physical Medicine & Rehabilitation

## 2017-01-08 ENCOUNTER — Encounter: Payer: Self-pay | Admitting: Physical Medicine & Rehabilitation

## 2017-01-08 VITALS — BP 198/115 | HR 76 | Resp 14

## 2017-01-08 DIAGNOSIS — M25561 Pain in right knee: Secondary | ICD-10-CM

## 2017-01-08 DIAGNOSIS — G479 Sleep disorder, unspecified: Secondary | ICD-10-CM | POA: Diagnosis not present

## 2017-01-08 DIAGNOSIS — G8929 Other chronic pain: Secondary | ICD-10-CM | POA: Insufficient documentation

## 2017-01-08 DIAGNOSIS — G894 Chronic pain syndrome: Secondary | ICD-10-CM | POA: Insufficient documentation

## 2017-01-08 DIAGNOSIS — Z79899 Other long term (current) drug therapy: Secondary | ICD-10-CM | POA: Diagnosis not present

## 2017-01-08 DIAGNOSIS — I169 Hypertensive crisis, unspecified: Secondary | ICD-10-CM

## 2017-01-08 DIAGNOSIS — Z5181 Encounter for therapeutic drug level monitoring: Secondary | ICD-10-CM | POA: Insufficient documentation

## 2017-01-08 DIAGNOSIS — R269 Unspecified abnormalities of gait and mobility: Secondary | ICD-10-CM

## 2017-01-08 DIAGNOSIS — G8220 Paraplegia, unspecified: Secondary | ICD-10-CM

## 2017-01-08 MED ORDER — DICLOFENAC SODIUM 1 % TD GEL: 2.0000 g | Freq: Four times a day (QID) | TRANSDERMAL | 1 refills | Status: DC

## 2017-01-08 MED ORDER — TRAMADOL HCL 50 MG PO TABS: 50.0000 mg | ORAL_TABLET | Freq: Four times a day (QID) | ORAL | 0 refills | Status: DC | PRN

## 2017-01-08 NOTE — Progress Notes (Signed)
Subjective:    Patient ID: Shawn [name]., male    DOB: 1951-11-16, 66 y.o.   MRN: [id number]  HPI 66 y/o male with pmh/psh of paraplegia, Etoh abuse, HTN, L-spine surgery, flap present with pain >> right knee. Present since ~1995.  Exacerbated since fracture in 2007. Stable since then.  Narcotics improve the pain.  Cold exacerbate the pain.  Achy.  Non-radiating.  He has sensation to his knees.  Intermittent.  Ice/heat do not help.  Denies falls. Pain makes things uncomfortable.  Pain Inventory Average Pain 6 Pain Right Now 10 My pain is intermittent, sharp and aching  In the last 24 hours, has pain interfered with the following? General activity 4 Relation with others 4 Enjoyment of life 0 What TIME of day is your pain at its worst? morning Sleep (in general) Fair  Pain is worse with: sitting Pain improves with: medication Relief from Meds: 6  Mobility do you drive?  no use a wheelchair needs help with transfers  Function disabled: date disabled 10/1972  Neuro/Psych bladder control problems  Prior Studies CT/MRI new visit  Physicians involved in your care new visit   Family History  Problem Relation Age of Onset  . Stroke Mother   . Coronary artery disease Father   . Coronary artery disease Paternal Uncle   . Coronary artery disease Paternal Aunt    Social History   Social History  . Marital status: Married    Spouse name: N/A  . Number of children: 1  . Years of education: N/A   Occupational History  . DISABILITY Unemployed   Social History Main Topics  . Smoking status: Current Every Day Smoker    Packs/day: 0.50    Years: 40.00    Types: Cigarettes    Last attempt to quit: 08/09/2008  . Smokeless tobacco: Never Used     Comment: 1 PACK 2 DAYS  . Alcohol use No  . Drug use: No  . Sexual activity: Not Asked   Other Topics Concern  . None   Social History Narrative   Married, disabled, medicaid, functions independently at home,  paraplegic      Family hx of : hypertension, diabetes, kidney disease, and other cancer   Past Surgical History:  Procedure Laterality Date  . BONE BIOPSY  11/2007   negative for organisms  . sacral wound flap  2002   done by Dr. Stephanie Coup  . SPINE SURGERY     Past Medical History:  Diagnosis Date  . Allergy   . C. difficile colitis 11/2008  . Decubitus ulcer 1999   excision of right ischial pressure sore with flap reconstruction done by Dr. Denese Killings 10/31/2008  . GERD (gastroesophageal reflux disease)   . Hyperlipidemia   . Hypertension   . Paraplegia ([place])    secondary to [place]  . Perineal abscess   . Substance abuse    alcohol   BP (!) 198/115   Pulse 76   Resp 14   SpO2 97%   Opioid Risk Score:   Fall Risk Score:  `1  Depression screen PHQ 2/9  Depression screen [facility] 2/9 01/08/2017 10/23/2016 11/08/2015 10/25/2015 10/25/2015 10/11/2015 09/25/2015  Decreased Interest 3 0 0 0 0 0 0  Down, Depressed, Hopeless 0 0 0 0 0 0 0  PHQ - 2 Score 3 0 0 0 0 0 0  Altered sleeping 3 - - - - - -  Tired, decreased energy 3 - - - - - -  Change in appetite 0 - - - - - -  Feeling bad or failure about yourself  0 - - - - - -  Trouble concentrating 1 - - - - - -  Moving slowly or fidgety/restless 0 - - - - - -  Suicidal thoughts 0 - - - - - -  PHQ-9 Score 10 - - - - - -  Difficult doing work/chores Somewhat difficult - - - - - -    Review of Systems  HENT: Negative.   Eyes: Negative.   Respiratory: Negative.   Cardiovascular: Positive for leg swelling.  Gastrointestinal: Negative.   Endocrine: Negative.   Genitourinary: Positive for difficulty urinating.  Musculoskeletal: Positive for arthralgias, myalgias and neck stiffness.  Skin: Negative.   Neurological: Positive for weakness and numbness.  Hematological: Negative.   Psychiatric/Behavioral: Negative.   All other systems reviewed and are negative.     Objective:   Physical Exam Gen: NAD. Vital signs reviewed HENT:  Normocephalic, Atraumatic Eyes: EOMI. No discharge.  Cardio: RRR. No JVD. Pulm: B/l clear to auscultation.  Effort normal Abd: Soft, BS+ MSK:  Gait nonambulatory.   No TTP.    Edema in right knee.  Neuro:   Sensation absent below b/l knee, WNL proximally  Reflexes absent in b/l LE  Strength  RLE HF 2/5, distally 0/5    LLEL HF/KE 2/5, distally 0/5Skin: Warm and Dry. Intact. Healed scars.     Assessment & Plan:  66 y/o male with pmh/psh of paraplegia, Etoh abuse, HTN, L-spine surgery, flap present with pain >> right knee.  1. Chronic mechanical low back pain with chronic pain syndrome  Xray 10/2006 reviewed, revealing significant fractures  Labs reviewed  Referral information reviewed  NCCSRS reviewed  UDS performed  Heat/Cold ineffective for patient  Will order Xray of right knee  Will order bracing  Will order Voltaren gel  Will order Tramadol 50mg  q6 PRN  Will consider Lidoderm patch in future  Will consider TENS  Will consider Cymbalta in futures  2. Gait abnormality  Cont wheelchair  3. Sleep disturbance  Variable  4. Hypertensive crisis  BP 198/115 today  Encouraged compliance with blood pressure medications, he states he missed his dose last night.  Encouraged to take medications ASAP  Follow up with PCP  5. Paraplegia  Being followed by PCP  Pt may follow if additional needs arise

## 2017-01-08 NOTE — Addendum Note (Signed)
Addended by: Geryl Rankins D on: 01/08/2017 03:03 PM   Modules accepted: Orders

## 2017-01-14 LAB — TOXASSURE SELECT,+ANTIDEPR,UR

## 2017-01-21 ENCOUNTER — Encounter: Payer: Self-pay | Admitting: Internal Medicine

## 2017-01-21 ENCOUNTER — Ambulatory Visit (INDEPENDENT_AMBULATORY_CARE_PROVIDER_SITE_OTHER): Payer: BLUE CROSS/BLUE SHIELD | Admitting: Internal Medicine

## 2017-01-21 ENCOUNTER — Ambulatory Visit (HOSPITAL_COMMUNITY)
Admission: RE | Admit: 2017-01-21 | Discharge: 2017-01-21 | Disposition: A | Discharge status: Home / Self Care | Payer: BLUE CROSS/BLUE SHIELD | Source: Ambulatory Visit | Attending: Physical Medicine & Rehabilitation | Admitting: Physical Medicine & Rehabilitation

## 2017-01-21 VITALS — BP 118/79 | HR 80 | Temp 97.7°F | Ht 73.5 in

## 2017-01-21 DIAGNOSIS — E871 Hypo-osmolality and hyponatremia: Secondary | ICD-10-CM

## 2017-01-21 DIAGNOSIS — G8929 Other chronic pain: Secondary | ICD-10-CM | POA: Diagnosis not present

## 2017-01-21 DIAGNOSIS — I70201 Unspecified atherosclerosis of native arteries of extremities, right leg: Secondary | ICD-10-CM | POA: Insufficient documentation

## 2017-01-21 DIAGNOSIS — F17210 Nicotine dependence, cigarettes, uncomplicated: Secondary | ICD-10-CM

## 2017-01-21 DIAGNOSIS — M8580 Other specified disorders of bone density and structure, unspecified site: Secondary | ICD-10-CM | POA: Diagnosis not present

## 2017-01-21 DIAGNOSIS — Z79899 Other long term (current) drug therapy: Secondary | ICD-10-CM | POA: Diagnosis not present

## 2017-01-21 DIAGNOSIS — M25561 Pain in right knee: Secondary | ICD-10-CM | POA: Diagnosis not present

## 2017-01-21 DIAGNOSIS — Z8781 Personal history of (healed) traumatic fracture: Secondary | ICD-10-CM | POA: Diagnosis not present

## 2017-01-21 DIAGNOSIS — G894 Chronic pain syndrome: Secondary | ICD-10-CM | POA: Insufficient documentation

## 2017-01-21 DIAGNOSIS — F1120 Opioid dependence, uncomplicated: Secondary | ICD-10-CM

## 2017-01-21 DIAGNOSIS — I10 Essential (primary) hypertension: Secondary | ICD-10-CM

## 2017-01-21 MED ORDER — AMLODIPINE BESYLATE 10 MG PO TABS: 10.0000 mg | ORAL_TABLET | Freq: Every day | ORAL | 2 refills | Status: DC

## 2017-01-21 NOTE — Progress Notes (Signed)
   CC: Medication refill for pain medications  HPI:  [name] is a 66 y.o. male with a past medical history listed below here today for follow up of his chronic pain and hypertension.   For details of today's visit and the status of his chronic medical issues please refer to the assessment and plan.  Past Medical History:  Diagnosis Date  . Allergy   . C. difficile colitis 11/2008  . Decubitus ulcer 1999   excision of right ischial pressure sore with flap reconstruction done by Dr. Denese [name] 10/31/2008  . GERD (gastroesophageal reflux disease)   . Hyperlipidemia   . Hypertension   . Paraplegia ([place])    secondary to [place]  . Perineal abscess   . Substance abuse    alcohol    Review of Systems:   See HPI  Physical Exam:  Vitals:   01/21/17 1330  BP: (!) 171/89  Pulse: 91  Temp: 97.7 F (36.5 C)  TempSrc: Oral  SpO2: 98%  Height: 6' 1.5" (1.867 m)   General: alert, well-developed, and cooperative to examination.  Lungs: normal respiratory effort, , normal breath sounds, no crackles, and no wheezes. Heart: normal rate, regular rhythm, no murmur Abdomen: soft, non-tender, normal bowel sounds  Assessment & Plan:   See Encounters Tab for problem based charting.  Patient discussed with Dr. Lynnae [name]

## 2017-01-21 NOTE — Patient Instructions (Addendum)
Mr. Venier,  I am glad your pain is doing better on the tramadol. Please follow up with Dr. Posey Pronto at your 2/28 visit. Since you are doing so well on the Tramadol, I do not believe we need to continue the Oxycodone at this time. Please follow up with me in about a month to make sure things are going well.

## 2017-01-26 DIAGNOSIS — E871 Hypo-osmolality and hyponatremia: Secondary | ICD-10-CM | POA: Insufficient documentation

## 2017-01-26 NOTE — Assessment & Plan Note (Signed)
Patient with mild chronic hyponatremia. Last BMET was 129. Appears to have been fluctuating from 129-134 for the past 2 years. Denies any symptoms today.  Assessment: Mild hyponatremia  Plan:  Ordered Urine Osm and Serum Osm as well as repeat BMET for confirmation. Unfortunately, patient left prior to getting labs drawn. Will get at next visit.

## 2017-01-26 NOTE — Assessment & Plan Note (Addendum)
Seen by PM&R on 1/31. UDS at that time was appropriate for his oxycodone. Re-ordered Xray of right knee which shows no acute abnormalities. He was started on tramadol and bracing at that time. He was also prescribed Tramadol 50 mg q6hr prn. He is already on Oxycodone 10 mg #150 pills/month from use. His MME was 75 and now he is taking 95 MME daily at his current dosing.  [place] database shows appropriate refills on his oxycodone with the additional tramadol prescription on 1/31.   He reports to me today that the Tramadol helps improve his pain much more than the Oxycodone. He says he has been using the Tramadol 3-4 times daily with occasional use of the Oxycodone once a day at most.   Assessment: Chronic Pain, improving  Plan:  Discussed that since the pain is improving with the Tramadol, we will discontinue any further Oxycodone prescriptions. He has follow up with PM&R later this month who can provide the Tramadol as they deem appropriate. Follow up with me in a month following PM&R visit.

## 2017-01-26 NOTE — Assessment & Plan Note (Addendum)
BP Readings from Last 3 Encounters:  01/21/17 118/79  01/08/17 (!) 198/115  10/23/16 102/72    Lab Results  Component Value Date   NA 129 (L) 10/23/2016   K 4.4 10/23/2016   CREATININE 0.54 (L) 10/23/2016   BP markedly elevated on arrival at 198/115. Improved dramatically to 118/79 on repeat. He is currently on HCTZ 25 mg daily, Lisinopril 40 mg daily and Norvasc 10 mg daily. Reports compliance. Denies any lightheadedness/dizziness, chest pain/palpiations, nausea/voimting, or vision changes.   Assessment: Stable, controlled HTN  Plan: Continue current medications

## 2017-01-27 NOTE — Progress Notes (Signed)
Urine drug screen for this encounter is consistent for prescribed medication 

## 2017-01-27 NOTE — Progress Notes (Signed)
Internal Medicine Clinic Attending  Case discussed with Dr. Boswell at the time of the visit.  We reviewed the resident's history and exam and pertinent patient test results.  I agree with the assessment, diagnosis, and plan of care documented in the resident's note.  

## 2017-02-05 ENCOUNTER — Encounter: Payer: Self-pay | Admitting: Physical Medicine & Rehabilitation

## 2017-02-05 ENCOUNTER — Encounter
Payer: BLUE CROSS/BLUE SHIELD | Attending: Physical Medicine & Rehabilitation | Admitting: Physical Medicine & Rehabilitation

## 2017-02-05 VITALS — BP 127/84 | HR 73 | Resp 14

## 2017-02-05 DIAGNOSIS — G8220 Paraplegia, unspecified: Secondary | ICD-10-CM

## 2017-02-05 DIAGNOSIS — Z79899 Other long term (current) drug therapy: Secondary | ICD-10-CM | POA: Insufficient documentation

## 2017-02-05 DIAGNOSIS — G894 Chronic pain syndrome: Secondary | ICD-10-CM | POA: Diagnosis present

## 2017-02-05 DIAGNOSIS — M25561 Pain in right knee: Secondary | ICD-10-CM | POA: Insufficient documentation

## 2017-02-05 DIAGNOSIS — Z5181 Encounter for therapeutic drug level monitoring: Secondary | ICD-10-CM | POA: Insufficient documentation

## 2017-02-05 DIAGNOSIS — G479 Sleep disorder, unspecified: Secondary | ICD-10-CM | POA: Diagnosis not present

## 2017-02-05 DIAGNOSIS — I10 Essential (primary) hypertension: Secondary | ICD-10-CM

## 2017-02-05 DIAGNOSIS — M791 Myalgia, unspecified site: Secondary | ICD-10-CM

## 2017-02-05 DIAGNOSIS — R269 Unspecified abnormalities of gait and mobility: Secondary | ICD-10-CM

## 2017-02-05 DIAGNOSIS — G8929 Other chronic pain: Secondary | ICD-10-CM

## 2017-02-05 MED ORDER — LIDOCAINE 5 % EX PTCH: 1.0000 | MEDICATED_PATCH | CUTANEOUS | 0 refills | Status: DC

## 2017-02-05 MED ORDER — METHOCARBAMOL 500 MG PO TABS: 500.0000 mg | ORAL_TABLET | Freq: Two times a day (BID) | ORAL | 0 refills | Status: DC | PRN

## 2017-02-05 NOTE — Addendum Note (Signed)
Addended by: Delice Lesch A on: 02/05/2017 12:22 PM   Modules accepted: Orders

## 2017-02-05 NOTE — Progress Notes (Addendum)
Subjective:    Patient ID: Shawn [name]., male    DOB: 21-Jun-1951, 66 y.o.   MRN: [id number]  HPI 66 y/o male with pmh/psh of paraplegia, Etoh abuse, HTN, L-spine surgery, flap present for follow up of pain >> right knee.  Initially stated: Presented since ~1995.  Exacerbated since fracture in 2007. Stable since then.  Narcotics improve the pain.  Cold exacerbate the pain.  Achy.  Non-radiating.  He has sensation to his knees.  Intermittent.  Ice/heat do not help.  Denies falls. Pain makes things uncomfortable.  Last clinic visit 01/08/17. Wife is present who provides much of the history. Since that time he obtained an xray.  His brace is being ordered.  He used Voltaren gel with little benefit.  The Tramadol helps.   Pain Inventory Average Pain 6 Pain Right Now 9 My pain is constant and aching  In the last 24 hours, has pain interfered with the following? General activity 7 Relation with others 4 Enjoyment of life 9 What TIME of day is your pain at its worst? daytime Sleep (in general) Poor  Pain is worse with: sitting and lying down Pain improves with: medication Relief from Meds: 5  Mobility use a wheelchair needs help with transfers  Function disabled: date disabled 10/1972  Neuro/Psych weakness  Prior Studies x-rays  Physicians involved in your care Any changes since last visit?  no   Family History  Problem Relation Age of Onset  . Stroke Mother   . Coronary artery disease Father   . Coronary artery disease Paternal Uncle   . Coronary artery disease Paternal Aunt    Social History   Social History  . Marital status: Married    Spouse name: N/A  . Number of children: 1  . Years of education: N/A   Occupational History  . DISABILITY Unemployed   Social History Main Topics  . Smoking status: Current Every Day Smoker    Packs/day: 0.50    Years: 40.00    Types: Cigarettes    Last attempt to quit: 08/09/2008  . Smokeless tobacco: Never Used   Comment: 1 PACK 2 DAYS  . Alcohol use No  . Drug use: No  . Sexual activity: Not Asked   Other Topics Concern  . None   Social History Narrative   Married, disabled, medicaid, functions independently at home, paraplegic      Family hx of : hypertension, diabetes, kidney disease, and other cancer   Past Surgical History:  Procedure Laterality Date  . BONE BIOPSY  11/2007   negative for organisms  . sacral wound flap  2002   done by Dr. Stephanie Coup  . SPINE SURGERY     Past Medical History:  Diagnosis Date  . Allergy   . C. difficile colitis 11/2008  . Decubitus ulcer 1999   excision of right ischial pressure sore with flap reconstruction done by Dr. Denese Killings 10/31/2008  . GERD (gastroesophageal reflux disease)   . Hyperlipidemia   . Hypertension   . Paraplegia ([place])    secondary to [place]  . Perineal abscess   . Substance abuse    alcohol   BP 127/84   Pulse 73   Resp 14   SpO2 96%   Opioid Risk Score:   Fall Risk Score:  `1  Depression screen PHQ 2/9  Depression screen [facility] 2/9 02/05/2017 01/21/2017 01/08/2017 10/23/2016 11/08/2015 10/25/2015 10/25/2015  Decreased Interest 0 0 3 0 0 0 0  Down, Depressed,  Hopeless 0 0 0 0 0 0 0  PHQ - 2 Score 0 0 3 0 0 0 0  Altered sleeping - - 3 - - - -  Tired, decreased energy - - 3 - - - -  Change in appetite - - 0 - - - -  Feeling bad or failure about yourself  - - 0 - - - -  Trouble concentrating - - 1 - - - -  Moving slowly or fidgety/restless - - 0 - - - -  Suicidal thoughts - - 0 - - - -  PHQ-9 Score - - 10 - - - -  Difficult doing work/chores - Somewhat difficult Somewhat difficult - - - -  Some recent data might be hidden    Review of Systems  HENT: Negative.   Eyes: Negative.   Respiratory: Negative.   Cardiovascular: Positive for leg swelling.  Gastrointestinal: Negative.   Endocrine: Negative.   Genitourinary: Positive for difficulty urinating.  Musculoskeletal: Positive for arthralgias, myalgias and neck  stiffness.  Skin: Negative.   Neurological: Positive for weakness and numbness.  Hematological: Negative.   Psychiatric/Behavioral: Negative.   All other systems reviewed and are negative.     Objective:   Physical Exam Gen: NAD. Vital signs reviewed HENT: Normocephalic, Atraumatic Eyes: EOMI. No discharge.  Cardio: RRR. No JVD. Pulm: B/l clear to auscultation.  Effort normal Abd: Soft, BS+ MSK:  Gait nonambulatory.   No TTP.    Edema in right knee.  Neuro:   Sensation absent below b/l knee, WNL proximally  Strength  RLE HF 2/5, distally 0/5    LLEL HF/KE 2/5, distally 0/5 Skin: Warm and Dry. Intact. Healed scars.     Assessment & Plan:  66 y/o male with pmh/psh of paraplegia, Etoh abuse, HTN, L-spine surgery, flap present for follow up for pain >> right knee.  1. Chronic mechanical low back pain with chronic pain syndrome  Xray 10/2006 reviewed, revealing significant fractures  UDS performed, reviewed  Heat/Cold ineffective for patient  Xray of right knee reviewed, chronic changes  In the process of obtaining bracing  Cont Voltaren gel  Cont Tramadol 50mg  q6 PRN  Will order Lidoderm patch  Encouraged trial of TENS (wife has a unit)  Will consider Cymbalta in futures  Pt has steroid injection in past with some benefit, will consider repeat injection  Will order Robaxin 500 BID PRN  D/c Oxycodone  2. Gait abnormality  Cont wheelchair  3. Sleep disturbance  Variable  4. HTN  Improved today  Cont to follow up with PCP  5. Paraplegia  Being followed by PCP  Pt may follow if additional needs arise

## 2017-02-18 ENCOUNTER — Other Ambulatory Visit: Payer: Self-pay | Admitting: *Deleted

## 2017-02-26 ENCOUNTER — Encounter: Payer: Self-pay | Admitting: Internal Medicine

## 2017-02-26 ENCOUNTER — Ambulatory Visit (INDEPENDENT_AMBULATORY_CARE_PROVIDER_SITE_OTHER): Payer: BLUE CROSS/BLUE SHIELD | Admitting: Internal Medicine

## 2017-02-26 VITALS — BP 130/76 | HR 67 | Temp 97.7°F | Ht 73.5 in

## 2017-02-26 DIAGNOSIS — F1120 Opioid dependence, uncomplicated: Secondary | ICD-10-CM

## 2017-02-26 DIAGNOSIS — Z79899 Other long term (current) drug therapy: Secondary | ICD-10-CM

## 2017-02-26 DIAGNOSIS — R05 Cough: Secondary | ICD-10-CM

## 2017-02-26 DIAGNOSIS — K219 Gastro-esophageal reflux disease without esophagitis: Secondary | ICD-10-CM | POA: Diagnosis not present

## 2017-02-26 DIAGNOSIS — I10 Essential (primary) hypertension: Secondary | ICD-10-CM

## 2017-02-26 DIAGNOSIS — F17210 Nicotine dependence, cigarettes, uncomplicated: Secondary | ICD-10-CM

## 2017-02-26 DIAGNOSIS — R059 Cough, unspecified: Secondary | ICD-10-CM

## 2017-02-26 MED ORDER — EZETIMIBE 10 MG PO TABS: 10.0000 mg | ORAL_TABLET | Freq: Every day | ORAL | 2 refills | Status: DC

## 2017-02-26 MED ORDER — LOSARTAN POTASSIUM 50 MG PO TABS: 50.0000 mg | ORAL_TABLET | Freq: Every day | ORAL | 1 refills | Status: DC

## 2017-02-26 NOTE — Progress Notes (Signed)
   CC: Cough  HPI:  [name] is a 65 y.o. male with a past medical history listed below here today with complaints of cough.   For details of today's visit and the status of his chronic medical issues please refer to the assessment and plan.   Past Medical History:  Diagnosis Date  . Allergy   . C. difficile colitis 11/2008  . Decubitus ulcer 1999   excision of right ischial pressure sore with flap reconstruction done by Dr. Denese Killings 10/31/2008  . GERD (gastroesophageal reflux disease)   . Hyperlipidemia   . Hypertension   . Paraplegia ([place])    secondary to [place]  . Perineal abscess   . Substance abuse    alcohol    Review of Systems:  See HPI  Physical Exam:  Vitals:   02/26/17 1516  BP: 130/76  Pulse: 67  Temp: 97.7 F (36.5 C)  TempSrc: Oral  Height: 6' 1.5" (1.867 m)   Physical Exam  Constitutional: He is well-developed, well-nourished, and in no distress. No distress.  HENT:  Head: Normocephalic and atraumatic.  Right Ear: External ear normal.  Left Ear: External ear normal.  Nose: Nose normal.  Mouth/Throat: Oropharynx is clear and moist.  Cardiovascular: Normal rate and regular rhythm.   Pulmonary/Chest: Effort normal and breath sounds normal. He has no wheezes. He has no rales.  Abdominal: Soft. Bowel sounds are normal.  Vitals reviewed.   Assessment & Plan:   See Encounters Tab for problem based charting.  Patient discussed with Dr. Lynnae January

## 2017-02-26 NOTE — Patient Instructions (Signed)
Shawn [name],  For your cough, I am going to stop your lisinopril as this could be a side effect of the medication. Instead of the lisinopril I would like you to start a different blood pressure medication called Losartan (Cozaar) 50 mg daily. I have sent in a prescription for this. Additionally, you can take over the counter Claritin or Zyrtec for allergies.   I would like you to follow up in clinic in about 2 weeks (I am in clinic all next month - schedule an acute care visit) to check on your blood pressure and cough. If you cough hasn't improved we will likely need to get a chest xray.

## 2017-02-28 ENCOUNTER — Encounter
Payer: BLUE CROSS/BLUE SHIELD | Attending: Physical Medicine & Rehabilitation | Admitting: Physical Medicine & Rehabilitation

## 2017-02-28 ENCOUNTER — Encounter: Payer: Self-pay | Admitting: Physical Medicine & Rehabilitation

## 2017-02-28 VITALS — BP 125/81 | HR 61

## 2017-02-28 DIAGNOSIS — R269 Unspecified abnormalities of gait and mobility: Secondary | ICD-10-CM

## 2017-02-28 DIAGNOSIS — M791 Myalgia, unspecified site: Secondary | ICD-10-CM

## 2017-02-28 DIAGNOSIS — G894 Chronic pain syndrome: Secondary | ICD-10-CM | POA: Diagnosis not present

## 2017-02-28 DIAGNOSIS — G479 Sleep disorder, unspecified: Secondary | ICD-10-CM | POA: Diagnosis not present

## 2017-02-28 DIAGNOSIS — G8220 Paraplegia, unspecified: Secondary | ICD-10-CM | POA: Diagnosis not present

## 2017-02-28 DIAGNOSIS — G8929 Other chronic pain: Secondary | ICD-10-CM | POA: Insufficient documentation

## 2017-02-28 DIAGNOSIS — Z5181 Encounter for therapeutic drug level monitoring: Secondary | ICD-10-CM | POA: Diagnosis not present

## 2017-02-28 DIAGNOSIS — M25561 Pain in right knee: Secondary | ICD-10-CM | POA: Insufficient documentation

## 2017-02-28 DIAGNOSIS — Z79899 Other long term (current) drug therapy: Secondary | ICD-10-CM | POA: Diagnosis not present

## 2017-02-28 MED ORDER — LIDOCAINE 5 % EX PTCH: 1.0000 | MEDICATED_PATCH | CUTANEOUS | 1 refills | Status: DC

## 2017-02-28 MED ORDER — TRAMADOL HCL 50 MG PO TABS: 100.0000 mg | ORAL_TABLET | Freq: Three times a day (TID) | ORAL | 1 refills | Status: DC | PRN

## 2017-02-28 MED ORDER — DICLOFENAC SODIUM 1 % TD GEL: 2.0000 g | Freq: Four times a day (QID) | TRANSDERMAL | 1 refills | Status: DC

## 2017-02-28 NOTE — Progress Notes (Signed)
Subjective:    Patient ID: Shawn [name]., male    DOB: [birth year], 66 y.o.   MRN: [id number]  HPI 66 y/o male with pmh/psh of paraplegia, Etoh abuse, HTN, L-spine surgery, flap present for follow up of pain >> right knee.  Initially stated: Presented since ~1995.  Exacerbated since fracture in 2007. Stable since then.  Narcotics improve the pain.  Cold exacerbate the pain.  Achy.  Non-radiating.  He has sensation to his knees.  Intermittent.  Ice/heat do not help.  Denies falls. Pain makes things uncomfortable.  Last clinic visit 02/05/17. Wife is present who provides much of the history. At that time, pt was encouraged to obtain a brace, but states they are awaiting for information from me.  Voltaren gel helping. He is using Tramadol q6 hours, but is not receiving much benefit.  The patch is helping.  He still has not tried TENS unit.  He states the Robaxin helps. He continues to complain of pain after the xray.    Pain Inventory Average Pain 6 Pain Right Now 9 My pain is constant and aching  In the last 24 hours, has pain interfered with the following? General activity 7 Relation with others 4 Enjoyment of life 9 What TIME of day is your pain at its worst? daytime Sleep (in general) Poor  Pain is worse with: sitting and lying down Pain improves with: medication Relief from Meds: 5  Mobility use a wheelchair needs help with transfers  Function disabled: date disabled 10/1972  Neuro/Psych weakness  Prior Studies x-rays  Physicians involved in your care Any changes since last visit?  no   Family History  Problem Relation Age of Onset  . Stroke Mother   . Coronary artery disease Father   . Coronary artery disease Paternal Uncle   . Coronary artery disease Paternal Aunt    Social History   Social History  . Marital status: Married    Spouse name: N/A  . Number of children: 1  . Years of education: N/A   Occupational History  . DISABILITY Unemployed    Social History Main Topics  . Smoking status: Current Every Day Smoker    Packs/day: 0.50    Years: 40.00    Types: Cigarettes    Last attempt to quit: 08/09/2008  . Smokeless tobacco: Never Used     Comment: 1 PACK 2 DAYS  . Alcohol use No  . Drug use: No  . Sexual activity: Not Asked   Other Topics Concern  . None   Social History Narrative   Married, disabled, medicaid, functions independently at home, paraplegic      Family hx of : hypertension, diabetes, kidney disease, and other cancer   Past Surgical History:  Procedure Laterality Date  . BONE BIOPSY  11/2007   negative for organisms  . sacral wound flap  2002   done by Dr. Stephanie Coup  . SPINE SURGERY     Past Medical History:  Diagnosis Date  . Allergy   . C. difficile colitis 11/2008  . Decubitus ulcer 1999   excision of right ischial pressure sore with flap reconstruction done by Dr. Denese Killings 10/31/2008  . GERD (gastroesophageal reflux disease)   . Hyperlipidemia   . Hypertension   . Paraplegia ([place])    secondary to [place]  . Perineal abscess   . Substance abuse    alcohol   BP 125/81   Pulse 61   SpO2 93%   Opioid Risk  Score:   Fall Risk Score:  `1  Depression screen PHQ 2/9  Depression screen [facility] 2/9 02/26/2017 02/05/2017 01/21/2017 01/08/2017 10/23/2016 11/08/2015 10/25/2015  Decreased Interest 0 0 0 3 0 0 0  Down, Depressed, Hopeless 0 0 0 0 0 0 0  PHQ - 2 Score 0 0 0 3 0 0 0  Altered sleeping - - - 3 - - -  Tired, decreased energy - - - 3 - - -  Change in appetite - - - 0 - - -  Feeling bad or failure about yourself  - - - 0 - - -  Trouble concentrating - - - 1 - - -  Moving slowly or fidgety/restless - - - 0 - - -  Suicidal thoughts - - - 0 - - -  PHQ-9 Score - - - 10 - - -  Difficult doing work/chores Somewhat difficult - Somewhat difficult Somewhat difficult - - -  Some recent data might be hidden    Review of Systems  HENT: Negative.   Eyes: Negative.   Respiratory: Negative.    Cardiovascular: Positive for leg swelling.  Gastrointestinal: Negative.   Endocrine: Negative.   Genitourinary: Positive for difficulty urinating.  Musculoskeletal: Positive for arthralgias, myalgias and neck stiffness.  Skin: Negative.   Neurological: Positive for weakness and numbness.  Hematological: Negative.   Psychiatric/Behavioral: Negative.   All other systems reviewed and are negative.     Objective:   Physical Exam Gen: NAD. Vital signs reviewed HENT: Normocephalic, Atraumatic Eyes: EOMI. No discharge.  Cardio: RRR. No JVD. Pulm: B/l clear to auscultation.  Effort normal Abd: Soft, BS+ MSK:  Gait nonambulatory.   No TTP.   Neuro:   Sensation absent below b/l knee, WNL proximally  Strength  RLE HF 2/5, distally 0/5    LLEL HF/KE 2/5, distally 0/5 Skin: Warm and Dry. Intact. Healed scars.     Assessment & Plan:  66 y/o male with pmh/psh of paraplegia, Etoh abuse, HTN, L-spine surgery, flap present for follow up for pain >> right knee.  1. Chronic mechanical low back pain with chronic pain syndrome  Xray 10/2006 reviewed, revealing significant fractures  Heat/Cold ineffective for patient  Xray of right knee reviewed, chronic changes  In the process of obtaining bracing, continues to await ?provider signature  Cont Voltaren gel  Cont Lidoderm patch  Encouraged trial of TENS (wife has a unit, encouraged again)  Will order Cymbalta 30mg   Cont Robaxin 500 BID PRN  Will change Tramadol to 100mg  TID PRN  D/ced Oxycodone  Pt has steroid injection in past with some benefit, will consider repeat injection  2. Gait abnormality  Cont wheelchair  3. Sleep disturbance  Variable  4. Paraplegia  Being followed by PCP  Pt may follow if additional needs arise

## 2017-03-03 DIAGNOSIS — R05 Cough: Secondary | ICD-10-CM | POA: Insufficient documentation

## 2017-03-03 DIAGNOSIS — R059 Cough, unspecified: Secondary | ICD-10-CM | POA: Insufficient documentation

## 2017-03-03 NOTE — Assessment & Plan Note (Signed)
Shawn [name] has complaints of non-productive cough today. Reports symptoms have been ongoing for the past month or so. Denies any fevers, chills, nausea/vomiting, chest pain or shortness of breath. He does have a smoking history. He is currently on an ACE-I. Does report seasonal allergies that he is not on any medications for currently. Has a history of GERD and is currently on prevacid. No history of asthma.   Assessment: Acute cough  Plan: Will do trial of OTC allergy pills (declined nasal spray) as well as stopping his ACE-I and replacing with Losartan. Follow up in 2 weeks. If no improvement in symptoms, will consider CXR.

## 2017-03-03 NOTE — Assessment & Plan Note (Signed)
Followed by PM&R. Doing well on the tramadol as well as lidocaine patches. Currently being prescribed by PM&R. Will continue to follow.

## 2017-03-03 NOTE — Assessment & Plan Note (Addendum)
BP Readings from Last 3 Encounters:  02/28/17 125/81  02/26/17 130/76  02/05/17 127/84    Lab Results  Component Value Date   NA 129 (L) 10/23/2016   K 4.4 10/23/2016   CREATININE 0.54 (L) 10/23/2016   BP 125/81 today. Currently on HCTZ 25 mg daily, Lisinopril 40 mg daily and Norvasc 10 mg daily. Complaints of nonproductive cough today for the past month (see separate problem).  Assessment: Controlled HTN  Plan: D/C lisinopril today Start Losartan 50 mg daily RTC in 2 weeks for recheck

## 2017-03-04 ENCOUNTER — Telehealth: Payer: Self-pay | Admitting: *Deleted

## 2017-03-04 NOTE — Telephone Encounter (Signed)
Due to insurance change can you discontinue the lansoprazole and order omeprazole. Also she states he is not a morning person and wanted his appt at a 1315 time like he had ask for, changed from 4/5 to 4/6

## 2017-03-04 NOTE — Progress Notes (Signed)
Internal Medicine Clinic Attending  Case discussed with Dr. Boswell at the time of the visit.  We reviewed the resident's history and exam and pertinent patient test results.  I agree with the assessment, diagnosis, and plan of care documented in the resident's note.  

## 2017-03-05 MED ORDER — OMEPRAZOLE 40 MG PO CPDR: 40.0000 mg | DELAYED_RELEASE_CAPSULE | Freq: Every day | ORAL | 2 refills | Status: DC

## 2017-03-13 ENCOUNTER — Ambulatory Visit: Payer: BLUE CROSS/BLUE SHIELD

## 2017-03-14 ENCOUNTER — Ambulatory Visit (INDEPENDENT_AMBULATORY_CARE_PROVIDER_SITE_OTHER): Payer: BLUE CROSS/BLUE SHIELD | Admitting: Internal Medicine

## 2017-03-14 ENCOUNTER — Encounter: Payer: Self-pay | Admitting: Internal Medicine

## 2017-03-14 VITALS — BP 138/90 | HR 74 | Temp 98.0°F

## 2017-03-14 DIAGNOSIS — Z0000 Encounter for general adult medical examination without abnormal findings: Secondary | ICD-10-CM

## 2017-03-14 DIAGNOSIS — R05 Cough: Secondary | ICD-10-CM

## 2017-03-14 DIAGNOSIS — R059 Cough, unspecified: Secondary | ICD-10-CM

## 2017-03-14 DIAGNOSIS — J302 Other seasonal allergic rhinitis: Secondary | ICD-10-CM | POA: Diagnosis not present

## 2017-03-14 DIAGNOSIS — F17210 Nicotine dependence, cigarettes, uncomplicated: Secondary | ICD-10-CM

## 2017-03-14 NOTE — Progress Notes (Signed)
   CC: Cough follow up  HPI:  Mr.Clinten L Ulysee Fyock. is a 66 y.o. male with a past medical history listed below here today for follow up of his cough.   For details of today's visit and the status of his chronic medical issues please refer to the assessment and plan.   Past Medical History:  Diagnosis Date  . Allergy   . C. difficile colitis 11/2008  . Decubitus ulcer 1999   excision of right ischial pressure sore with flap reconstruction done by Dr. Denese Killings 10/31/2008  . GERD (gastroesophageal reflux disease)   . Hyperlipidemia   . Hypertension   . Paraplegia ([place])    secondary to [place]  . Perineal abscess   . Substance abuse    alcohol    Review of Systems:   See HPI  Physical Exam:  Vitals:   03/14/17 1330 03/14/17 1347  BP: (!) 156/104 138/90  Pulse: 81 74  Temp: 98 F (36.7 C)   TempSrc: Oral   SpO2: 99%    Physical Exam  Constitutional: He is well-developed, well-nourished, and in no distress. No distress.  Cardiovascular: Normal rate, regular rhythm and normal heart sounds.   Pulmonary/Chest: Effort normal and breath sounds normal.    Assessment & Plan:   See Encounters Tab for problem based charting.  Patient discussed with Dr. Angelia Mould

## 2017-03-14 NOTE — Assessment & Plan Note (Addendum)
Returns today for follow up of his cough. At last visit he had complaints of cough for about a month. He was switched off his lisinopril and changed to losartan. Additionally, had complaints of seasonal allergies and possible post nasal drip so trial of OTC allergy pills was started (had declined nasal sprays).   Today, he reports his cough has resolved. He stopped taking the lisinopril as instructed and has been taking the losartan. He also started taking loratadine 10 mg daily. Feels much improved with no complaints today.   BP was initially elevated at 156/104 and improved to 138/90 on recheck. This is in line with his prior blood pressures and while the diastolic being elevated is slightly concerning I do not feel we need to make any adjustments to his regimen today.   Assessment: Cough - resolved  Plan: Continue current medications.

## 2017-03-14 NOTE — Patient Instructions (Signed)
Mr. Koranda,   I am glad you are doing well. We don't need to make any changes today. We will get you arranged to have the colonoscopy.   Please follow up with me in 6 months.

## 2017-03-14 NOTE — Assessment & Plan Note (Signed)
Patient is due for colonoscopy. Will refer to GI today.

## 2017-03-18 NOTE — Progress Notes (Signed)
Internal Medicine Clinic Attending  Case discussed with Dr. Boswell at the time of the visit.  We reviewed the resident's history and exam and pertinent patient test results.  I agree with the assessment, diagnosis, and plan of care documented in the resident's note.  

## 2017-03-31 ENCOUNTER — Other Ambulatory Visit: Payer: Self-pay | Admitting: Physical Medicine & Rehabilitation

## 2017-04-07 DIAGNOSIS — N139 Obstructive and reflux uropathy, unspecified: Secondary | ICD-10-CM | POA: Diagnosis not present

## 2017-04-16 ENCOUNTER — Telehealth: Payer: Self-pay | Admitting: Gastroenterology

## 2017-04-16 ENCOUNTER — Encounter: Payer: Self-pay | Admitting: Gastroenterology

## 2017-04-16 NOTE — Telephone Encounter (Signed)
Please advise 

## 2017-04-16 NOTE — Telephone Encounter (Signed)
Patient is paraplegic and can't walk, not sure if he could even be done in the [facility] given this history. I think I should see him in clinic to discuss options further, may consider stool study for screening if that is easier for him. Thanks

## 2017-04-16 NOTE — Telephone Encounter (Signed)
Patient wife will callback to schedule OV with Dr. Havery Moros.

## 2017-04-16 NOTE — Telephone Encounter (Signed)
Please schedule for an office visit with Dr. Havery Moros. Thank you.

## 2017-04-17 ENCOUNTER — Other Ambulatory Visit: Payer: Self-pay

## 2017-04-21 MED ORDER — LOSARTAN POTASSIUM 50 MG PO TABS: 50.0000 mg | ORAL_TABLET | Freq: Every day | ORAL | 2 refills | Status: DC

## 2017-04-23 ENCOUNTER — Other Ambulatory Visit: Payer: Self-pay | Admitting: *Deleted

## 2017-04-23 MED ORDER — OMEPRAZOLE 40 MG PO CPDR: 40.0000 mg | DELAYED_RELEASE_CAPSULE | Freq: Every day | ORAL | 2 refills | Status: DC

## 2017-04-26 ENCOUNTER — Other Ambulatory Visit: Payer: Self-pay | Admitting: Internal Medicine

## 2017-04-30 ENCOUNTER — Encounter: Payer: BLUE CROSS/BLUE SHIELD | Admitting: Physical Medicine & Rehabilitation

## 2017-05-01 ENCOUNTER — Other Ambulatory Visit: Payer: Self-pay | Admitting: Internal Medicine

## 2017-05-07 DIAGNOSIS — N312 Flaccid neuropathic bladder, not elsewhere classified: Secondary | ICD-10-CM | POA: Diagnosis not present

## 2017-05-08 ENCOUNTER — Other Ambulatory Visit: Payer: Self-pay | Admitting: Physical Medicine & Rehabilitation

## 2017-05-15 ENCOUNTER — Encounter: Payer: Self-pay | Admitting: Physical Medicine & Rehabilitation

## 2017-05-15 ENCOUNTER — Encounter
Payer: BLUE CROSS/BLUE SHIELD | Attending: Physical Medicine & Rehabilitation | Admitting: Physical Medicine & Rehabilitation

## 2017-05-15 VITALS — BP 134/88 | HR 77

## 2017-05-15 DIAGNOSIS — G8220 Paraplegia, unspecified: Secondary | ICD-10-CM | POA: Diagnosis not present

## 2017-05-15 DIAGNOSIS — M791 Myalgia, unspecified site: Secondary | ICD-10-CM

## 2017-05-15 DIAGNOSIS — G8929 Other chronic pain: Secondary | ICD-10-CM | POA: Diagnosis not present

## 2017-05-15 DIAGNOSIS — G479 Sleep disorder, unspecified: Secondary | ICD-10-CM

## 2017-05-15 DIAGNOSIS — M25561 Pain in right knee: Secondary | ICD-10-CM | POA: Insufficient documentation

## 2017-05-15 DIAGNOSIS — Z79899 Other long term (current) drug therapy: Secondary | ICD-10-CM | POA: Diagnosis not present

## 2017-05-15 DIAGNOSIS — Z5181 Encounter for therapeutic drug level monitoring: Secondary | ICD-10-CM | POA: Insufficient documentation

## 2017-05-15 DIAGNOSIS — I10 Essential (primary) hypertension: Secondary | ICD-10-CM

## 2017-05-15 DIAGNOSIS — G894 Chronic pain syndrome: Secondary | ICD-10-CM

## 2017-05-15 DIAGNOSIS — R269 Unspecified abnormalities of gait and mobility: Secondary | ICD-10-CM

## 2017-05-15 MED ORDER — METHOCARBAMOL 500 MG PO TABS: 500.0000 mg | ORAL_TABLET | Freq: Two times a day (BID) | ORAL | 1 refills | Status: DC | PRN

## 2017-05-15 NOTE — Progress Notes (Signed)
Subjective:    Patient ID: Shawn [name]., male    DOB: 12-16-1950, 65 y.o.   MRN: [id number]  HPI 66 y/o male with pmh/psh of paraplegia, Etoh abuse, HTN, L-spine surgery, flap present for follow up of pain >> right knee.  Initially stated: Presented since ~1995.  Exacerbated since fracture in 2007. Stable since then.  Narcotics improve the pain.  Cold exacerbate the pain.  Achy.  Non-radiating.  He has sensation to his knees.  Intermittent.  Ice/heat do not help.  Denies falls. Pain makes things uncomfortable.  Last clinic visit 02/28/17. Wife is present who provides much of the history. Since last visit, he has obtained the brace, which provides minimal benefit. Cool temperatures make the pain worse. He still has not tried a TENS unit.  He states he does not want to be experimented on.  He does not note improvement with Cymbalta. Tramadol does not help. Today he continues to complain of pain, requesting Percocet.   Pain Inventory Average Pain 8 Pain Right Now 10 My pain is constant and aching  In the last 24 hours, has pain interfered with the following? General activity 9 Relation with others 4 Enjoyment of life 9 What TIME of day is your pain at its worst? all Sleep (in general) Fair  Pain is worse with: sitting, inactivity and lying down Pain improves with: nothing Relief from Meds: 0  Mobility use a wheelchair needs help with transfers  Function disabled: date disabled 10/1972 retired  Neuro/Psych weakness  Prior Studies Any changes since last visit?  no x-rays  Physicians involved in your care Any changes since last visit?  no   Family History  Problem Relation Age of Onset  . Stroke Mother   . Coronary artery disease Father   . Coronary artery disease Paternal Uncle   . Coronary artery disease Paternal Aunt    Social History   Social History  . Marital status: Married    Spouse name: N/A  . Number of children: 1  . Years of education: N/A    Occupational History  . DISABILITY Unemployed   Social History Main Topics  . Smoking status: Current Every Day Smoker    Packs/day: 0.50    Years: 40.00    Types: Cigarettes    Last attempt to quit: 08/09/2008  . Smokeless tobacco: Never Used     Comment: 1 PACK 2 DAYS  . Alcohol use No  . Drug use: No  . Sexual activity: Not Asked   Other Topics Concern  . None   Social History Narrative   Married, disabled, medicaid, functions independently at home, paraplegic      Family hx of : hypertension, diabetes, kidney disease, and other cancer   Past Surgical History:  Procedure Laterality Date  . BONE BIOPSY  11/2007   negative for organisms  . sacral wound flap  2002   done by Dr. Stephanie Coup  . SPINE SURGERY     Past Medical History:  Diagnosis Date  . Allergy   . C. difficile colitis 11/2008  . Decubitus ulcer 1999   excision of right ischial pressure sore with flap reconstruction done by Dr. Denese Killings 10/31/2008  . GERD (gastroesophageal reflux disease)   . Hyperlipidemia   . Hypertension   . Paraplegia ([place])    secondary to [place]  . Perineal abscess   . Substance abuse    alcohol   BP 134/88   Pulse 77   SpO2 96%  Opioid Risk Score:   Fall Risk Score:  `1  Depression screen PHQ 2/9  Depression screen [facility] 2/9 03/14/2017 02/26/2017 02/05/2017 01/21/2017 01/08/2017 10/23/2016 11/08/2015  Decreased Interest 0 0 0 0 3 0 0  Down, Depressed, Hopeless 0 0 0 0 0 0 0  PHQ - 2 Score 0 0 0 0 3 0 0  Altered sleeping - - - - 3 - -  Tired, decreased energy - - - - 3 - -  Change in appetite - - - - 0 - -  Feeling bad or failure about yourself  - - - - 0 - -  Trouble concentrating - - - - 1 - -  Moving slowly or fidgety/restless - - - - 0 - -  Suicidal thoughts - - - - 0 - -  PHQ-9 Score - - - - 10 - -  Difficult doing work/chores - Somewhat difficult - Somewhat difficult Somewhat difficult - -  Some recent data might be hidden    Review of Systems  HENT: Negative.    Eyes: Negative.   Respiratory: Negative.   Cardiovascular: Negative.   Gastrointestinal: Negative.   Endocrine: Negative.   Genitourinary: Positive for difficulty urinating.  Musculoskeletal: Positive for arthralgias and myalgias.  Skin: Negative.   Allergic/Immunologic: Negative.   Neurological: Positive for weakness.  Hematological: Negative.   Psychiatric/Behavioral: Negative.   All other systems reviewed and are negative.     Objective:   Physical Exam Gen: NAD. Vital signs reviewed HENT: Normocephalic, Atraumatic Eyes: EOMI. No discharge.  Cardio: RRR. No JVD. Pulm: B/l clear to auscultation.  Effort normal Abd: Soft, BS+ MSK:  Gait nonambulatory.   No TTP.   Neuro:   Sensation absent below b/l knee, WNL proximally  Strength  RLE HF 2/5, distally 0/5    LLE HF/KE 2/5, distally 0/5 Skin: Warm and Dry. Intact. Healed scars.     Assessment & Plan:  66 y/o male with pmh/psh of paraplegia, Etoh abuse, HTN, L-spine surgery, flap present for follow up for pain >> right knee.  1. Chronic mechanical low back pain with chronic pain syndrome  Xray 10/2006 reviewed, revealing significant fractures  Heat/Cold ineffective for patient  Xray of right knee reviewed, chronic changes  Cont bracing PRN  Cont Voltaren gel  Cont Lidoderm patch  Encouraged trial of TENS (wife has a unit, encouraged 3rd time), will order IT TENS  Cymbalta increased to 60mg   Cont Robaxin 500 BID PRN  Cont Tramadol to 100mg  TID PRN  D/ced Oxycodone  Pt does better with heat - encouraged means of keeping knee warm with blankets, heating pads (with barrier), etc  Pt has had steroid injections with benefit in the past, but last couple of injections did not last long, willl schedule Zilretta  2. Gait abnormality  Cont wheelchair  3. Sleep disturbance  Variable, getting worse at present  4. Paraplegia  Being followed by PCP  Pt may follow if additional needs arise

## 2017-05-21 ENCOUNTER — Ambulatory Visit (INDEPENDENT_AMBULATORY_CARE_PROVIDER_SITE_OTHER): Payer: BLUE CROSS/BLUE SHIELD | Admitting: Gastroenterology

## 2017-05-21 ENCOUNTER — Encounter: Payer: Self-pay | Admitting: Gastroenterology

## 2017-05-21 VITALS — BP 120/80 | HR 84

## 2017-05-21 DIAGNOSIS — G839 Paralytic syndrome, unspecified: Secondary | ICD-10-CM

## 2017-05-21 DIAGNOSIS — Z1211 Encounter for screening for malignant neoplasm of colon: Secondary | ICD-10-CM | POA: Diagnosis not present
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
## 2017-05-21 NOTE — Patient Instructions (Signed)
If you are age 69 or older, your body mass index should be between 23-30. Your There is no height or weight on file to calculate BMI. If this is out of the aforementioned range listed, please consider follow up with your Primary Care Provider.  If you are age 80 or younger, your body mass index should be between 19-25. Your There is no height or weight on file to calculate BMI. If this is out of the aformentioned range listed, please consider follow up with your Primary Care Provider.   Your physician has requested that you go to the basement for the following lab work before leaving today:  IFOB Stool test  Thank you.

## 2017-05-29 ENCOUNTER — Encounter: Payer: BLUE CROSS/BLUE SHIELD | Admitting: Physical Medicine & Rehabilitation

## 2017-05-29 ENCOUNTER — Other Ambulatory Visit (INDEPENDENT_AMBULATORY_CARE_PROVIDER_SITE_OTHER): Payer: BLUE CROSS/BLUE SHIELD

## 2017-05-29 DIAGNOSIS — Z1211 Encounter for screening for malignant neoplasm of colon: Secondary | ICD-10-CM

## 2017-05-29 DIAGNOSIS — G839 Paralytic syndrome, unspecified: Secondary | ICD-10-CM

## 2017-05-29 LAB — FECAL OCCULT BLOOD, IMMUNOCHEMICAL: Fecal Occult Bld: POSITIVE — AB

## 2017-05-30 ENCOUNTER — Other Ambulatory Visit: Payer: Self-pay | Admitting: Internal Medicine

## 2017-06-02 NOTE — Telephone Encounter (Signed)
He is on omeprazole. Will not refill lansoprazole as well.

## 2017-06-12 ENCOUNTER — Ambulatory Visit: Payer: BLUE CROSS/BLUE SHIELD | Admitting: Physical Medicine & Rehabilitation

## 2017-06-12 ENCOUNTER — Encounter
Payer: BLUE CROSS/BLUE SHIELD | Attending: Physical Medicine & Rehabilitation | Admitting: Physical Medicine & Rehabilitation

## 2017-06-12 ENCOUNTER — Encounter: Payer: Self-pay | Admitting: Physical Medicine & Rehabilitation

## 2017-06-12 VITALS — BP 145/90 | HR 89 | Resp 14

## 2017-06-12 DIAGNOSIS — Z5181 Encounter for therapeutic drug level monitoring: Secondary | ICD-10-CM | POA: Insufficient documentation

## 2017-06-12 DIAGNOSIS — M25561 Pain in right knee: Secondary | ICD-10-CM | POA: Diagnosis not present

## 2017-06-12 DIAGNOSIS — G8929 Other chronic pain: Secondary | ICD-10-CM | POA: Diagnosis not present

## 2017-06-12 DIAGNOSIS — G894 Chronic pain syndrome: Secondary | ICD-10-CM | POA: Insufficient documentation

## 2017-06-12 DIAGNOSIS — Z79899 Other long term (current) drug therapy: Secondary | ICD-10-CM | POA: Diagnosis present

## 2017-06-12 NOTE — Progress Notes (Addendum)
Knee injection   Indication: Knee pain not relieved by medication management and other conservative care.  Informed consent was obtained after describing risks and benefits of the procedure with the patient, this includes bleeding, bruising, infection and medication side effects. The patient wishes to proceed and has given written consent. Patient was placed in a seated position. The medial knee was marked and prepped with betadine in the anteromedial area. Vapocoolant spray was applied. A 25-gauge 1-1/2 inch needle was inserted into the joint space. After negative draw back for blood, a solution of Zilretta was injected. A band aid was applied. The patient tolerated the procedure well. Post procedure instructions were given.

## 2017-06-20 ENCOUNTER — Encounter: Payer: Self-pay | Admitting: Physical Medicine & Rehabilitation

## 2017-06-20 ENCOUNTER — Encounter (HOSPITAL_BASED_OUTPATIENT_CLINIC_OR_DEPARTMENT_OTHER): Payer: BLUE CROSS/BLUE SHIELD | Admitting: Physical Medicine & Rehabilitation

## 2017-06-20 VITALS — BP 151/91 | HR 70

## 2017-06-20 DIAGNOSIS — I10 Essential (primary) hypertension: Secondary | ICD-10-CM

## 2017-06-20 DIAGNOSIS — G479 Sleep disorder, unspecified: Secondary | ICD-10-CM | POA: Diagnosis not present

## 2017-06-20 DIAGNOSIS — G8220 Paraplegia, unspecified: Secondary | ICD-10-CM

## 2017-06-20 DIAGNOSIS — R269 Unspecified abnormalities of gait and mobility: Secondary | ICD-10-CM

## 2017-06-20 DIAGNOSIS — G894 Chronic pain syndrome: Secondary | ICD-10-CM

## 2017-06-20 DIAGNOSIS — M791 Myalgia, unspecified site: Secondary | ICD-10-CM

## 2017-06-20 DIAGNOSIS — M25561 Pain in right knee: Secondary | ICD-10-CM | POA: Diagnosis not present

## 2017-06-20 DIAGNOSIS — G8929 Other chronic pain: Secondary | ICD-10-CM | POA: Diagnosis not present

## 2017-06-20 MED ORDER — TRAMADOL HCL 50 MG PO TABS: 100.0000 mg | ORAL_TABLET | Freq: Four times a day (QID) | ORAL | 0 refills | Status: DC | PRN

## 2017-06-20 NOTE — Progress Notes (Signed)
Subjective:    Patient ID: [name], male    DOB: 04-27-1951, 66 y.o.   MRN: [id number]  HPI 66 y/o male with pmh/psh of paraplegia, Etoh abuse, HTN, L-spine surgery, flap present for follow up of pain >> right knee.  Initially stated: Presented since ~1995.  Exacerbated since fracture in 2007. Stable since then.  Narcotics improve the pain.  Cold exacerbate the pain.  Achy.  Non-radiating.  He has sensation to his knees.  Intermittent.  Ice/heat do not help.  Denies falls. Pain makes things uncomfortable.  Last clinic visit 06/12/17. Wife provides much of history. At that time, he had a Zilretta injection, which he states brought the pain from a 9 to 5.  He states he uses the TENS now, which gives him good benefit.  He continues to take medications.  He did not follow up on blankets.   Pain Inventory Average Pain 5 Pain Right Now 5 My pain is constant and aching  In the last 24 hours, has pain interfered with the following? General activity 5 Relation with others 5 Enjoyment of life 5 What TIME of day is your pain at its worst? morning Sleep (in general) Fair  Pain is worse with: sitting, inactivity and lying down Pain improves with: nothing Relief from Meds: 0  Mobility use a wheelchair needs help with transfers  Function disabled: date disabled 10/1972 retired  Neuro/Psych weakness  Prior Studies Any changes since last visit?  no x-rays  Physicians involved in your care Any changes since last visit?  no   Family History  Problem Relation Age of Onset  . Stroke Mother   . Coronary artery disease Father   . Coronary artery disease Paternal Uncle   . Coronary artery disease Paternal Aunt    Social History   Social History  . Marital status: Married    Spouse name: N/A  . Number of children: 1  . Years of education: N/A   Occupational History  . DISABILITY Unemployed   Social History Main Topics  . Smoking status: Current Every Day Smoker   Packs/day: 0.50    Years: 40.00    Types: Cigarettes    Last attempt to quit: 08/09/2008  . Smokeless tobacco: Never Used     Comment: 1 PACK 2 DAYS  . Alcohol use No  . Drug use: No  . Sexual activity: Not Asked   Other Topics Concern  . None   Social History Narrative   Married, disabled, medicaid, functions independently at home, paraplegic      Family hx of : hypertension, diabetes, kidney disease, and other cancer   Past Surgical History:  Procedure Laterality Date  . BONE BIOPSY  11/2007   negative for organisms  . sacral wound flap  2002   done by Dr. Stephanie Coup  . SPINE SURGERY     Past Medical History:  Diagnosis Date  . Allergy   . C. difficile colitis 11/2008  . Decubitus ulcer 1999   excision of right ischial pressure sore with flap reconstruction done by Dr. Denese Killings 10/31/2008  . GERD (gastroesophageal reflux disease)   . Hyperlipidemia   . Hypertension   . Paraplegia ([place])    secondary to [place]  . Perineal abscess   . Substance abuse    alcohol   BP (!) 151/91   Pulse 70   SpO2 95%   Opioid Risk Score:   Fall Risk Score:  `1  Depression screen [facility] 2/9  Depression screen [facility] 2/9 03/14/2017 02/26/2017 02/05/2017 01/21/2017 01/08/2017 10/23/2016 11/08/2015  Decreased Interest 0 0 0 0 3 0 0  Down, Depressed, Hopeless 0 0 0 0 0 0 0  PHQ - 2 Score 0 0 0 0 3 0 0  Altered sleeping - - - - 3 - -  Tired, decreased energy - - - - 3 - -  Change in appetite - - - - 0 - -  Feeling bad or failure about yourself  - - - - 0 - -  Trouble concentrating - - - - 1 - -  Moving slowly or fidgety/restless - - - - 0 - -  Suicidal thoughts - - - - 0 - -  PHQ-9 Score - - - - 10 - -  Difficult doing work/chores - Somewhat difficult - Somewhat difficult Somewhat difficult - -  Some recent data might be hidden    Review of Systems  HENT: Negative.   Eyes: Negative.   Respiratory: Negative.   Cardiovascular: Negative.   Gastrointestinal: Negative.   Endocrine: Negative.     Genitourinary: Positive for difficulty urinating.  Musculoskeletal: Positive for arthralgias and myalgias.  Skin: Negative.   Allergic/Immunologic: Negative.   Neurological: Positive for weakness and numbness.  Hematological: Negative.   Psychiatric/Behavioral: Negative.   All other systems reviewed and are negative.     Objective:   Physical Exam Gen: NAD. Vital signs reviewed HENT: Normocephalic, Atraumatic Eyes: EOMI. No discharge.  Cardio: RRR. No JVD. Pulm: B/l clear to auscultation.  Effort normal Abd: Soft, BS+ MSK:  Gait nonambulatory.   No TTP.   Neuro:   Sensation absent below b/l knee, WNL proximally  Strength  RLE HF 2/5, distally 0/5    LLE HF/KE 2/5, distally 0/5 Skin: Warm and Dry. Intact. Healed scars.     Assessment & Plan:  66 y/o male with pmh/psh of paraplegia, Etoh abuse, HTN, L-spine surgery, flap present for follow up for pain >> right knee.  1. Chronic mechanical low back pain with chronic pain syndrome  Xray 10/2006 reviewed, revealing significant fractures  Heat/Cold ineffective for patient  Xray of right knee reviewed, chronic changes  D/ced Oxycodone  Cont bracing PRN  Cont Voltaren gel  Cont Lidoderm patch  Cont TENS IT  Cymbalta increased to 60mg   Cont Robaxin 500 BID PRN  Cont Tramadol to 100mg  TID PRN  Pt does better with heat - encouraged means of keeping knee warm with blankets, heating pads (with barrier), etc - again  Zilretta with benefit.    2. Gait abnormality  Cont wheelchair  3. Sleep disturbance  Variable, getting worse at present  4. Paraplegia  Being followed by PCP  Pt may follow if additional needs arise

## 2017-06-24 ENCOUNTER — Other Ambulatory Visit: Payer: Self-pay | Admitting: Internal Medicine

## 2017-07-02 DIAGNOSIS — N312 Flaccid neuropathic bladder, not elsewhere classified: Secondary | ICD-10-CM | POA: Diagnosis not present

## 2017-07-19 ENCOUNTER — Other Ambulatory Visit: Payer: Self-pay | Admitting: Internal Medicine

## 2017-07-19 DIAGNOSIS — F5102 Adjustment insomnia: Secondary | ICD-10-CM

## 2017-07-22 NOTE — Telephone Encounter (Signed)
Lorrin Mais called in to [facility] pharmacy.

## 2017-07-25 ENCOUNTER — Telehealth: Payer: Self-pay | Admitting: Gastroenterology

## 2017-07-25 ENCOUNTER — Encounter (HOSPITAL_COMMUNITY): Payer: Self-pay | Admitting: *Deleted

## 2017-07-25 NOTE — Progress Notes (Signed)
In speaking with patient's wife wfie stated patient was admitted 10 years ago when he had last colonoscopy done he had to be admitted day before because he is paraplegic to bowel prep and then discharged the day after.  Called and left message at Dr Havery Moros office and nurse is to call me back.

## 2017-07-25 NOTE — Telephone Encounter (Signed)
Spoke to [place] at [phone], let her know that we had planned on admitting the patient the day before. Our PA will take care of handling the admission order on 8/30.

## 2017-07-25 NOTE — Telephone Encounter (Signed)
Sent staff message to Nevin Bloodgood about admitting patient on 8/30.

## 2017-07-30 DIAGNOSIS — N312 Flaccid neuropathic bladder, not elsewhere classified: Secondary | ICD-10-CM | POA: Diagnosis not present

## 2017-07-31 ENCOUNTER — Ambulatory Visit (AMBULATORY_SURGERY_CENTER): Payer: Self-pay

## 2017-07-31 VITALS — Ht 73.0 in | Wt 185.0 lb

## 2017-07-31 DIAGNOSIS — Z1211 Encounter for screening for malignant neoplasm of colon: Secondary | ICD-10-CM

## 2017-07-31 NOTE — Progress Notes (Signed)
No allergies to eggs or soy No diet meds No home oxygen No past problems with anesthesia Has a "flap in my throat that they put in years ago"  Declined emmi

## 2017-08-05 ENCOUNTER — Telehealth: Payer: Self-pay | Admitting: Gastroenterology

## 2017-08-05 NOTE — Telephone Encounter (Signed)
Sent Shawn [name] a message to see what time she would suggest patient arrive at the hospital on 8/30.

## 2017-08-06 ENCOUNTER — Telehealth: Payer: Self-pay | Admitting: Gastroenterology

## 2017-08-06 NOTE — Telephone Encounter (Signed)
Spoke to [place], patient's wife, let her know that Nevin Bloodgood said to arrive at [facility] hospital on 08/07/17 around 2:30. She understands that he should be on clear liquids for the entire day, verbally went over that list of okay drinks. The hospital staff will handle doing the rest of the prep.

## 2017-08-08 ENCOUNTER — Other Ambulatory Visit: Payer: Self-pay

## 2017-08-08 ENCOUNTER — Ambulatory Visit (HOSPITAL_COMMUNITY)
Admission: RE | Admit: 2017-08-08 | Payer: BLUE CROSS/BLUE SHIELD | Source: Ambulatory Visit | Admitting: Gastroenterology

## 2017-08-08 DIAGNOSIS — Z1211 Encounter for screening for malignant neoplasm of colon: Secondary | ICD-10-CM

## 2017-08-08 DIAGNOSIS — R195 Other fecal abnormalities: Secondary | ICD-10-CM

## 2017-08-08 HISTORY — DX: Unspecified osteoarthritis, unspecified site: M19.90

## 2017-08-08 SURGERY — COLONOSCOPY
Anesthesia: Monitor Anesthesia Care

## 2017-08-11 ENCOUNTER — Other Ambulatory Visit: Payer: Self-pay | Admitting: Physical Medicine & Rehabilitation

## 2017-08-24 ENCOUNTER — Other Ambulatory Visit: Payer: Self-pay | Admitting: Internal Medicine

## 2017-08-25 ENCOUNTER — Encounter (HOSPITAL_COMMUNITY): Payer: Self-pay

## 2017-08-25 ENCOUNTER — Inpatient Hospital Stay (HOSPITAL_COMMUNITY)
Admission: AD | Admit: 2017-08-25 | Discharge: 2017-08-27 | DRG: 394 | Disposition: A | Discharge status: Home / Self Care | Payer: Medicare Other | Source: Ambulatory Visit | Attending: Gastroenterology | Admitting: Gastroenterology

## 2017-08-25 DIAGNOSIS — K219 Gastro-esophageal reflux disease without esophagitis: Secondary | ICD-10-CM | POA: Diagnosis present

## 2017-08-25 DIAGNOSIS — I10 Essential (primary) hypertension: Secondary | ICD-10-CM | POA: Diagnosis present

## 2017-08-25 DIAGNOSIS — D120 Benign neoplasm of cecum: Principal | ICD-10-CM | POA: Diagnosis present

## 2017-08-25 DIAGNOSIS — D122 Benign neoplasm of ascending colon: Secondary | ICD-10-CM

## 2017-08-25 DIAGNOSIS — F17210 Nicotine dependence, cigarettes, uncomplicated: Secondary | ICD-10-CM | POA: Diagnosis present

## 2017-08-25 DIAGNOSIS — K644 Residual hemorrhoidal skin tags: Secondary | ICD-10-CM | POA: Diagnosis present

## 2017-08-25 DIAGNOSIS — R195 Other fecal abnormalities: Secondary | ICD-10-CM

## 2017-08-25 DIAGNOSIS — Z880 Allergy status to penicillin: Secondary | ICD-10-CM

## 2017-08-25 DIAGNOSIS — Z79899 Other long term (current) drug therapy: Secondary | ICD-10-CM

## 2017-08-25 DIAGNOSIS — Z1211 Encounter for screening for malignant neoplasm of colon: Secondary | ICD-10-CM

## 2017-08-25 DIAGNOSIS — E785 Hyperlipidemia, unspecified: Secondary | ICD-10-CM | POA: Diagnosis present

## 2017-08-25 DIAGNOSIS — G8220 Paraplegia, unspecified: Secondary | ICD-10-CM | POA: Diagnosis present

## 2017-08-25 MED ORDER — PEG-KCL-NACL-NASULF-NA ASC-C 100 G PO SOLR
0.5000 | Freq: Once | ORAL | Status: AC
  Administered 2017-08-26: 100 g via ORAL

## 2017-08-25 MED ORDER — PANTOPRAZOLE SODIUM 40 MG PO TBEC
40.0000 mg | DELAYED_RELEASE_TABLET | Freq: Every day | ORAL | Status: DC
  Administered 2017-08-26 – 2017-08-27 (×2): 40 mg via ORAL
  Filled 2017-08-25 (×2): qty 1

## 2017-08-25 MED ORDER — SODIUM CHLORIDE 0.9 % IV SOLN
INTRAVENOUS | Status: DC
  Administered 2017-08-25 – 2017-08-26 (×2): via INTRAVENOUS
  Administered 2017-08-27: 75 mL/h via INTRAVENOUS

## 2017-08-25 MED ORDER — HYDROCHLOROTHIAZIDE 25 MG PO TABS
25.0000 mg | ORAL_TABLET | Freq: Every day | ORAL | Status: DC
Start: 2017-08-26 — End: 2017-08-27
  Administered 2017-08-26 – 2017-08-27 (×2): 25 mg via ORAL
  Filled 2017-08-25 (×2): qty 1

## 2017-08-25 MED ORDER — LOSARTAN POTASSIUM 50 MG PO TABS
50.0000 mg | ORAL_TABLET | Freq: Every day | ORAL | Status: DC
Start: 2017-08-26 — End: 2017-08-27
  Administered 2017-08-26 – 2017-08-27 (×2): 50 mg via ORAL
  Filled 2017-08-25 (×2): qty 1

## 2017-08-25 MED ORDER — TRAMADOL HCL 50 MG PO TABS
100.0000 mg | ORAL_TABLET | Freq: Four times a day (QID) | ORAL | Status: DC | PRN
  Administered 2017-08-26 (×2): 100 mg via ORAL
  Filled 2017-08-25 (×2): qty 2

## 2017-08-25 MED ORDER — ONDANSETRON HCL 4 MG PO TABS: 4.0000 mg | ORAL_TABLET | Freq: Four times a day (QID) | ORAL | Status: DC | PRN

## 2017-08-25 MED ORDER — LISINOPRIL 20 MG PO TABS: 40.0000 mg | ORAL_TABLET | Freq: Every day | ORAL | Status: DC

## 2017-08-25 MED ORDER — PEG-KCL-NACL-NASULF-NA ASC-C 100 G PO SOLR
0.5000 | Freq: Once | ORAL | Status: AC
  Administered 2017-08-25: 100 g via ORAL
  Filled 2017-08-25: qty 1

## 2017-08-25 MED ORDER — METHOCARBAMOL 500 MG PO TABS
500.0000 mg | ORAL_TABLET | Freq: Two times a day (BID) | ORAL | Status: DC | PRN
  Administered 2017-08-26: 500 mg via ORAL
  Filled 2017-08-25: qty 1

## 2017-08-25 MED ORDER — AMLODIPINE BESYLATE 10 MG PO TABS
10.0000 mg | ORAL_TABLET | Freq: Every day | ORAL | Status: DC
  Administered 2017-08-26 – 2017-08-27 (×2): 10 mg via ORAL
  Filled 2017-08-25 (×2): qty 1

## 2017-08-25 MED ORDER — ACETAMINOPHEN 325 MG PO TABS
650.0000 mg | ORAL_TABLET | Freq: Four times a day (QID) | ORAL | Status: DC | PRN
  Administered 2017-08-26: 650 mg via ORAL
  Filled 2017-08-25: qty 2

## 2017-08-25 MED ORDER — ACETAMINOPHEN 650 MG RE SUPP: 650.0000 mg | Freq: Four times a day (QID) | RECTAL | Status: DC | PRN

## 2017-08-25 MED ORDER — PEG-KCL-NACL-NASULF-NA ASC-C 100 G PO SOLR: 1.0000 | Freq: Once | ORAL | Status: DC

## 2017-08-25 MED ORDER — ONDANSETRON HCL 4 MG/2ML IJ SOLN
4.0000 mg | Freq: Four times a day (QID) | INTRAMUSCULAR | Status: DC | PRN
  Administered 2017-08-27: 4 mg via INTRAVENOUS

## 2017-08-25 NOTE — H&P (Signed)
Primary Care Physician:  Maryellen Pile, MD Primary Gastroenterologist:  Jolly Mango, MD  CHIEF COMPLAINT:   Colonoscopy for colon cancer screening.  Paraplegia, needs assistance with bowel prep.    HPI: Shawn [name]. is a 67 y.o. male with paraplegia since age 48 due to [place]. He saw [place] in the office in June to discuss colon cancer screening but bowel preparation was going to be very difficult to do at home since he is non-ambulatory. His last colonoscopy was in 2008 and he had no polyps. Mr. Weaver has no gastrointestinal or general medical complaints.    Past Medical History:  Diagnosis Date  . Allergy   . Arthritis   . C. difficile colitis 11/2008  . Decubitus ulcer 1999   excision of right ischial pressure sore with flap reconstruction done by Dr. Denese Killings 10/31/2008  . GERD (gastroesophageal reflux disease)   . Hyperlipidemia   . Hypertension   . Paraplegia ([place])    secondary to [place]  . Perineal abscess   . Substance abuse    alcohol    Past Surgical History:  Procedure Laterality Date  . BONE BIOPSY  11/2007   negative for organisms  . sacral wound flap  2002   done by Dr. Stephanie Coup  . SPINE SURGERY    . THROAT SURGERY      Prior to Admission medications   Medication Sig Start Date End Date Taking? Authorizing Provider  amLODipine (NORVASC) 10 MG tablet Take 1 tablet (10 mg total) by mouth daily. 01/21/17 01/21/18 Yes Maryellen Pile, MD  diclofenac sodium (VOLTAREN) 1 % GEL APPLY TOPICALLY 4 TIMES DAILY. Patient taking differently: APPLY TOPICALLY 4 TIMES DAILY AS NEEDED FOR PAIN 05/09/17  Yes Jamse Arn, MD  ezetimibe (ZETIA) 10 MG tablet Take 1 tablet (10 mg total) by mouth daily. 02/26/17  Yes Maryellen Pile, MD  hydrochlorothiazide (HYDRODIURIL) 25 MG tablet TAKE 1 TABLET (25 MG TOTAL) BY MOUTH DAILY. 11/13/16  Yes Maryellen Pile, MD  lidocaine (LIDODERM) 5 % Place 1 patch onto the skin daily. Remove & Discard patch within 12 hours or as directed by  MD Patient taking differently: Place 1 patch onto the skin daily as needed (pain). Remove & Discard patch within 12 hours or as directed by MD 02/28/17  Yes Jamse Arn, MD  lisinopril (PRINIVIL,ZESTRIL) 40 MG tablet Take 40 mg by mouth daily.  02/28/17  Yes [provider]  losartan (COZAAR) 50 MG tablet Take 1 tablet (50 mg total) by mouth daily. 04/21/17 04/21/18 Yes Maryellen Pile, MD  methocarbamol (ROBAXIN) 500 MG tablet Take 1 tablet (500 mg total) by mouth 2 (two) times daily as needed for muscle spasms. 05/15/17  Yes Jamse Arn, MD  Multiple Vitamin (MULTIVITAMIN WITH MINERALS) TABS Take 1 tablet by mouth daily.   Yes [provider]  omeprazole (PRILOSEC) 40 MG capsule TAKE 1 CAPSULE BY MOUTH EVERY DAY 05/02/17  Yes Maryellen Pile, MD  traMADol (ULTRAM) 50 MG tablet TAKE 2 TABLETS BY MOUTH EVERY 6 HOURS AS NEEDED Patient taking differently: TAKE 2 TABLETS BY MOUTH EVERY 6 HOURS AS NEEDED for pain 08/12/17  Yes Jamse Arn, MD    No current facility-administered medications for this encounter.     Allergies as of 08/08/2017 - Review Complete 08/05/2017  Allergen Reaction Noted  . Penicillins Hives 09/15/2013    Family History  Problem Relation Age of Onset  . Stroke Mother   . Coronary artery disease Father   .  Coronary artery disease Paternal Uncle   . Coronary artery disease Paternal Aunt     Social History   Social History  . Marital status: Married    Spouse name: N/A  . Number of children: 1  . Years of education: N/A   Occupational History  . DISABILITY Unemployed   Social History Main Topics  . Smoking status: Current Every Day Smoker    Packs/day: 0.50    Years: 40.00    Types: Cigarettes  . Smokeless tobacco: Never Used     Comment: 1 PACK 2 DAYS  . Alcohol use No  . Drug use: No  . Sexual activity: Not on file   Other Topics Concern  . Not on file   Social History Narrative   Married, disabled, medicaid, functions  independently at home, paraplegic      Family hx of : hypertension, diabetes, kidney disease, and other cancer    Review of Systems: All systems reviewed and negative except where noted in HPI  Physical Exam: Vital signs in last 24 hours: Temp:  [97.8 F (36.6 C)] 97.8 F (36.6 C) (09/17 1524) Pulse Rate:  [69] 69 (09/17 1524) Resp:  [16] 16 (09/17 1524) BP: (115)/(74) 115/74 (09/17 1524) SpO2:  [97 %] 97 % (09/17 1524) Weight:  [180 lb (81.6 kg)] 180 lb (81.6 kg) (09/17 1524)   General:   Alert, well-developed, black male in NAD Eyes:  Sclera clear, no icterus.   Conjunctiva pink. Ears:  Normal auditory acuity. Nose:  No deformity, discharge,  or lesions. Neck:  Supple; no masses Lungs:  Clear throughout to auscultation.   No wheezes, crackles, or rhonchi. No acute distress. Heart:  Regular rate and rhythm; no murmurs, clicks, rubs,  or gallops. No edema Abdomen:  Soft, nontender and nondistended. No masses, hepatosplenomegaly or hernias noted. Normal bowel sounds, without guarding, and without rebound.   Rectal:  Deferred until time of colonoscopy.   Neurologic:  Alert and  oriented x4;  grossly normal neurologically. Skin:  Intact without significant lesions or rashes. Cervical Nodes:  No significant cervical adenopathy. Psych:  Alert and cooperative. Normal mood and affect.  Impression / Plan:  20. 66 yo male with paraplegia in need of assistance with bowel prep for am colonoscopy.  -place in observation -clear liquids, NPO after midnight -continue any essential home medicaions.  Start bowel prep this evening.  -hopefully home tomorrow following colonoscopy .    LOS: 0 days   Tye Savoy  08/25/2017, 3:46 PM  Agree with assessment and plan as outlined.   [place] Cellar, MD [facility] Gastroenterology Pager [phone]

## 2017-08-25 NOTE — Telephone Encounter (Signed)
Lisinopril was discontinued and switched to Losartan. Can we confirm that he is not taking both Lisinopril and Losartan? Thanks.

## 2017-08-25 NOTE — Telephone Encounter (Signed)
Patient not taking lisinopril, aware he's supposed to take losartan. (refill request was pharmacy error)

## 2017-08-26 ENCOUNTER — Observation Stay (HOSPITAL_COMMUNITY): Payer: Medicare Other | Admitting: Anesthesiology

## 2017-08-26 DIAGNOSIS — D122 Benign neoplasm of ascending colon: Secondary | ICD-10-CM | POA: Diagnosis present

## 2017-08-26 DIAGNOSIS — Z1211 Encounter for screening for malignant neoplasm of colon: Secondary | ICD-10-CM | POA: Diagnosis not present

## 2017-08-26 DIAGNOSIS — Z880 Allergy status to penicillin: Secondary | ICD-10-CM | POA: Diagnosis not present

## 2017-08-26 DIAGNOSIS — Z79899 Other long term (current) drug therapy: Secondary | ICD-10-CM | POA: Diagnosis not present

## 2017-08-26 DIAGNOSIS — I10 Essential (primary) hypertension: Secondary | ICD-10-CM | POA: Diagnosis present

## 2017-08-26 DIAGNOSIS — G8220 Paraplegia, unspecified: Secondary | ICD-10-CM | POA: Diagnosis present

## 2017-08-26 DIAGNOSIS — K219 Gastro-esophageal reflux disease without esophagitis: Secondary | ICD-10-CM | POA: Diagnosis present

## 2017-08-26 DIAGNOSIS — E785 Hyperlipidemia, unspecified: Secondary | ICD-10-CM | POA: Diagnosis present

## 2017-08-26 DIAGNOSIS — K644 Residual hemorrhoidal skin tags: Secondary | ICD-10-CM | POA: Diagnosis present

## 2017-08-26 DIAGNOSIS — F17210 Nicotine dependence, cigarettes, uncomplicated: Secondary | ICD-10-CM | POA: Diagnosis present

## 2017-08-26 DIAGNOSIS — D649 Anemia, unspecified: Secondary | ICD-10-CM | POA: Diagnosis not present

## 2017-08-26 DIAGNOSIS — D120 Benign neoplasm of cecum: Secondary | ICD-10-CM | POA: Diagnosis present

## 2017-08-26 DIAGNOSIS — N312 Flaccid neuropathic bladder, not elsewhere classified: Secondary | ICD-10-CM | POA: Diagnosis not present

## 2017-08-26 LAB — CBC
HCT: 42.8 % (ref 39.0–52.0)
Hemoglobin: 15.4 g/dL (ref 13.0–17.0)
MCH: 30.7 pg (ref 26.0–34.0)
MCHC: 36 g/dL (ref 30.0–36.0)
MCV: 85.4 fL (ref 78.0–100.0)
PLATELETS: 273 10*3/uL (ref 150–400)
RBC: 5.01 MIL/uL (ref 4.22–5.81)
RDW: 14.3 % (ref 11.5–15.5)
WBC: 8.2 10*3/uL (ref 4.0–10.5)

## 2017-08-26 MED ORDER — SODIUM CHLORIDE 0.9 % IV SOLN: INTRAVENOUS | Status: DC

## 2017-08-26 MED ORDER — PEG-KCL-NACL-NASULF-NA ASC-C 100 G PO SOLR
1.0000 | Freq: Once | ORAL | Status: AC
  Administered 2017-08-27: 200 g via ORAL
  Filled 2017-08-26: qty 1

## 2017-08-26 MED ORDER — PROPOFOL 10 MG/ML IV BOLUS
INTRAVENOUS | Status: AC
  Filled 2017-08-26: qty 40

## 2017-08-26 MED ORDER — PEG-KCL-NACL-NASULF-NA ASC-C 100 G PO SOLR
1.0000 | Freq: Once | ORAL | Status: AC
  Administered 2017-08-26: 200 g via ORAL
  Filled 2017-08-26: qty 1

## 2017-08-26 NOTE — Progress Notes (Signed)
Second tap water enema given; awaiting results. Donne Hazel, RN

## 2017-08-26 NOTE — Anesthesia Preprocedure Evaluation (Deleted)
Anesthesia Evaluation  Patient identified by MRN, date of birth, ID band Patient awake    Reviewed: Allergy & Precautions, NPO status , Patient's Chart, lab work & pertinent test results  Airway Mallampati: II  TM Distance: >3 FB Neck ROM: Full    Dental no notable dental hx.    Pulmonary neg pulmonary ROS, Current Smoker,    Pulmonary exam normal breath sounds clear to auscultation       Cardiovascular hypertension, negative cardio ROS Normal cardiovascular exam Rhythm:Regular Rate:Normal     Neuro/Psych negative neurological ROS  negative psych ROS   GI/Hepatic negative GI ROS, Neg liver ROS, GERD  ,  Endo/Other  negative endocrine ROS  Renal/GU negative Renal ROS  negative genitourinary   Musculoskeletal negative musculoskeletal ROS (+) Arthritis ,   Abdominal   Peds negative pediatric ROS (+)  Hematology negative hematology ROS (+)   Anesthesia Other Findings paraplegia  Reproductive/Obstetrics negative OB ROS                             Anesthesia Physical Anesthesia Plan  ASA: III  Anesthesia Plan: MAC   Post-op Pain Management:    Induction: Intravenous  PONV Risk Score and Plan: 0  Airway Management Planned: Nasal Cannula  Additional Equipment:   Intra-op Plan:   Post-operative Plan:   Informed Consent: I have reviewed the patients History and Physical, chart, labs and discussed the procedure including the risks, benefits and alternatives for the proposed anesthesia with the patient or authorized representative who has indicated his/her understanding and acceptance.   Dental advisory given  Plan Discussed with: CRNA  Anesthesia Plan Comments:         Anesthesia Quick Evaluation

## 2017-08-26 NOTE — Progress Notes (Signed)
Pearletha Furl return from enemas is large amounts of liquid brown stool. Donne Hazel, RN
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
## 2017-08-26 NOTE — Progress Notes (Signed)
Tap water enema given with brown liquid return of stool. Donne Hazel, RN

## 2017-08-27 ENCOUNTER — Inpatient Hospital Stay (HOSPITAL_COMMUNITY): Payer: Medicare Other | Admitting: Anesthesiology

## 2017-08-27 ENCOUNTER — Encounter (HOSPITAL_COMMUNITY)
Admission: AD | Disposition: A | Discharge status: Home / Self Care | Payer: Self-pay | Source: Ambulatory Visit | Attending: Gastroenterology

## 2017-08-27 ENCOUNTER — Encounter (HOSPITAL_COMMUNITY): Payer: Self-pay

## 2017-08-27 DIAGNOSIS — D122 Benign neoplasm of ascending colon: Secondary | ICD-10-CM | POA: Diagnosis not present

## 2017-08-27 DIAGNOSIS — D120 Benign neoplasm of cecum: Principal | ICD-10-CM

## 2017-08-27 DIAGNOSIS — N312 Flaccid neuropathic bladder, not elsewhere classified: Secondary | ICD-10-CM | POA: Diagnosis not present

## 2017-08-27 DIAGNOSIS — R195 Other fecal abnormalities: Secondary | ICD-10-CM

## 2017-08-27 HISTORY — PX: COLONOSCOPY WITH PROPOFOL: SHX5780

## 2017-08-27 SURGERY — COLONOSCOPY WITH PROPOFOL
Anesthesia: Monitor Anesthesia Care

## 2017-08-27 SURGERY — CANCELLED PROCEDURE
Anesthesia: Monitor Anesthesia Care

## 2017-08-27 MED ORDER — PROPOFOL 500 MG/50ML IV EMUL
INTRAVENOUS | Status: DC | PRN
  Administered 2017-08-27: 150 ug/kg/min via INTRAVENOUS

## 2017-08-27 MED ORDER — SODIUM CHLORIDE 0.9 % IJ SOLN
INTRAMUSCULAR | Status: AC
  Filled 2017-08-27: qty 10

## 2017-08-27 MED ORDER — LIDOCAINE 2% (20 MG/ML) 5 ML SYRINGE
INTRAMUSCULAR | Status: AC
  Filled 2017-08-27: qty 5

## 2017-08-27 MED ORDER — LACTATED RINGERS IV SOLN
INTRAVENOUS | Status: DC
  Administered 2017-08-27: 1000 mL via INTRAVENOUS

## 2017-08-27 MED ORDER — PROPOFOL 10 MG/ML IV BOLUS
INTRAVENOUS | Status: AC
  Filled 2017-08-27: qty 20

## 2017-08-27 MED ORDER — LIDOCAINE 2% (20 MG/ML) 5 ML SYRINGE
INTRAMUSCULAR | Status: DC | PRN
Start: 2017-08-27 — End: 2017-08-27
  Administered 2017-08-27: 75 mg via INTRAVENOUS

## 2017-08-27 MED ORDER — ONDANSETRON HCL 4 MG/2ML IJ SOLN
INTRAMUSCULAR | Status: AC
  Filled 2017-08-27: qty 2

## 2017-08-27 MED ORDER — PHENYLEPHRINE 40 MCG/ML (10ML) SYRINGE FOR IV PUSH (FOR BLOOD PRESSURE SUPPORT)
PREFILLED_SYRINGE | INTRAVENOUS | Status: DC | PRN
  Administered 2017-08-27 (×5): 80 ug via INTRAVENOUS

## 2017-08-27 MED ORDER — PROPOFOL 10 MG/ML IV BOLUS
INTRAVENOUS | Status: DC | PRN
  Administered 2017-08-27 (×3): 20 mg via INTRAVENOUS

## 2017-08-27 MED ORDER — PROPOFOL 10 MG/ML IV BOLUS
INTRAVENOUS | Status: AC
  Filled 2017-08-27: qty 40

## 2017-08-27 MED ORDER — PHENYLEPHRINE 40 MCG/ML (10ML) SYRINGE FOR IV PUSH (FOR BLOOD PRESSURE SUPPORT)
PREFILLED_SYRINGE | INTRAVENOUS | Status: AC
  Filled 2017-08-27: qty 10

## 2017-08-27 SURGICAL SUPPLY — 22 items

## 2017-08-27 NOTE — Progress Notes (Signed)
Discharge instructions reviewed with patient and spouse. Questions answered and both deny further questions. Spouse is driving patient home. No prescriptions given to patient. Donne Hazel, RN

## 2017-08-27 NOTE — Interval H&P Note (Signed)
History and Physical Interval Note:  08/27/2017 9:48 AM  Shawn [name].  has presented today for surgery, with the diagnosis of positive FIT test  The various methods of treatment have been discussed with the patient and family. After consideration of risks, benefits and other options for treatment, the patient has consented to  Procedure(s): COLONOSCOPY WITH PROPOFOL (N/A) as a surgical intervention .  The patient's history has been reviewed, patient examined, no change in status, stable for surgery.  I have reviewed the patient's chart and labs.  Questions were answered to the patient's satisfaction.     Scarlette Shorts

## 2017-08-27 NOTE — Op Note (Signed)
[facility] Patient Name: Shawn [name] Procedure Date: 08/27/2017 MRN: [id number] Attending MD: Docia Chuck. Henrene Pastor , MD Date of Birth: 1951-11-26 CSN: [id number] Age: 66 Admit Type: Inpatient Procedure:                Colonoscopy, With snare polypectomy x 2 Indications:              Positive Cologuard test Providers:                Docia Chuck. Henrene Pastor, MD, Zenon Mayo, RN, Cherylynn Ridges,                            Technician, [place] Circle. Zenia Resides CRNA, CRNA Referring MD:             Carlota Raspberry. [place] MD, Medicines:                Monitored Anesthesia Care Complications:            No immediate complications. Estimated blood loss:                            None. Estimated Blood Loss:     Estimated blood loss: none. Procedure:                Pre-Anesthesia Assessment:                           - Prior to the procedure, a History and Physical                            was performed, and patient medications and                            allergies were reviewed. The patient's tolerance of                            previous anesthesia was also reviewed. The risks                            and benefits of the procedure and the sedation                            options and risks were discussed with the patient.                            All questions were answered, and informed consent                            was obtained. Prior Anticoagulants: The patient has                            taken no previous anticoagulant or antiplatelet                            agents. ASA Grade Assessment: III - A patient with  severe systemic disease. After reviewing the risks                            and benefits, the patient was deemed in                            satisfactory condition to undergo the procedure.                           After obtaining informed consent, the colonoscope                            was passed under direct vision. Throughout the                          procedure, the patient's blood pressure, pulse, and                            oxygen saturations were monitored continuously. The                            EC-[id number] ([id number]) scope was introduced through                            the anus and advanced to the the cecum, identified                            by appendiceal orifice and ileocecal valve. The                            ileocecal valve, appendiceal orifice, and rectum                            were photographed. The quality of the bowel                            preparation was good. The colonoscopy was performed                            without difficulty. The patient tolerated the                            procedure well. The bowel preparation used was [place]. Scope In: 10:14:03 AM Scope Out: 54:65:68 AM Scope Withdrawal Time: 0 hours 22 minutes 50 seconds  Total Procedure Duration: 0 hours 34 minutes 9 seconds  Findings:      Two sessile polyps were found in the ascending colon and cecum. The       polyps were 5 to 10 mm in size. These polyps were removed with a cold       snare. Resection and retrieval were complete.      External hemorrhoids were found during retroflexion.  The exam was otherwise without abnormality on direct and retroflexion       views. Impression:               - Two 5 and 10 mm polyps in the ascending colon and                            in the cecum, respectively, removed with a cold                            snare. Resected and retrieved.                           - External hemorrhoids.                           - The examination was otherwise normal on direct                            and retroflexion views. Moderate Sedation:      none Recommendation:           - Repeat colonoscopy in 3 years for surveillance,                            with Dr. Havery Moros.                           - Patient has a contact number  available for                            emergencies. The signs and symptoms of potential                            delayed complications were discussed with the                            patient. Return to normal activities tomorrow.                            Written discharge instructions were provided to the                            patient.                           - Resume previous diet.                           - Continue present medications.                           - Await pathology results. Procedure Code(s):        --- Professional ---                           [phone], Colonoscopy, flexible; with removal of  tumor(s), polyp(s), or other lesion(s) by snare                            technique Diagnosis Code(s):        --- Professional ---                           D12.2, Benign neoplasm of ascending colon                           D12.0, Benign neoplasm of cecum                           K64.4, Residual hemorrhoidal skin tags                           R19.5, Other fecal abnormalities CPT copyright 2016 [employer]. All rights reserved. The codes documented in this report are preliminary and upon coder review may  be revised to meet current compliance requirements. Docia Chuck. Henrene Pastor, MD 08/27/2017 11:00:37 AM This report has been signed electronically. Number of Addenda: 0

## 2017-08-27 NOTE — Transfer of Care (Signed)
Immediate Anesthesia Transfer of Care Note  Patient: Shawn [name].  Procedure(s) Performed: Procedure(s): COLONOSCOPY WITH PROPOFOL (N/A)  Patient Location: PACU and Endoscopy Unit  Anesthesia Type:MAC  Level of Consciousness: awake, alert , oriented and patient cooperative  Airway & Oxygen Therapy: Patient Spontanous Breathing and Patient connected to face mask oxygen  Post-op Assessment: Report given to RN and Post -op Vital signs reviewed and stable  Post vital signs: Reviewed and stable  Last Vitals:  Vitals:   08/27/17 0434 08/27/17 0921  BP: (!) 161/92 (!) 169/110  Pulse: 79 85  Resp: 18 17  Temp: 36.5 C 36.4 C  SpO2: 100% 99%    Last Pain:  Vitals:   08/27/17 0921  TempSrc: Oral  PainSc:       Patients Stated Pain Goal: 2 (37/94/32 7614)  Complications: No apparent anesthesia complications

## 2017-08-27 NOTE — Anesthesia Preprocedure Evaluation (Addendum)
Anesthesia Evaluation  Patient identified by MRN, date of birth, ID band Patient awake    Reviewed: Allergy & Precautions, NPO status , Patient's Chart, lab work & pertinent test results  Airway Mallampati: I  TM Distance: >3 FB Neck ROM: Full    Dental  (+) Edentulous Upper, Edentulous Lower   Pulmonary Current Smoker,    breath sounds clear to auscultation       Cardiovascular hypertension,  Rhythm:Regular Rate:Normal     Neuro/Psych paraplegia    GI/Hepatic GERD  ,  Endo/Other    Renal/GU      Musculoskeletal  (+) Arthritis ,   Abdominal   Peds  Hematology   Anesthesia Other Findings   Reproductive/Obstetrics                            Anesthesia Physical Anesthesia Plan  ASA: III  Anesthesia Plan: MAC   Post-op Pain Management:    Induction: Intravenous  PONV Risk Score and Plan: 0  Airway Management Planned: Natural Airway and Simple Face Mask  Additional Equipment:   Intra-op Plan:   Post-operative Plan:   Informed Consent: I have reviewed the patients History and Physical, chart, labs and discussed the procedure including the risks, benefits and alternatives for the proposed anesthesia with the patient or authorized representative who has indicated his/her understanding and acceptance.     Plan Discussed with: CRNA  Anesthesia Plan Comments:         Anesthesia Quick Evaluation

## 2017-08-27 NOTE — Anesthesia Postprocedure Evaluation (Signed)
Anesthesia Post Note  Patient: Shawn [name].  Procedure(s) Performed: Procedure(s) (LRB): COLONOSCOPY WITH PROPOFOL (N/A)     Patient location during evaluation: Endoscopy Anesthesia Type: MAC Level of consciousness: awake and alert Pain management: pain level controlled Vital Signs Assessment: post-procedure vital signs reviewed and stable Respiratory status: spontaneous breathing, nonlabored ventilation, respiratory function stable and patient connected to nasal cannula oxygen Cardiovascular status: stable and blood pressure returned to baseline Postop Assessment: no apparent nausea or vomiting Anesthetic complications: no    Last Vitals:  Vitals:   08/27/17 1117 08/27/17 1134  BP: (!) 177/94 (!) 143/84  Pulse: 68 68  Resp: 13 14  Temp:  36.8 C  SpO2: 96%     Last Pain:  Vitals:   08/27/17 1134  TempSrc: Oral  PainSc:                  Ceclia Koker,JAMES TERRILL

## 2017-08-27 NOTE — Progress Notes (Signed)
Continues to have brown stools. Donne Hazel, RN

## 2017-08-27 NOTE — Discharge Summary (Signed)
Shawn [name]  Name: Shawn [name]. MRN: [id number] DOB: 06-23-1951 66 y.o. PCP:  Shawn Pile, MD  Date of Admission: 08/25/2017  3:03 PM Date of Discharge: 08/27/2017 Attending Physician: No att. providers found  Discharge Diagnosis: Active Problems:   Colon cancer screening   Positive FIT (fecal immunochemical test)   Benign neoplasm of cecum   Benign neoplasm of ascending colon  Consultations: None  Procedures Performed:  No results found.  GI Procedures: Colonoscopy 9/19 by Dr. Henrene Pastor with 2 polyps removed, one was 5 mm and one was 10 mm.  History/Physical Exam:  See Admission H&P  Admission HPI: Patient was admitted for colonoscopy.  Required 2 day bowel prep.  Underwent procedure on 9/19, which he tolerated well.  Had 2 polyps removed as above.  Discharge Vitals:  BP (!) 143/84 (BP Location: Right Arm)   Pulse 68   Temp 98.3 F (36.8 C) (Oral)   Resp 14   Ht 6\' 1"  (1.854 m)   Wt 180 lb (81.6 kg)   SpO2 96%   BMI 23.75 kg/m   Discharge Labs: No results found for this or any previous visit (from the past 24 hour(s)).  Disposition and follow-up:   [name] was discharged from [facility] in stable condition.    Follow-up Appointments: Discharge Instructions    Discharge instructions    Complete by:  As directed    Resume previous diet.   Increase activity slowly    Complete by:  As directed       Discharge Medications: Allergies as of 08/27/2017      Reactions   Penicillins Hives   Has patient had a PCN reaction causing immediate rash, facial/tongue/throat swelling, SOB or lightheadedness with hypotension: Yes Has patient had a PCN reaction causing severe rash involving mucus membranes or skin necrosis: Unknown Has patient had a PCN reaction that required hospitalization: No Has patient had a PCN reaction occurring within the last 10 years: Yes If all of the above answers are "NO", then may  proceed with Cephalosporin use.      Medication List    TAKE these medications   amLODipine 10 MG tablet Commonly known as:  NORVASC Take 1 tablet (10 mg total) by mouth daily.   diclofenac sodium 1 % Gel Commonly known as:  VOLTAREN APPLY TOPICALLY 4 TIMES DAILY. What changed:  See the new instructions.   ezetimibe 10 MG tablet Commonly known as:  ZETIA Take 1 tablet (10 mg total) by mouth daily.   hydrochlorothiazide 25 MG tablet Commonly known as:  HYDRODIURIL TAKE 1 TABLET (25 MG TOTAL) BY MOUTH DAILY.   lidocaine 5 % Commonly known as:  LIDODERM Place 1 patch onto the skin daily. Remove & Discard patch within 12 hours or as directed by MD What changed:  when to take this  reasons to take this  additional instructions   losartan 50 MG tablet Commonly known as:  COZAAR Take 1 tablet (50 mg total) by mouth daily.   methocarbamol 500 MG tablet Commonly known as:  ROBAXIN Take 1 tablet (500 mg total) by mouth 2 (two) times daily as needed for muscle spasms.   multivitamin with minerals Tabs tablet Take 1 tablet by mouth daily.   omeprazole 40 MG capsule Commonly known as:  PRILOSEC TAKE 1 CAPSULE BY MOUTH EVERY DAY   traMADol 50 MG tablet Commonly known as:  ULTRAM TAKE 2 TABLETS BY MOUTH EVERY 6 HOURS AS  NEEDED What changed:  See the new instructions.            Discharge Care Instructions        Start     Ordered   08/27/17 0000  Increase activity slowly     08/27/17 1144   08/27/17 0000  Discharge instructions    Comments:  Resume previous diet.   08/27/17 1144      Signed: Alonza Bogus D. 08/27/2017, 1:39 PM
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
## 2017-08-27 NOTE — Progress Notes (Signed)
Patient still having brown liquid stool. Dr Scarlette Shorts paged. Donne Hazel, RN

## 2017-08-28 ENCOUNTER — Encounter (HOSPITAL_COMMUNITY): Payer: Self-pay | Admitting: Internal Medicine

## 2017-08-29 ENCOUNTER — Encounter: Payer: Self-pay | Admitting: Internal Medicine

## 2017-08-29 ENCOUNTER — Encounter: Payer: Medicare Other | Admitting: Physical Medicine & Rehabilitation

## 2017-08-30 ENCOUNTER — Encounter: Payer: Self-pay | Admitting: Gastroenterology

## 2017-09-03 ENCOUNTER — Encounter: Payer: Medicare Other | Admitting: Physical Medicine & Rehabilitation

## 2017-09-10 ENCOUNTER — Encounter: Payer: Self-pay | Admitting: Internal Medicine

## 2017-09-10 ENCOUNTER — Ambulatory Visit: Payer: Medicare Other | Admitting: Pharmacist

## 2017-09-10 ENCOUNTER — Ambulatory Visit (INDEPENDENT_AMBULATORY_CARE_PROVIDER_SITE_OTHER): Payer: Medicare Other | Admitting: Internal Medicine

## 2017-09-10 DIAGNOSIS — Z720 Tobacco use: Secondary | ICD-10-CM

## 2017-09-10 DIAGNOSIS — I10 Essential (primary) hypertension: Secondary | ICD-10-CM | POA: Diagnosis not present

## 2017-09-10 DIAGNOSIS — Z23 Encounter for immunization: Secondary | ICD-10-CM | POA: Diagnosis not present

## 2017-09-10 DIAGNOSIS — Z1211 Encounter for screening for malignant neoplasm of colon: Secondary | ICD-10-CM

## 2017-09-10 DIAGNOSIS — Z0000 Encounter for general adult medical examination without abnormal findings: Secondary | ICD-10-CM

## 2017-09-10 MED ORDER — OMEPRAZOLE 40 MG PO CPDR: 40.0000 mg | DELAYED_RELEASE_CAPSULE | Freq: Every day | ORAL | 2 refills | Status: DC

## 2017-09-10 MED ORDER — NICOTINE 10 MG IN INHA: 1.0000 | RESPIRATORY_TRACT | 1 refills | Status: DC | PRN

## 2017-09-10 MED ORDER — AMLODIPINE BESYLATE 10 MG PO TABS: 10.0000 mg | ORAL_TABLET | Freq: Every day | ORAL | 2 refills | Status: DC

## 2017-09-10 NOTE — Patient Instructions (Signed)
Shawn [name],   [name] are doing great. I would like to see you back in March.

## 2017-09-10 NOTE — Assessment & Plan Note (Addendum)
Colonoscopy 08/27/17.  Found two 5 and 10 mm polyps in the ascending colon and in the cecum, respectively, removed with a cold snare. Resected and retrieved. Positive for external hemorrhoids. Otherwise normal examination. Pathology showed tubular adenoma in both polyps without high grade dysplasia or malignancy. Recommended follow up in 3 years.

## 2017-09-10 NOTE — Assessment & Plan Note (Addendum)
Still smoking 1/2 PPD. Reports he is working on quitting on his own. Not interesting in any medications to help quit. Discussed OTC options such as nicotine patches, gum, etc. Dr. Maudie Mercury showed him samples of various options. He was interested in trying the nicotine patch.   A/P Nicotine patch. Continue to encourage cessation.

## 2017-09-10 NOTE — Assessment & Plan Note (Addendum)
BP Readings from Last 3 Encounters:  09/10/17 115/79  08/27/17 (!) 143/84  06/20/17 (!) 151/91    Lab Results  Component Value Date   NA 129 (L) 10/23/2016   K 4.4 10/23/2016   CREATININE 0.54 (L) 10/23/2016   BP 115/79 today. Patient currently on HCTZ 25 mg daily, Losartan 50 mg daily, and amlodipine 10 mg daily. Changed from lisinopril 40 > losartan 02/26/17 visit 2/2 dry cough x 1 month, bp was stable on re-check a month later. Reports no further complaints of cough. No chest pain or shortness of breath.   A/P: Continue current medications

## 2017-09-10 NOTE — Progress Notes (Signed)
S: Shawn Trull. is a 66 y.o. male who is interested in smoking cessation.  Allergies  Allergen Reactions  . Penicillins Hives    Has patient had a PCN reaction causing immediate rash, facial/tongue/throat swelling, SOB or lightheadedness with hypotension: Yes Has patient had a PCN reaction causing severe rash involving mucus membranes or skin necrosis: Unknown Has patient had a PCN reaction that required hospitalization: No Has patient had a PCN reaction occurring within the last 10 years: Yes If all of the above answers are "NO", then may proceed with Cephalosporin use.    Medication Sig  amLODipine (NORVASC) 10 MG tablet Take 1 tablet (10 mg total) by mouth daily.  diclofenac sodium (VOLTAREN) 1 % GEL APPLY TOPICALLY 4 TIMES DAILY. Patient taking differently: APPLY TOPICALLY 4 TIMES DAILY AS NEEDED FOR PAIN  ezetimibe (ZETIA) 10 MG tablet Take 1 tablet (10 mg total) by mouth daily.  hydrochlorothiazide (HYDRODIURIL) 25 MG tablet TAKE 1 TABLET (25 MG TOTAL) BY MOUTH DAILY.  lidocaine (LIDODERM) 5 % Place 1 patch onto the skin daily. Remove & Discard patch within 12 hours or as directed by MD Patient taking differently: Place 1 patch onto the skin daily as needed (pain). Remove & Discard patch within 12 hours or as directed by MD  losartan (COZAAR) 50 MG tablet Take 1 tablet (50 mg total) by mouth daily.  methocarbamol (ROBAXIN) 500 MG tablet Take 1 tablet (500 mg total) by mouth 2 (two) times daily as needed for muscle spasms.  Multiple Vitamin (MULTIVITAMIN WITH MINERALS) TABS Take 1 tablet by mouth daily.  omeprazole (PRILOSEC) 40 MG capsule Take 1 capsule (40 mg total) by mouth daily.  traMADol (ULTRAM) 50 MG tablet TAKE 2 TABLETS BY MOUTH EVERY 6 HOURS AS NEEDED Patient taking differently: TAKE 2 TABLETS BY MOUTH EVERY 6 HOURS AS NEEDED for pain   Past Medical History:  Diagnosis Date  . Allergy   . Arthritis   . C. difficile colitis 11/2008  . Decubitus ulcer 1999   excision of right ischial pressure sore with flap reconstruction done by Dr. Denese Killings 10/31/2008  . GERD (gastroesophageal reflux disease)   . Hyperlipidemia   . Hypertension   . Paraplegia ([place])    secondary to [place]  . Perineal abscess   . Substance abuse ([place])    alcohol   Social History   Social History  . Marital status: Married    Spouse name: N/A  . Number of children: 1  . Years of education: N/A   Occupational History  . DISABILITY Unemployed   Social History Main Topics  . Smoking status: Current Every Day Smoker    Packs/day: 0.50    Years: 40.00    Types: Cigarettes  . Smokeless tobacco: Never Used     Comment: 1 PACK 2 DAYS  . Alcohol use No  . Drug use: No  . Sexual activity: Not on file   Other Topics Concern  . Not on file   Social History Narrative   Married, disabled, medicaid, functions independently at home, paraplegic      Family hx of : hypertension, diabetes, kidney disease, and other cancer   Family History  Problem Relation Age of Onset  . Stroke Mother   . Coronary artery disease Father   . Coronary artery disease Paternal Uncle   . Coronary artery disease Paternal Aunt    O: Component Value Date/Time   CHOL 122 10/23/2016 1404   HDL 27 (L) 10/23/2016 1404  TRIG 175 (H) 10/23/2016 1404   AST 14 09/06/2015 1622   ALT 21 09/06/2015 1622   NA 129 (L) 10/23/2016 1404   K 4.4 10/23/2016 1404   CL 89 (L) 10/23/2016 1404   CO2 21 10/23/2016 1404   GLUCOSE 98 10/23/2016 1404   GLUCOSE 95 03/22/2015 1559   HGBA1C  11/11/2007 0500    5.7 (NOTE)   The ADA recommends the following therapeutic goals for glycemic   control related to Hgb A1C measurement:   Goal of Therapy:   < 7.0% Hgb A1C   Action Suggested:  > 8.0% Hgb A1C   Ref:  Diabetes Care, 22, Suppl. 1, 1999   BUN 6 (L) 10/23/2016 1404   CREATININE 0.54 (L) 10/23/2016 1404   CREATININE 0.58 03/22/2015 1559   CALCIUM 9.1 10/23/2016 1404   GFRNONAA 109 10/23/2016 1404   GFRNONAA  >89 03/22/2015 1559   GFRAA 126 10/23/2016 1404   GFRAA >89 03/22/2015 1559   WBC 8.2 08/26/2017 0536   HGB 15.4 08/26/2017 0536   HCT 42.8 08/26/2017 0536   PLT 273 08/26/2017 0536   TSH 0.758 10/23/2016 1404   Ht Readings from Last 2 Encounters:  09/10/17 6' 1.5" (1.867 m)  08/27/17 6\' 1"  (1.854 m)   Wt Readings from Last 2 Encounters:  08/27/17 180 lb (81.6 kg)  07/31/17 185 lb (83.9 kg)   There is no height or weight on file to calculate BMI. BP Readings from Last 3 Encounters:  09/10/17 115/79  08/27/17 (!) 143/84  06/20/17 (!) 151/91   A/P:  Patient smokes 1/2 pack per day. Reviewed various options with patient and PCP and agreed to initiate nicotine patches, 14 mg daily x 6 weeks then reduce to 7 mg daily. Samples provided today.  Patient is interested in adding gum or inhaler to NRT patches, with preference for inhaler if covered by insurance. Will try to determine which agent is most feasible for cravings and will follow up with patient.  Patient was also referred to quitline and [place] free smoking cessation classes.  Provided patient my information and advised him to contact me if any questions.  Plan for follow up in 1-2 weeks. Patient verbalized understanding of information discussed by repeat back.

## 2017-09-10 NOTE — Assessment & Plan Note (Addendum)
Flu shot given today. Colonoscopy done 9/19, see separate problem. Will need repeat in 3 years, 08/2020.

## 2017-09-10 NOTE — Progress Notes (Signed)
   CC: HTN follow up  HPI:  [name] is a 66 y.o. male with a past medical history listed below here today for follow up of his HTN.   For details of today's visit and the status of his chronic medical issues please refer to the assessment and plan.   Past Medical History:  Diagnosis Date  . Allergy   . Arthritis   . C. difficile colitis 11/2008  . Decubitus ulcer 1999   excision of right ischial pressure sore with flap reconstruction done by Dr. Denese Killings 10/31/2008  . GERD (gastroesophageal reflux disease)   . Hyperlipidemia   . Hypertension   . Paraplegia ([place])    secondary to [place]  . Perineal abscess   . Substance abuse ([place])    alcohol   Review of Systems:   No chest pain or shortness of breath  Physical Exam:  Vitals:   09/10/17 1333  BP: 115/79  Pulse: 79  Temp: 98.2 F (36.8 C)  TempSrc: Oral  SpO2: 100%  Height: 6' 1.5" (1.867 m)   GENERAL- alert, co-operative, appears as stated age, not in any distress. CARDIAC- RRR, no murmurs, rubs or gallops. RESP- Moving equal volumes of air, and clear to auscultation bilaterally ABDOMEN- Soft, nontender, bowel sounds present. EXTREMITIES- pulse 2+, symmetric, no pedal edema. SKIN- Warm, dry, No rash or lesion. PSYCH- Normal mood and affect   Assessment & Plan:   See Encounters Tab for problem based charting.  Patient discussed with Dr. Eppie Gibson

## 2017-09-12 ENCOUNTER — Encounter: Payer: Self-pay | Admitting: Physical Medicine & Rehabilitation

## 2017-09-12 ENCOUNTER — Encounter: Payer: Medicare Other | Attending: Physical Medicine & Rehabilitation | Admitting: Physical Medicine & Rehabilitation

## 2017-09-12 VITALS — BP 116/78 | HR 75

## 2017-09-12 DIAGNOSIS — Z5181 Encounter for therapeutic drug level monitoring: Secondary | ICD-10-CM | POA: Diagnosis not present

## 2017-09-12 DIAGNOSIS — Z720 Tobacco use: Secondary | ICD-10-CM | POA: Diagnosis not present

## 2017-09-12 DIAGNOSIS — Z79899 Other long term (current) drug therapy: Secondary | ICD-10-CM | POA: Diagnosis not present

## 2017-09-12 DIAGNOSIS — G8929 Other chronic pain: Secondary | ICD-10-CM | POA: Insufficient documentation

## 2017-09-12 DIAGNOSIS — R269 Unspecified abnormalities of gait and mobility: Secondary | ICD-10-CM | POA: Diagnosis not present

## 2017-09-12 DIAGNOSIS — G8220 Paraplegia, unspecified: Secondary | ICD-10-CM | POA: Diagnosis not present

## 2017-09-12 DIAGNOSIS — M791 Myalgia, unspecified site: Secondary | ICD-10-CM | POA: Diagnosis not present

## 2017-09-12 DIAGNOSIS — G894 Chronic pain syndrome: Secondary | ICD-10-CM | POA: Diagnosis not present

## 2017-09-12 DIAGNOSIS — G479 Sleep disorder, unspecified: Secondary | ICD-10-CM | POA: Diagnosis not present

## 2017-09-12 DIAGNOSIS — M25561 Pain in right knee: Secondary | ICD-10-CM | POA: Insufficient documentation

## 2017-09-12 MED ORDER — DICLOFENAC SODIUM 1 % TD GEL: TRANSDERMAL | 1 refills | Status: DC

## 2017-09-12 MED ORDER — METHOCARBAMOL 500 MG PO TABS: 500.0000 mg | ORAL_TABLET | Freq: Two times a day (BID) | ORAL | 1 refills | Status: DC | PRN

## 2017-09-12 MED ORDER — TRAMADOL HCL 50 MG PO TABS: 100.0000 mg | ORAL_TABLET | Freq: Three times a day (TID) | ORAL | 1 refills | Status: DC | PRN

## 2017-09-12 MED ORDER — LIDOCAINE 5 % EX PTCH: 1.0000 | MEDICATED_PATCH | CUTANEOUS | 1 refills | Status: DC

## 2017-09-12 NOTE — Progress Notes (Addendum)
Subjective:    Patient ID: Shawn [name]., male    DOB: 16-Feb-1951, 66 y.o.   MRN: [id number]  HPI 66 y/o male with pmh/psh of paraplegia, Etoh abuse, HTN, L-spine surgery, flap present for follow up of pain >> right knee.  Initially stated: Presented since ~1995.  Exacerbated since fracture in 2007. Stable since then.  Narcotics improve the pain.  Cold exacerbate the pain.  Achy.  Non-radiating.  He has sensation to his knees.  Intermittent.  Ice/heat do not help.  Denies falls. Pain makes things uncomfortable.  Last clinic visit 06/20/17. Since last visit, continues to take meds.  He did obtain a blanket, but has not used it yet at night for his knees. He sleeps during the day and night.   Pain Inventory Average Pain 6 Pain Right Now 6 My pain is sharp and aching  In the last 24 hours, has pain interfered with the following? General activity 8 Relation with others 8 Enjoyment of life 8 What TIME of day is your pain at its worst? morning Sleep (in general) Fair  Pain is worse with: sitting Pain improves with: nothing Relief from Meds: 5  Mobility use a wheelchair needs help with transfers  Function disabled: date disabled 10/1972 retired  Neuro/Psych No problems in this area  Prior Studies Any changes since last visit?  no  Physicians involved in your care Any changes since last visit?  no   Family History  Problem Relation Age of Onset  . Stroke Mother   . Coronary artery disease Father   . Coronary artery disease Paternal Uncle   . Coronary artery disease Paternal Aunt    Social History   Social History  . Marital status: Married    Spouse name: N/A  . Number of children: 1  . Years of education: N/A   Occupational History  . DISABILITY Unemployed   Social History Main Topics  . Smoking status: Current Every Day Smoker    Packs/day: 0.50    Years: 40.00    Types: Cigarettes  . Smokeless tobacco: Never Used     Comment: 1 PACK 2 DAYS  .  Alcohol use No  . Drug use: No  . Sexual activity: Not on file   Other Topics Concern  . Not on file   Social History Narrative   Married, disabled, medicaid, functions independently at home, paraplegic      Family hx of : hypertension, diabetes, kidney disease, and other cancer   Past Surgical History:  Procedure Laterality Date  . BONE BIOPSY  11/2007   negative for organisms  . COLONOSCOPY WITH PROPOFOL N/A 08/27/2017   Procedure: COLONOSCOPY WITH PROPOFOL;  Surgeon: Irene Shipper, MD;  Location: [facility] ENDOSCOPY;  Service: Endoscopy;  Laterality: N/A;  . sacral wound flap  2002   done by Dr. Stephanie Coup  . SPINE SURGERY    . THROAT SURGERY     Past Medical History:  Diagnosis Date  . Allergy   . Arthritis   . C. difficile colitis 11/2008  . Decubitus ulcer 1999   excision of right ischial pressure sore with flap reconstruction done by Dr. Denese Killings 10/31/2008  . GERD (gastroesophageal reflux disease)   . Hyperlipidemia   . Hypertension   . Paraplegia ([place])    secondary to [place]  . Perineal abscess   . Substance abuse ([facility])    alcohol   BP 116/78   Pulse 75   SpO2 92%   Opioid  Risk Score:   Fall Risk Score:  `1  Depression screen PHQ 2/9  Depression screen [facility] 2/9 09/12/2017 09/10/2017 03/14/2017 02/26/2017 02/05/2017 01/21/2017 01/08/2017  Decreased Interest 0 0 0 0 0 0 3  Down, Depressed, Hopeless 0 0 0 0 0 0 0  PHQ - 2 Score 0 0 0 0 0 0 3  Altered sleeping - - - - - - 3  Tired, decreased energy - - - - - - 3  Change in appetite - - - - - - 0  Feeling bad or failure about yourself  - - - - - - 0  Trouble concentrating - - - - - - 1  Moving slowly or fidgety/restless - - - - - - 0  Suicidal thoughts - - - - - - 0  PHQ-9 Score - - - - - - 10  Difficult doing work/chores - - - Somewhat difficult - Somewhat difficult Somewhat difficult  Some recent data might be hidden    Review of Systems  HENT: Negative.   Eyes: Negative.   Respiratory: Negative.   Cardiovascular:  Negative.   Gastrointestinal: Negative.   Endocrine: Negative.   Genitourinary: Positive for difficulty urinating.  Musculoskeletal: Positive for arthralgias and myalgias.  Skin: Negative.   Allergic/Immunologic: Negative.   Neurological: Positive for weakness and numbness.  Hematological: Negative.   Psychiatric/Behavioral: Negative.   All other systems reviewed and are negative.     Objective:   Physical Exam Gen: NAD. Vital signs reviewed HENT: Normocephalic, Atraumatic Eyes: EOMI. No discharge.  Cardio: RRR. No JVD. Pulm: B/l clear to auscultation.  Effort normal Abd: Soft, BS+ MSK:  Gait nonambulatory.   No TTP.   Neuro:   Sensation absent below b/l knee, WNL proximally  Strength  RLE HF 2/5, distally 0/5    LLE HF/KE 2/5, distally 0/5 Skin: Warm and Dry. Intact. Healed scars.     Assessment & Plan:  66 y/o male with pmh/psh of paraplegia, Etoh abuse, HTN, L-spine surgery, flap present for follow up for pain >> right knee.  1. Chronic mechanical low back pain with chronic pain syndrome  Xray 10/2006 reviewed, revealing significant fractures  Heat/Cold ineffective for patient  Xray of right knee reviewed, chronic changes  D/ced Oxycodone  Cont bracing PRN  Cont Voltaren gel  Cont Lidoderm patch  Cont TENS IT  Cont Cymbalta 60mg   Cont Robaxin 500 BID PRN  Cont Tramadol to 100mg  TID PRN  Pt does better with heat - patient has obtained blanket, has not used it yet  Zilretta with benefit, plan to reinjection ~11/5.    Trial neoprene sleeve  2. Gait abnormality  Cont wheelchair  3. Sleep disturbance  Getting rest at different time  4. Paraplegia  Being followed by PCP  Pt may follow if additional needs arise  5. Tobacco abuse  Educated on abstinence, in process

## 2017-09-16 NOTE — Progress Notes (Signed)
Case discussed with Dr. Charlynn Grimes soon after the resident saw the patient.  We reviewed the resident's history and exam and pertinent patient test results.  I agree with the assessment, diagnosis and plan of care documented in the resident's note.

## 2017-09-22 DIAGNOSIS — N312 Flaccid neuropathic bladder, not elsewhere classified: Secondary | ICD-10-CM | POA: Diagnosis not present

## 2017-09-29 ENCOUNTER — Other Ambulatory Visit: Payer: Self-pay

## 2017-09-29 MED ORDER — METHOCARBAMOL 750 MG PO TABS: 750.0000 mg | ORAL_TABLET | Freq: Three times a day (TID) | ORAL | 0 refills | Status: DC

## 2017-10-15 ENCOUNTER — Encounter: Payer: Self-pay | Admitting: Physical Medicine & Rehabilitation

## 2017-10-15 ENCOUNTER — Encounter: Payer: Medicare Other | Attending: Physical Medicine & Rehabilitation | Admitting: Physical Medicine & Rehabilitation

## 2017-10-15 VITALS — BP 129/85 | HR 78

## 2017-10-15 DIAGNOSIS — G8929 Other chronic pain: Secondary | ICD-10-CM | POA: Diagnosis not present

## 2017-10-15 DIAGNOSIS — M25561 Pain in right knee: Secondary | ICD-10-CM | POA: Insufficient documentation

## 2017-10-15 DIAGNOSIS — G894 Chronic pain syndrome: Secondary | ICD-10-CM | POA: Diagnosis not present

## 2017-10-15 DIAGNOSIS — Z5181 Encounter for therapeutic drug level monitoring: Secondary | ICD-10-CM | POA: Insufficient documentation

## 2017-10-15 DIAGNOSIS — Z79899 Other long term (current) drug therapy: Secondary | ICD-10-CM | POA: Insufficient documentation

## 2017-10-15 DIAGNOSIS — M1711 Unilateral primary osteoarthritis, right knee: Secondary | ICD-10-CM

## 2017-10-15 NOTE — Progress Notes (Signed)
Right Knee injection   Indication: Knee pain not relieved by medication management and other conservative care.  Informed consent was obtained after describing risks and benefits of the procedure with the patient, this includes bleeding, bruising, infection and medication side effects. The patient wishes to proceed and has given written consent. Patient was placed in a seated position. The medial knee was marked and prepped with betadine in the anteromedial area. Vapocoolant spray was applied. A 22-gauge 2 inch needle was inserted into the joint space. After negative draw back for blood, a solution of Zilretta was injected. A band aid was applied. The patient tolerated the procedure well. Post procedure instructions were given.

## 2017-10-15 NOTE — Progress Notes (Deleted)
Subjective:    Patient ID: Shawn [name]., male    DOB: 21-Nov-1951, 66 y.o.   MRN: [id number]  HPI 67 y/o male with pmh/psh of paraplegia, Etoh abuse, HTN, L-spine surgery, flap present for follow up of pain >> right knee.  Initially stated: Presented since ~1995.  Exacerbated since fracture in 2007. Stable since then.  Narcotics improve the pain.  Cold exacerbate the pain.  Achy.  Non-radiating.  He has sensation to his knees.  Intermittent.  Ice/heat do not help.  Denies falls. Pain makes things uncomfortable.  Last clinic visit 06/20/17. Since last visit, continues to take meds.  He did obtain a blanket, but has not used it yet at night for his knees. He sleeps during the day and night.   Pain Inventory Average Pain 8 Pain Right Now 6 My pain is sharp and aching  In the last 24 hours, has pain interfered with the following? General activity 7 Relation with others 5 Enjoyment of life 6 What TIME of day is your pain at its worst? evening Sleep (in general) Fair  Pain is worse with: sitting Pain improves with: nothing Relief from Meds: 4  Mobility ability to climb steps?  no do you drive?  no use a wheelchair needs help with transfers  Function disabled: date disabled 10/1972 retired  Neuro/Psych No problems in this area  Prior Studies Any changes since last visit?  no  Physicians involved in your care Any changes since last visit?  no   Family History  Problem Relation Age of Onset  . Stroke Mother   . Coronary artery disease Father   . Coronary artery disease Paternal Uncle   . Coronary artery disease Paternal Aunt    Social History   Socioeconomic History  . Marital status: Married    Spouse name: None  . Number of children: 1  . Years of education: None  . Highest education level: None  Social Needs  . Financial resource strain: None  . Food insecurity - worry: None  . Food insecurity - inability: None  . Transportation needs - medical: None    . Transportation needs - non-medical: None  Occupational History  . Occupation: DISABILITY    Employer: UNEMPLOYED  Tobacco Use  . Smoking status: Current Every Day Smoker    Packs/day: 0.50    Years: 40.00    Pack years: 20.00    Types: Cigarettes  . Smokeless tobacco: Never Used  . Tobacco comment: 1 PACK 2 DAYS  Substance and Sexual Activity  . Alcohol use: No    Alcohol/week: 0.0 oz  . Drug use: No  . Sexual activity: None  Other Topics Concern  . None  Social History Narrative   Married, disabled, medicaid, functions independently at home, paraplegic      Family hx of : hypertension, diabetes, kidney disease, and other cancer   Past Surgical History:  Procedure Laterality Date  . BONE BIOPSY  11/2007   negative for organisms  . sacral wound flap  2002   done by Dr. Stephanie Coup  . SPINE SURGERY    . THROAT SURGERY     Past Medical History:  Diagnosis Date  . Allergy   . Arthritis   . C. difficile colitis 11/2008  . Decubitus ulcer 1999   excision of right ischial pressure sore with flap reconstruction done by Dr. Denese Killings 10/31/2008  . GERD (gastroesophageal reflux disease)   . Hyperlipidemia   . Hypertension   . Paraplegia (  [place])    secondary to [place]  . Perineal abscess   . Substance abuse ([place])    alcohol   There were no vitals taken for this visit.  Opioid Risk Score:   Fall Risk Score:  `1  Depression screen PHQ 2/9  Depression screen [facility] 2/9 10/15/2017 09/12/2017 09/10/2017 03/14/2017 02/26/2017 02/05/2017 01/21/2017  Decreased Interest 0 0 0 0 0 0 0  Down, Depressed, Hopeless 0 0 0 0 0 0 0  PHQ - 2 Score 0 0 0 0 0 0 0  Altered sleeping - - - - - - -  Tired, decreased energy - - - - - - -  Change in appetite - - - - - - -  Feeling bad or failure about yourself  - - - - - - -  Trouble concentrating - - - - - - -  Moving slowly or fidgety/restless - - - - - - -  Suicidal thoughts - - - - - - -  PHQ-9 Score - - - - - - -  Difficult doing work/chores - -  - - Somewhat difficult - Somewhat difficult  Some recent data might be hidden    Review of Systems  HENT: Negative.   Eyes: Negative.   Respiratory: Negative.   Cardiovascular: Positive for leg swelling.  Gastrointestinal: Negative.   Endocrine: Negative.   Genitourinary: Positive for difficulty urinating.  Musculoskeletal: Positive for arthralgias and myalgias.  Skin: Negative.   Allergic/Immunologic: Negative.   Neurological: Positive for weakness and numbness.  Hematological: Negative.   Psychiatric/Behavioral: Negative.   All other systems reviewed and are negative.     Objective:   Physical Exam Gen: NAD. Vital signs reviewed HENT: Normocephalic, Atraumatic Eyes: EOMI. No discharge.  Cardio: RRR. No JVD. Pulm: B/l clear to auscultation.  Effort normal Abd: Soft, BS+ MSK:  Gait nonambulatory.   No TTP.   Neuro:   Sensation absent below b/l knee, WNL proximally  Strength  RLE HF 2/5, distally 0/5    LLE HF/KE 2/5, distally 0/5 Skin: Warm and Dry. Intact. Healed scars.     Assessment & Plan:  66 y/o male with pmh/psh of paraplegia, Etoh abuse, HTN, L-spine surgery, flap present for follow up for pain >> right knee.  1. Chronic mechanical low back pain with chronic pain syndrome  Xray 10/2006 reviewed, revealing significant fractures  Heat/Cold ineffective for patient  Xray of right knee reviewed, chronic changes  D/ced Oxycodone  Cont bracing PRN  Cont Voltaren gel  Cont Lidoderm patch  Cont TENS IT  Cont Cymbalta 60mg   Cont Robaxin 500 BID PRN  Cont Tramadol to 100mg  TID PRN  Pt does better with heat - patient has obtained blanket, has not used it yet  Zilretta with benefit, plan to reinjection ~11/5.    Trial neoprene sleeve  2. Gait abnormality  Cont wheelchair  3. Sleep disturbance  Getting rest at different time  4. Paraplegia  Being followed by PCP  Pt may follow if additional needs arise  5. Tobacco abuse  Educated on abstinence, in  process

## 2017-10-27 DIAGNOSIS — N312 Flaccid neuropathic bladder, not elsewhere classified: Secondary | ICD-10-CM | POA: Diagnosis not present

## 2017-10-31 ENCOUNTER — Other Ambulatory Visit: Payer: Self-pay | Admitting: Internal Medicine

## 2017-11-13 ENCOUNTER — Encounter: Payer: Self-pay | Admitting: Physical Medicine & Rehabilitation

## 2017-11-13 ENCOUNTER — Encounter: Payer: Medicare Other | Attending: Physical Medicine & Rehabilitation | Admitting: Physical Medicine & Rehabilitation

## 2017-11-13 ENCOUNTER — Other Ambulatory Visit: Payer: Self-pay

## 2017-11-13 VITALS — BP 143/97 | HR 84

## 2017-11-13 DIAGNOSIS — M25561 Pain in right knee: Secondary | ICD-10-CM

## 2017-11-13 DIAGNOSIS — G479 Sleep disorder, unspecified: Secondary | ICD-10-CM

## 2017-11-13 DIAGNOSIS — Z79899 Other long term (current) drug therapy: Secondary | ICD-10-CM | POA: Insufficient documentation

## 2017-11-13 DIAGNOSIS — I10 Essential (primary) hypertension: Secondary | ICD-10-CM | POA: Diagnosis not present

## 2017-11-13 DIAGNOSIS — G894 Chronic pain syndrome: Secondary | ICD-10-CM | POA: Diagnosis not present

## 2017-11-13 DIAGNOSIS — Z720 Tobacco use: Secondary | ICD-10-CM

## 2017-11-13 DIAGNOSIS — G8220 Paraplegia, unspecified: Secondary | ICD-10-CM | POA: Diagnosis not present

## 2017-11-13 DIAGNOSIS — M791 Myalgia, unspecified site: Secondary | ICD-10-CM | POA: Diagnosis not present

## 2017-11-13 DIAGNOSIS — Z5181 Encounter for therapeutic drug level monitoring: Secondary | ICD-10-CM | POA: Diagnosis not present

## 2017-11-13 DIAGNOSIS — R269 Unspecified abnormalities of gait and mobility: Secondary | ICD-10-CM | POA: Diagnosis not present

## 2017-11-13 DIAGNOSIS — G8929 Other chronic pain: Secondary | ICD-10-CM

## 2017-11-13 MED ORDER — DICLOFENAC SODIUM 1 % TD GEL: TRANSDERMAL | 1 refills | Status: DC

## 2017-11-13 MED ORDER — LIDOCAINE 5 % EX PTCH: 1.0000 | MEDICATED_PATCH | CUTANEOUS | 1 refills | Status: DC

## 2017-11-13 NOTE — Progress Notes (Signed)
Subjective:    Patient ID: Shawn [name]., male    DOB: [birth year], 66 y.o.   MRN: [id number]  HPI 66 y/o male with pmh/psh of paraplegia, Etoh abuse, HTN, L-spine surgery, flap present for follow up of pain >> right knee.  Initially stated: Presented since ~1995.  Exacerbated since fracture in 2007. Stable since then.  Narcotics improve the pain.  Cold exacerbate the pain.  Achy.  Non-radiating.  He has sensation to his knees.  Intermittent.  Ice/heat do not help.  Denies falls. Pain makes things uncomfortable.  Last clinic visit 10/15/17. At that time, pt had steroid injection to his knee.  He states he is having benefit from injection.  He continues with TENS and meds.  He tried blanket with benefit.  He has not tried compression sleeve yet.  Smoking about 6-7 cig/day.     Pain Inventory Average Pain 6 Pain Right Now 0 My pain is aching  In the last 24 hours, has pain interfered with the following? General activity 7 Relation with others 7 Enjoyment of life 7 What TIME of day is your pain at its worst? morning Sleep (in general) Fair  Pain is worse with: sitting Pain improves with: medication and TENS Relief from Meds: 0  Mobility ability to climb steps?  no do you drive?  no use a wheelchair needs help with transfers  Function disabled: date disabled 10/1972 retired  Neuro/Psych No problems in this area  Prior Studies Any changes since last visit?  no  Physicians involved in your care Any changes since last visit?  no   Family History  Problem Relation Age of Onset  . Stroke Mother   . Coronary artery disease Father   . Coronary artery disease Paternal Uncle   . Coronary artery disease Paternal Aunt    Social History   Socioeconomic History  . Marital status: Married    Spouse name: None  . Number of children: 1  . Years of education: None  . Highest education level: None  Social Needs  . Financial resource strain: None  . Food insecurity -  worry: None  . Food insecurity - inability: None  . Transportation needs - medical: None  . Transportation needs - non-medical: None  Occupational History  . Occupation: DISABILITY    Employer: UNEMPLOYED  Tobacco Use  . Smoking status: Current Every Day Smoker    Packs/day: 0.50    Years: 40.00    Pack years: 20.00    Types: Cigarettes  . Smokeless tobacco: Never Used  . Tobacco comment: 1 PACK 2 DAYS  Substance and Sexual Activity  . Alcohol use: No    Alcohol/week: 0.0 oz  . Drug use: No  . Sexual activity: None  Other Topics Concern  . None  Social History Narrative   Married, disabled, medicaid, functions independently at home, paraplegic      Family hx of : hypertension, diabetes, kidney disease, and other cancer   Past Surgical History:  Procedure Laterality Date  . BONE BIOPSY  11/2007   negative for organisms  . COLONOSCOPY WITH PROPOFOL N/A 08/27/2017   Procedure: COLONOSCOPY WITH PROPOFOL;  Surgeon: Irene Shipper, MD;  Location: [facility];  Service: Endoscopy;  Laterality: N/A;  . sacral wound flap  2002   done by Dr. Stephanie Coup  . SPINE SURGERY    . THROAT SURGERY     Past Medical History:  Diagnosis Date  . Allergy   . Arthritis   .  C. difficile colitis 11/2008  . Decubitus ulcer 1999   excision of right ischial pressure sore with flap reconstruction done by Dr. Denese Killings 10/31/2008  . GERD (gastroesophageal reflux disease)   . Hyperlipidemia   . Hypertension   . Paraplegia ([place])    secondary to [place]  . Perineal abscess   . Substance abuse ([facility])    alcohol   BP (!) 143/97   Pulse 84   SpO2 93%   Opioid Risk Score:   Fall Risk Score:  `1  Depression screen PHQ 2/9  Depression screen [facility] 2/9 11/13/2017 10/15/2017 09/12/2017 09/10/2017 03/14/2017 02/26/2017 02/05/2017  Decreased Interest 0 0 0 0 0 0 0  Down, Depressed, Hopeless 0 0 0 0 0 0 0  PHQ - 2 Score 0 0 0 0 0 0 0  Altered sleeping - - - - - - -  Tired, decreased energy - - - - - - -  Change  in appetite - - - - - - -  Feeling bad or failure about yourself  - - - - - - -  Trouble concentrating - - - - - - -  Moving slowly or fidgety/restless - - - - - - -  Suicidal thoughts - - - - - - -  PHQ-9 Score - - - - - - -  Difficult doing work/chores - - - - - Somewhat difficult -  Some recent data might be hidden    Review of Systems  HENT: Negative.   Eyes: Negative.   Respiratory: Negative.   Cardiovascular: Negative.   Gastrointestinal: Negative.   Endocrine: Negative.   Genitourinary: Positive for difficulty urinating.  Musculoskeletal: Positive for arthralgias and myalgias.  Skin: Negative.   Allergic/Immunologic: Negative.   Neurological: Positive for weakness and numbness.  Hematological: Negative.   Psychiatric/Behavioral: Negative.   All other systems reviewed and are negative.     Objective:   Physical Exam Gen: NAD. Vital signs reviewed HENT: Normocephalic, Atraumatic Eyes: EOMI. No discharge.  Cardio: RRR. No JVD. Pulm: B/l clear to auscultation.  Effort normal Abd: Soft, BS+ MSK:  Gait nonambulatory.   No TTP.   Neuro:   Sensation absent below b/l knee, WNL proximally  Strength  RLE HF 2/5, distally 0/5    LLE HF/KE 2/5, distally 0/5 Skin: Warm and Dry. Intact. Healed scars.     Assessment & Plan:  66 y/o male with pmh/psh of paraplegia, Etoh abuse, HTN, L-spine surgery, flap present for follow up for pain >> right knee.  1. Chronic mechanical low back pain with chronic pain syndrome  Xray 10/2006 reviewed, revealing significant fractures  Heat/Cold ineffective for patient  Xray of right knee reviewed, chronic changes  D/ced Oxycodone  Cont bracing PRN  Cont Voltaren gel  Cont Lidoderm patch  Cont TENS IT  Cont Cymbalta 60mg   Cont Robaxin 500 BID PRN  Cont Tramadol to 100mg  TID PRN  Pt does better with heat - patient has obtained blanket, with benefit  Zilretta with benefit, reinjected 11/7.    Trial neoprene sleeve, pt has yet to  obtain  2. Gait abnormality  Cont wheelchair  3. Sleep disturbance  Getting rest at different time  4. Paraplegia  Being followed by PCP  Pt may follow if additional needs arise  5. Tobacco abuse  Educated on abstinence again

## 2017-11-14 ENCOUNTER — Other Ambulatory Visit: Payer: Self-pay | Admitting: Physical Medicine & Rehabilitation

## 2017-11-14 ENCOUNTER — Ambulatory Visit: Payer: Medicare Other | Admitting: Physical Medicine & Rehabilitation

## 2017-11-19 ENCOUNTER — Other Ambulatory Visit: Payer: Self-pay

## 2017-11-19 MED ORDER — TRAMADOL HCL 50 MG PO TABS: ORAL_TABLET | ORAL | 1 refills | Status: DC

## 2017-11-19 NOTE — Telephone Encounter (Signed)
Patients wife called stating that he needs a refill on his tramadol and that he has been out of it for a week.

## 2017-11-19 NOTE — Telephone Encounter (Signed)
Medication was refilled and called into pharmacy yesterday, called patient and informed them.

## 2017-11-20 ENCOUNTER — Emergency Department (HOSPITAL_COMMUNITY)
Admission: EM | Admit: 2017-11-20 | Discharge: 2017-11-20 | Disposition: A | Discharge status: Home / Self Care | Payer: Medicare Other | Attending: Emergency Medicine | Admitting: Emergency Medicine

## 2017-11-20 ENCOUNTER — Encounter (HOSPITAL_COMMUNITY): Payer: Self-pay | Admitting: Emergency Medicine

## 2017-11-20 DIAGNOSIS — T24231A Burn of second degree of right lower leg, initial encounter: Secondary | ICD-10-CM

## 2017-11-20 DIAGNOSIS — Y999 Unspecified external cause status: Secondary | ICD-10-CM | POA: Insufficient documentation

## 2017-11-20 DIAGNOSIS — Y9384 Activity, sleeping: Secondary | ICD-10-CM | POA: Diagnosis not present

## 2017-11-20 DIAGNOSIS — X16XXXA Contact with hot heating appliances, radiators and pipes, initial encounter: Secondary | ICD-10-CM | POA: Diagnosis not present

## 2017-11-20 DIAGNOSIS — I10 Essential (primary) hypertension: Secondary | ICD-10-CM | POA: Insufficient documentation

## 2017-11-20 DIAGNOSIS — Y69 Unspecified misadventure during surgical and medical care: Secondary | ICD-10-CM | POA: Insufficient documentation

## 2017-11-20 DIAGNOSIS — G8220 Paraplegia, unspecified: Secondary | ICD-10-CM | POA: Diagnosis not present

## 2017-11-20 DIAGNOSIS — E876 Hypokalemia: Secondary | ICD-10-CM | POA: Insufficient documentation

## 2017-11-20 DIAGNOSIS — Z79899 Other long term (current) drug therapy: Secondary | ICD-10-CM | POA: Insufficient documentation

## 2017-11-20 DIAGNOSIS — Y92009 Unspecified place in unspecified non-institutional (private) residence as the place of occurrence of the external cause: Secondary | ICD-10-CM | POA: Insufficient documentation

## 2017-11-20 DIAGNOSIS — T887XXA Unspecified adverse effect of drug or medicament, initial encounter: Secondary | ICD-10-CM | POA: Insufficient documentation

## 2017-11-20 DIAGNOSIS — F17210 Nicotine dependence, cigarettes, uncomplicated: Secondary | ICD-10-CM | POA: Insufficient documentation

## 2017-11-20 DIAGNOSIS — T502X5A Adverse effect of carbonic-anhydrase inhibitors, benzothiadiazides and other diuretics, initial encounter: Secondary | ICD-10-CM | POA: Diagnosis not present

## 2017-11-20 DIAGNOSIS — T24032A Burn of unspecified degree of left lower leg, initial encounter: Secondary | ICD-10-CM | POA: Diagnosis present

## 2017-11-20 LAB — BASIC METABOLIC PANEL
ANION GAP: 11 (ref 5–15)
BUN: 8 mg/dL (ref 6–20)
CHLORIDE: 94 mmol/L — AB (ref 101–111)
CO2: 28 mmol/L (ref 22–32)
Calcium: 9.1 mg/dL (ref 8.9–10.3)
Creatinine, Ser: 0.71 mg/dL (ref 0.61–1.24)
GFR calc Af Amer: >60 mL/min (ref >60)
GFR calc non Af Amer: >60 mL/min (ref >60)
GLUCOSE: 128 mg/dL — AB (ref 65–99)
POTASSIUM: 3.1 mmol/L — AB (ref 3.5–5.1)
Sodium: 133 mmol/L — ABNORMAL LOW (ref 135–145)

## 2017-11-20 LAB — CBC WITH DIFFERENTIAL/PLATELET
BASOS PCT: 0 %
Basophils Absolute: 0 10*3/uL (ref 0.0–0.1)
EOS ABS: 0.2 10*3/uL (ref 0.0–0.7)
EOS PCT: 2 %
HCT: 44.3 % (ref 39.0–52.0)
Hemoglobin: 15.7 g/dL (ref 13.0–17.0)
LYMPHS ABS: 1.9 10*3/uL (ref 0.7–4.0)
Lymphocytes Relative: 22 %
MCH: 31 pg (ref 26.0–34.0)
MCHC: 35.4 g/dL (ref 30.0–36.0)
MCV: 87.4 fL (ref 78.0–100.0)
MONO ABS: 0.8 10*3/uL (ref 0.1–1.0)
MONOS PCT: 9 %
NEUTROS PCT: 67 %
Neutro Abs: 5.9 10*3/uL (ref 1.7–7.7)
PLATELETS: 311 10*3/uL (ref 150–400)
RBC: 5.07 MIL/uL (ref 4.22–5.81)
RDW: 14.9 % (ref 11.5–15.5)
WBC: 8.8 10*3/uL (ref 4.0–10.5)

## 2017-11-20 MED ORDER — SILVER SULFADIAZINE 1 % EX CREA
TOPICAL_CREAM | Freq: Once | CUTANEOUS | Status: AC
  Administered 2017-11-20: 1 via TOPICAL
  Filled 2017-11-20: qty 85

## 2017-11-20 MED ORDER — SILVER SULFADIAZINE 1 % EX CREA: 1.0000 "application " | TOPICAL_CREAM | Freq: Every day | CUTANEOUS | 1 refills | Status: DC

## 2017-11-20 MED ORDER — POTASSIUM CHLORIDE CRYS ER 20 MEQ PO TBCR: 20.0000 meq | EXTENDED_RELEASE_TABLET | Freq: Two times a day (BID) | ORAL | 0 refills | Status: DC

## 2017-11-20 MED ORDER — POTASSIUM CHLORIDE CRYS ER 20 MEQ PO TBCR
40.0000 meq | EXTENDED_RELEASE_TABLET | Freq: Once | ORAL | Status: AC
  Administered 2017-11-20: 40 meq via ORAL
  Filled 2017-11-20: qty 2
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
## 2017-11-25 DIAGNOSIS — G629 Polyneuropathy, unspecified: Secondary | ICD-10-CM | POA: Diagnosis not present

## 2017-11-25 DIAGNOSIS — T24331A Burn of third degree of right lower leg, initial encounter: Secondary | ICD-10-CM | POA: Diagnosis not present

## 2017-11-26 DIAGNOSIS — N312 Flaccid neuropathic bladder, not elsewhere classified: Secondary | ICD-10-CM | POA: Diagnosis not present

## 2017-12-08 DIAGNOSIS — I10 Essential (primary) hypertension: Secondary | ICD-10-CM | POA: Diagnosis not present

## 2017-12-08 DIAGNOSIS — F17210 Nicotine dependence, cigarettes, uncomplicated: Secondary | ICD-10-CM | POA: Diagnosis not present

## 2017-12-08 DIAGNOSIS — Z9689 Presence of other specified functional implants: Secondary | ICD-10-CM | POA: Diagnosis not present

## 2017-12-08 DIAGNOSIS — G8220 Paraplegia, unspecified: Secondary | ICD-10-CM | POA: Diagnosis not present

## 2017-12-08 DIAGNOSIS — Z720 Tobacco use: Secondary | ICD-10-CM | POA: Diagnosis not present

## 2017-12-08 DIAGNOSIS — T310 Burns involving less than 10% of body surface: Secondary | ICD-10-CM | POA: Diagnosis not present

## 2017-12-08 DIAGNOSIS — T24302A Burn of third degree of unspecified site of left lower limb, except ankle and foot, initial encounter: Secondary | ICD-10-CM | POA: Diagnosis not present

## 2017-12-08 DIAGNOSIS — X150XXA Contact with hot stove (kitchen), initial encounter: Secondary | ICD-10-CM | POA: Diagnosis not present

## 2017-12-12 DIAGNOSIS — M87861 Other osteonecrosis, right tibia: Secondary | ICD-10-CM | POA: Diagnosis not present

## 2017-12-12 DIAGNOSIS — E871 Hypo-osmolality and hyponatremia: Secondary | ICD-10-CM | POA: Diagnosis not present

## 2017-12-12 DIAGNOSIS — K219 Gastro-esophageal reflux disease without esophagitis: Secondary | ICD-10-CM | POA: Diagnosis not present

## 2017-12-12 DIAGNOSIS — E7800 Pure hypercholesterolemia, unspecified: Secondary | ICD-10-CM | POA: Diagnosis not present

## 2017-12-12 DIAGNOSIS — G478 Other sleep disorders: Secondary | ICD-10-CM | POA: Diagnosis not present

## 2017-12-12 DIAGNOSIS — R6510 Systemic inflammatory response syndrome (SIRS) of non-infectious origin without acute organ dysfunction: Secondary | ICD-10-CM | POA: Diagnosis not present

## 2017-12-12 DIAGNOSIS — M87874 Other osteonecrosis, right foot: Secondary | ICD-10-CM | POA: Diagnosis not present

## 2017-12-12 DIAGNOSIS — T24032A Burn of unspecified degree of left lower leg, initial encounter: Secondary | ICD-10-CM | POA: Diagnosis not present

## 2017-12-12 DIAGNOSIS — R0683 Snoring: Secondary | ICD-10-CM | POA: Diagnosis not present

## 2017-12-12 DIAGNOSIS — T24301A Burn of third degree of unspecified site of right lower limb, except ankle and foot, initial encounter: Secondary | ICD-10-CM | POA: Diagnosis not present

## 2017-12-12 DIAGNOSIS — E785 Hyperlipidemia, unspecified: Secondary | ICD-10-CM | POA: Diagnosis not present

## 2017-12-12 DIAGNOSIS — J309 Allergic rhinitis, unspecified: Secondary | ICD-10-CM | POA: Diagnosis not present

## 2017-12-12 DIAGNOSIS — T25391A Burn of third degree of multiple sites of right ankle and foot, initial encounter: Secondary | ICD-10-CM | POA: Diagnosis not present

## 2017-12-12 DIAGNOSIS — D638 Anemia in other chronic diseases classified elsewhere: Secondary | ICD-10-CM | POA: Diagnosis not present

## 2017-12-12 DIAGNOSIS — G4719 Other hypersomnia: Secondary | ICD-10-CM | POA: Diagnosis not present

## 2017-12-12 DIAGNOSIS — Z9119 Patient's noncompliance with other medical treatment and regimen: Secondary | ICD-10-CM | POA: Diagnosis not present

## 2017-12-12 DIAGNOSIS — Z01818 Encounter for other preprocedural examination: Secondary | ICD-10-CM | POA: Diagnosis not present

## 2017-12-12 DIAGNOSIS — G8220 Paraplegia, unspecified: Secondary | ICD-10-CM | POA: Diagnosis not present

## 2017-12-12 DIAGNOSIS — Z880 Allergy status to penicillin: Secondary | ICD-10-CM | POA: Diagnosis not present

## 2017-12-12 DIAGNOSIS — Z79899 Other long term (current) drug therapy: Secondary | ICD-10-CM | POA: Diagnosis not present

## 2017-12-12 DIAGNOSIS — R739 Hyperglycemia, unspecified: Secondary | ICD-10-CM | POA: Diagnosis not present

## 2017-12-12 DIAGNOSIS — I10 Essential (primary) hypertension: Secondary | ICD-10-CM | POA: Diagnosis not present

## 2017-12-12 DIAGNOSIS — G8929 Other chronic pain: Secondary | ICD-10-CM | POA: Diagnosis not present

## 2017-12-12 DIAGNOSIS — T310 Burns involving less than 10% of body surface: Secondary | ICD-10-CM | POA: Diagnosis not present

## 2017-12-12 DIAGNOSIS — G4720 Circadian rhythm sleep disorder, unspecified type: Secondary | ICD-10-CM | POA: Diagnosis not present

## 2017-12-12 DIAGNOSIS — F17210 Nicotine dependence, cigarettes, uncomplicated: Secondary | ICD-10-CM | POA: Diagnosis not present

## 2017-12-12 DIAGNOSIS — Z8249 Family history of ischemic heart disease and other diseases of the circulatory system: Secondary | ICD-10-CM | POA: Diagnosis not present

## 2017-12-13 ENCOUNTER — Other Ambulatory Visit: Payer: Self-pay | Admitting: Internal Medicine

## 2017-12-17 DIAGNOSIS — T25321A Burn of third degree of right foot, initial encounter: Secondary | ICD-10-CM | POA: Diagnosis not present

## 2017-12-17 DIAGNOSIS — E871 Hypo-osmolality and hyponatremia: Secondary | ICD-10-CM | POA: Diagnosis present

## 2017-12-17 DIAGNOSIS — T24302A Burn of third degree of unspecified site of left lower limb, except ankle and foot, initial encounter: Secondary | ICD-10-CM | POA: Diagnosis not present

## 2017-12-17 DIAGNOSIS — E878 Other disorders of electrolyte and fluid balance, not elsewhere classified: Secondary | ICD-10-CM | POA: Diagnosis not present

## 2017-12-17 DIAGNOSIS — M87874 Other osteonecrosis, right foot: Secondary | ICD-10-CM | POA: Diagnosis present

## 2017-12-17 DIAGNOSIS — M87861 Other osteonecrosis, right tibia: Secondary | ICD-10-CM | POA: Diagnosis present

## 2017-12-17 DIAGNOSIS — Z880 Allergy status to penicillin: Secondary | ICD-10-CM | POA: Diagnosis not present

## 2017-12-17 DIAGNOSIS — Z8249 Family history of ischemic heart disease and other diseases of the circulatory system: Secondary | ICD-10-CM | POA: Diagnosis not present

## 2017-12-17 DIAGNOSIS — R6510 Systemic inflammatory response syndrome (SIRS) of non-infectious origin without acute organ dysfunction: Secondary | ICD-10-CM | POA: Diagnosis present

## 2017-12-17 DIAGNOSIS — Z720 Tobacco use: Secondary | ICD-10-CM | POA: Diagnosis not present

## 2017-12-17 DIAGNOSIS — I491 Atrial premature depolarization: Secondary | ICD-10-CM | POA: Diagnosis not present

## 2017-12-17 DIAGNOSIS — Z9119 Patient's noncompliance with other medical treatment and regimen: Secondary | ICD-10-CM | POA: Diagnosis not present

## 2017-12-17 DIAGNOSIS — E785 Hyperlipidemia, unspecified: Secondary | ICD-10-CM | POA: Diagnosis present

## 2017-12-17 DIAGNOSIS — Z79899 Other long term (current) drug therapy: Secondary | ICD-10-CM | POA: Diagnosis not present

## 2017-12-17 DIAGNOSIS — I10 Essential (primary) hypertension: Secondary | ICD-10-CM | POA: Diagnosis present

## 2017-12-17 DIAGNOSIS — Z89611 Acquired absence of right leg above knee: Secondary | ICD-10-CM | POA: Diagnosis not present

## 2017-12-17 DIAGNOSIS — G8220 Paraplegia, unspecified: Secondary | ICD-10-CM | POA: Diagnosis present

## 2017-12-17 DIAGNOSIS — R000 Tachycardia, unspecified: Secondary | ICD-10-CM | POA: Diagnosis not present

## 2017-12-17 DIAGNOSIS — I739 Peripheral vascular disease, unspecified: Secondary | ICD-10-CM | POA: Diagnosis not present

## 2017-12-17 DIAGNOSIS — K5900 Constipation, unspecified: Secondary | ICD-10-CM | POA: Diagnosis not present

## 2017-12-17 DIAGNOSIS — I493 Ventricular premature depolarization: Secondary | ICD-10-CM | POA: Diagnosis not present

## 2017-12-17 DIAGNOSIS — X150XXA Contact with hot stove (kitchen), initial encounter: Secondary | ICD-10-CM | POA: Diagnosis not present

## 2017-12-17 DIAGNOSIS — K219 Gastro-esophageal reflux disease without esophagitis: Secondary | ICD-10-CM | POA: Diagnosis present

## 2017-12-17 DIAGNOSIS — G8918 Other acute postprocedural pain: Secondary | ICD-10-CM | POA: Diagnosis not present

## 2017-12-17 DIAGNOSIS — T24391A Burn of third degree of multiple sites of right lower limb, except ankle and foot, initial encounter: Secondary | ICD-10-CM | POA: Diagnosis not present

## 2017-12-17 DIAGNOSIS — D649 Anemia, unspecified: Secondary | ICD-10-CM | POA: Diagnosis not present

## 2017-12-17 DIAGNOSIS — T310 Burns involving less than 10% of body surface: Secondary | ICD-10-CM | POA: Diagnosis present

## 2017-12-17 DIAGNOSIS — D72829 Elevated white blood cell count, unspecified: Secondary | ICD-10-CM | POA: Diagnosis not present

## 2017-12-17 DIAGNOSIS — F17210 Nicotine dependence, cigarettes, uncomplicated: Secondary | ICD-10-CM | POA: Diagnosis present

## 2017-12-17 DIAGNOSIS — T25321D Burn of third degree of right foot, subsequent encounter: Secondary | ICD-10-CM | POA: Diagnosis not present

## 2017-12-17 DIAGNOSIS — D638 Anemia in other chronic diseases classified elsewhere: Secondary | ICD-10-CM | POA: Diagnosis present

## 2017-12-17 DIAGNOSIS — G546 Phantom limb syndrome with pain: Secondary | ICD-10-CM | POA: Diagnosis not present

## 2017-12-17 DIAGNOSIS — Z7409 Other reduced mobility: Secondary | ICD-10-CM | POA: Diagnosis not present

## 2017-12-17 DIAGNOSIS — Z89511 Acquired absence of right leg below knee: Secondary | ICD-10-CM | POA: Diagnosis not present

## 2017-12-17 DIAGNOSIS — T25391A Burn of third degree of multiple sites of right ankle and foot, initial encounter: Secondary | ICD-10-CM | POA: Diagnosis present

## 2017-12-17 DIAGNOSIS — R739 Hyperglycemia, unspecified: Secondary | ICD-10-CM | POA: Diagnosis present

## 2017-12-17 DIAGNOSIS — E7800 Pure hypercholesterolemia, unspecified: Secondary | ICD-10-CM | POA: Diagnosis present

## 2017-12-17 DIAGNOSIS — T24301A Burn of third degree of unspecified site of right lower limb, except ankle and foot, initial encounter: Secondary | ICD-10-CM | POA: Diagnosis present

## 2018-01-07 ENCOUNTER — Other Ambulatory Visit: Payer: Self-pay | Admitting: *Deleted

## 2018-01-07 NOTE — Telephone Encounter (Signed)
Next appt scheduled  02/11/18 with PCP.

## 2018-01-08 MED ORDER — LOSARTAN POTASSIUM 50 MG PO TABS: 50.0000 mg | ORAL_TABLET | Freq: Every day | ORAL | 2 refills | Status: DC

## 2018-01-14 ENCOUNTER — Encounter: Payer: Medicare Other | Admitting: Physical Medicine & Rehabilitation

## 2018-01-24 DIAGNOSIS — F17210 Nicotine dependence, cigarettes, uncomplicated: Secondary | ICD-10-CM | POA: Diagnosis not present

## 2018-01-24 DIAGNOSIS — Z4781 Encounter for orthopedic aftercare following surgical amputation: Secondary | ICD-10-CM | POA: Diagnosis not present

## 2018-01-24 DIAGNOSIS — I10 Essential (primary) hypertension: Secondary | ICD-10-CM | POA: Diagnosis not present

## 2018-01-24 DIAGNOSIS — S24102S Unspecified injury at T2-T6 level of thoracic spinal cord, sequela: Secondary | ICD-10-CM | POA: Diagnosis not present

## 2018-01-24 DIAGNOSIS — Z936 Other artificial openings of urinary tract status: Secondary | ICD-10-CM | POA: Diagnosis not present

## 2018-01-24 DIAGNOSIS — X16XXXD Contact with hot heating appliances, radiators and pipes, subsequent encounter: Secondary | ICD-10-CM | POA: Diagnosis not present

## 2018-01-24 DIAGNOSIS — Z993 Dependence on wheelchair: Secondary | ICD-10-CM | POA: Diagnosis not present

## 2018-01-24 DIAGNOSIS — G8220 Paraplegia, unspecified: Secondary | ICD-10-CM | POA: Diagnosis not present

## 2018-01-24 DIAGNOSIS — Z89611 Acquired absence of right leg above knee: Secondary | ICD-10-CM | POA: Diagnosis not present

## 2018-01-24 DIAGNOSIS — K219 Gastro-esophageal reflux disease without esophagitis: Secondary | ICD-10-CM | POA: Diagnosis not present

## 2018-01-27 ENCOUNTER — Telehealth: Payer: Self-pay | Admitting: *Deleted

## 2018-01-27 DIAGNOSIS — Z89611 Acquired absence of right leg above knee: Secondary | ICD-10-CM | POA: Diagnosis not present

## 2018-01-27 DIAGNOSIS — I10 Essential (primary) hypertension: Secondary | ICD-10-CM | POA: Diagnosis not present

## 2018-01-27 DIAGNOSIS — S24102S Unspecified injury at T2-T6 level of thoracic spinal cord, sequela: Secondary | ICD-10-CM | POA: Diagnosis not present

## 2018-01-27 DIAGNOSIS — G8220 Paraplegia, unspecified: Secondary | ICD-10-CM | POA: Diagnosis not present

## 2018-01-27 DIAGNOSIS — Z4781 Encounter for orthopedic aftercare following surgical amputation: Secondary | ICD-10-CM | POA: Diagnosis not present

## 2018-01-27 NOTE — Telephone Encounter (Signed)
[facility] rec PT 1x/wk x1 wk then 2x/wk x 4wks for mobility & transfers. VO given, do you agree? Thank you!

## 2018-01-30 DIAGNOSIS — S24102S Unspecified injury at T2-T6 level of thoracic spinal cord, sequela: Secondary | ICD-10-CM | POA: Diagnosis not present

## 2018-01-30 DIAGNOSIS — G8220 Paraplegia, unspecified: Secondary | ICD-10-CM | POA: Diagnosis not present

## 2018-01-30 DIAGNOSIS — Z89611 Acquired absence of right leg above knee: Secondary | ICD-10-CM | POA: Diagnosis not present

## 2018-01-30 DIAGNOSIS — Z4781 Encounter for orthopedic aftercare following surgical amputation: Secondary | ICD-10-CM | POA: Diagnosis not present

## 2018-01-30 DIAGNOSIS — I10 Essential (primary) hypertension: Secondary | ICD-10-CM | POA: Diagnosis not present

## 2018-02-02 DIAGNOSIS — Z4781 Encounter for orthopedic aftercare following surgical amputation: Secondary | ICD-10-CM | POA: Diagnosis not present

## 2018-02-02 DIAGNOSIS — Z89611 Acquired absence of right leg above knee: Secondary | ICD-10-CM | POA: Diagnosis not present

## 2018-02-02 DIAGNOSIS — S24102S Unspecified injury at T2-T6 level of thoracic spinal cord, sequela: Secondary | ICD-10-CM | POA: Diagnosis not present

## 2018-02-02 DIAGNOSIS — Z89511 Acquired absence of right leg below knee: Secondary | ICD-10-CM | POA: Diagnosis not present

## 2018-02-02 DIAGNOSIS — G8220 Paraplegia, unspecified: Secondary | ICD-10-CM | POA: Diagnosis not present

## 2018-02-02 DIAGNOSIS — I10 Essential (primary) hypertension: Secondary | ICD-10-CM | POA: Diagnosis not present

## 2018-02-05 DIAGNOSIS — Z89611 Acquired absence of right leg above knee: Secondary | ICD-10-CM | POA: Diagnosis not present

## 2018-02-05 DIAGNOSIS — E46 Unspecified protein-calorie malnutrition: Secondary | ICD-10-CM | POA: Diagnosis not present

## 2018-02-05 DIAGNOSIS — T8753 Necrosis of amputation stump, right lower extremity: Secondary | ICD-10-CM | POA: Diagnosis not present

## 2018-02-05 DIAGNOSIS — T8131XS Disruption of external operation (surgical) wound, not elsewhere classified, sequela: Secondary | ICD-10-CM | POA: Diagnosis not present

## 2018-02-05 DIAGNOSIS — L97113 Non-pressure chronic ulcer of right thigh with necrosis of muscle: Secondary | ICD-10-CM | POA: Diagnosis not present

## 2018-02-05 DIAGNOSIS — Z4889 Encounter for other specified surgical aftercare: Secondary | ICD-10-CM | POA: Diagnosis not present

## 2018-02-05 DIAGNOSIS — I7090 Unspecified atherosclerosis: Secondary | ICD-10-CM | POA: Diagnosis not present

## 2018-02-06 DIAGNOSIS — Z89611 Acquired absence of right leg above knee: Secondary | ICD-10-CM | POA: Diagnosis not present

## 2018-02-06 DIAGNOSIS — S24102S Unspecified injury at T2-T6 level of thoracic spinal cord, sequela: Secondary | ICD-10-CM | POA: Diagnosis not present

## 2018-02-06 DIAGNOSIS — Z4781 Encounter for orthopedic aftercare following surgical amputation: Secondary | ICD-10-CM | POA: Diagnosis not present

## 2018-02-06 DIAGNOSIS — I10 Essential (primary) hypertension: Secondary | ICD-10-CM | POA: Diagnosis not present

## 2018-02-06 DIAGNOSIS — G8220 Paraplegia, unspecified: Secondary | ICD-10-CM | POA: Diagnosis not present

## 2018-02-09 DIAGNOSIS — I10 Essential (primary) hypertension: Secondary | ICD-10-CM | POA: Diagnosis not present

## 2018-02-09 DIAGNOSIS — G8220 Paraplegia, unspecified: Secondary | ICD-10-CM | POA: Diagnosis not present

## 2018-02-09 DIAGNOSIS — Z89611 Acquired absence of right leg above knee: Secondary | ICD-10-CM | POA: Diagnosis not present

## 2018-02-09 DIAGNOSIS — S24102S Unspecified injury at T2-T6 level of thoracic spinal cord, sequela: Secondary | ICD-10-CM | POA: Diagnosis not present

## 2018-02-09 DIAGNOSIS — Z4781 Encounter for orthopedic aftercare following surgical amputation: Secondary | ICD-10-CM | POA: Diagnosis not present

## 2018-02-10 DIAGNOSIS — S24102S Unspecified injury at T2-T6 level of thoracic spinal cord, sequela: Secondary | ICD-10-CM | POA: Diagnosis not present

## 2018-02-10 DIAGNOSIS — Z89611 Acquired absence of right leg above knee: Secondary | ICD-10-CM | POA: Diagnosis not present

## 2018-02-10 DIAGNOSIS — G8220 Paraplegia, unspecified: Secondary | ICD-10-CM | POA: Diagnosis not present

## 2018-02-10 DIAGNOSIS — Z4781 Encounter for orthopedic aftercare following surgical amputation: Secondary | ICD-10-CM | POA: Diagnosis not present

## 2018-02-10 DIAGNOSIS — I10 Essential (primary) hypertension: Secondary | ICD-10-CM | POA: Diagnosis not present

## 2018-02-11 ENCOUNTER — Encounter: Payer: Medicare Other | Admitting: Internal Medicine

## 2018-02-12 DIAGNOSIS — Z89611 Acquired absence of right leg above knee: Secondary | ICD-10-CM | POA: Diagnosis not present

## 2018-02-12 DIAGNOSIS — Z4781 Encounter for orthopedic aftercare following surgical amputation: Secondary | ICD-10-CM | POA: Diagnosis not present

## 2018-02-12 DIAGNOSIS — S24102S Unspecified injury at T2-T6 level of thoracic spinal cord, sequela: Secondary | ICD-10-CM | POA: Diagnosis not present

## 2018-02-12 DIAGNOSIS — G8220 Paraplegia, unspecified: Secondary | ICD-10-CM | POA: Diagnosis not present

## 2018-02-12 DIAGNOSIS — I10 Essential (primary) hypertension: Secondary | ICD-10-CM | POA: Diagnosis not present

## 2018-02-13 DIAGNOSIS — T24302D Burn of third degree of unspecified site of left lower limb, except ankle and foot, subsequent encounter: Secondary | ICD-10-CM | POA: Diagnosis not present

## 2018-02-13 DIAGNOSIS — L97113 Non-pressure chronic ulcer of right thigh with necrosis of muscle: Secondary | ICD-10-CM | POA: Diagnosis not present

## 2018-02-13 DIAGNOSIS — F17210 Nicotine dependence, cigarettes, uncomplicated: Secondary | ICD-10-CM | POA: Diagnosis not present

## 2018-02-13 DIAGNOSIS — T25321D Burn of third degree of right foot, subsequent encounter: Secondary | ICD-10-CM | POA: Diagnosis not present

## 2018-02-13 DIAGNOSIS — Z89611 Acquired absence of right leg above knee: Secondary | ICD-10-CM | POA: Diagnosis not present

## 2018-02-13 DIAGNOSIS — T8131XS Disruption of external operation (surgical) wound, not elsewhere classified, sequela: Secondary | ICD-10-CM | POA: Diagnosis not present

## 2018-02-13 DIAGNOSIS — T8781 Dehiscence of amputation stump: Secondary | ICD-10-CM | POA: Diagnosis not present

## 2018-02-13 DIAGNOSIS — G8220 Paraplegia, unspecified: Secondary | ICD-10-CM | POA: Diagnosis not present

## 2018-02-13 DIAGNOSIS — T310 Burns involving less than 10% of body surface: Secondary | ICD-10-CM | POA: Diagnosis not present

## 2018-02-15 ENCOUNTER — Other Ambulatory Visit: Payer: Self-pay | Admitting: Internal Medicine

## 2018-02-16 ENCOUNTER — Other Ambulatory Visit: Payer: Self-pay | Admitting: *Deleted

## 2018-02-16 MED ORDER — EZETIMIBE 10 MG PO TABS: 10.0000 mg | ORAL_TABLET | Freq: Every day | ORAL | 2 refills | Status: DC

## 2018-02-17 DIAGNOSIS — S24102S Unspecified injury at T2-T6 level of thoracic spinal cord, sequela: Secondary | ICD-10-CM | POA: Diagnosis not present

## 2018-02-17 DIAGNOSIS — I10 Essential (primary) hypertension: Secondary | ICD-10-CM | POA: Diagnosis not present

## 2018-02-17 DIAGNOSIS — G8220 Paraplegia, unspecified: Secondary | ICD-10-CM | POA: Diagnosis not present

## 2018-02-17 DIAGNOSIS — Z4781 Encounter for orthopedic aftercare following surgical amputation: Secondary | ICD-10-CM | POA: Diagnosis not present

## 2018-02-17 DIAGNOSIS — Z89611 Acquired absence of right leg above knee: Secondary | ICD-10-CM | POA: Diagnosis not present

## 2018-02-23 DIAGNOSIS — N312 Flaccid neuropathic bladder, not elsewhere classified: Secondary | ICD-10-CM | POA: Diagnosis not present

## 2018-02-24 DIAGNOSIS — I10 Essential (primary) hypertension: Secondary | ICD-10-CM | POA: Diagnosis not present

## 2018-02-24 DIAGNOSIS — Z89611 Acquired absence of right leg above knee: Secondary | ICD-10-CM | POA: Diagnosis not present

## 2018-02-24 DIAGNOSIS — Z4781 Encounter for orthopedic aftercare following surgical amputation: Secondary | ICD-10-CM | POA: Diagnosis not present

## 2018-02-24 DIAGNOSIS — S24102S Unspecified injury at T2-T6 level of thoracic spinal cord, sequela: Secondary | ICD-10-CM | POA: Diagnosis not present

## 2018-02-24 DIAGNOSIS — G8220 Paraplegia, unspecified: Secondary | ICD-10-CM | POA: Diagnosis not present

## 2018-02-25 ENCOUNTER — Telehealth: Payer: Self-pay | Admitting: Physical Medicine & Rehabilitation
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
## 2018-02-25 NOTE — Telephone Encounter (Signed)
He may have a refill until his next appointment with me.  Thanks.

## 2018-02-26 DIAGNOSIS — T8189XA Other complications of procedures, not elsewhere classified, initial encounter: Secondary | ICD-10-CM | POA: Diagnosis not present

## 2018-02-26 DIAGNOSIS — Z89611 Acquired absence of right leg above knee: Secondary | ICD-10-CM | POA: Diagnosis not present

## 2018-02-26 NOTE — Addendum Note (Signed)
Addended by: Caro Hight on: 02/26/2018 11:13 AM   Modules accepted: Orders

## 2018-02-26 NOTE — Telephone Encounter (Signed)
Maggie called to make appt but Mr Prins was headed back into surgery at [facility].  I called and spoke with his wife and let her know that we would not want to Rx tramadol until he is seen by [place] in light of this fact and his surgeon should prescribe him pain medication.  She agreed.

## 2018-02-27 DIAGNOSIS — Z888 Allergy status to other drugs, medicaments and biological substances status: Secondary | ICD-10-CM | POA: Diagnosis not present

## 2018-02-27 DIAGNOSIS — T8189XA Other complications of procedures, not elsewhere classified, initial encounter: Secondary | ICD-10-CM | POA: Diagnosis not present

## 2018-02-27 DIAGNOSIS — G8220 Paraplegia, unspecified: Secondary | ICD-10-CM | POA: Diagnosis present

## 2018-02-27 DIAGNOSIS — Z87891 Personal history of nicotine dependence: Secondary | ICD-10-CM | POA: Diagnosis not present

## 2018-02-27 DIAGNOSIS — E785 Hyperlipidemia, unspecified: Secondary | ICD-10-CM | POA: Diagnosis present

## 2018-02-27 DIAGNOSIS — Z993 Dependence on wheelchair: Secondary | ICD-10-CM | POA: Diagnosis not present

## 2018-02-27 DIAGNOSIS — I7389 Other specified peripheral vascular diseases: Secondary | ICD-10-CM | POA: Diagnosis present

## 2018-02-27 DIAGNOSIS — S24102S Unspecified injury at T2-T6 level of thoracic spinal cord, sequela: Secondary | ICD-10-CM | POA: Diagnosis not present

## 2018-02-27 DIAGNOSIS — Z880 Allergy status to penicillin: Secondary | ICD-10-CM | POA: Diagnosis not present

## 2018-02-27 DIAGNOSIS — T8789 Other complications of amputation stump: Secondary | ICD-10-CM | POA: Diagnosis present

## 2018-02-27 DIAGNOSIS — I10 Essential (primary) hypertension: Secondary | ICD-10-CM | POA: Diagnosis present

## 2018-02-27 DIAGNOSIS — K219 Gastro-esophageal reflux disease without esophagitis: Secondary | ICD-10-CM | POA: Diagnosis present

## 2018-02-27 DIAGNOSIS — I480 Paroxysmal atrial fibrillation: Secondary | ICD-10-CM | POA: Diagnosis present

## 2018-02-28 DIAGNOSIS — S24102S Unspecified injury at T2-T6 level of thoracic spinal cord, sequela: Secondary | ICD-10-CM | POA: Diagnosis not present

## 2018-02-28 DIAGNOSIS — Z4781 Encounter for orthopedic aftercare following surgical amputation: Secondary | ICD-10-CM | POA: Diagnosis not present

## 2018-02-28 DIAGNOSIS — G8220 Paraplegia, unspecified: Secondary | ICD-10-CM | POA: Diagnosis not present

## 2018-02-28 DIAGNOSIS — I10 Essential (primary) hypertension: Secondary | ICD-10-CM | POA: Diagnosis not present

## 2018-02-28 DIAGNOSIS — Z89611 Acquired absence of right leg above knee: Secondary | ICD-10-CM | POA: Diagnosis not present

## 2018-03-02 DIAGNOSIS — S24102S Unspecified injury at T2-T6 level of thoracic spinal cord, sequela: Secondary | ICD-10-CM | POA: Diagnosis not present

## 2018-03-02 DIAGNOSIS — I10 Essential (primary) hypertension: Secondary | ICD-10-CM | POA: Diagnosis not present

## 2018-03-02 DIAGNOSIS — Z4781 Encounter for orthopedic aftercare following surgical amputation: Secondary | ICD-10-CM | POA: Diagnosis not present

## 2018-03-02 DIAGNOSIS — Z89611 Acquired absence of right leg above knee: Secondary | ICD-10-CM | POA: Diagnosis not present

## 2018-03-02 DIAGNOSIS — G8220 Paraplegia, unspecified: Secondary | ICD-10-CM | POA: Diagnosis not present

## 2018-03-04 ENCOUNTER — Ambulatory Visit (INDEPENDENT_AMBULATORY_CARE_PROVIDER_SITE_OTHER): Payer: Medicare Other | Admitting: Internal Medicine

## 2018-03-04 ENCOUNTER — Encounter: Payer: Self-pay | Admitting: Internal Medicine

## 2018-03-04 ENCOUNTER — Encounter: Payer: Medicare Other | Admitting: Internal Medicine

## 2018-03-04 ENCOUNTER — Other Ambulatory Visit: Payer: Self-pay

## 2018-03-04 DIAGNOSIS — I10 Essential (primary) hypertension: Secondary | ICD-10-CM

## 2018-03-04 DIAGNOSIS — M86151 Other acute osteomyelitis, right femur: Secondary | ICD-10-CM | POA: Diagnosis not present

## 2018-03-04 DIAGNOSIS — T8743 Infection of amputation stump, right lower extremity: Secondary | ICD-10-CM | POA: Diagnosis not present

## 2018-03-04 DIAGNOSIS — Z89611 Acquired absence of right leg above knee: Secondary | ICD-10-CM | POA: Diagnosis not present

## 2018-03-04 MED ORDER — OMEPRAZOLE 40 MG PO CPDR: 40.0000 mg | DELAYED_RELEASE_CAPSULE | Freq: Every day | ORAL | 2 refills | Status: DC

## 2018-03-04 MED ORDER — AMLODIPINE BESYLATE 10 MG PO TABS: 10.0000 mg | ORAL_TABLET | Freq: Every day | ORAL | 2 refills | Status: DC

## 2018-03-04 NOTE — Progress Notes (Signed)
   CC: HTN follow up  HPI:  [name] is a 67 y.o. male with a past medical history listed below here today for follow up of his hypertension   For details of today's visit and the status of his chronic medical issues please refer to the assessment and plan.   Past Medical History:  Diagnosis Date  . Allergy   . Arthritis   . C. difficile colitis 11/2008  . Decubitus ulcer 1999   excision of right ischial pressure sore with flap reconstruction done by Dr. Denese Killings 10/31/2008  . GERD (gastroesophageal reflux disease)   . Hyperlipidemia   . Hypertension   . Paraplegia ([place])    secondary to [place]  . Perineal abscess   . Substance abuse ([place])    alcohol   Review of Systems:   No chest pain or shortness of breath  Physical Exam:  Vitals:   03/04/18 1352  BP: 126/75  Pulse: 99  Temp: 98.5 F (36.9 C)  TempSrc: Oral  SpO2: 96%  Height: 6' 1.5" (1.867 m)   GENERAL- alert, co-operative, appears as stated age, not in any distress. CARDIAC- RRR, no murmurs, rubs or gallops. RESP- Moving equal volumes of air, and clear to auscultation bilaterally, no wheezes or crackles. ABDOMEN- Soft, nontender, bowel sounds present. NEURO- No obvious Cr N abnormality. EXTREMITIES- Right AKA with wound vac in place SKIN- Warm, dry, No rash or lesion. PSYCH- Normal mood and affect, appropriate thought content and speech.   Assessment & Plan:   See Encounters Tab for problem based charting.  Patient discussed with Dr. Evette Doffing

## 2018-03-04 NOTE — Patient Instructions (Signed)
Mr. Arpino,  I am sorry you are having such a rough time. Please keep seeing the wound care doctors and the infectious disease doctors. Follow up with me in June.

## 2018-03-05 DIAGNOSIS — S24102S Unspecified injury at T2-T6 level of thoracic spinal cord, sequela: Secondary | ICD-10-CM | POA: Diagnosis not present

## 2018-03-05 DIAGNOSIS — I10 Essential (primary) hypertension: Secondary | ICD-10-CM | POA: Diagnosis not present

## 2018-03-05 DIAGNOSIS — Z89611 Acquired absence of right leg above knee: Secondary | ICD-10-CM | POA: Diagnosis not present

## 2018-03-05 DIAGNOSIS — G8220 Paraplegia, unspecified: Secondary | ICD-10-CM | POA: Diagnosis not present

## 2018-03-05 DIAGNOSIS — Z4781 Encounter for orthopedic aftercare following surgical amputation: Secondary | ICD-10-CM | POA: Diagnosis not present
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
## 2018-03-05 NOTE — Assessment & Plan Note (Signed)
BP Readings from Last 3 Encounters:  03/04/18 126/75  11/20/17 (!) 128/91  11/13/17 (!) 143/97    Lab Results  Component Value Date   NA 133 (L) 11/20/2017   K 3.1 (L) 11/20/2017   CREATININE 0.71 11/20/2017   Blood pressure is well controlled today. He is currently taking HCTZ 25 mg daily losartan 50 mg daily, and amlodipine 10 mg daily. Recent labs (in care everywhere) without any abnormalities. He denies any side effects today.  A/P Will continue current medications.

## 2018-03-06 DIAGNOSIS — G8220 Paraplegia, unspecified: Secondary | ICD-10-CM | POA: Diagnosis not present

## 2018-03-06 DIAGNOSIS — S24102S Unspecified injury at T2-T6 level of thoracic spinal cord, sequela: Secondary | ICD-10-CM | POA: Diagnosis not present

## 2018-03-06 DIAGNOSIS — I10 Essential (primary) hypertension: Secondary | ICD-10-CM | POA: Diagnosis not present

## 2018-03-06 DIAGNOSIS — Z89611 Acquired absence of right leg above knee: Secondary | ICD-10-CM | POA: Diagnosis not present

## 2018-03-06 DIAGNOSIS — Z4781 Encounter for orthopedic aftercare following surgical amputation: Secondary | ICD-10-CM | POA: Diagnosis not present

## 2018-03-06 NOTE — Progress Notes (Signed)
Internal Medicine Clinic Attending  Case discussed with Dr. Boswell at the time of the visit.  We reviewed the resident's history and exam and pertinent patient test results.  I agree with the assessment, diagnosis, and plan of care documented in the resident's note.  

## 2018-03-09 DIAGNOSIS — Z89611 Acquired absence of right leg above knee: Secondary | ICD-10-CM | POA: Diagnosis not present

## 2018-03-09 DIAGNOSIS — I10 Essential (primary) hypertension: Secondary | ICD-10-CM | POA: Diagnosis not present

## 2018-03-09 DIAGNOSIS — G8220 Paraplegia, unspecified: Secondary | ICD-10-CM | POA: Diagnosis not present

## 2018-03-09 DIAGNOSIS — Z4781 Encounter for orthopedic aftercare following surgical amputation: Secondary | ICD-10-CM | POA: Diagnosis not present

## 2018-03-09 DIAGNOSIS — S24102S Unspecified injury at T2-T6 level of thoracic spinal cord, sequela: Secondary | ICD-10-CM | POA: Diagnosis not present

## 2018-03-10 DIAGNOSIS — Z89511 Acquired absence of right leg below knee: Secondary | ICD-10-CM | POA: Diagnosis not present

## 2018-03-10 DIAGNOSIS — B9562 Methicillin resistant Staphylococcus aureus infection as the cause of diseases classified elsewhere: Secondary | ICD-10-CM | POA: Diagnosis not present

## 2018-03-10 DIAGNOSIS — B965 Pseudomonas (aeruginosa) (mallei) (pseudomallei) as the cause of diseases classified elsewhere: Secondary | ICD-10-CM | POA: Diagnosis not present

## 2018-03-10 DIAGNOSIS — Z792 Long term (current) use of antibiotics: Secondary | ICD-10-CM | POA: Diagnosis not present

## 2018-03-10 DIAGNOSIS — L97113 Non-pressure chronic ulcer of right thigh with necrosis of muscle: Secondary | ICD-10-CM | POA: Diagnosis not present

## 2018-03-10 DIAGNOSIS — M868X6 Other osteomyelitis, lower leg: Secondary | ICD-10-CM | POA: Diagnosis not present

## 2018-03-11 DIAGNOSIS — S24102S Unspecified injury at T2-T6 level of thoracic spinal cord, sequela: Secondary | ICD-10-CM | POA: Diagnosis not present

## 2018-03-11 DIAGNOSIS — Z4781 Encounter for orthopedic aftercare following surgical amputation: Secondary | ICD-10-CM | POA: Diagnosis not present

## 2018-03-11 DIAGNOSIS — I10 Essential (primary) hypertension: Secondary | ICD-10-CM | POA: Diagnosis not present

## 2018-03-11 DIAGNOSIS — G8220 Paraplegia, unspecified: Secondary | ICD-10-CM | POA: Diagnosis not present

## 2018-03-11 DIAGNOSIS — Z89611 Acquired absence of right leg above knee: Secondary | ICD-10-CM | POA: Diagnosis not present

## 2018-03-12 DIAGNOSIS — M869 Osteomyelitis, unspecified: Secondary | ICD-10-CM | POA: Diagnosis not present

## 2018-03-12 DIAGNOSIS — Z792 Long term (current) use of antibiotics: Secondary | ICD-10-CM | POA: Diagnosis not present

## 2018-03-12 DIAGNOSIS — Z452 Encounter for adjustment and management of vascular access device: Secondary | ICD-10-CM | POA: Diagnosis not present

## 2018-03-13 DIAGNOSIS — Z89611 Acquired absence of right leg above knee: Secondary | ICD-10-CM | POA: Diagnosis not present

## 2018-03-13 DIAGNOSIS — I10 Essential (primary) hypertension: Secondary | ICD-10-CM | POA: Diagnosis not present

## 2018-03-13 DIAGNOSIS — S24102S Unspecified injury at T2-T6 level of thoracic spinal cord, sequela: Secondary | ICD-10-CM | POA: Diagnosis not present

## 2018-03-13 DIAGNOSIS — G8220 Paraplegia, unspecified: Secondary | ICD-10-CM | POA: Diagnosis not present

## 2018-03-13 DIAGNOSIS — Z4781 Encounter for orthopedic aftercare following surgical amputation: Secondary | ICD-10-CM | POA: Diagnosis not present

## 2018-03-16 DIAGNOSIS — I10 Essential (primary) hypertension: Secondary | ICD-10-CM | POA: Diagnosis not present

## 2018-03-16 DIAGNOSIS — Z4781 Encounter for orthopedic aftercare following surgical amputation: Secondary | ICD-10-CM | POA: Diagnosis not present

## 2018-03-16 DIAGNOSIS — S24102S Unspecified injury at T2-T6 level of thoracic spinal cord, sequela: Secondary | ICD-10-CM | POA: Diagnosis not present

## 2018-03-16 DIAGNOSIS — Z792 Long term (current) use of antibiotics: Secondary | ICD-10-CM | POA: Diagnosis not present

## 2018-03-16 DIAGNOSIS — G8220 Paraplegia, unspecified: Secondary | ICD-10-CM | POA: Diagnosis not present

## 2018-03-16 DIAGNOSIS — Z89611 Acquired absence of right leg above knee: Secondary | ICD-10-CM | POA: Diagnosis not present

## 2018-03-17 DIAGNOSIS — L97113 Non-pressure chronic ulcer of right thigh with necrosis of muscle: Secondary | ICD-10-CM | POA: Diagnosis not present

## 2018-03-17 DIAGNOSIS — Z89511 Acquired absence of right leg below knee: Secondary | ICD-10-CM | POA: Diagnosis not present

## 2018-03-17 DIAGNOSIS — Z4889 Encounter for other specified surgical aftercare: Secondary | ICD-10-CM | POA: Diagnosis not present

## 2018-03-17 DIAGNOSIS — L97813 Non-pressure chronic ulcer of other part of right lower leg with necrosis of muscle: Secondary | ICD-10-CM | POA: Diagnosis not present

## 2018-03-17 DIAGNOSIS — Z89611 Acquired absence of right leg above knee: Secondary | ICD-10-CM | POA: Diagnosis not present

## 2018-03-17 DIAGNOSIS — M869 Osteomyelitis, unspecified: Secondary | ICD-10-CM | POA: Diagnosis not present

## 2018-03-17 DIAGNOSIS — T8131XS Disruption of external operation (surgical) wound, not elsewhere classified, sequela: Secondary | ICD-10-CM | POA: Diagnosis not present

## 2018-03-19 DIAGNOSIS — S24102S Unspecified injury at T2-T6 level of thoracic spinal cord, sequela: Secondary | ICD-10-CM | POA: Diagnosis not present

## 2018-03-19 DIAGNOSIS — Z89611 Acquired absence of right leg above knee: Secondary | ICD-10-CM | POA: Diagnosis not present

## 2018-03-19 DIAGNOSIS — I10 Essential (primary) hypertension: Secondary | ICD-10-CM | POA: Diagnosis not present

## 2018-03-19 DIAGNOSIS — Z4781 Encounter for orthopedic aftercare following surgical amputation: Secondary | ICD-10-CM | POA: Diagnosis not present

## 2018-03-19 DIAGNOSIS — G8220 Paraplegia, unspecified: Secondary | ICD-10-CM | POA: Diagnosis not present

## 2018-03-20 DIAGNOSIS — I10 Essential (primary) hypertension: Secondary | ICD-10-CM | POA: Diagnosis not present

## 2018-03-20 DIAGNOSIS — K219 Gastro-esophageal reflux disease without esophagitis: Secondary | ICD-10-CM | POA: Diagnosis not present

## 2018-03-20 DIAGNOSIS — Z792 Long term (current) use of antibiotics: Secondary | ICD-10-CM | POA: Diagnosis not present

## 2018-03-20 DIAGNOSIS — Z5181 Encounter for therapeutic drug level monitoring: Secondary | ICD-10-CM | POA: Diagnosis not present

## 2018-03-20 DIAGNOSIS — T8743 Infection of amputation stump, right lower extremity: Secondary | ICD-10-CM | POA: Diagnosis not present

## 2018-03-20 DIAGNOSIS — B965 Pseudomonas (aeruginosa) (mallei) (pseudomallei) as the cause of diseases classified elsewhere: Secondary | ICD-10-CM | POA: Diagnosis not present

## 2018-03-20 DIAGNOSIS — T24301D Burn of third degree of unspecified site of right lower limb, except ankle and foot, subsequent encounter: Secondary | ICD-10-CM | POA: Diagnosis not present

## 2018-03-20 DIAGNOSIS — M868X6 Other osteomyelitis, lower leg: Secondary | ICD-10-CM | POA: Diagnosis not present

## 2018-03-20 DIAGNOSIS — Z89521 Acquired absence of right knee: Secondary | ICD-10-CM | POA: Diagnosis not present

## 2018-03-20 DIAGNOSIS — G629 Polyneuropathy, unspecified: Secondary | ICD-10-CM | POA: Diagnosis not present

## 2018-03-20 DIAGNOSIS — B9562 Methicillin resistant Staphylococcus aureus infection as the cause of diseases classified elsewhere: Secondary | ICD-10-CM | POA: Diagnosis not present

## 2018-03-21 DIAGNOSIS — Z4781 Encounter for orthopedic aftercare following surgical amputation: Secondary | ICD-10-CM | POA: Diagnosis not present

## 2018-03-21 DIAGNOSIS — S24102S Unspecified injury at T2-T6 level of thoracic spinal cord, sequela: Secondary | ICD-10-CM | POA: Diagnosis not present

## 2018-03-21 DIAGNOSIS — Z89611 Acquired absence of right leg above knee: Secondary | ICD-10-CM | POA: Diagnosis not present

## 2018-03-21 DIAGNOSIS — I10 Essential (primary) hypertension: Secondary | ICD-10-CM | POA: Diagnosis not present

## 2018-03-21 DIAGNOSIS — G8220 Paraplegia, unspecified: Secondary | ICD-10-CM | POA: Diagnosis not present

## 2018-03-23 DIAGNOSIS — N312 Flaccid neuropathic bladder, not elsewhere classified: Secondary | ICD-10-CM | POA: Diagnosis not present

## 2018-03-23 DIAGNOSIS — Z4781 Encounter for orthopedic aftercare following surgical amputation: Secondary | ICD-10-CM | POA: Diagnosis not present

## 2018-03-23 DIAGNOSIS — Z792 Long term (current) use of antibiotics: Secondary | ICD-10-CM | POA: Diagnosis not present

## 2018-03-23 DIAGNOSIS — I10 Essential (primary) hypertension: Secondary | ICD-10-CM | POA: Diagnosis not present

## 2018-03-23 DIAGNOSIS — S24102S Unspecified injury at T2-T6 level of thoracic spinal cord, sequela: Secondary | ICD-10-CM | POA: Diagnosis not present

## 2018-03-23 DIAGNOSIS — G8220 Paraplegia, unspecified: Secondary | ICD-10-CM | POA: Diagnosis not present

## 2018-03-23 DIAGNOSIS — Z89611 Acquired absence of right leg above knee: Secondary | ICD-10-CM | POA: Diagnosis not present

## 2018-03-25 DIAGNOSIS — L97113 Non-pressure chronic ulcer of right thigh with necrosis of muscle: Secondary | ICD-10-CM | POA: Diagnosis not present

## 2018-03-25 DIAGNOSIS — Z89611 Acquired absence of right leg above knee: Secondary | ICD-10-CM | POA: Diagnosis not present

## 2018-03-25 DIAGNOSIS — K219 Gastro-esophageal reflux disease without esophagitis: Secondary | ICD-10-CM | POA: Diagnosis not present

## 2018-03-25 DIAGNOSIS — Z4781 Encounter for orthopedic aftercare following surgical amputation: Secondary | ICD-10-CM | POA: Diagnosis not present

## 2018-03-25 DIAGNOSIS — T8131XS Disruption of external operation (surgical) wound, not elsewhere classified, sequela: Secondary | ICD-10-CM | POA: Diagnosis not present

## 2018-03-25 DIAGNOSIS — S24102S Unspecified injury at T2-T6 level of thoracic spinal cord, sequela: Secondary | ICD-10-CM | POA: Diagnosis not present

## 2018-03-25 DIAGNOSIS — M869 Osteomyelitis, unspecified: Secondary | ICD-10-CM | POA: Diagnosis not present

## 2018-03-25 DIAGNOSIS — Z993 Dependence on wheelchair: Secondary | ICD-10-CM | POA: Diagnosis not present

## 2018-03-25 DIAGNOSIS — X16XXXD Contact with hot heating appliances, radiators and pipes, subsequent encounter: Secondary | ICD-10-CM | POA: Diagnosis not present

## 2018-03-25 DIAGNOSIS — Z936 Other artificial openings of urinary tract status: Secondary | ICD-10-CM | POA: Diagnosis not present

## 2018-03-25 DIAGNOSIS — Z452 Encounter for adjustment and management of vascular access device: Secondary | ICD-10-CM | POA: Diagnosis not present

## 2018-03-25 DIAGNOSIS — G8220 Paraplegia, unspecified: Secondary | ICD-10-CM | POA: Diagnosis not present

## 2018-03-25 DIAGNOSIS — I10 Essential (primary) hypertension: Secondary | ICD-10-CM | POA: Diagnosis not present

## 2018-03-25 DIAGNOSIS — F17210 Nicotine dependence, cigarettes, uncomplicated: Secondary | ICD-10-CM | POA: Diagnosis not present

## 2018-03-25 DIAGNOSIS — Z4889 Encounter for other specified surgical aftercare: Secondary | ICD-10-CM | POA: Diagnosis not present

## 2018-03-27 DIAGNOSIS — Z89611 Acquired absence of right leg above knee: Secondary | ICD-10-CM | POA: Diagnosis not present

## 2018-03-27 DIAGNOSIS — Z452 Encounter for adjustment and management of vascular access device: Secondary | ICD-10-CM | POA: Diagnosis not present

## 2018-03-27 DIAGNOSIS — G8220 Paraplegia, unspecified: Secondary | ICD-10-CM | POA: Diagnosis not present

## 2018-03-27 DIAGNOSIS — S24102S Unspecified injury at T2-T6 level of thoracic spinal cord, sequela: Secondary | ICD-10-CM | POA: Diagnosis not present

## 2018-03-27 DIAGNOSIS — Z4781 Encounter for orthopedic aftercare following surgical amputation: Secondary | ICD-10-CM | POA: Diagnosis not present

## 2018-03-30 DIAGNOSIS — Z89611 Acquired absence of right leg above knee: Secondary | ICD-10-CM | POA: Diagnosis not present

## 2018-03-30 DIAGNOSIS — Z4781 Encounter for orthopedic aftercare following surgical amputation: Secondary | ICD-10-CM | POA: Diagnosis not present

## 2018-03-30 DIAGNOSIS — Z452 Encounter for adjustment and management of vascular access device: Secondary | ICD-10-CM | POA: Diagnosis not present

## 2018-03-30 DIAGNOSIS — G8220 Paraplegia, unspecified: Secondary | ICD-10-CM | POA: Diagnosis not present

## 2018-03-30 DIAGNOSIS — Z792 Long term (current) use of antibiotics: Secondary | ICD-10-CM | POA: Diagnosis not present

## 2018-03-30 DIAGNOSIS — S24102S Unspecified injury at T2-T6 level of thoracic spinal cord, sequela: Secondary | ICD-10-CM | POA: Diagnosis not present

## 2018-04-01 DIAGNOSIS — Z89611 Acquired absence of right leg above knee: Secondary | ICD-10-CM | POA: Diagnosis not present

## 2018-04-01 DIAGNOSIS — Z452 Encounter for adjustment and management of vascular access device: Secondary | ICD-10-CM | POA: Diagnosis not present

## 2018-04-01 DIAGNOSIS — G8220 Paraplegia, unspecified: Secondary | ICD-10-CM | POA: Diagnosis not present

## 2018-04-01 DIAGNOSIS — S24102S Unspecified injury at T2-T6 level of thoracic spinal cord, sequela: Secondary | ICD-10-CM | POA: Diagnosis not present

## 2018-04-01 DIAGNOSIS — Z4781 Encounter for orthopedic aftercare following surgical amputation: Secondary | ICD-10-CM | POA: Diagnosis not present

## 2018-04-03 DIAGNOSIS — Z4781 Encounter for orthopedic aftercare following surgical amputation: Secondary | ICD-10-CM | POA: Diagnosis not present

## 2018-04-03 DIAGNOSIS — S24102S Unspecified injury at T2-T6 level of thoracic spinal cord, sequela: Secondary | ICD-10-CM | POA: Diagnosis not present

## 2018-04-03 DIAGNOSIS — Z89611 Acquired absence of right leg above knee: Secondary | ICD-10-CM | POA: Diagnosis not present

## 2018-04-03 DIAGNOSIS — Z452 Encounter for adjustment and management of vascular access device: Secondary | ICD-10-CM | POA: Diagnosis not present

## 2018-04-03 DIAGNOSIS — G8220 Paraplegia, unspecified: Secondary | ICD-10-CM | POA: Diagnosis not present

## 2018-04-06 DIAGNOSIS — G8220 Paraplegia, unspecified: Secondary | ICD-10-CM | POA: Diagnosis not present

## 2018-04-06 DIAGNOSIS — Z4781 Encounter for orthopedic aftercare following surgical amputation: Secondary | ICD-10-CM | POA: Diagnosis not present

## 2018-04-06 DIAGNOSIS — S24102S Unspecified injury at T2-T6 level of thoracic spinal cord, sequela: Secondary | ICD-10-CM | POA: Diagnosis not present

## 2018-04-06 DIAGNOSIS — Z452 Encounter for adjustment and management of vascular access device: Secondary | ICD-10-CM | POA: Diagnosis not present

## 2018-04-06 DIAGNOSIS — Z792 Long term (current) use of antibiotics: Secondary | ICD-10-CM | POA: Diagnosis not present

## 2018-04-06 DIAGNOSIS — Z89611 Acquired absence of right leg above knee: Secondary | ICD-10-CM | POA: Diagnosis not present

## 2018-04-08 ENCOUNTER — Telehealth: Payer: Self-pay | Admitting: *Deleted

## 2018-04-08 ENCOUNTER — Encounter: Payer: Self-pay | Admitting: *Deleted

## 2018-04-08 DIAGNOSIS — S24102S Unspecified injury at T2-T6 level of thoracic spinal cord, sequela: Secondary | ICD-10-CM | POA: Diagnosis not present

## 2018-04-08 DIAGNOSIS — G8220 Paraplegia, unspecified: Secondary | ICD-10-CM | POA: Diagnosis not present

## 2018-04-08 DIAGNOSIS — N3941 Urge incontinence: Secondary | ICD-10-CM | POA: Diagnosis not present

## 2018-04-08 DIAGNOSIS — Z4781 Encounter for orthopedic aftercare following surgical amputation: Secondary | ICD-10-CM | POA: Diagnosis not present

## 2018-04-08 DIAGNOSIS — Z89611 Acquired absence of right leg above knee: Secondary | ICD-10-CM | POA: Diagnosis not present

## 2018-04-08 DIAGNOSIS — N312 Flaccid neuropathic bladder, not elsewhere classified: Secondary | ICD-10-CM | POA: Diagnosis not present

## 2018-04-08 DIAGNOSIS — Z452 Encounter for adjustment and management of vascular access device: Secondary | ICD-10-CM | POA: Diagnosis not present

## 2018-04-08 NOTE — Telephone Encounter (Signed)
Appointment set for 04/09/2018 11:40 am

## 2018-04-08 NOTE — Telephone Encounter (Signed)
Yes, I need to see him again.  Please have her schedule an appointment.  Thanks.

## 2018-04-08 NOTE — Telephone Encounter (Signed)
  Patient's wife called and left a message stating bthat patient needs a refill on his tramadol. Asking what needs to be done, appointment? Last office visit 11/13/2017.  Last patient contact 02/25/2018.  Patient was instructed to receive pain medication from surgeon and we would revisit needs following surgery.Marland KitchenMarland KitchenMarland KitchenMarland Kitchenplease advise

## 2018-04-09 ENCOUNTER — Encounter: Payer: Self-pay | Admitting: Physical Medicine & Rehabilitation

## 2018-04-09 ENCOUNTER — Encounter: Payer: Medicare Other | Attending: Physical Medicine & Rehabilitation | Admitting: Physical Medicine & Rehabilitation

## 2018-04-09 VITALS — BP 178/102 | HR 85 | Ht 73.5 in | Wt 180.0 lb

## 2018-04-09 DIAGNOSIS — F1010 Alcohol abuse, uncomplicated: Secondary | ICD-10-CM | POA: Insufficient documentation

## 2018-04-09 DIAGNOSIS — G479 Sleep disorder, unspecified: Secondary | ICD-10-CM | POA: Insufficient documentation

## 2018-04-09 DIAGNOSIS — X58XXXS Exposure to other specified factors, sequela: Secondary | ICD-10-CM | POA: Diagnosis not present

## 2018-04-09 DIAGNOSIS — I10 Essential (primary) hypertension: Secondary | ICD-10-CM | POA: Insufficient documentation

## 2018-04-09 DIAGNOSIS — Z5181 Encounter for therapeutic drug level monitoring: Secondary | ICD-10-CM

## 2018-04-09 DIAGNOSIS — Z8249 Family history of ischemic heart disease and other diseases of the circulatory system: Secondary | ICD-10-CM | POA: Diagnosis not present

## 2018-04-09 DIAGNOSIS — R269 Unspecified abnormalities of gait and mobility: Secondary | ICD-10-CM | POA: Diagnosis not present

## 2018-04-09 DIAGNOSIS — F17210 Nicotine dependence, cigarettes, uncomplicated: Secondary | ICD-10-CM | POA: Insufficient documentation

## 2018-04-09 DIAGNOSIS — Z89611 Acquired absence of right leg above knee: Secondary | ICD-10-CM | POA: Diagnosis not present

## 2018-04-09 DIAGNOSIS — Z833 Family history of diabetes mellitus: Secondary | ICD-10-CM | POA: Diagnosis not present

## 2018-04-09 DIAGNOSIS — Z720 Tobacco use: Secondary | ICD-10-CM

## 2018-04-09 DIAGNOSIS — G894 Chronic pain syndrome: Secondary | ICD-10-CM

## 2018-04-09 DIAGNOSIS — Z79899 Other long term (current) drug therapy: Secondary | ICD-10-CM | POA: Insufficient documentation

## 2018-04-09 DIAGNOSIS — M79604 Pain in right leg: Secondary | ICD-10-CM | POA: Diagnosis not present

## 2018-04-09 DIAGNOSIS — Z841 Family history of disorders of kidney and ureter: Secondary | ICD-10-CM | POA: Insufficient documentation

## 2018-04-09 DIAGNOSIS — S34109S Unspecified injury to unspecified level of lumbar spinal cord, sequela: Secondary | ICD-10-CM | POA: Diagnosis not present

## 2018-04-09 DIAGNOSIS — G8220 Paraplegia, unspecified: Secondary | ICD-10-CM | POA: Diagnosis not present

## 2018-04-09 DIAGNOSIS — Z823 Family history of stroke: Secondary | ICD-10-CM | POA: Insufficient documentation

## 2018-04-09 DIAGNOSIS — Z87828 Personal history of other (healed) physical injury and trauma: Secondary | ICD-10-CM | POA: Diagnosis not present

## 2018-04-09 MED ORDER — TRAMADOL HCL 50 MG PO TABS: 50.0000 mg | ORAL_TABLET | Freq: Three times a day (TID) | ORAL | 1 refills | Status: DC
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
## 2018-04-10 DIAGNOSIS — Z452 Encounter for adjustment and management of vascular access device: Secondary | ICD-10-CM | POA: Diagnosis not present

## 2018-04-10 DIAGNOSIS — Z89611 Acquired absence of right leg above knee: Secondary | ICD-10-CM | POA: Diagnosis not present

## 2018-04-10 DIAGNOSIS — S24102S Unspecified injury at T2-T6 level of thoracic spinal cord, sequela: Secondary | ICD-10-CM | POA: Diagnosis not present

## 2018-04-10 DIAGNOSIS — Z4781 Encounter for orthopedic aftercare following surgical amputation: Secondary | ICD-10-CM | POA: Diagnosis not present

## 2018-04-10 DIAGNOSIS — G8220 Paraplegia, unspecified: Secondary | ICD-10-CM | POA: Diagnosis not present

## 2018-04-11 LAB — DRUG TOX ALC METAB W/CON, ORAL FLD: ALCOHOL METABOLITE: NEGATIVE ng/mL (ref <25)

## 2018-04-12 LAB — DRUG TOX MONITOR 1 W/CONF, ORAL FLD
Amphetamines: NEGATIVE ng/mL (ref <10)
BUPRENORPHINE: NEGATIVE ng/mL (ref <0.10)
Barbiturates: NEGATIVE ng/mL (ref <10)
Benzodiazepines: NEGATIVE ng/mL (ref <0.50)
COCAINE: NEGATIVE ng/mL (ref <5.0)
Cotinine: 74.9 ng/mL — ABNORMAL HIGH (ref <5.0)
Fentanyl: NEGATIVE ng/mL (ref <0.10)
HEROIN METABOLITE: NEGATIVE ng/mL (ref <1.0)
MARIJUANA: NEGATIVE ng/mL (ref <2.5)
MDMA: NEGATIVE ng/mL (ref <10)
METHADONE: NEGATIVE ng/mL (ref <5.0)
Meprobamate: NEGATIVE ng/mL (ref <2.5)
Nicotine Metabolite: POSITIVE ng/mL — AB (ref <5.0)
Opiates: NEGATIVE ng/mL (ref <2.5)
PHENCYCLIDINE: NEGATIVE ng/mL (ref <10)
TRAMADOL: POSITIVE ng/mL — AB (ref <5.0)
Tapentadol: NEGATIVE ng/mL (ref <5.0)
ZOLPIDEM: NEGATIVE ng/mL (ref <5.0)

## 2018-04-13 ENCOUNTER — Telehealth: Payer: Self-pay | Admitting: *Deleted

## 2018-04-13 DIAGNOSIS — M869 Osteomyelitis, unspecified: Secondary | ICD-10-CM | POA: Diagnosis not present

## 2018-04-13 DIAGNOSIS — T8789 Other complications of amputation stump: Secondary | ICD-10-CM | POA: Diagnosis not present

## 2018-04-13 DIAGNOSIS — Z792 Long term (current) use of antibiotics: Secondary | ICD-10-CM | POA: Diagnosis not present

## 2018-04-13 DIAGNOSIS — S24102S Unspecified injury at T2-T6 level of thoracic spinal cord, sequela: Secondary | ICD-10-CM | POA: Diagnosis not present

## 2018-04-13 DIAGNOSIS — L97113 Non-pressure chronic ulcer of right thigh with necrosis of muscle: Secondary | ICD-10-CM | POA: Diagnosis not present

## 2018-04-13 DIAGNOSIS — M792 Neuralgia and neuritis, unspecified: Secondary | ICD-10-CM | POA: Diagnosis not present

## 2018-04-13 DIAGNOSIS — Z89611 Acquired absence of right leg above knee: Secondary | ICD-10-CM | POA: Diagnosis not present

## 2018-04-13 DIAGNOSIS — Z4781 Encounter for orthopedic aftercare following surgical amputation: Secondary | ICD-10-CM | POA: Diagnosis not present

## 2018-04-13 DIAGNOSIS — E46 Unspecified protein-calorie malnutrition: Secondary | ICD-10-CM | POA: Diagnosis not present

## 2018-04-13 DIAGNOSIS — G8220 Paraplegia, unspecified: Secondary | ICD-10-CM | POA: Diagnosis not present

## 2018-04-13 DIAGNOSIS — Z452 Encounter for adjustment and management of vascular access device: Secondary | ICD-10-CM | POA: Diagnosis not present

## 2018-04-13 NOTE — Telephone Encounter (Signed)
Oral swab drug screen was consistent for prescribed medications.  ?

## 2018-04-15 ENCOUNTER — Other Ambulatory Visit: Payer: Self-pay

## 2018-04-15 ENCOUNTER — Emergency Department (HOSPITAL_COMMUNITY): Payer: Medicare Other

## 2018-04-15 ENCOUNTER — Encounter (HOSPITAL_COMMUNITY): Payer: Self-pay

## 2018-04-15 ENCOUNTER — Emergency Department (HOSPITAL_COMMUNITY)
Admission: EM | Admit: 2018-04-15 | Discharge: 2018-04-16 | Disposition: A | Discharge status: Other Acute Inpatient Hospital | Payer: Medicare Other | Attending: Emergency Medicine | Admitting: Emergency Medicine

## 2018-04-15 DIAGNOSIS — M869 Osteomyelitis, unspecified: Secondary | ICD-10-CM | POA: Diagnosis not present

## 2018-04-15 DIAGNOSIS — I444 Left anterior fascicular block: Secondary | ICD-10-CM | POA: Diagnosis not present

## 2018-04-15 DIAGNOSIS — I10 Essential (primary) hypertension: Secondary | ICD-10-CM | POA: Insufficient documentation

## 2018-04-15 DIAGNOSIS — R4182 Altered mental status, unspecified: Secondary | ICD-10-CM | POA: Diagnosis present

## 2018-04-15 DIAGNOSIS — R918 Other nonspecific abnormal finding of lung field: Secondary | ICD-10-CM | POA: Diagnosis not present

## 2018-04-15 DIAGNOSIS — R402411 Glasgow coma scale score 13-15, in the field [EMT or ambulance]: Secondary | ICD-10-CM | POA: Diagnosis not present

## 2018-04-15 DIAGNOSIS — Z452 Encounter for adjustment and management of vascular access device: Secondary | ICD-10-CM | POA: Diagnosis not present

## 2018-04-15 DIAGNOSIS — Z79899 Other long term (current) drug therapy: Secondary | ICD-10-CM | POA: Diagnosis not present

## 2018-04-15 DIAGNOSIS — S24102S Unspecified injury at T2-T6 level of thoracic spinal cord, sequela: Secondary | ICD-10-CM | POA: Diagnosis not present

## 2018-04-15 DIAGNOSIS — A419 Sepsis, unspecified organism: Secondary | ICD-10-CM | POA: Diagnosis not present

## 2018-04-15 DIAGNOSIS — Z89611 Acquired absence of right leg above knee: Secondary | ICD-10-CM | POA: Insufficient documentation

## 2018-04-15 DIAGNOSIS — F17210 Nicotine dependence, cigarettes, uncomplicated: Secondary | ICD-10-CM | POA: Diagnosis not present

## 2018-04-15 DIAGNOSIS — M86651 Other chronic osteomyelitis, right thigh: Secondary | ICD-10-CM | POA: Insufficient documentation

## 2018-04-15 DIAGNOSIS — I4891 Unspecified atrial fibrillation: Secondary | ICD-10-CM | POA: Insufficient documentation

## 2018-04-15 DIAGNOSIS — R531 Weakness: Secondary | ICD-10-CM | POA: Diagnosis not present

## 2018-04-15 DIAGNOSIS — R509 Fever, unspecified: Secondary | ICD-10-CM

## 2018-04-15 DIAGNOSIS — L97113 Non-pressure chronic ulcer of right thigh with necrosis of muscle: Secondary | ICD-10-CM | POA: Diagnosis not present

## 2018-04-15 DIAGNOSIS — R6521 Severe sepsis with septic shock: Secondary | ICD-10-CM | POA: Insufficient documentation

## 2018-04-15 DIAGNOSIS — Z89619 Acquired absence of unspecified leg above knee: Secondary | ICD-10-CM | POA: Diagnosis not present

## 2018-04-15 DIAGNOSIS — Z4781 Encounter for orthopedic aftercare following surgical amputation: Secondary | ICD-10-CM | POA: Diagnosis not present

## 2018-04-15 DIAGNOSIS — G8220 Paraplegia, unspecified: Secondary | ICD-10-CM | POA: Diagnosis not present

## 2018-04-15 LAB — COMPREHENSIVE METABOLIC PANEL
ALK PHOS: 99 U/L (ref 38–126)
ALT: 23 U/L (ref 17–63)
ANION GAP: 13 (ref 5–15)
AST: 24 U/L (ref 15–41)
Albumin: 3.6 g/dL (ref 3.5–5.0)
BILIRUBIN TOTAL: 1.2 mg/dL (ref 0.3–1.2)
BUN: 15 mg/dL (ref 6–20)
CALCIUM: 8.7 mg/dL — AB (ref 8.9–10.3)
CO2: 21 mmol/L — ABNORMAL LOW (ref 22–32)
Chloride: 98 mmol/L — ABNORMAL LOW (ref 101–111)
Creatinine, Ser: 1 mg/dL (ref 0.61–1.24)
GFR calc Af Amer: >60 mL/min (ref >60)
Glucose, Bld: 109 mg/dL — ABNORMAL HIGH (ref 65–99)
POTASSIUM: 2.7 mmol/L — AB (ref 3.5–5.1)
Sodium: 132 mmol/L — ABNORMAL LOW (ref 135–145)
TOTAL PROTEIN: 7.5 g/dL (ref 6.5–8.1)

## 2018-04-15 LAB — URINALYSIS, ROUTINE W REFLEX MICROSCOPIC
Bilirubin Urine: NEGATIVE
Glucose, UA: NEGATIVE mg/dL
KETONES UR: NEGATIVE mg/dL
NITRITE: POSITIVE — AB
PH: 6 (ref 5.0–8.0)
PROTEIN: 100 mg/dL — AB
Specific Gravity, Urine: 1.012 (ref 1.005–1.030)

## 2018-04-15 LAB — CBC WITH DIFFERENTIAL/PLATELET
BASOS ABS: 0 10*3/uL (ref 0.0–0.1)
BASOS PCT: 0 %
EOS PCT: 0 %
Eosinophils Absolute: 0 10*3/uL (ref 0.0–0.7)
HEMATOCRIT: 42.7 % (ref 39.0–52.0)
Hemoglobin: 14.3 g/dL (ref 13.0–17.0)
LYMPHS PCT: 3 %
Lymphs Abs: 0.4 10*3/uL — ABNORMAL LOW (ref 0.7–4.0)
MCH: 26.3 pg (ref 26.0–34.0)
MCHC: 33.5 g/dL (ref 30.0–36.0)
MCV: 78.5 fL (ref 78.0–100.0)
Monocytes Absolute: 0.2 10*3/uL (ref 0.1–1.0)
Monocytes Relative: 1 %
NEUTROS ABS: 13.1 10*3/uL — AB (ref 1.7–7.7)
Neutrophils Relative %: 96 %
PLATELETS: 240 10*3/uL (ref 150–400)
RBC: 5.44 MIL/uL (ref 4.22–5.81)
RDW: 17.3 % — AB (ref 11.5–15.5)
WBC: 13.6 10*3/uL — AB (ref 4.0–10.5)

## 2018-04-15 LAB — TROPONIN I: Troponin I: <0.03 ng/mL (ref <0.03)

## 2018-04-15 LAB — LACTIC ACID, PLASMA
LACTIC ACID, VENOUS: 3 mmol/L — AB (ref 0.5–1.9)
Lactic Acid, Venous: 3.2 mmol/L (ref 0.5–1.9)

## 2018-04-15 MED ORDER — SODIUM CHLORIDE 0.9 % IV BOLUS
500.0000 mL | Freq: Once | INTRAVENOUS | Status: AC
  Administered 2018-04-15: 500 mL via INTRAVENOUS

## 2018-04-15 MED ORDER — ACETAMINOPHEN 500 MG PO TABS
1000.0000 mg | ORAL_TABLET | Freq: Once | ORAL | Status: AC
  Administered 2018-04-15: 1000 mg via ORAL

## 2018-04-15 MED ORDER — LACTATED RINGERS IV BOLUS (SEPSIS)
500.0000 mL | Freq: Once | INTRAVENOUS | Status: AC
  Administered 2018-04-15: 500 mL via INTRAVENOUS

## 2018-04-15 MED ORDER — LACTATED RINGERS IV BOLUS (SEPSIS)
1000.0000 mL | Freq: Once | INTRAVENOUS | Status: AC
  Administered 2018-04-15: 1000 mL via INTRAVENOUS

## 2018-04-15 MED ORDER — ACETAMINOPHEN 500 MG PO TABS
ORAL_TABLET | ORAL | Status: AC
  Administered 2018-04-15: 1000 mg via ORAL
  Filled 2018-04-15: qty 2

## 2018-04-15 MED ORDER — SODIUM CHLORIDE 0.9 % IV SOLN
2.0000 g | Freq: Once | INTRAVENOUS | Status: AC
  Administered 2018-04-15: 2 g via INTRAVENOUS
  Filled 2018-04-15: qty 2

## 2018-04-15 NOTE — ED Provider Notes (Signed)
[facility] EMERGENCY DEPARTMENT Provider Note   CSN: [id number] Arrival date & time: 04/15/18  1850     History   Chief Complaint Chief Complaint  Patient presents with  . Altered Mental Status    HPI Shawn Gappa. is a 67 y.o. male.  HPI Patient presents with altered mental status and fever.  Has a chronic osteomyelitis on his right thigh stump.  Had the wound VAC removed today and was supposed to be reapplied wound care nurse came and he was more altered.  Upon arrival has some confusion and has rectal temperature of 106.  Initial heart rate 130.  Patient states he does not feel so well.  He is already on daptomycin through PICC line and oral Cipro for Pseudomonas and staph aureus. Past Medical History:  Diagnosis Date  . Allergy   . Arthritis   . C. difficile colitis 11/2008  . Decubitus ulcer 1999   excision of right ischial pressure sore with flap reconstruction done by Dr. Denese Killings 10/31/2008  . GERD (gastroesophageal reflux disease)   . Hyperlipidemia   . Hypertension   . Paraplegia ([place])    secondary to [place]  . Perineal abscess   . Substance abuse ([place])    alcohol    Patient Active Problem List   Diagnosis Date Noted  . S/P AKA (above knee amputation) unilateral, right ([place]) 03/05/2018  . Benign neoplasm of cecum   . Benign neoplasm of ascending colon   . Colon cancer screening 08/25/2017  . Opioid type dependence, continuous ([place]) 12/17/2016  . Bilateral shoulder pain 09/07/2015  . Cataracts, bilateral 01/30/2015  . Tobacco use 07/15/2013  . Insomnia, transient 07/15/2013  . Decubital ulcer 02/01/2013  . Health care maintenance 09/29/2012  . Decubitus ulcer of right ischium 07/14/2012  . Decubitus ulcer of buttock 04/20/2012  . CHRONIC PAIN SYNDROME 06/26/2010  . Hyperlipidemia 12/29/2006  . Paraplegic spinal paralysis ([place]) 12/29/2006  . HYPERTENSION, BENIGN ESSENTIAL 12/29/2006  . GERD (gastroesophageal reflux disease) 12/29/2006    Past  Surgical History:  Procedure Laterality Date  . BONE BIOPSY  11/2007   negative for organisms  . COLONOSCOPY WITH PROPOFOL N/A 08/27/2017   Procedure: COLONOSCOPY WITH PROPOFOL;  Surgeon: Irene Shipper, MD;  Location: [facility];  Service: Endoscopy;  Laterality: N/A;  . sacral wound flap  2002   done by Dr. Stephanie Coup  . SPINE SURGERY    . THROAT SURGERY          Home Medications    Prior to Admission medications   Medication Sig Start Date End Date Taking? Authorizing Provider  amLODipine (NORVASC) 10 MG tablet Take 1 tablet (10 mg total) by mouth daily. 03/04/18 03/04/19 Yes Maryellen Pile, MD  ciprofloxacin (CIPRO) 750 MG tablet Take 750 mg by mouth 2 (two) times daily.   Yes [provider]  DAPTOmycin in sodium chloride 0.9 % 100 mL Inject 625 mg into the vein daily. Via PICC LINE over 5-10 minutes.   Yes [provider]  diclofenac sodium (VOLTAREN) 1 % GEL APPLY TOPICALLY 4 TIMES DAILY. 11/13/17  Yes Jamse Arn, MD  gabapentin (NEURONTIN) 300 MG capsule Take 300 mg by mouth 3 (three) times daily.   Yes [provider]  hydrochlorothiazide (HYDRODIURIL) 25 MG tablet TAKE 1 TABLET (25 MG TOTAL) BY MOUTH DAILY. 11/03/17  Yes Maryellen Pile, MD  lisinopril (PRINIVIL,ZESTRIL) 20 MG tablet Take 20 mg by mouth daily.   Yes [provider]  losartan (COZAAR)  50 MG tablet Take 1 tablet (50 mg total) by mouth daily. 01/08/18 01/08/19 Yes Maryellen Pile, MD  methocarbamol (ROBAXIN) 750 MG tablet Take 1 tablet (750 mg total) by mouth 3 (three) times daily. As needed for muscle spasms 09/29/17  Yes Jamse Arn, MD  omeprazole (PRILOSEC) 40 MG capsule Take 1 capsule (40 mg total) by mouth daily. 03/04/18  Yes Maryellen Pile, MD  potassium chloride SA (K-DUR,KLOR-CON) 20 MEQ tablet Take 1 tablet (20 mEq total) by mouth 2 (two) times daily. 94/70/96  Yes Delora Fuel, MD  traMADol (ULTRAM) 50 MG tablet Take 1 tablet (50 mg total) by mouth 3 (three)  times daily. Patient taking differently: Take 100 mg by mouth 3 (three) times daily as needed for moderate pain or severe pain.  04/09/18  Yes Jamse Arn, MD  zolpidem (AMBIEN CR) 6.25 MG CR tablet TAKE 1 TABLET BY MOUTH AT BEDTIME AS NEEDED FOR SLEEP 12/16/17  Yes Maryellen Pile, MD  ezetimibe (ZETIA) 10 MG tablet Take 1 tablet (10 mg total) by mouth daily. 02/16/18   Maryellen Pile, MD    Family History Family History  Problem Relation Age of Onset  . Stroke Mother   . Coronary artery disease Father   . Coronary artery disease Paternal Uncle   . Coronary artery disease Paternal Aunt     Social History Social History   Tobacco Use  . Smoking status: Current Every Day Smoker    Packs/day: 0.50    Years: 40.00    Pack years: 20.00    Types: Cigarettes  . Smokeless tobacco: Never Used  . Tobacco comment: stopped in 12/2017   Substance Use Topics  . Alcohol use: No    Alcohol/week: 0.0 oz  . Drug use: No     Allergies   Penicillins and Statins   Review of Systems Review of Systems Level 5 caveat due to altered mental status.  Physical Exam Updated Vital Signs BP (!) 82/57   Pulse (!) 134   Temp (!) 100.8 F (38.2 C) (Rectal)   Resp 18   Wt 77.1 kg (170 lb)   SpO2 98%   BMI 22.12 kg/m   Physical Exam Well-developed HEENT: Pupils reactive.  Face symmetric. Neck: No JVD trachea midline Chest: Mildly harsh breath sounds. Cardiac: Tachycardia without murmur. Abdominal: No tenderness. Extremities: PICC line right upper extremity.  No erythema or drainage.  Right lower extremity shows above-the-knee amputation with dressing.  Dressing removed and showed central wound without drainage.  Pitting edema of left lower extremity Neurologic: Awake and pleasant but some confusion. Genitourinary: Suprapubic catheter in place without drainage.  ED Treatments / Results  Labs (all labs ordered are listed, but only abnormal results are displayed) Labs Reviewed    COMPREHENSIVE METABOLIC PANEL - Abnormal; Notable for the following components:      Result Value   Sodium 132 (*)    Potassium 2.7 (*)    Chloride 98 (*)    CO2 21 (*)    Glucose, Bld 109 (*)    Calcium 8.7 (*)    All other components within normal limits  CBC WITH DIFFERENTIAL/PLATELET - Abnormal; Notable for the following components:   WBC 13.6 (*)    RDW 17.3 (*)    Neutro Abs 13.1 (*)    Lymphs Abs 0.4 (*)    All other components within normal limits  URINALYSIS, ROUTINE W REFLEX MICROSCOPIC - Abnormal; Notable for the following components:   APPearance CLOUDY (*)  Hgb urine dipstick MODERATE (*)    Protein, ur 100 (*)    Nitrite POSITIVE (*)    Leukocytes, UA LARGE (*)    WBC, UA >50 (*)    Bacteria, UA FEW (*)    All other components within normal limits  LACTIC ACID, PLASMA - Abnormal; Notable for the following components:   Lactic Acid, Venous 3.0 (*)    All other components within normal limits  LACTIC ACID, PLASMA - Abnormal; Notable for the following components:   Lactic Acid, Venous 3.2 (*)    All other components within normal limits  CULTURE, BLOOD (ROUTINE X 2)  CULTURE, BLOOD (ROUTINE X 2)  TROPONIN I    EKG EKG Interpretation  Date/Time:  Wednesday Apr 15 2018 19:09:56 EDT Ventricular Rate:  133 PR Interval:    QRS Duration: 91 QT Interval:  273 QTC Calculation: 406 R Axis:   -77 Text Interpretation:  Sinus tachycardia Multiple ventricular premature complexes Probable left atrial enlargement Left anterior fascicular block Right ventricular hypertrophy Nonspecific T abnrm, anterolateral leads Confirmed by Davonna Belling [phone]) on 04/15/2018 8:37:15 PM   EKG Interpretation  Date/Time:  Wednesday Apr 15 2018 20:36:19 EDT Ventricular Rate:  149 PR Interval:    QRS Duration: 97 QT Interval:  351 QTC Calculation: 553 R Axis:   -71 Text Interpretation:  Atrial fibrillation Multiple ventricular premature complexes Left anterior fascicular block  Consider right ventricular hypertrophy ST depression, probably rate related Prolonged QT interval Confirmed by Davonna Belling [phone]) on 04/15/2018 9:14:46 PM        Radiology Dg Chest Port 1 View  Result Date: 04/15/2018 CLINICAL DATA:  Code sepsis. EXAM: PORTABLE CHEST 1 VIEW COMPARISON:  07/04/2011. FINDINGS: The heart is enlarged. BILATERAL pulmonary opacities are noted which could represent diffuse pneumonia or pulmonary edema. No effusion or pneumothorax. No dense consolidation. RIGHT arm PICC line tip proximal RIGHT atrium. Previous cervical fusion. IMPRESSION: Cardiomegaly. BILATERAL pulmonary opacities which could represent pneumonia or pulmonary edema. Worsening aeration from priors. Electronically Signed   By: Staci Righter M.D.   On: 04/15/2018 19:49    Procedures Procedures (including critical care time)  Medications Ordered in ED Medications  acetaminophen (TYLENOL) tablet 1,000 mg (1,000 mg Oral Given 04/15/18 1910)  lactated ringers bolus 1,000 mL (0 mLs Intravenous Stopped 04/15/18 2030)    And  lactated ringers bolus 1,000 mL (0 mLs Intravenous Stopped 04/15/18 2040)    And  lactated ringers bolus 500 mL (0 mLs Intravenous Stopped 04/15/18 2131)  aztreonam (AZACTAM) 2 g in sodium chloride 0.9 % 100 mL IVPB (0 g Intravenous Stopped 04/15/18 2002)     Initial Impression / Assessment and Plan / ED Course  I have reviewed the triage vital signs and the nursing notes.  Pertinent labs & imaging results that were available during my care of the patient were reviewed by me and considered in my medical decision making (see chart for details).     Patient presents with altered mental status and fever.  Up to 106 on arrival.  Initial blood pressure was good but was tachycardic and febrile.  Blood pressure later decreased and patient went to atrial fibrillation with RVR.  X-ray showed possible pneumonia and urine is potential infection.  Has PICC line on right arm but does not appear to  be infected.  Right thigh stump no drainage.  Patient was given lactated Ringer bolus and code sepsis had been called.  Discussed with Dr. Linus Salmons from infectious disease and started on  aztreonam.  Patient is already on daptomycin and Cipro.  Patient developed atrial fibrillation that resolved with fluid bolus.  Still has some hypotension.  Initial lactic acid of 3 and repeat also 3.  However does have hypertension.  Discussed with Dr. Lowry Ram at [facility] and accepted transfer to the MICU.    CRITICAL CARE Performed by: Davonna Belling Total critical care time: 45 minutes Critical care time was exclusive of separately billable procedures and treating other patients. Critical care was necessary to treat or prevent imminent or life-threatening deterioration. Critical care was time spent personally by me on the following activities: development of treatment plan with patient and/or surrogate as well as nursing, discussions with consultants, evaluation of patient's response to treatment, examination of patient, obtaining history from patient or surrogate, ordering and performing treatments and interventions, ordering and review of laboratory studies, ordering and review of radiographic studies, pulse oximetry and re-evaluation of patient's condition.   Final Clinical Impressions(s) / ED Diagnoses   Final diagnoses:  Atrial fibrillation with rapid ventricular response ([place])  Septic shock [facility])    ED Discharge Orders    None       Davonna Belling, MD 04/15/18 2234

## 2018-04-15 NOTE — ED Notes (Signed)
Report given to [facility] with [facility] ground transport.

## 2018-04-15 NOTE — ED Notes (Signed)
Date and time results received: 04/15/18 9:00 PM (use smartphrase ".now" to insert current time)  Test: Potassium Critical Value: 2.7  Name of Provider Notified: Dr. Alvino Chapel  Orders Received? Or Actions Taken? None at this time

## 2018-04-15 NOTE — ED Notes (Signed)
Report given to Anda Kraft RN at [facility], [place] at [phone].

## 2018-04-15 NOTE — ED Notes (Signed)
Date and time results received: 04/15/18 2011 (use smartphrase ".now" to insert current time)  Test: Lactic acid Critical Value: 3.0  Name of Provider Notified: Alvino Chapel  Orders Received? Or Actions Taken?:

## 2018-04-15 NOTE — ED Notes (Signed)
Date and time results received: 04/15/18 @22 :02   Test: lactic acid Critical Value: 3.2  Name of Provider Notified: Dr Alvino Chapel  Orders Received? Or Actions Taken?: no additional orders given,

## 2018-04-15 NOTE — ED Triage Notes (Signed)
Pt brought in by EMS due to altered mental status and fever. Pt had right AKA since march. Went to [place] for follow up today and wound vac removed. Reported every thing looked good. Home health visited this afternoon and noted to be altered and has a fever. Temp 100.4. IV antibiotics via picc line and oral antibiotics

## 2018-04-15 NOTE — Progress Notes (Signed)
Pharmacy Note:  Initial antibiotic(s) regimen of Aztreonam ordered by EDP to treat Sepsis.  CrCl cannot be calculated (Patient's most recent lab result is older than the maximum 21 days allowed.).   Allergies  Allergen Reactions  . Penicillins Hives    Has patient had a PCN reaction causing immediate rash, facial/tongue/throat swelling, SOB or lightheadedness with hypotension: Yes Has patient had a PCN reaction causing severe rash involving mucus membranes or skin necrosis: Unknown Has patient had a PCN reaction that required hospitalization: No Has patient had a PCN reaction occurring within the last 10 years: Yes If all of the above answers are "NO", then may proceed with Cephalosporin use.   . Statins     Uncontrolled diarrhea    Vitals:   04/15/18 1903 04/15/18 1905  BP:  (!) 151/94  Pulse:  (!) 134  Resp:  (!) 26  Temp: (!) 106.4 F (41.3 C)   SpO2:  100%    Anti-infectives (From admission, onward)   Start     Dose/Rate Route Frequency Ordered Stop   04/15/18 1930  aztreonam (AZACTAM) 2 g in sodium chloride 0.9 % 100 mL IVPB     2 g 200 mL/hr over 30 Minutes Intravenous  Once 04/15/18 1920       Also on Daptomycin and Cipro as outpatient per EDP, recent dose not clear.  Recent AKA at [facility].  Transfer to [facility] this evening likely per MD/RN.  Plan: Initial dose(s) of Aztreonam X 1 ordered. F/U admission orders for further dosing if therapy continued.  Pricilla Larsson, [facility] 04/15/2018 7:21 PM

## 2018-04-16 DIAGNOSIS — E785 Hyperlipidemia, unspecified: Secondary | ICD-10-CM | POA: Diagnosis present

## 2018-04-16 DIAGNOSIS — Z888 Allergy status to other drugs, medicaments and biological substances status: Secondary | ICD-10-CM | POA: Diagnosis not present

## 2018-04-16 DIAGNOSIS — Z87891 Personal history of nicotine dependence: Secondary | ICD-10-CM | POA: Diagnosis not present

## 2018-04-16 DIAGNOSIS — R9431 Abnormal electrocardiogram [ECG] [EKG]: Secondary | ICD-10-CM | POA: Diagnosis not present

## 2018-04-16 DIAGNOSIS — M869 Osteomyelitis, unspecified: Secondary | ICD-10-CM | POA: Diagnosis not present

## 2018-04-16 DIAGNOSIS — T8743 Infection of amputation stump, right lower extremity: Secondary | ICD-10-CM | POA: Diagnosis present

## 2018-04-16 DIAGNOSIS — R6521 Severe sepsis with septic shock: Secondary | ICD-10-CM | POA: Diagnosis present

## 2018-04-16 DIAGNOSIS — Z993 Dependence on wheelchair: Secondary | ICD-10-CM | POA: Diagnosis not present

## 2018-04-16 DIAGNOSIS — M868X8 Other osteomyelitis, other site: Secondary | ICD-10-CM | POA: Diagnosis not present

## 2018-04-16 DIAGNOSIS — Z823 Family history of stroke: Secondary | ICD-10-CM | POA: Diagnosis not present

## 2018-04-16 DIAGNOSIS — T24301D Burn of third degree of unspecified site of right lower limb, except ankle and foot, subsequent encounter: Secondary | ICD-10-CM | POA: Diagnosis not present

## 2018-04-16 DIAGNOSIS — Z8249 Family history of ischemic heart disease and other diseases of the circulatory system: Secondary | ICD-10-CM | POA: Diagnosis not present

## 2018-04-16 DIAGNOSIS — I517 Cardiomegaly: Secondary | ICD-10-CM | POA: Diagnosis not present

## 2018-04-16 DIAGNOSIS — K219 Gastro-esophageal reflux disease without esophagitis: Secondary | ICD-10-CM | POA: Diagnosis present

## 2018-04-16 DIAGNOSIS — T80211A Bloodstream infection due to central venous catheter, initial encounter: Secondary | ICD-10-CM | POA: Diagnosis present

## 2018-04-16 DIAGNOSIS — B9620 Unspecified Escherichia coli [E. coli] as the cause of diseases classified elsewhere: Secondary | ICD-10-CM | POA: Diagnosis not present

## 2018-04-16 DIAGNOSIS — L89159 Pressure ulcer of sacral region, unspecified stage: Secondary | ICD-10-CM | POA: Diagnosis not present

## 2018-04-16 DIAGNOSIS — R509 Fever, unspecified: Secondary | ICD-10-CM | POA: Diagnosis not present

## 2018-04-16 DIAGNOSIS — T148XXA Other injury of unspecified body region, initial encounter: Secondary | ICD-10-CM | POA: Diagnosis not present

## 2018-04-16 DIAGNOSIS — G629 Polyneuropathy, unspecified: Secondary | ICD-10-CM | POA: Diagnosis not present

## 2018-04-16 DIAGNOSIS — G8220 Paraplegia, unspecified: Secondary | ICD-10-CM | POA: Diagnosis present

## 2018-04-16 DIAGNOSIS — T83511A Infection and inflammatory reaction due to indwelling urethral catheter, initial encounter: Secondary | ICD-10-CM | POA: Diagnosis not present

## 2018-04-16 DIAGNOSIS — A4151 Sepsis due to Escherichia coli [E. coli]: Secondary | ICD-10-CM | POA: Diagnosis present

## 2018-04-16 DIAGNOSIS — N3000 Acute cystitis without hematuria: Secondary | ICD-10-CM | POA: Diagnosis present

## 2018-04-16 DIAGNOSIS — Z79899 Other long term (current) drug therapy: Secondary | ICD-10-CM | POA: Diagnosis not present

## 2018-04-16 DIAGNOSIS — M868X6 Other osteomyelitis, lower leg: Secondary | ICD-10-CM | POA: Diagnosis present

## 2018-04-16 DIAGNOSIS — Z1612 Extended spectrum beta lactamase (ESBL) resistance: Secondary | ICD-10-CM | POA: Diagnosis present

## 2018-04-16 DIAGNOSIS — R109 Unspecified abdominal pain: Secondary | ICD-10-CM | POA: Diagnosis not present

## 2018-04-16 DIAGNOSIS — B965 Pseudomonas (aeruginosa) (mallei) (pseudomallei) as the cause of diseases classified elsewhere: Secondary | ICD-10-CM | POA: Diagnosis present

## 2018-04-16 DIAGNOSIS — R4182 Altered mental status, unspecified: Secondary | ICD-10-CM | POA: Diagnosis not present

## 2018-04-16 DIAGNOSIS — I4891 Unspecified atrial fibrillation: Secondary | ICD-10-CM | POA: Diagnosis present

## 2018-04-16 DIAGNOSIS — Z89511 Acquired absence of right leg below knee: Secondary | ICD-10-CM | POA: Diagnosis not present

## 2018-04-16 DIAGNOSIS — B9561 Methicillin susceptible Staphylococcus aureus infection as the cause of diseases classified elsewhere: Secondary | ICD-10-CM | POA: Diagnosis not present

## 2018-04-16 DIAGNOSIS — Z89611 Acquired absence of right leg above knee: Secondary | ICD-10-CM | POA: Diagnosis not present

## 2018-04-16 DIAGNOSIS — A419 Sepsis, unspecified organism: Secondary | ICD-10-CM | POA: Diagnosis not present

## 2018-04-16 DIAGNOSIS — I959 Hypotension, unspecified: Secondary | ICD-10-CM | POA: Diagnosis not present

## 2018-04-16 DIAGNOSIS — I491 Atrial premature depolarization: Secondary | ICD-10-CM | POA: Diagnosis not present

## 2018-04-16 DIAGNOSIS — S24102S Unspecified injury at T2-T6 level of thoracic spinal cord, sequela: Secondary | ICD-10-CM | POA: Diagnosis not present

## 2018-04-16 DIAGNOSIS — Z452 Encounter for adjustment and management of vascular access device: Secondary | ICD-10-CM | POA: Diagnosis not present

## 2018-04-16 DIAGNOSIS — Z880 Allergy status to penicillin: Secondary | ICD-10-CM | POA: Diagnosis not present

## 2018-04-16 DIAGNOSIS — N390 Urinary tract infection, site not specified: Secondary | ICD-10-CM | POA: Diagnosis not present

## 2018-04-16 DIAGNOSIS — Z9359 Other cystostomy status: Secondary | ICD-10-CM | POA: Diagnosis not present

## 2018-04-16 DIAGNOSIS — I459 Conduction disorder, unspecified: Secondary | ICD-10-CM | POA: Diagnosis not present

## 2018-04-16 DIAGNOSIS — M792 Neuralgia and neuritis, unspecified: Secondary | ICD-10-CM | POA: Diagnosis not present

## 2018-04-16 DIAGNOSIS — R7881 Bacteremia: Secondary | ICD-10-CM | POA: Diagnosis not present

## 2018-04-16 DIAGNOSIS — I493 Ventricular premature depolarization: Secondary | ICD-10-CM | POA: Diagnosis not present

## 2018-04-16 DIAGNOSIS — Z960 Presence of urogenital implants: Secondary | ICD-10-CM | POA: Diagnosis not present

## 2018-04-16 DIAGNOSIS — I10 Essential (primary) hypertension: Secondary | ICD-10-CM | POA: Diagnosis present

## 2018-04-16 DIAGNOSIS — B9562 Methicillin resistant Staphylococcus aureus infection as the cause of diseases classified elsewhere: Secondary | ICD-10-CM | POA: Diagnosis present

## 2018-04-16 DIAGNOSIS — E876 Hypokalemia: Secondary | ICD-10-CM | POA: Diagnosis not present

## 2018-04-16 DIAGNOSIS — Z4781 Encounter for orthopedic aftercare following surgical amputation: Secondary | ICD-10-CM | POA: Diagnosis not present

## 2018-04-16 LAB — BLOOD CULTURE ID PANEL (REFLEXED)
Acinetobacter baumannii: NOT DETECTED
CANDIDA GLABRATA: NOT DETECTED
CANDIDA KRUSEI: NOT DETECTED
CANDIDA PARAPSILOSIS: NOT DETECTED
CANDIDA TROPICALIS: NOT DETECTED
CARBAPENEM RESISTANCE: NOT DETECTED
Candida albicans: NOT DETECTED
ESCHERICHIA COLI: DETECTED — AB
Enterobacter cloacae complex: NOT DETECTED
Enterobacteriaceae species: DETECTED — AB
Enterococcus species: NOT DETECTED
Haemophilus influenzae: NOT DETECTED
KLEBSIELLA OXYTOCA: NOT DETECTED
KLEBSIELLA PNEUMONIAE: NOT DETECTED
LISTERIA MONOCYTOGENES: NOT DETECTED
Neisseria meningitidis: NOT DETECTED
PROTEUS SPECIES: NOT DETECTED
Pseudomonas aeruginosa: NOT DETECTED
SERRATIA MARCESCENS: NOT DETECTED
STAPHYLOCOCCUS AUREUS BCID: NOT DETECTED
STAPHYLOCOCCUS SPECIES: NOT DETECTED
STREPTOCOCCUS PNEUMONIAE: NOT DETECTED
Streptococcus agalactiae: NOT DETECTED
Streptococcus pyogenes: NOT DETECTED
Streptococcus species: NOT DETECTED

## 2018-04-16 NOTE — ED Notes (Addendum)
04/16/18 0914 - Lab called positive blood cultures to ED Charge RN. Both of pt's blood cultures were positive for gram negative rods. Dr. Roderic Palau, [place] notified. Pt was transferred from [facility] to [facility] either last night or this morning. Pt's positive blood cultures were called to pt's nurse, Ander Purpura, RN at [facility].

## 2018-04-18 LAB — CULTURE, BLOOD (ROUTINE X 2)
SPECIAL REQUESTS: ADEQUATE
SPECIAL REQUESTS: ADEQUATE

## 2018-04-19 ENCOUNTER — Telehealth: Payer: Self-pay

## 2018-04-19 NOTE — Telephone Encounter (Signed)
Pt with + BC sent to [facility] per Salome Arnt Pharm D

## 2018-04-22 DIAGNOSIS — Z4781 Encounter for orthopedic aftercare following surgical amputation: Secondary | ICD-10-CM | POA: Diagnosis not present

## 2018-04-22 DIAGNOSIS — S24102S Unspecified injury at T2-T6 level of thoracic spinal cord, sequela: Secondary | ICD-10-CM | POA: Diagnosis not present

## 2018-04-22 DIAGNOSIS — G8220 Paraplegia, unspecified: Secondary | ICD-10-CM | POA: Diagnosis not present

## 2018-04-22 DIAGNOSIS — Z89611 Acquired absence of right leg above knee: Secondary | ICD-10-CM | POA: Diagnosis not present

## 2018-04-22 DIAGNOSIS — Z452 Encounter for adjustment and management of vascular access device: Secondary | ICD-10-CM | POA: Diagnosis not present

## 2018-04-24 DIAGNOSIS — Z452 Encounter for adjustment and management of vascular access device: Secondary | ICD-10-CM | POA: Diagnosis not present

## 2018-04-24 DIAGNOSIS — S24102S Unspecified injury at T2-T6 level of thoracic spinal cord, sequela: Secondary | ICD-10-CM | POA: Diagnosis not present

## 2018-04-24 DIAGNOSIS — Z89611 Acquired absence of right leg above knee: Secondary | ICD-10-CM | POA: Diagnosis not present

## 2018-04-24 DIAGNOSIS — G8220 Paraplegia, unspecified: Secondary | ICD-10-CM | POA: Diagnosis not present

## 2018-04-24 DIAGNOSIS — Z4781 Encounter for orthopedic aftercare following surgical amputation: Secondary | ICD-10-CM | POA: Diagnosis not present

## 2018-04-27 DIAGNOSIS — G8220 Paraplegia, unspecified: Secondary | ICD-10-CM | POA: Diagnosis not present

## 2018-04-27 DIAGNOSIS — Z792 Long term (current) use of antibiotics: Secondary | ICD-10-CM | POA: Diagnosis not present

## 2018-04-27 DIAGNOSIS — Z4781 Encounter for orthopedic aftercare following surgical amputation: Secondary | ICD-10-CM | POA: Diagnosis not present

## 2018-04-27 DIAGNOSIS — S24102S Unspecified injury at T2-T6 level of thoracic spinal cord, sequela: Secondary | ICD-10-CM | POA: Diagnosis not present

## 2018-04-27 DIAGNOSIS — Z452 Encounter for adjustment and management of vascular access device: Secondary | ICD-10-CM | POA: Diagnosis not present

## 2018-04-27 DIAGNOSIS — Z89611 Acquired absence of right leg above knee: Secondary | ICD-10-CM | POA: Diagnosis not present

## 2018-04-29 DIAGNOSIS — G8220 Paraplegia, unspecified: Secondary | ICD-10-CM | POA: Diagnosis not present

## 2018-04-29 DIAGNOSIS — Z4781 Encounter for orthopedic aftercare following surgical amputation: Secondary | ICD-10-CM | POA: Diagnosis not present

## 2018-04-29 DIAGNOSIS — S24102S Unspecified injury at T2-T6 level of thoracic spinal cord, sequela: Secondary | ICD-10-CM | POA: Diagnosis not present

## 2018-04-29 DIAGNOSIS — Z89611 Acquired absence of right leg above knee: Secondary | ICD-10-CM | POA: Diagnosis not present

## 2018-04-29 DIAGNOSIS — Z452 Encounter for adjustment and management of vascular access device: Secondary | ICD-10-CM | POA: Diagnosis not present

## 2018-05-01 DIAGNOSIS — G8220 Paraplegia, unspecified: Secondary | ICD-10-CM | POA: Diagnosis not present

## 2018-05-01 DIAGNOSIS — S24102S Unspecified injury at T2-T6 level of thoracic spinal cord, sequela: Secondary | ICD-10-CM | POA: Diagnosis not present

## 2018-05-01 DIAGNOSIS — Z4781 Encounter for orthopedic aftercare following surgical amputation: Secondary | ICD-10-CM | POA: Diagnosis not present

## 2018-05-01 DIAGNOSIS — Z452 Encounter for adjustment and management of vascular access device: Secondary | ICD-10-CM | POA: Diagnosis not present

## 2018-05-01 DIAGNOSIS — Z89611 Acquired absence of right leg above knee: Secondary | ICD-10-CM | POA: Diagnosis not present

## 2018-05-04 DIAGNOSIS — S24102S Unspecified injury at T2-T6 level of thoracic spinal cord, sequela: Secondary | ICD-10-CM | POA: Diagnosis not present

## 2018-05-04 DIAGNOSIS — Z4781 Encounter for orthopedic aftercare following surgical amputation: Secondary | ICD-10-CM | POA: Diagnosis not present

## 2018-05-04 DIAGNOSIS — G8220 Paraplegia, unspecified: Secondary | ICD-10-CM | POA: Diagnosis not present

## 2018-05-04 DIAGNOSIS — Z89611 Acquired absence of right leg above knee: Secondary | ICD-10-CM | POA: Diagnosis not present

## 2018-05-04 DIAGNOSIS — Z452 Encounter for adjustment and management of vascular access device: Secondary | ICD-10-CM | POA: Diagnosis not present

## 2018-05-06 DIAGNOSIS — Z89611 Acquired absence of right leg above knee: Secondary | ICD-10-CM | POA: Diagnosis not present

## 2018-05-06 DIAGNOSIS — Z4781 Encounter for orthopedic aftercare following surgical amputation: Secondary | ICD-10-CM | POA: Diagnosis not present

## 2018-05-06 DIAGNOSIS — N312 Flaccid neuropathic bladder, not elsewhere classified: Secondary | ICD-10-CM | POA: Diagnosis not present

## 2018-05-06 DIAGNOSIS — G8220 Paraplegia, unspecified: Secondary | ICD-10-CM | POA: Diagnosis not present

## 2018-05-06 DIAGNOSIS — S24102S Unspecified injury at T2-T6 level of thoracic spinal cord, sequela: Secondary | ICD-10-CM | POA: Diagnosis not present

## 2018-05-06 DIAGNOSIS — Z452 Encounter for adjustment and management of vascular access device: Secondary | ICD-10-CM | POA: Diagnosis not present

## 2018-05-07 ENCOUNTER — Encounter: Payer: Medicare Other | Admitting: Physical Medicine & Rehabilitation

## 2018-05-07 DIAGNOSIS — L89312 Pressure ulcer of right buttock, stage 2: Secondary | ICD-10-CM | POA: Diagnosis not present

## 2018-05-07 DIAGNOSIS — Z89611 Acquired absence of right leg above knee: Secondary | ICD-10-CM | POA: Diagnosis not present

## 2018-05-07 DIAGNOSIS — L97113 Non-pressure chronic ulcer of right thigh with necrosis of muscle: Secondary | ICD-10-CM | POA: Diagnosis not present

## 2018-05-08 DIAGNOSIS — Z452 Encounter for adjustment and management of vascular access device: Secondary | ICD-10-CM | POA: Diagnosis not present

## 2018-05-08 DIAGNOSIS — Z89611 Acquired absence of right leg above knee: Secondary | ICD-10-CM | POA: Diagnosis not present

## 2018-05-08 DIAGNOSIS — G8220 Paraplegia, unspecified: Secondary | ICD-10-CM | POA: Diagnosis not present

## 2018-05-08 DIAGNOSIS — S24102S Unspecified injury at T2-T6 level of thoracic spinal cord, sequela: Secondary | ICD-10-CM | POA: Diagnosis not present

## 2018-05-08 DIAGNOSIS — Z4781 Encounter for orthopedic aftercare following surgical amputation: Secondary | ICD-10-CM | POA: Diagnosis not present

## 2018-05-11 DIAGNOSIS — S24102S Unspecified injury at T2-T6 level of thoracic spinal cord, sequela: Secondary | ICD-10-CM | POA: Diagnosis not present

## 2018-05-11 DIAGNOSIS — Z452 Encounter for adjustment and management of vascular access device: Secondary | ICD-10-CM | POA: Diagnosis not present

## 2018-05-11 DIAGNOSIS — Z4781 Encounter for orthopedic aftercare following surgical amputation: Secondary | ICD-10-CM | POA: Diagnosis not present

## 2018-05-11 DIAGNOSIS — G8220 Paraplegia, unspecified: Secondary | ICD-10-CM | POA: Diagnosis not present

## 2018-05-11 DIAGNOSIS — Z89611 Acquired absence of right leg above knee: Secondary | ICD-10-CM | POA: Diagnosis not present

## 2018-05-13 ENCOUNTER — Encounter: Payer: Medicare Other | Attending: Physical Medicine & Rehabilitation | Admitting: Physical Medicine & Rehabilitation

## 2018-05-13 ENCOUNTER — Encounter: Payer: Self-pay | Admitting: Physical Medicine & Rehabilitation

## 2018-05-13 VITALS — BP 148/79 | HR 81

## 2018-05-13 DIAGNOSIS — Z89611 Acquired absence of right leg above knee: Secondary | ICD-10-CM | POA: Diagnosis not present

## 2018-05-13 DIAGNOSIS — G894 Chronic pain syndrome: Secondary | ICD-10-CM

## 2018-05-13 DIAGNOSIS — M79604 Pain in right leg: Secondary | ICD-10-CM | POA: Diagnosis not present

## 2018-05-13 DIAGNOSIS — F17210 Nicotine dependence, cigarettes, uncomplicated: Secondary | ICD-10-CM | POA: Insufficient documentation

## 2018-05-13 DIAGNOSIS — F1010 Alcohol abuse, uncomplicated: Secondary | ICD-10-CM | POA: Diagnosis not present

## 2018-05-13 DIAGNOSIS — G479 Sleep disorder, unspecified: Secondary | ICD-10-CM | POA: Diagnosis not present

## 2018-05-13 DIAGNOSIS — Z452 Encounter for adjustment and management of vascular access device: Secondary | ICD-10-CM | POA: Diagnosis not present

## 2018-05-13 DIAGNOSIS — Z87828 Personal history of other (healed) physical injury and trauma: Secondary | ICD-10-CM | POA: Diagnosis not present

## 2018-05-13 DIAGNOSIS — I10 Essential (primary) hypertension: Secondary | ICD-10-CM | POA: Diagnosis not present

## 2018-05-13 DIAGNOSIS — Z79899 Other long term (current) drug therapy: Secondary | ICD-10-CM | POA: Insufficient documentation

## 2018-05-13 DIAGNOSIS — R269 Unspecified abnormalities of gait and mobility: Secondary | ICD-10-CM | POA: Insufficient documentation

## 2018-05-13 DIAGNOSIS — S24102S Unspecified injury at T2-T6 level of thoracic spinal cord, sequela: Secondary | ICD-10-CM | POA: Diagnosis not present

## 2018-05-13 DIAGNOSIS — Z4781 Encounter for orthopedic aftercare following surgical amputation: Secondary | ICD-10-CM | POA: Diagnosis not present

## 2018-05-13 DIAGNOSIS — S34109S Unspecified injury to unspecified level of lumbar spinal cord, sequela: Secondary | ICD-10-CM | POA: Insufficient documentation

## 2018-05-13 DIAGNOSIS — Z8249 Family history of ischemic heart disease and other diseases of the circulatory system: Secondary | ICD-10-CM | POA: Insufficient documentation

## 2018-05-13 DIAGNOSIS — Z833 Family history of diabetes mellitus: Secondary | ICD-10-CM | POA: Insufficient documentation

## 2018-05-13 DIAGNOSIS — Z823 Family history of stroke: Secondary | ICD-10-CM | POA: Insufficient documentation

## 2018-05-13 DIAGNOSIS — G8220 Paraplegia, unspecified: Secondary | ICD-10-CM | POA: Insufficient documentation

## 2018-05-13 DIAGNOSIS — Z841 Family history of disorders of kidney and ureter: Secondary | ICD-10-CM | POA: Insufficient documentation

## 2018-05-13 DIAGNOSIS — Z720 Tobacco use: Secondary | ICD-10-CM | POA: Diagnosis not present
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
## 2018-05-14 DIAGNOSIS — Z89611 Acquired absence of right leg above knee: Secondary | ICD-10-CM | POA: Diagnosis not present

## 2018-05-14 DIAGNOSIS — L89312 Pressure ulcer of right buttock, stage 2: Secondary | ICD-10-CM | POA: Diagnosis not present

## 2018-05-14 DIAGNOSIS — L97113 Non-pressure chronic ulcer of right thigh with necrosis of muscle: Secondary | ICD-10-CM | POA: Diagnosis not present

## 2018-05-15 DIAGNOSIS — S24102S Unspecified injury at T2-T6 level of thoracic spinal cord, sequela: Secondary | ICD-10-CM | POA: Diagnosis not present

## 2018-05-15 DIAGNOSIS — G8220 Paraplegia, unspecified: Secondary | ICD-10-CM | POA: Diagnosis not present

## 2018-05-15 DIAGNOSIS — Z452 Encounter for adjustment and management of vascular access device: Secondary | ICD-10-CM | POA: Diagnosis not present

## 2018-05-15 DIAGNOSIS — Z89611 Acquired absence of right leg above knee: Secondary | ICD-10-CM | POA: Diagnosis not present

## 2018-05-15 DIAGNOSIS — Z4781 Encounter for orthopedic aftercare following surgical amputation: Secondary | ICD-10-CM | POA: Diagnosis not present

## 2018-05-18 ENCOUNTER — Ambulatory Visit (INDEPENDENT_AMBULATORY_CARE_PROVIDER_SITE_OTHER): Payer: Medicare Other | Admitting: Internal Medicine

## 2018-05-18 ENCOUNTER — Other Ambulatory Visit: Payer: Self-pay

## 2018-05-18 DIAGNOSIS — I10 Essential (primary) hypertension: Secondary | ICD-10-CM

## 2018-05-18 DIAGNOSIS — R7881 Bacteremia: Secondary | ICD-10-CM | POA: Diagnosis not present

## 2018-05-18 DIAGNOSIS — Z87891 Personal history of nicotine dependence: Secondary | ICD-10-CM | POA: Diagnosis not present

## 2018-05-18 DIAGNOSIS — Z5189 Encounter for other specified aftercare: Secondary | ICD-10-CM

## 2018-05-18 DIAGNOSIS — S24102S Unspecified injury at T2-T6 level of thoracic spinal cord, sequela: Secondary | ICD-10-CM | POA: Diagnosis not present

## 2018-05-18 DIAGNOSIS — Z4781 Encounter for orthopedic aftercare following surgical amputation: Secondary | ICD-10-CM | POA: Diagnosis not present

## 2018-05-18 DIAGNOSIS — Z89611 Acquired absence of right leg above knee: Secondary | ICD-10-CM | POA: Diagnosis not present

## 2018-05-18 DIAGNOSIS — G8220 Paraplegia, unspecified: Secondary | ICD-10-CM | POA: Diagnosis not present

## 2018-05-18 DIAGNOSIS — Z79899 Other long term (current) drug therapy: Secondary | ICD-10-CM | POA: Diagnosis not present

## 2018-05-18 DIAGNOSIS — B9620 Unspecified Escherichia coli [E. coli] as the cause of diseases classified elsewhere: Secondary | ICD-10-CM | POA: Diagnosis not present

## 2018-05-18 DIAGNOSIS — Z452 Encounter for adjustment and management of vascular access device: Secondary | ICD-10-CM | POA: Diagnosis not present

## 2018-05-18 MED ORDER — ZOLPIDEM TARTRATE ER 6.25 MG PO TBCR: 6.2500 mg | EXTENDED_RELEASE_TABLET | Freq: Every evening | ORAL | 0 refills | Status: DC | PRN

## 2018-05-18 NOTE — Progress Notes (Signed)
   CC: Hospital follow-up secondary to sepsis  HPI:  [name] is a 67 y.o. male with a past medical history listed below here today for follow up of his recent hospitalization from 04/16/2018 to 04/22/2018 for sepsis.  [name] initially presented to [facility] health with fevers and confusion.  He was found to have E. coli bacteremia and was transferred to [place] [facility] as he was hypotensive requiring levophed.  He was weaned off of pressors and transferred to the floor on 04/17/2018.  Blood cultures grew ESBL and he was treated with a course of meropenem that ended 05/01/2018.  He has been continued on a course of Cipro that he is currently taking.  He has follow-up scheduled with infectious disease.  Today, he has no complaints.  He reports his strength is slowly improving.  Past Medical History:  Diagnosis Date  . Allergy   . Arthritis   . C. difficile colitis 11/2008  . Decubitus ulcer 1999   excision of right ischial pressure sore with flap reconstruction done by Dr. Denese Killings 10/31/2008  . GERD (gastroesophageal reflux disease)   . Hyperlipidemia   . Hypertension   . Paraplegia ([place])    secondary to [place]  . Perineal abscess   . Substance abuse ([place])    alcohol   Review of Systems:   No chest pain or shortness of breath  Physical Exam:  Vitals:   05/18/18 1434  BP: 128/71  Pulse: 87  Temp: 97.6 F (36.4 C)  TempSrc: Oral  SpO2: 97%   GENERAL- alert, co-operative, appears as stated age, not in any distress. HEENT- Atraumatic, normocephalic CARDIAC- RRR, no murmurs, rubs or gallops. RESP- Moving equal volumes of air, and clear to auscultation bilaterally, no wheezes or crackles. ABDOMEN- Soft, nontender, bowel sounds present. SKIN- Warm, dry, No rash or lesion. PSYCH- Normal mood and affect, appropriate thought content and speech.   Assessment & Plan:   See Encounters Tab for problem based charting.  Patient discussed with  Dr. Evette Doffing

## 2018-05-18 NOTE — Patient Instructions (Signed)
Shawn [name],  I am glad you are doing better. Please follow up with your specialists.   Please follow up with [place] in 3 months.

## 2018-05-19 DIAGNOSIS — R7881 Bacteremia: Secondary | ICD-10-CM | POA: Insufficient documentation

## 2018-05-19 HISTORY — DX: Bacteremia: R78.81

## 2018-05-19 NOTE — Assessment & Plan Note (Signed)
BP Readings from Last 3 Encounters:  05/18/18 128/71  05/13/18 (!) 148/79  04/15/18 90/70    Lab Results  Component Value Date   NA 132 (L) 04/15/2018   K 2.7 (LL) 04/15/2018   CREATININE 1.00 04/15/2018   Blood pressure well controlled today.  Continue HCTZ 25 mg daily, amlodipine 10 mg daily, losartan 50 mg daily.

## 2018-05-19 NOTE — Assessment & Plan Note (Signed)
See HPI for full details  He has finished a course of IV antibiotics.  Currently on p.o. Cipro.  Follow-up with infectious disease scheduled.

## 2018-05-19 NOTE — Progress Notes (Signed)
Internal Medicine Clinic Attending  Case discussed with Dr. Boswell at the time of the visit.  We reviewed the resident's history and exam and pertinent patient test results.  I agree with the assessment, diagnosis, and plan of care documented in the resident's note.  

## 2018-05-20 DIAGNOSIS — Z4781 Encounter for orthopedic aftercare following surgical amputation: Secondary | ICD-10-CM | POA: Diagnosis not present

## 2018-05-20 DIAGNOSIS — Z89611 Acquired absence of right leg above knee: Secondary | ICD-10-CM | POA: Diagnosis not present

## 2018-05-20 DIAGNOSIS — G8220 Paraplegia, unspecified: Secondary | ICD-10-CM | POA: Diagnosis not present

## 2018-05-20 DIAGNOSIS — S24102S Unspecified injury at T2-T6 level of thoracic spinal cord, sequela: Secondary | ICD-10-CM | POA: Diagnosis not present

## 2018-05-20 DIAGNOSIS — Z452 Encounter for adjustment and management of vascular access device: Secondary | ICD-10-CM | POA: Diagnosis not present

## 2018-05-22 DIAGNOSIS — Z89611 Acquired absence of right leg above knee: Secondary | ICD-10-CM | POA: Diagnosis not present

## 2018-05-22 DIAGNOSIS — Z4781 Encounter for orthopedic aftercare following surgical amputation: Secondary | ICD-10-CM | POA: Diagnosis not present

## 2018-05-22 DIAGNOSIS — Z452 Encounter for adjustment and management of vascular access device: Secondary | ICD-10-CM | POA: Diagnosis not present

## 2018-05-22 DIAGNOSIS — S24102S Unspecified injury at T2-T6 level of thoracic spinal cord, sequela: Secondary | ICD-10-CM | POA: Diagnosis not present

## 2018-05-22 DIAGNOSIS — G8220 Paraplegia, unspecified: Secondary | ICD-10-CM | POA: Diagnosis not present

## 2018-05-24 DIAGNOSIS — Z87440 Personal history of urinary (tract) infections: Secondary | ICD-10-CM | POA: Diagnosis not present

## 2018-05-24 DIAGNOSIS — M869 Osteomyelitis, unspecified: Secondary | ICD-10-CM | POA: Diagnosis not present

## 2018-05-24 DIAGNOSIS — Z89611 Acquired absence of right leg above knee: Secondary | ICD-10-CM | POA: Diagnosis not present

## 2018-05-24 DIAGNOSIS — M792 Neuralgia and neuritis, unspecified: Secondary | ICD-10-CM | POA: Diagnosis not present

## 2018-05-24 DIAGNOSIS — K219 Gastro-esophageal reflux disease without esophagitis: Secondary | ICD-10-CM | POA: Diagnosis not present

## 2018-05-24 DIAGNOSIS — Z936 Other artificial openings of urinary tract status: Secondary | ICD-10-CM | POA: Diagnosis not present

## 2018-05-24 DIAGNOSIS — E46 Unspecified protein-calorie malnutrition: Secondary | ICD-10-CM | POA: Diagnosis not present

## 2018-05-24 DIAGNOSIS — T8789 Other complications of amputation stump: Secondary | ICD-10-CM | POA: Diagnosis not present

## 2018-05-24 DIAGNOSIS — L97113 Non-pressure chronic ulcer of right thigh with necrosis of muscle: Secondary | ICD-10-CM | POA: Diagnosis not present

## 2018-05-24 DIAGNOSIS — F17210 Nicotine dependence, cigarettes, uncomplicated: Secondary | ICD-10-CM | POA: Diagnosis not present

## 2018-05-24 DIAGNOSIS — Z993 Dependence on wheelchair: Secondary | ICD-10-CM | POA: Diagnosis not present

## 2018-05-24 DIAGNOSIS — X16XXXD Contact with hot heating appliances, radiators and pipes, subsequent encounter: Secondary | ICD-10-CM | POA: Diagnosis not present

## 2018-05-24 DIAGNOSIS — Z4800 Encounter for change or removal of nonsurgical wound dressing: Secondary | ICD-10-CM | POA: Diagnosis not present

## 2018-05-24 DIAGNOSIS — G8220 Paraplegia, unspecified: Secondary | ICD-10-CM | POA: Diagnosis not present

## 2018-05-24 DIAGNOSIS — S24102S Unspecified injury at T2-T6 level of thoracic spinal cord, sequela: Secondary | ICD-10-CM | POA: Diagnosis not present

## 2018-05-24 DIAGNOSIS — I10 Essential (primary) hypertension: Secondary | ICD-10-CM | POA: Diagnosis not present

## 2018-05-25 DIAGNOSIS — L97113 Non-pressure chronic ulcer of right thigh with necrosis of muscle: Secondary | ICD-10-CM | POA: Diagnosis not present

## 2018-05-25 DIAGNOSIS — M792 Neuralgia and neuritis, unspecified: Secondary | ICD-10-CM | POA: Diagnosis not present

## 2018-05-25 DIAGNOSIS — G8220 Paraplegia, unspecified: Secondary | ICD-10-CM | POA: Diagnosis not present

## 2018-05-25 DIAGNOSIS — M869 Osteomyelitis, unspecified: Secondary | ICD-10-CM | POA: Diagnosis not present

## 2018-05-25 DIAGNOSIS — T8789 Other complications of amputation stump: Secondary | ICD-10-CM | POA: Diagnosis not present

## 2018-05-25 DIAGNOSIS — S24102S Unspecified injury at T2-T6 level of thoracic spinal cord, sequela: Secondary | ICD-10-CM | POA: Diagnosis not present

## 2018-05-27 ENCOUNTER — Encounter: Payer: PRIVATE HEALTH INSURANCE | Admitting: Internal Medicine

## 2018-05-27 DIAGNOSIS — S24102S Unspecified injury at T2-T6 level of thoracic spinal cord, sequela: Secondary | ICD-10-CM | POA: Diagnosis not present

## 2018-05-27 DIAGNOSIS — T8789 Other complications of amputation stump: Secondary | ICD-10-CM | POA: Diagnosis not present

## 2018-05-27 DIAGNOSIS — G8220 Paraplegia, unspecified: Secondary | ICD-10-CM | POA: Diagnosis not present

## 2018-05-27 DIAGNOSIS — L97113 Non-pressure chronic ulcer of right thigh with necrosis of muscle: Secondary | ICD-10-CM | POA: Diagnosis not present

## 2018-05-27 DIAGNOSIS — M869 Osteomyelitis, unspecified: Secondary | ICD-10-CM | POA: Diagnosis not present

## 2018-05-27 DIAGNOSIS — M792 Neuralgia and neuritis, unspecified: Secondary | ICD-10-CM | POA: Diagnosis not present

## 2018-05-28 DIAGNOSIS — L89312 Pressure ulcer of right buttock, stage 2: Secondary | ICD-10-CM | POA: Diagnosis not present

## 2018-05-28 DIAGNOSIS — Z89611 Acquired absence of right leg above knee: Secondary | ICD-10-CM | POA: Diagnosis not present

## 2018-05-28 DIAGNOSIS — L97113 Non-pressure chronic ulcer of right thigh with necrosis of muscle: Secondary | ICD-10-CM | POA: Diagnosis not present

## 2018-05-28 DIAGNOSIS — M869 Osteomyelitis, unspecified: Secondary | ICD-10-CM | POA: Diagnosis not present

## 2018-05-28 DIAGNOSIS — G8220 Paraplegia, unspecified: Secondary | ICD-10-CM | POA: Diagnosis not present

## 2018-05-28 DIAGNOSIS — E46 Unspecified protein-calorie malnutrition: Secondary | ICD-10-CM | POA: Diagnosis not present

## 2018-05-29 DIAGNOSIS — T8789 Other complications of amputation stump: Secondary | ICD-10-CM | POA: Diagnosis not present

## 2018-05-29 DIAGNOSIS — S24102S Unspecified injury at T2-T6 level of thoracic spinal cord, sequela: Secondary | ICD-10-CM | POA: Diagnosis not present

## 2018-05-29 DIAGNOSIS — M869 Osteomyelitis, unspecified: Secondary | ICD-10-CM | POA: Diagnosis not present

## 2018-05-29 DIAGNOSIS — G8220 Paraplegia, unspecified: Secondary | ICD-10-CM | POA: Diagnosis not present

## 2018-05-29 DIAGNOSIS — L97113 Non-pressure chronic ulcer of right thigh with necrosis of muscle: Secondary | ICD-10-CM | POA: Diagnosis not present

## 2018-05-29 DIAGNOSIS — M792 Neuralgia and neuritis, unspecified: Secondary | ICD-10-CM | POA: Diagnosis not present

## 2018-06-01 DIAGNOSIS — T8789 Other complications of amputation stump: Secondary | ICD-10-CM | POA: Diagnosis not present

## 2018-06-01 DIAGNOSIS — G8220 Paraplegia, unspecified: Secondary | ICD-10-CM | POA: Diagnosis not present

## 2018-06-01 DIAGNOSIS — M869 Osteomyelitis, unspecified: Secondary | ICD-10-CM | POA: Diagnosis not present

## 2018-06-01 DIAGNOSIS — M792 Neuralgia and neuritis, unspecified: Secondary | ICD-10-CM | POA: Diagnosis not present

## 2018-06-01 DIAGNOSIS — L97113 Non-pressure chronic ulcer of right thigh with necrosis of muscle: Secondary | ICD-10-CM | POA: Diagnosis not present

## 2018-06-01 DIAGNOSIS — S24102S Unspecified injury at T2-T6 level of thoracic spinal cord, sequela: Secondary | ICD-10-CM | POA: Diagnosis not present

## 2018-06-03 DIAGNOSIS — G8220 Paraplegia, unspecified: Secondary | ICD-10-CM | POA: Diagnosis not present

## 2018-06-03 DIAGNOSIS — N312 Flaccid neuropathic bladder, not elsewhere classified: Secondary | ICD-10-CM | POA: Diagnosis not present

## 2018-06-03 DIAGNOSIS — T8789 Other complications of amputation stump: Secondary | ICD-10-CM | POA: Diagnosis not present

## 2018-06-03 DIAGNOSIS — L97113 Non-pressure chronic ulcer of right thigh with necrosis of muscle: Secondary | ICD-10-CM | POA: Diagnosis not present

## 2018-06-03 DIAGNOSIS — S24102S Unspecified injury at T2-T6 level of thoracic spinal cord, sequela: Secondary | ICD-10-CM | POA: Diagnosis not present

## 2018-06-03 DIAGNOSIS — M792 Neuralgia and neuritis, unspecified: Secondary | ICD-10-CM | POA: Diagnosis not present

## 2018-06-03 DIAGNOSIS — M869 Osteomyelitis, unspecified: Secondary | ICD-10-CM | POA: Diagnosis not present

## 2018-06-05 DIAGNOSIS — Z89611 Acquired absence of right leg above knee: Secondary | ICD-10-CM | POA: Diagnosis not present

## 2018-06-05 DIAGNOSIS — L89302 Pressure ulcer of unspecified buttock, stage 2: Secondary | ICD-10-CM | POA: Diagnosis not present

## 2018-06-05 DIAGNOSIS — L97113 Non-pressure chronic ulcer of right thigh with necrosis of muscle: Secondary | ICD-10-CM | POA: Diagnosis not present

## 2018-06-05 DIAGNOSIS — L97115 Non-pressure chronic ulcer of right thigh with muscle involvement without evidence of necrosis: Secondary | ICD-10-CM | POA: Diagnosis not present

## 2018-06-07 ENCOUNTER — Encounter: Payer: Self-pay | Admitting: *Deleted

## 2018-06-08 DIAGNOSIS — L97113 Non-pressure chronic ulcer of right thigh with necrosis of muscle: Secondary | ICD-10-CM | POA: Diagnosis not present

## 2018-06-08 DIAGNOSIS — M792 Neuralgia and neuritis, unspecified: Secondary | ICD-10-CM | POA: Diagnosis not present

## 2018-06-08 DIAGNOSIS — M869 Osteomyelitis, unspecified: Secondary | ICD-10-CM | POA: Diagnosis not present

## 2018-06-08 DIAGNOSIS — S24102S Unspecified injury at T2-T6 level of thoracic spinal cord, sequela: Secondary | ICD-10-CM | POA: Diagnosis not present

## 2018-06-08 DIAGNOSIS — G8220 Paraplegia, unspecified: Secondary | ICD-10-CM | POA: Diagnosis not present

## 2018-06-08 DIAGNOSIS — T8789 Other complications of amputation stump: Secondary | ICD-10-CM | POA: Diagnosis not present

## 2018-06-11 ENCOUNTER — Other Ambulatory Visit: Payer: Self-pay | Admitting: Physical Medicine & Rehabilitation

## 2018-06-17 DIAGNOSIS — L97113 Non-pressure chronic ulcer of right thigh with necrosis of muscle: Secondary | ICD-10-CM | POA: Diagnosis not present

## 2018-06-17 DIAGNOSIS — M869 Osteomyelitis, unspecified: Secondary | ICD-10-CM | POA: Diagnosis not present

## 2018-06-17 DIAGNOSIS — S24102S Unspecified injury at T2-T6 level of thoracic spinal cord, sequela: Secondary | ICD-10-CM | POA: Diagnosis not present

## 2018-06-17 DIAGNOSIS — G8220 Paraplegia, unspecified: Secondary | ICD-10-CM | POA: Diagnosis not present

## 2018-06-17 DIAGNOSIS — T8789 Other complications of amputation stump: Secondary | ICD-10-CM | POA: Diagnosis not present

## 2018-06-17 DIAGNOSIS — M792 Neuralgia and neuritis, unspecified: Secondary | ICD-10-CM | POA: Diagnosis not present

## 2018-06-19 DIAGNOSIS — L89312 Pressure ulcer of right buttock, stage 2: Secondary | ICD-10-CM | POA: Diagnosis not present

## 2018-06-19 DIAGNOSIS — L97113 Non-pressure chronic ulcer of right thigh with necrosis of muscle: Secondary | ICD-10-CM | POA: Diagnosis not present

## 2018-06-19 DIAGNOSIS — Z89611 Acquired absence of right leg above knee: Secondary | ICD-10-CM | POA: Diagnosis not present

## 2018-06-24 DIAGNOSIS — G8220 Paraplegia, unspecified: Secondary | ICD-10-CM | POA: Diagnosis not present

## 2018-06-24 DIAGNOSIS — L97113 Non-pressure chronic ulcer of right thigh with necrosis of muscle: Secondary | ICD-10-CM | POA: Diagnosis not present

## 2018-06-24 DIAGNOSIS — T8789 Other complications of amputation stump: Secondary | ICD-10-CM | POA: Diagnosis not present

## 2018-06-24 DIAGNOSIS — M792 Neuralgia and neuritis, unspecified: Secondary | ICD-10-CM | POA: Diagnosis not present

## 2018-06-24 DIAGNOSIS — S24102S Unspecified injury at T2-T6 level of thoracic spinal cord, sequela: Secondary | ICD-10-CM | POA: Diagnosis not present

## 2018-06-24 DIAGNOSIS — M869 Osteomyelitis, unspecified: Secondary | ICD-10-CM | POA: Diagnosis not present

## 2018-07-01 DIAGNOSIS — T8789 Other complications of amputation stump: Secondary | ICD-10-CM | POA: Diagnosis not present

## 2018-07-01 DIAGNOSIS — M792 Neuralgia and neuritis, unspecified: Secondary | ICD-10-CM | POA: Diagnosis not present

## 2018-07-01 DIAGNOSIS — M869 Osteomyelitis, unspecified: Secondary | ICD-10-CM | POA: Diagnosis not present

## 2018-07-01 DIAGNOSIS — G8220 Paraplegia, unspecified: Secondary | ICD-10-CM | POA: Diagnosis not present

## 2018-07-01 DIAGNOSIS — S24102S Unspecified injury at T2-T6 level of thoracic spinal cord, sequela: Secondary | ICD-10-CM | POA: Diagnosis not present

## 2018-07-01 DIAGNOSIS — L97113 Non-pressure chronic ulcer of right thigh with necrosis of muscle: Secondary | ICD-10-CM | POA: Diagnosis not present

## 2018-07-01 DIAGNOSIS — N312 Flaccid neuropathic bladder, not elsewhere classified: Secondary | ICD-10-CM | POA: Diagnosis not present

## 2018-07-03 DIAGNOSIS — L97113 Non-pressure chronic ulcer of right thigh with necrosis of muscle: Secondary | ICD-10-CM | POA: Diagnosis not present

## 2018-07-03 DIAGNOSIS — Z89611 Acquired absence of right leg above knee: Secondary | ICD-10-CM | POA: Diagnosis not present

## 2018-07-03 DIAGNOSIS — L89312 Pressure ulcer of right buttock, stage 2: Secondary | ICD-10-CM | POA: Diagnosis not present

## 2018-07-08 DIAGNOSIS — S24102S Unspecified injury at T2-T6 level of thoracic spinal cord, sequela: Secondary | ICD-10-CM | POA: Diagnosis not present

## 2018-07-08 DIAGNOSIS — G8220 Paraplegia, unspecified: Secondary | ICD-10-CM | POA: Diagnosis not present

## 2018-07-08 DIAGNOSIS — L97113 Non-pressure chronic ulcer of right thigh with necrosis of muscle: Secondary | ICD-10-CM | POA: Diagnosis not present

## 2018-07-08 DIAGNOSIS — T8789 Other complications of amputation stump: Secondary | ICD-10-CM | POA: Diagnosis not present

## 2018-07-08 DIAGNOSIS — M869 Osteomyelitis, unspecified: Secondary | ICD-10-CM | POA: Diagnosis not present

## 2018-07-08 DIAGNOSIS — M792 Neuralgia and neuritis, unspecified: Secondary | ICD-10-CM | POA: Diagnosis not present

## 2018-07-14 DIAGNOSIS — G8220 Paraplegia, unspecified: Secondary | ICD-10-CM | POA: Diagnosis not present

## 2018-07-14 DIAGNOSIS — S24102S Unspecified injury at T2-T6 level of thoracic spinal cord, sequela: Secondary | ICD-10-CM | POA: Diagnosis not present

## 2018-07-14 DIAGNOSIS — M792 Neuralgia and neuritis, unspecified: Secondary | ICD-10-CM | POA: Diagnosis not present

## 2018-07-14 DIAGNOSIS — M869 Osteomyelitis, unspecified: Secondary | ICD-10-CM | POA: Diagnosis not present

## 2018-07-14 DIAGNOSIS — T8789 Other complications of amputation stump: Secondary | ICD-10-CM | POA: Diagnosis not present

## 2018-07-14 DIAGNOSIS — L97113 Non-pressure chronic ulcer of right thigh with necrosis of muscle: Secondary | ICD-10-CM | POA: Diagnosis not present

## 2018-07-21 DIAGNOSIS — S24102S Unspecified injury at T2-T6 level of thoracic spinal cord, sequela: Secondary | ICD-10-CM | POA: Diagnosis not present

## 2018-07-21 DIAGNOSIS — M792 Neuralgia and neuritis, unspecified: Secondary | ICD-10-CM | POA: Diagnosis not present

## 2018-07-21 DIAGNOSIS — T8789 Other complications of amputation stump: Secondary | ICD-10-CM | POA: Diagnosis not present

## 2018-07-21 DIAGNOSIS — G8220 Paraplegia, unspecified: Secondary | ICD-10-CM | POA: Diagnosis not present

## 2018-07-21 DIAGNOSIS — M869 Osteomyelitis, unspecified: Secondary | ICD-10-CM | POA: Diagnosis not present

## 2018-07-21 DIAGNOSIS — L97113 Non-pressure chronic ulcer of right thigh with necrosis of muscle: Secondary | ICD-10-CM | POA: Diagnosis not present

## 2018-07-23 ENCOUNTER — Telehealth: Payer: Self-pay | Admitting: *Deleted

## 2018-07-23 DIAGNOSIS — L97113 Non-pressure chronic ulcer of right thigh with necrosis of muscle: Secondary | ICD-10-CM | POA: Diagnosis not present

## 2018-07-23 DIAGNOSIS — G8220 Paraplegia, unspecified: Secondary | ICD-10-CM | POA: Diagnosis not present

## 2018-07-23 DIAGNOSIS — Z936 Other artificial openings of urinary tract status: Secondary | ICD-10-CM | POA: Diagnosis not present

## 2018-07-23 DIAGNOSIS — X16XXXD Contact with hot heating appliances, radiators and pipes, subsequent encounter: Secondary | ICD-10-CM | POA: Diagnosis not present

## 2018-07-23 DIAGNOSIS — M792 Neuralgia and neuritis, unspecified: Secondary | ICD-10-CM | POA: Diagnosis not present

## 2018-07-23 DIAGNOSIS — E46 Unspecified protein-calorie malnutrition: Secondary | ICD-10-CM | POA: Diagnosis not present

## 2018-07-23 DIAGNOSIS — Z87440 Personal history of urinary (tract) infections: Secondary | ICD-10-CM | POA: Diagnosis not present

## 2018-07-23 DIAGNOSIS — I10 Essential (primary) hypertension: Secondary | ICD-10-CM | POA: Diagnosis not present

## 2018-07-23 DIAGNOSIS — K219 Gastro-esophageal reflux disease without esophagitis: Secondary | ICD-10-CM | POA: Diagnosis not present

## 2018-07-23 DIAGNOSIS — F17210 Nicotine dependence, cigarettes, uncomplicated: Secondary | ICD-10-CM | POA: Diagnosis not present

## 2018-07-23 DIAGNOSIS — Z993 Dependence on wheelchair: Secondary | ICD-10-CM | POA: Diagnosis not present

## 2018-07-23 DIAGNOSIS — Z4800 Encounter for change or removal of nonsurgical wound dressing: Secondary | ICD-10-CM | POA: Diagnosis not present

## 2018-07-23 DIAGNOSIS — Z89611 Acquired absence of right leg above knee: Secondary | ICD-10-CM | POA: Diagnosis not present

## 2018-07-23 DIAGNOSIS — M869 Osteomyelitis, unspecified: Secondary | ICD-10-CM | POA: Diagnosis not present

## 2018-07-23 DIAGNOSIS — S24102S Unspecified injury at T2-T6 level of thoracic spinal cord, sequela: Secondary | ICD-10-CM | POA: Diagnosis not present

## 2018-07-23 DIAGNOSIS — T8789 Other complications of amputation stump: Secondary | ICD-10-CM | POA: Diagnosis not present

## 2018-07-23 DIAGNOSIS — L89312 Pressure ulcer of right buttock, stage 2: Secondary | ICD-10-CM | POA: Diagnosis not present

## 2018-07-23 NOTE — Telephone Encounter (Signed)
[name] calls for recert VO, 2x week for 2 weeks, 1x week for 6 weeks for wound care. Do you agree?

## 2018-07-23 NOTE — Telephone Encounter (Signed)
That's fine.  Thank you.

## 2018-07-24 DIAGNOSIS — E46 Unspecified protein-calorie malnutrition: Secondary | ICD-10-CM | POA: Diagnosis not present

## 2018-07-24 DIAGNOSIS — L97113 Non-pressure chronic ulcer of right thigh with necrosis of muscle: Secondary | ICD-10-CM | POA: Diagnosis not present

## 2018-07-24 DIAGNOSIS — Z89611 Acquired absence of right leg above knee: Secondary | ICD-10-CM | POA: Diagnosis not present

## 2018-07-24 DIAGNOSIS — L89312 Pressure ulcer of right buttock, stage 2: Secondary | ICD-10-CM | POA: Diagnosis not present

## 2018-07-28 DIAGNOSIS — L97113 Non-pressure chronic ulcer of right thigh with necrosis of muscle: Secondary | ICD-10-CM | POA: Diagnosis not present

## 2018-07-28 DIAGNOSIS — M869 Osteomyelitis, unspecified: Secondary | ICD-10-CM | POA: Diagnosis not present

## 2018-07-28 DIAGNOSIS — L89312 Pressure ulcer of right buttock, stage 2: Secondary | ICD-10-CM | POA: Diagnosis not present

## 2018-07-28 DIAGNOSIS — T8789 Other complications of amputation stump: Secondary | ICD-10-CM | POA: Diagnosis not present

## 2018-07-28 DIAGNOSIS — G8220 Paraplegia, unspecified: Secondary | ICD-10-CM | POA: Diagnosis not present

## 2018-07-28 DIAGNOSIS — M792 Neuralgia and neuritis, unspecified: Secondary | ICD-10-CM | POA: Diagnosis not present

## 2018-07-30 DIAGNOSIS — N312 Flaccid neuropathic bladder, not elsewhere classified: Secondary | ICD-10-CM | POA: Diagnosis not present

## 2018-08-04 DIAGNOSIS — G8220 Paraplegia, unspecified: Secondary | ICD-10-CM | POA: Diagnosis not present

## 2018-08-04 DIAGNOSIS — M792 Neuralgia and neuritis, unspecified: Secondary | ICD-10-CM | POA: Diagnosis not present

## 2018-08-04 DIAGNOSIS — T8789 Other complications of amputation stump: Secondary | ICD-10-CM | POA: Diagnosis not present

## 2018-08-04 DIAGNOSIS — L97113 Non-pressure chronic ulcer of right thigh with necrosis of muscle: Secondary | ICD-10-CM | POA: Diagnosis not present

## 2018-08-04 DIAGNOSIS — L89312 Pressure ulcer of right buttock, stage 2: Secondary | ICD-10-CM | POA: Diagnosis not present

## 2018-08-04 DIAGNOSIS — M869 Osteomyelitis, unspecified: Secondary | ICD-10-CM | POA: Diagnosis not present

## 2018-08-07 DIAGNOSIS — L89313 Pressure ulcer of right buttock, stage 3: Secondary | ICD-10-CM | POA: Diagnosis not present

## 2018-08-07 DIAGNOSIS — L89312 Pressure ulcer of right buttock, stage 2: Secondary | ICD-10-CM | POA: Diagnosis not present

## 2018-08-07 DIAGNOSIS — L97113 Non-pressure chronic ulcer of right thigh with necrosis of muscle: Secondary | ICD-10-CM | POA: Diagnosis not present

## 2018-08-07 DIAGNOSIS — L89890 Pressure ulcer of other site, unstageable: Secondary | ICD-10-CM | POA: Diagnosis not present

## 2018-08-07 DIAGNOSIS — Z89611 Acquired absence of right leg above knee: Secondary | ICD-10-CM | POA: Diagnosis not present

## 2018-08-07 DIAGNOSIS — L89894 Pressure ulcer of other site, stage 4: Secondary | ICD-10-CM | POA: Diagnosis not present

## 2018-08-11 DIAGNOSIS — L97113 Non-pressure chronic ulcer of right thigh with necrosis of muscle: Secondary | ICD-10-CM | POA: Diagnosis not present

## 2018-08-11 DIAGNOSIS — L89312 Pressure ulcer of right buttock, stage 2: Secondary | ICD-10-CM | POA: Diagnosis not present

## 2018-08-11 DIAGNOSIS — G8220 Paraplegia, unspecified: Secondary | ICD-10-CM | POA: Diagnosis not present

## 2018-08-11 DIAGNOSIS — M792 Neuralgia and neuritis, unspecified: Secondary | ICD-10-CM | POA: Diagnosis not present

## 2018-08-11 DIAGNOSIS — T8789 Other complications of amputation stump: Secondary | ICD-10-CM | POA: Diagnosis not present

## 2018-08-11 DIAGNOSIS — M869 Osteomyelitis, unspecified: Secondary | ICD-10-CM | POA: Diagnosis not present

## 2018-08-13 ENCOUNTER — Encounter: Payer: Medicare Other | Attending: Physical Medicine & Rehabilitation | Admitting: Physical Medicine & Rehabilitation

## 2018-08-13 ENCOUNTER — Encounter: Payer: Self-pay | Admitting: Physical Medicine & Rehabilitation

## 2018-08-13 ENCOUNTER — Other Ambulatory Visit: Payer: Self-pay

## 2018-08-13 VITALS — BP 116/76 | HR 91

## 2018-08-13 DIAGNOSIS — I10 Essential (primary) hypertension: Secondary | ICD-10-CM | POA: Diagnosis not present

## 2018-08-13 DIAGNOSIS — Z87828 Personal history of other (healed) physical injury and trauma: Secondary | ICD-10-CM | POA: Diagnosis not present

## 2018-08-13 DIAGNOSIS — Z79899 Other long term (current) drug therapy: Secondary | ICD-10-CM | POA: Diagnosis not present

## 2018-08-13 DIAGNOSIS — Z79891 Long term (current) use of opiate analgesic: Secondary | ICD-10-CM | POA: Diagnosis not present

## 2018-08-13 DIAGNOSIS — R269 Unspecified abnormalities of gait and mobility: Secondary | ICD-10-CM | POA: Diagnosis not present

## 2018-08-13 DIAGNOSIS — Z89611 Acquired absence of right leg above knee: Secondary | ICD-10-CM | POA: Diagnosis not present

## 2018-08-13 DIAGNOSIS — Z823 Family history of stroke: Secondary | ICD-10-CM | POA: Insufficient documentation

## 2018-08-13 DIAGNOSIS — M79604 Pain in right leg: Secondary | ICD-10-CM | POA: Diagnosis not present

## 2018-08-13 DIAGNOSIS — Z720 Tobacco use: Secondary | ICD-10-CM

## 2018-08-13 DIAGNOSIS — F1010 Alcohol abuse, uncomplicated: Secondary | ICD-10-CM | POA: Diagnosis not present

## 2018-08-13 DIAGNOSIS — G8220 Paraplegia, unspecified: Secondary | ICD-10-CM | POA: Diagnosis not present

## 2018-08-13 DIAGNOSIS — Z8249 Family history of ischemic heart disease and other diseases of the circulatory system: Secondary | ICD-10-CM | POA: Diagnosis not present

## 2018-08-13 DIAGNOSIS — G894 Chronic pain syndrome: Secondary | ICD-10-CM

## 2018-08-13 DIAGNOSIS — Z841 Family history of disorders of kidney and ureter: Secondary | ICD-10-CM | POA: Diagnosis not present

## 2018-08-13 DIAGNOSIS — Z833 Family history of diabetes mellitus: Secondary | ICD-10-CM | POA: Diagnosis not present

## 2018-08-13 DIAGNOSIS — G479 Sleep disorder, unspecified: Secondary | ICD-10-CM | POA: Insufficient documentation

## 2018-08-13 DIAGNOSIS — S34109S Unspecified injury to unspecified level of lumbar spinal cord, sequela: Secondary | ICD-10-CM | POA: Diagnosis not present

## 2018-08-13 DIAGNOSIS — F17210 Nicotine dependence, cigarettes, uncomplicated: Secondary | ICD-10-CM | POA: Diagnosis not present

## 2018-08-13 DIAGNOSIS — Z5181 Encounter for therapeutic drug level monitoring: Secondary | ICD-10-CM

## 2018-08-13 MED ORDER — TRAMADOL HCL 50 MG PO TABS: 50.0000 mg | ORAL_TABLET | Freq: Three times a day (TID) | ORAL | 2 refills | Status: DC

## 2018-08-13 MED ORDER — GABAPENTIN 300 MG PO CAPS: 300.0000 mg | ORAL_CAPSULE | Freq: Every day | ORAL | 1 refills | Status: DC
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
## 2018-08-18 DIAGNOSIS — T8789 Other complications of amputation stump: Secondary | ICD-10-CM | POA: Diagnosis not present

## 2018-08-18 DIAGNOSIS — L89312 Pressure ulcer of right buttock, stage 2: Secondary | ICD-10-CM | POA: Diagnosis not present

## 2018-08-18 DIAGNOSIS — G8220 Paraplegia, unspecified: Secondary | ICD-10-CM | POA: Diagnosis not present

## 2018-08-18 DIAGNOSIS — L97113 Non-pressure chronic ulcer of right thigh with necrosis of muscle: Secondary | ICD-10-CM | POA: Diagnosis not present

## 2018-08-18 DIAGNOSIS — M869 Osteomyelitis, unspecified: Secondary | ICD-10-CM | POA: Diagnosis not present

## 2018-08-18 DIAGNOSIS — M792 Neuralgia and neuritis, unspecified: Secondary | ICD-10-CM | POA: Diagnosis not present

## 2018-08-19 DIAGNOSIS — L089 Local infection of the skin and subcutaneous tissue, unspecified: Secondary | ICD-10-CM | POA: Diagnosis not present

## 2018-08-19 DIAGNOSIS — T148XXA Other injury of unspecified body region, initial encounter: Secondary | ICD-10-CM | POA: Diagnosis not present

## 2018-08-19 DIAGNOSIS — L97113 Non-pressure chronic ulcer of right thigh with necrosis of muscle: Secondary | ICD-10-CM | POA: Diagnosis not present

## 2018-08-19 DIAGNOSIS — L89893 Pressure ulcer of other site, stage 3: Secondary | ICD-10-CM | POA: Diagnosis not present

## 2018-08-19 DIAGNOSIS — Z89611 Acquired absence of right leg above knee: Secondary | ICD-10-CM | POA: Diagnosis not present

## 2018-08-19 LAB — DRUG TOX MONITOR 1 W/CONF, ORAL FLD
AMPHETAMINES: NEGATIVE ng/mL (ref <10)
BENZODIAZEPINES: NEGATIVE ng/mL (ref <0.50)
BUPRENORPHINE: NEGATIVE ng/mL (ref <0.10)
Barbiturates: NEGATIVE ng/mL (ref <10)
COTININE: 142.9 ng/mL — AB (ref <5.0)
Cocaine: NEGATIVE ng/mL (ref <5.0)
FENTANYL: NEGATIVE ng/mL (ref <0.10)
HEROIN METABOLITE: NEGATIVE ng/mL (ref <1.0)
MARIJUANA: NEGATIVE ng/mL (ref <2.5)
MDMA: NEGATIVE ng/mL (ref <10)
MEPROBAMATE: NEGATIVE ng/mL (ref <2.5)
METHADONE: NEGATIVE ng/mL (ref <5.0)
Nicotine Metabolite: POSITIVE ng/mL — AB (ref <5.0)
OPIATES: NEGATIVE ng/mL (ref <2.5)
Phencyclidine: NEGATIVE ng/mL (ref <10)
TRAMADOL: POSITIVE ng/mL — AB (ref <5.0)
Tapentadol: NEGATIVE ng/mL (ref <5.0)
Tramadol: >500 ng/mL — ABNORMAL HIGH (ref <5.0)
Zolpidem: NEGATIVE ng/mL (ref <5.0)

## 2018-08-19 LAB — DRUG TOX ALC METAB W/CON, ORAL FLD: Alcohol Metabolite: NEGATIVE ng/mL (ref <25)

## 2018-08-20 ENCOUNTER — Telehealth: Payer: Self-pay | Admitting: *Deleted

## 2018-08-20 NOTE — Telephone Encounter (Signed)
Oral swab drug screen was consistent for prescribed medications.  ?

## 2018-08-26 DIAGNOSIS — N312 Flaccid neuropathic bladder, not elsewhere classified: Secondary | ICD-10-CM | POA: Diagnosis not present

## 2018-08-26 DIAGNOSIS — L97113 Non-pressure chronic ulcer of right thigh with necrosis of muscle: Secondary | ICD-10-CM | POA: Diagnosis not present

## 2018-08-26 DIAGNOSIS — M869 Osteomyelitis, unspecified: Secondary | ICD-10-CM | POA: Diagnosis not present

## 2018-08-26 DIAGNOSIS — T8789 Other complications of amputation stump: Secondary | ICD-10-CM | POA: Diagnosis not present

## 2018-08-26 DIAGNOSIS — G8220 Paraplegia, unspecified: Secondary | ICD-10-CM | POA: Diagnosis not present

## 2018-08-26 DIAGNOSIS — L89312 Pressure ulcer of right buttock, stage 2: Secondary | ICD-10-CM | POA: Diagnosis not present

## 2018-08-26 DIAGNOSIS — M792 Neuralgia and neuritis, unspecified: Secondary | ICD-10-CM | POA: Diagnosis not present

## 2018-08-27 ENCOUNTER — Ambulatory Visit (INDEPENDENT_AMBULATORY_CARE_PROVIDER_SITE_OTHER): Payer: Medicare Other | Admitting: Internal Medicine

## 2018-08-27 ENCOUNTER — Encounter: Payer: Self-pay | Admitting: Internal Medicine

## 2018-08-27 ENCOUNTER — Other Ambulatory Visit: Payer: Self-pay

## 2018-08-27 VITALS — BP 152/99 | HR 96 | Temp 98.5°F | Wt 180.0 lb

## 2018-08-27 DIAGNOSIS — Z89611 Acquired absence of right leg above knee: Secondary | ICD-10-CM

## 2018-08-27 DIAGNOSIS — F17211 Nicotine dependence, cigarettes, in remission: Secondary | ICD-10-CM

## 2018-08-27 DIAGNOSIS — Z792 Long term (current) use of antibiotics: Secondary | ICD-10-CM | POA: Diagnosis not present

## 2018-08-27 DIAGNOSIS — Z79899 Other long term (current) drug therapy: Secondary | ICD-10-CM | POA: Diagnosis not present

## 2018-08-27 DIAGNOSIS — G8220 Paraplegia, unspecified: Secondary | ICD-10-CM

## 2018-08-27 DIAGNOSIS — E876 Hypokalemia: Secondary | ICD-10-CM | POA: Diagnosis not present

## 2018-08-27 DIAGNOSIS — I10 Essential (primary) hypertension: Secondary | ICD-10-CM | POA: Diagnosis not present

## 2018-08-27 DIAGNOSIS — Z23 Encounter for immunization: Secondary | ICD-10-CM

## 2018-08-27 DIAGNOSIS — Z8619 Personal history of other infectious and parasitic diseases: Secondary | ICD-10-CM

## 2018-08-27 DIAGNOSIS — Z87828 Personal history of other (healed) physical injury and trauma: Secondary | ICD-10-CM

## 2018-08-27 DIAGNOSIS — Z993 Dependence on wheelchair: Secondary | ICD-10-CM

## 2018-08-27 DIAGNOSIS — F5102 Adjustment insomnia: Secondary | ICD-10-CM | POA: Diagnosis not present

## 2018-08-27 MED ORDER — ZOLPIDEM TARTRATE 5 MG PO TABS: 5.0000 mg | ORAL_TABLET | Freq: Every evening | ORAL | 0 refills | Status: DC | PRN

## 2018-08-27 NOTE — Progress Notes (Signed)
   CC: Insomnia  HPI:  [name] is a 67 y.o. male who presented to the clinic for continued evaluation and management of his chronic medical illnesses. For a detailed assessment and plan please refer to problem based charting below.   Past Medical History:  Diagnosis Date  . Allergy   . Arthritis   . C. difficile colitis 11/2008  . Decubitus ulcer 1999   excision of right ischial pressure sore with flap reconstruction done by Dr. Denese Killings 10/31/2008  . GERD (gastroesophageal reflux disease)   . Hyperlipidemia   . Hypertension   . Paraplegia ([place])    secondary to [place]  . Perineal abscess   . Substance abuse ([place])    alcohol   Review of Systems:  12 point ROS preformed. All negative aside from those mentioned in the HPI.  Physical Exam: Vitals:   08/27/18 1601 08/27/18 1655  BP: (!) 143/103 (!) 152/99  Pulse: 100 96  Temp: 98.5 F (36.9 C)   SpO2: 97%   Weight: 180 lb (81.6 kg)    General: Well nourished male in no acute distress Pulm: Good air movement with no wheezing or crackles  CV: RRR, no murmurs, no rubs  Abdomen: Soft, non-distended, no tenderness to palpation  Extremities: S/p right above the knee amputation, no LE edema on the left  Assessment & Plan:   See Encounters Tab for problem based charting.  Patient discussed with Dr. Dareen Piano

## 2018-08-27 NOTE — Patient Instructions (Signed)
Thank you for allowing [place] to provide your care. Your most recent blood work did show that your potassium was still a little low, so we will need to check it today. I have sent out a new prescription of Ambien. This one is not the extended release so it should not make he feels drowsy days after taking it. I will see you back in three months or sooner if anything arises.

## 2018-08-28 ENCOUNTER — Encounter: Payer: Self-pay | Admitting: Internal Medicine

## 2018-08-28 LAB — BMP8+ANION GAP
ANION GAP: 16 mmol/L (ref 10.0–18.0)
BUN / CREAT RATIO: 12 (ref 10–24)
BUN: 8 mg/dL (ref 8–27)
CO2: 28 mmol/L (ref 20–29)
CREATININE: 0.67 mg/dL — AB (ref 0.76–1.27)
Calcium: 9.2 mg/dL (ref 8.6–10.2)
Chloride: 92 mmol/L — ABNORMAL LOW (ref 96–106)
GFR calc Af Amer: 114 mL/min/{1.73_m2} (ref >59)
GFR, EST NON AFRICAN AMERICAN: 99 mL/min/{1.73_m2} (ref >59)
Glucose: 111 mg/dL — ABNORMAL HIGH (ref 65–99)
POTASSIUM: 3 mmol/L — AB (ref 3.5–5.2)
SODIUM: 136 mmol/L (ref 134–144)
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
## 2018-08-28 NOTE — Assessment & Plan Note (Signed)
Patient continues to have difficulty with sleep initiation and maintenance. We discussed healthy sleep hygiene. He states that he does take the Ambien on occasion; however, he does not like to use it often because it often makes him drowsy for 1 to 2 days after taking it. Review of records indicates that he was started on the extended release Ambien 6.25 mg QHS PRN. This is likely contributing to his prolonged somnolence after use. We will switch this to immediate release Ambien 5 mg QHS PRN.

## 2018-08-28 NOTE — Assessment & Plan Note (Addendum)
Patient presents for continued evaluation of his hypertension. His blood pressure is above goal of 130/80 today. He is unsure why. States that the majority of his doctors appointments his blood pressure is usually less than 130/80. Feels like it might be elevated from wheeling himself into the office today. He is continuing to take his amlodipine 10 mg daily, losartan 50 mg daily, and hydrochlorothiazide 25 mg daily. He is not experiencing any adverse effects from these medications. Review of records indicates that his most recent chemistry panel from [facility] was significant for hypokalemia, and that his blood pressure is usually well controlled. Therefore today we will recheck a basic metabolic panel and continue all medications as prescribed. If at the next visit his blood pressure is elevated we will make adjustments to his medication management.  Addendum: patient's BMP return with continued hypokalemia. We will continue his potassium chloride 40 mEq daily. If his blood pressure is elevated at the next visit I would not increase his hydrochlorothiazide as this would likely make his hypokalemia worse.

## 2018-08-31 NOTE — Progress Notes (Signed)
Internal Medicine Clinic Attending  Case discussed with Dr. Helberg at the time of the visit.  We reviewed the resident's history and exam and pertinent patient test results.  I agree with the assessment, diagnosis, and plan of care documented in the resident's note.    

## 2018-09-02 DIAGNOSIS — M792 Neuralgia and neuritis, unspecified: Secondary | ICD-10-CM | POA: Diagnosis not present

## 2018-09-02 DIAGNOSIS — T8789 Other complications of amputation stump: Secondary | ICD-10-CM | POA: Diagnosis not present

## 2018-09-02 DIAGNOSIS — G8220 Paraplegia, unspecified: Secondary | ICD-10-CM | POA: Diagnosis not present

## 2018-09-02 DIAGNOSIS — M869 Osteomyelitis, unspecified: Secondary | ICD-10-CM | POA: Diagnosis not present

## 2018-09-02 DIAGNOSIS — L97113 Non-pressure chronic ulcer of right thigh with necrosis of muscle: Secondary | ICD-10-CM | POA: Diagnosis not present

## 2018-09-02 DIAGNOSIS — L89312 Pressure ulcer of right buttock, stage 2: Secondary | ICD-10-CM | POA: Diagnosis not present

## 2018-09-04 DIAGNOSIS — L89890 Pressure ulcer of other site, unstageable: Secondary | ICD-10-CM | POA: Diagnosis not present

## 2018-09-04 DIAGNOSIS — Z89611 Acquired absence of right leg above knee: Secondary | ICD-10-CM | POA: Diagnosis not present

## 2018-09-04 DIAGNOSIS — G8220 Paraplegia, unspecified: Secondary | ICD-10-CM | POA: Diagnosis not present

## 2018-09-04 DIAGNOSIS — L89313 Pressure ulcer of right buttock, stage 3: Secondary | ICD-10-CM | POA: Diagnosis not present

## 2018-09-04 DIAGNOSIS — L97113 Non-pressure chronic ulcer of right thigh with necrosis of muscle: Secondary | ICD-10-CM | POA: Diagnosis not present

## 2018-09-04 DIAGNOSIS — L308 Other specified dermatitis: Secondary | ICD-10-CM | POA: Diagnosis not present

## 2018-09-09 DIAGNOSIS — L97113 Non-pressure chronic ulcer of right thigh with necrosis of muscle: Secondary | ICD-10-CM | POA: Diagnosis not present

## 2018-09-09 DIAGNOSIS — T8789 Other complications of amputation stump: Secondary | ICD-10-CM | POA: Diagnosis not present

## 2018-09-09 DIAGNOSIS — G8220 Paraplegia, unspecified: Secondary | ICD-10-CM | POA: Diagnosis not present

## 2018-09-09 DIAGNOSIS — M792 Neuralgia and neuritis, unspecified: Secondary | ICD-10-CM | POA: Diagnosis not present

## 2018-09-09 DIAGNOSIS — L89312 Pressure ulcer of right buttock, stage 2: Secondary | ICD-10-CM | POA: Diagnosis not present

## 2018-09-09 DIAGNOSIS — M869 Osteomyelitis, unspecified: Secondary | ICD-10-CM | POA: Diagnosis not present

## 2018-09-16 DIAGNOSIS — M792 Neuralgia and neuritis, unspecified: Secondary | ICD-10-CM | POA: Diagnosis not present

## 2018-09-16 DIAGNOSIS — T8789 Other complications of amputation stump: Secondary | ICD-10-CM | POA: Diagnosis not present

## 2018-09-16 DIAGNOSIS — M869 Osteomyelitis, unspecified: Secondary | ICD-10-CM | POA: Diagnosis not present

## 2018-09-16 DIAGNOSIS — L97113 Non-pressure chronic ulcer of right thigh with necrosis of muscle: Secondary | ICD-10-CM | POA: Diagnosis not present

## 2018-09-16 DIAGNOSIS — G8220 Paraplegia, unspecified: Secondary | ICD-10-CM | POA: Diagnosis not present

## 2018-09-16 DIAGNOSIS — L89312 Pressure ulcer of right buttock, stage 2: Secondary | ICD-10-CM | POA: Diagnosis not present

## 2018-09-21 DIAGNOSIS — S24102S Unspecified injury at T2-T6 level of thoracic spinal cord, sequela: Secondary | ICD-10-CM | POA: Diagnosis not present

## 2018-09-21 DIAGNOSIS — E46 Unspecified protein-calorie malnutrition: Secondary | ICD-10-CM | POA: Diagnosis not present

## 2018-09-21 DIAGNOSIS — I10 Essential (primary) hypertension: Secondary | ICD-10-CM | POA: Diagnosis not present

## 2018-09-21 DIAGNOSIS — M792 Neuralgia and neuritis, unspecified: Secondary | ICD-10-CM | POA: Diagnosis not present

## 2018-09-21 DIAGNOSIS — G8220 Paraplegia, unspecified: Secondary | ICD-10-CM | POA: Diagnosis not present

## 2018-09-21 DIAGNOSIS — Z936 Other artificial openings of urinary tract status: Secondary | ICD-10-CM | POA: Diagnosis not present

## 2018-09-21 DIAGNOSIS — M869 Osteomyelitis, unspecified: Secondary | ICD-10-CM | POA: Diagnosis not present

## 2018-09-21 DIAGNOSIS — L89312 Pressure ulcer of right buttock, stage 2: Secondary | ICD-10-CM | POA: Diagnosis not present

## 2018-09-21 DIAGNOSIS — F17210 Nicotine dependence, cigarettes, uncomplicated: Secondary | ICD-10-CM | POA: Diagnosis not present

## 2018-09-21 DIAGNOSIS — Z4800 Encounter for change or removal of nonsurgical wound dressing: Secondary | ICD-10-CM | POA: Diagnosis not present

## 2018-09-21 DIAGNOSIS — Z87440 Personal history of urinary (tract) infections: Secondary | ICD-10-CM | POA: Diagnosis not present

## 2018-09-21 DIAGNOSIS — X16XXXD Contact with hot heating appliances, radiators and pipes, subsequent encounter: Secondary | ICD-10-CM | POA: Diagnosis not present

## 2018-09-21 DIAGNOSIS — Z89611 Acquired absence of right leg above knee: Secondary | ICD-10-CM | POA: Diagnosis not present

## 2018-09-21 DIAGNOSIS — Z993 Dependence on wheelchair: Secondary | ICD-10-CM | POA: Diagnosis not present

## 2018-09-21 DIAGNOSIS — T8789 Other complications of amputation stump: Secondary | ICD-10-CM | POA: Diagnosis not present

## 2018-09-21 DIAGNOSIS — K219 Gastro-esophageal reflux disease without esophagitis: Secondary | ICD-10-CM | POA: Diagnosis not present

## 2018-09-21 DIAGNOSIS — L97112 Non-pressure chronic ulcer of right thigh with fat layer exposed: Secondary | ICD-10-CM | POA: Diagnosis not present

## 2018-09-23 DIAGNOSIS — N312 Flaccid neuropathic bladder, not elsewhere classified: Secondary | ICD-10-CM | POA: Diagnosis not present

## 2018-09-25 DIAGNOSIS — Z89611 Acquired absence of right leg above knee: Secondary | ICD-10-CM | POA: Diagnosis not present

## 2018-09-25 DIAGNOSIS — G8220 Paraplegia, unspecified: Secondary | ICD-10-CM | POA: Diagnosis not present

## 2018-09-25 DIAGNOSIS — L97113 Non-pressure chronic ulcer of right thigh with necrosis of muscle: Secondary | ICD-10-CM | POA: Diagnosis not present

## 2018-09-25 DIAGNOSIS — L89313 Pressure ulcer of right buttock, stage 3: Secondary | ICD-10-CM | POA: Diagnosis not present

## 2018-09-25 DIAGNOSIS — R239 Unspecified skin changes: Secondary | ICD-10-CM | POA: Diagnosis not present

## 2018-09-29 DIAGNOSIS — L97112 Non-pressure chronic ulcer of right thigh with fat layer exposed: Secondary | ICD-10-CM | POA: Diagnosis not present

## 2018-09-29 DIAGNOSIS — T8789 Other complications of amputation stump: Secondary | ICD-10-CM | POA: Diagnosis not present

## 2018-09-29 DIAGNOSIS — M792 Neuralgia and neuritis, unspecified: Secondary | ICD-10-CM | POA: Diagnosis not present

## 2018-09-29 DIAGNOSIS — G8220 Paraplegia, unspecified: Secondary | ICD-10-CM | POA: Diagnosis not present

## 2018-09-29 DIAGNOSIS — L89312 Pressure ulcer of right buttock, stage 2: Secondary | ICD-10-CM | POA: Diagnosis not present

## 2018-09-29 DIAGNOSIS — M869 Osteomyelitis, unspecified: Secondary | ICD-10-CM | POA: Diagnosis not present

## 2018-09-30 ENCOUNTER — Other Ambulatory Visit: Payer: Self-pay | Admitting: Internal Medicine

## 2018-10-02 DIAGNOSIS — T8789 Other complications of amputation stump: Secondary | ICD-10-CM | POA: Diagnosis not present

## 2018-10-02 DIAGNOSIS — M869 Osteomyelitis, unspecified: Secondary | ICD-10-CM | POA: Diagnosis not present

## 2018-10-02 DIAGNOSIS — L89312 Pressure ulcer of right buttock, stage 2: Secondary | ICD-10-CM | POA: Diagnosis not present

## 2018-10-02 DIAGNOSIS — M792 Neuralgia and neuritis, unspecified: Secondary | ICD-10-CM | POA: Diagnosis not present

## 2018-10-02 DIAGNOSIS — G8220 Paraplegia, unspecified: Secondary | ICD-10-CM | POA: Diagnosis not present

## 2018-10-02 DIAGNOSIS — L97112 Non-pressure chronic ulcer of right thigh with fat layer exposed: Secondary | ICD-10-CM | POA: Diagnosis not present

## 2018-10-06 DIAGNOSIS — M869 Osteomyelitis, unspecified: Secondary | ICD-10-CM | POA: Diagnosis not present

## 2018-10-06 DIAGNOSIS — M792 Neuralgia and neuritis, unspecified: Secondary | ICD-10-CM | POA: Diagnosis not present

## 2018-10-06 DIAGNOSIS — L89312 Pressure ulcer of right buttock, stage 2: Secondary | ICD-10-CM | POA: Diagnosis not present

## 2018-10-06 DIAGNOSIS — L97112 Non-pressure chronic ulcer of right thigh with fat layer exposed: Secondary | ICD-10-CM | POA: Diagnosis not present

## 2018-10-06 DIAGNOSIS — G8220 Paraplegia, unspecified: Secondary | ICD-10-CM | POA: Diagnosis not present

## 2018-10-06 DIAGNOSIS — T8789 Other complications of amputation stump: Secondary | ICD-10-CM | POA: Diagnosis not present

## 2018-10-13 DIAGNOSIS — L97112 Non-pressure chronic ulcer of right thigh with fat layer exposed: Secondary | ICD-10-CM | POA: Diagnosis not present

## 2018-10-13 DIAGNOSIS — M792 Neuralgia and neuritis, unspecified: Secondary | ICD-10-CM | POA: Diagnosis not present

## 2018-10-13 DIAGNOSIS — T8789 Other complications of amputation stump: Secondary | ICD-10-CM | POA: Diagnosis not present

## 2018-10-13 DIAGNOSIS — M869 Osteomyelitis, unspecified: Secondary | ICD-10-CM | POA: Diagnosis not present

## 2018-10-13 DIAGNOSIS — L89312 Pressure ulcer of right buttock, stage 2: Secondary | ICD-10-CM | POA: Diagnosis not present

## 2018-10-13 DIAGNOSIS — G8220 Paraplegia, unspecified: Secondary | ICD-10-CM | POA: Diagnosis not present

## 2018-10-16 ENCOUNTER — Other Ambulatory Visit: Payer: Self-pay | Admitting: Internal Medicine

## 2018-10-16 DIAGNOSIS — L89313 Pressure ulcer of right buttock, stage 3: Secondary | ICD-10-CM | POA: Diagnosis not present

## 2018-10-16 DIAGNOSIS — L97113 Non-pressure chronic ulcer of right thigh with necrosis of muscle: Secondary | ICD-10-CM | POA: Diagnosis not present

## 2018-10-16 DIAGNOSIS — R239 Unspecified skin changes: Secondary | ICD-10-CM | POA: Diagnosis not present

## 2018-10-16 DIAGNOSIS — L89213 Pressure ulcer of right hip, stage 3: Secondary | ICD-10-CM | POA: Diagnosis not present

## 2018-10-16 DIAGNOSIS — Z89611 Acquired absence of right leg above knee: Secondary | ICD-10-CM | POA: Diagnosis not present

## 2018-10-19 DIAGNOSIS — T8789 Other complications of amputation stump: Secondary | ICD-10-CM | POA: Diagnosis not present

## 2018-10-19 DIAGNOSIS — G8220 Paraplegia, unspecified: Secondary | ICD-10-CM | POA: Diagnosis not present

## 2018-10-19 DIAGNOSIS — M869 Osteomyelitis, unspecified: Secondary | ICD-10-CM | POA: Diagnosis not present

## 2018-10-19 DIAGNOSIS — M792 Neuralgia and neuritis, unspecified: Secondary | ICD-10-CM | POA: Diagnosis not present

## 2018-10-19 DIAGNOSIS — L97112 Non-pressure chronic ulcer of right thigh with fat layer exposed: Secondary | ICD-10-CM | POA: Diagnosis not present

## 2018-10-19 DIAGNOSIS — L89312 Pressure ulcer of right buttock, stage 2: Secondary | ICD-10-CM | POA: Diagnosis not present

## 2018-10-21 DIAGNOSIS — N312 Flaccid neuropathic bladder, not elsewhere classified: Secondary | ICD-10-CM | POA: Diagnosis not present

## 2018-10-27 DIAGNOSIS — M792 Neuralgia and neuritis, unspecified: Secondary | ICD-10-CM | POA: Diagnosis not present

## 2018-10-27 DIAGNOSIS — G8220 Paraplegia, unspecified: Secondary | ICD-10-CM | POA: Diagnosis not present

## 2018-10-27 DIAGNOSIS — L97112 Non-pressure chronic ulcer of right thigh with fat layer exposed: Secondary | ICD-10-CM | POA: Diagnosis not present

## 2018-10-27 DIAGNOSIS — M869 Osteomyelitis, unspecified: Secondary | ICD-10-CM | POA: Diagnosis not present

## 2018-10-27 DIAGNOSIS — T8789 Other complications of amputation stump: Secondary | ICD-10-CM | POA: Diagnosis not present

## 2018-10-27 DIAGNOSIS — L89312 Pressure ulcer of right buttock, stage 2: Secondary | ICD-10-CM | POA: Diagnosis not present

## 2018-11-03 DIAGNOSIS — L89312 Pressure ulcer of right buttock, stage 2: Secondary | ICD-10-CM | POA: Diagnosis not present

## 2018-11-03 DIAGNOSIS — M792 Neuralgia and neuritis, unspecified: Secondary | ICD-10-CM | POA: Diagnosis not present

## 2018-11-03 DIAGNOSIS — G8220 Paraplegia, unspecified: Secondary | ICD-10-CM | POA: Diagnosis not present

## 2018-11-03 DIAGNOSIS — T8789 Other complications of amputation stump: Secondary | ICD-10-CM | POA: Diagnosis not present

## 2018-11-03 DIAGNOSIS — M869 Osteomyelitis, unspecified: Secondary | ICD-10-CM | POA: Diagnosis not present

## 2018-11-03 DIAGNOSIS — L97112 Non-pressure chronic ulcer of right thigh with fat layer exposed: Secondary | ICD-10-CM | POA: Diagnosis not present

## 2018-11-09 DIAGNOSIS — M869 Osteomyelitis, unspecified: Secondary | ICD-10-CM | POA: Diagnosis not present

## 2018-11-09 DIAGNOSIS — L89312 Pressure ulcer of right buttock, stage 2: Secondary | ICD-10-CM | POA: Diagnosis not present

## 2018-11-09 DIAGNOSIS — M792 Neuralgia and neuritis, unspecified: Secondary | ICD-10-CM | POA: Diagnosis not present

## 2018-11-09 DIAGNOSIS — T8789 Other complications of amputation stump: Secondary | ICD-10-CM | POA: Diagnosis not present

## 2018-11-09 DIAGNOSIS — L97112 Non-pressure chronic ulcer of right thigh with fat layer exposed: Secondary | ICD-10-CM | POA: Diagnosis not present

## 2018-11-09 DIAGNOSIS — G8220 Paraplegia, unspecified: Secondary | ICD-10-CM | POA: Diagnosis not present

## 2018-11-12 ENCOUNTER — Encounter: Payer: Self-pay | Admitting: Physical Medicine & Rehabilitation

## 2018-11-12 ENCOUNTER — Encounter: Payer: Medicare Other | Attending: Physical Medicine & Rehabilitation | Admitting: Physical Medicine & Rehabilitation

## 2018-11-12 ENCOUNTER — Other Ambulatory Visit: Payer: Self-pay

## 2018-11-12 VITALS — BP 121/82 | HR 90 | Ht 71.0 in | Wt 180.0 lb

## 2018-11-12 DIAGNOSIS — F1010 Alcohol abuse, uncomplicated: Secondary | ICD-10-CM | POA: Diagnosis not present

## 2018-11-12 DIAGNOSIS — R269 Unspecified abnormalities of gait and mobility: Secondary | ICD-10-CM

## 2018-11-12 DIAGNOSIS — Z833 Family history of diabetes mellitus: Secondary | ICD-10-CM | POA: Insufficient documentation

## 2018-11-12 DIAGNOSIS — G479 Sleep disorder, unspecified: Secondary | ICD-10-CM | POA: Insufficient documentation

## 2018-11-12 DIAGNOSIS — G8220 Paraplegia, unspecified: Secondary | ICD-10-CM

## 2018-11-12 DIAGNOSIS — Z8249 Family history of ischemic heart disease and other diseases of the circulatory system: Secondary | ICD-10-CM | POA: Diagnosis not present

## 2018-11-12 DIAGNOSIS — Z841 Family history of disorders of kidney and ureter: Secondary | ICD-10-CM | POA: Diagnosis not present

## 2018-11-12 DIAGNOSIS — F17210 Nicotine dependence, cigarettes, uncomplicated: Secondary | ICD-10-CM | POA: Diagnosis not present

## 2018-11-12 DIAGNOSIS — S34109S Unspecified injury to unspecified level of lumbar spinal cord, sequela: Secondary | ICD-10-CM | POA: Insufficient documentation

## 2018-11-12 DIAGNOSIS — Z79899 Other long term (current) drug therapy: Secondary | ICD-10-CM | POA: Diagnosis not present

## 2018-11-12 DIAGNOSIS — Z823 Family history of stroke: Secondary | ICD-10-CM | POA: Insufficient documentation

## 2018-11-12 DIAGNOSIS — Z720 Tobacco use: Secondary | ICD-10-CM | POA: Diagnosis not present

## 2018-11-12 DIAGNOSIS — M79604 Pain in right leg: Secondary | ICD-10-CM | POA: Insufficient documentation

## 2018-11-12 DIAGNOSIS — Z89611 Acquired absence of right leg above knee: Secondary | ICD-10-CM

## 2018-11-12 DIAGNOSIS — Z87828 Personal history of other (healed) physical injury and trauma: Secondary | ICD-10-CM

## 2018-11-12 DIAGNOSIS — I10 Essential (primary) hypertension: Secondary | ICD-10-CM | POA: Insufficient documentation

## 2018-11-12 DIAGNOSIS — G894 Chronic pain syndrome: Secondary | ICD-10-CM | POA: Diagnosis not present

## 2018-11-12 MED ORDER — METHOCARBAMOL 500 MG PO TABS: 500.0000 mg | ORAL_TABLET | Freq: Two times a day (BID) | ORAL | 1 refills | Status: DC | PRN

## 2018-11-12 MED ORDER — GABAPENTIN 300 MG PO CAPS: 300.0000 mg | ORAL_CAPSULE | Freq: Two times a day (BID) | ORAL | 1 refills | Status: DC

## 2018-11-12 NOTE — Progress Notes (Signed)
Subjective:    Patient ID: Shawn [name]., male    DOB: 1951/01/07, 67 y.o.   MRN: [id number]  HPI 67 y/o male with pmh/psh of paraplegia, Etoh abuse, HTN, L-spine surgery, flap present for follow up of pain, now with right AKA.  Initially stated: Presented since ~1995.  Exacerbated since fracture in 2007. Stable since then.  Narcotics improve the pain.  Cold exacerbate the pain.  Achy.  Non-radiating.  He has sensation to his knees.  Intermittent.  Ice/heat do not help.  Denies falls. Pain makes things uncomfortable.  Last clinic visit 08/13/18. Wife supplements history. Since that time, pt notes healing to his stump. He notes benefit with Gabapentin. He continues to follow up with wound care.  He is still smoking 1 pack/3 days.  Pain Inventory Average Pain 7 Pain Right Now 7 My pain is stabbing  In the last 24 hours, has pain interfered with the following? General activity 7 Relation with others 5 Enjoyment of life 4 What TIME of day is your pain at its worst? morning Sleep (in general) Fair  Pain is worse with: sitting Pain improves with: medication and TENS Relief from Meds: 0  Mobility ability to climb steps?  no do you drive?  no use a wheelchair needs help with transfers  Function disabled: date disabled 56 retired  Neuro/Psych No problems in this area  Prior Studies Any changes since last visit?  no  Physicians involved in your care Any changes since last visit?  no   Family History  Problem Relation Age of Onset  . Stroke Mother   . Coronary artery disease Father   . Coronary artery disease Paternal Uncle   . Coronary artery disease Paternal Aunt    Social History   Socioeconomic History  . Marital status: Married    Spouse name: Not on file  . Number of children: 1  . Years of education: Not on file  . Highest education level: Not on file  Occupational History  . Occupation: DISABILITY    Employer: UNEMPLOYED  Social Needs  . Financial  resource strain: Not on file  . Food insecurity:    Worry: Not on file    Inability: Not on file  . Transportation needs:    Medical: Not on file    Non-medical: Not on file  Tobacco Use  . Smoking status: Current Every Day Smoker    Packs/day: 0.50    Years: 40.00    Pack years: 20.00    Types: Cigarettes  . Smokeless tobacco: Never Used  . Tobacco comment: stopped in 12/2017   Substance and Sexual Activity  . Alcohol use: No    Alcohol/week: 0.0 standard drinks  . Drug use: No  . Sexual activity: Not on file  Lifestyle  . Physical activity:    Days per week: Not on file    Minutes per session: Not on file  . Stress: Not on file  Relationships  . Social connections:    Talks on phone: Not on file    Gets together: Not on file    Attends religious service: Not on file    Active member of club or organization: Not on file    Attends meetings of clubs or organizations: Not on file    Relationship status: Not on file  Other Topics Concern  . Not on file  Social History Narrative   Married, disabled, medicaid, functions independently at home, paraplegic      Family  hx of : hypertension, diabetes, kidney disease, and other cancer   Past Surgical History:  Procedure Laterality Date  . BONE BIOPSY  11/2007   negative for organisms  . COLONOSCOPY WITH PROPOFOL N/A 08/27/2017   Procedure: COLONOSCOPY WITH PROPOFOL;  Surgeon: Irene Shipper, MD;  Location: [facility];  Service: Endoscopy;  Laterality: N/A;  . sacral wound flap  2002   done by Dr. Stephanie Coup  . SPINE SURGERY    . THROAT SURGERY     Past Medical History:  Diagnosis Date  . Allergy   . Arthritis   . C. difficile colitis 11/2008  . Decubitus ulcer 1999   excision of right ischial pressure sore with flap reconstruction done by Dr. Denese Killings 10/31/2008  . GERD (gastroesophageal reflux disease)   . Hyperlipidemia   . Hypertension   . Paraplegia ([place])    secondary to [place]  . Perineal abscess   . Substance  abuse ([facility])    alcohol   BP 121/82   Pulse 90   Ht 5\' 11"  (1.803 m)   Wt 180 lb (81.6 kg)   SpO2 95%   BMI 25.10 kg/m   Opioid Risk Score:   Fall Risk Score:  `1  Depression screen PHQ 2/9  Depression screen [facility] 2/9 08/27/2018 08/13/2018 03/04/2018 11/13/2017 10/15/2017 09/12/2017 09/10/2017  Decreased Interest 1 0 0 0 0 0 0  Down, Depressed, Hopeless 0 0 0 0 0 0 0  PHQ - 2 Score 1 0 0 0 0 0 0  Altered sleeping 1 - - - - - -  Tired, decreased energy 2 - - - - - -  Change in appetite 1 - - - - - -  Feeling bad or failure about yourself  0 - - - - - -  Trouble concentrating 1 - - - - - -  Moving slowly or fidgety/restless 1 - - - - - -  Suicidal thoughts 0 - - - - - -  PHQ-9 Score 7 - - - - - -  Difficult doing work/chores Not difficult at all - - - - - -  Some recent data might be hidden    Review of Systems  HENT: Negative.   Eyes: Negative.   Respiratory: Negative.   Cardiovascular: Positive for leg swelling.  Gastrointestinal: Negative.   Endocrine: Negative.   Genitourinary: Positive for difficulty urinating.  Musculoskeletal: Positive for arthralgias and myalgias.  Skin: Negative.   Allergic/Immunologic: Negative.   Neurological: Positive for weakness and numbness.  Hematological: Negative.   Psychiatric/Behavioral: Negative.   All other systems reviewed and are negative.     Objective:   Physical Exam Gen: NAD. Vital signs reviewed HENT: Normocephalic, Atraumatic Eyes: EOMI. No discharge.  Cardio: RRR. No JVD. Pulm: B/l clear to auscultation.  Effort normal. Abd: Nondistended, BS+ MSK:  Gait nonnambulatory.   No TTP right leg.    Right AKA Neuro:   Sensation absent below left knee  Strength  RLE HF 3/5    LLE HF 1/5, distally 0/5 Skin: Warm and Dry. Intact. Right AKA healing, new open wound along lateral aspect.    Assessment & Plan:  67 y/o male with pmh/psh of paraplegia secondary to lumbar SCI, Etoh abuse, HTN, L-spine surgery, flap present for  follow up for pain, now with right AKA.  1. Chronic right leg pain with chronic pain syndrome, now with AKA and phantom limb pain             Cont  Cymbalta 60mg              Robaxin 500 BID PRN ordered             Will increase Gabapentin 300 BID             D/c Tramadol              Follow up with Wound Care/Surg/ID             Oral swab reviewed              2. Gait abnormality             Cont wheelchair  3. Sleep disturbance             Due to pain, see #1  4. Paraplegia             Being followed by PCP             Pt may follow if additional needs arise  5. Tobacco abuse             Educated on abstinence again (x4), smoking 1 pack/3 days

## 2018-11-13 DIAGNOSIS — R239 Unspecified skin changes: Secondary | ICD-10-CM | POA: Diagnosis not present

## 2018-11-13 DIAGNOSIS — G8220 Paraplegia, unspecified: Secondary | ICD-10-CM | POA: Diagnosis not present

## 2018-11-13 DIAGNOSIS — L89313 Pressure ulcer of right buttock, stage 3: Secondary | ICD-10-CM | POA: Diagnosis not present

## 2018-11-13 DIAGNOSIS — L97113 Non-pressure chronic ulcer of right thigh with necrosis of muscle: Secondary | ICD-10-CM | POA: Diagnosis not present

## 2018-11-13 DIAGNOSIS — Z89611 Acquired absence of right leg above knee: Secondary | ICD-10-CM | POA: Diagnosis not present

## 2018-11-16 ENCOUNTER — Ambulatory Visit (INDEPENDENT_AMBULATORY_CARE_PROVIDER_SITE_OTHER): Payer: Medicare Other | Admitting: Internal Medicine

## 2018-11-16 ENCOUNTER — Encounter: Payer: Self-pay | Admitting: Internal Medicine

## 2018-11-16 ENCOUNTER — Other Ambulatory Visit: Payer: Self-pay

## 2018-11-16 VITALS — BP 132/66 | HR 99 | Temp 98.0°F | Ht 73.5 in

## 2018-11-16 DIAGNOSIS — R5382 Chronic fatigue, unspecified: Secondary | ICD-10-CM

## 2018-11-16 DIAGNOSIS — Z79899 Other long term (current) drug therapy: Secondary | ICD-10-CM

## 2018-11-16 DIAGNOSIS — G8220 Paraplegia, unspecified: Secondary | ICD-10-CM

## 2018-11-16 DIAGNOSIS — F17210 Nicotine dependence, cigarettes, uncomplicated: Secondary | ICD-10-CM | POA: Diagnosis not present

## 2018-11-16 DIAGNOSIS — I10 Essential (primary) hypertension: Secondary | ICD-10-CM | POA: Diagnosis not present

## 2018-11-16 NOTE — Progress Notes (Signed)
   CC: Fatigue  HPI:  [name] is a 67 y.o. male who presented to the clinic for continued evaluation and management of his chronic medical illnesses. For a detailed assessment and plan please refer to problem based charting below.   Past Medical History:  Diagnosis Date  . Allergy   . Arthritis   . C. difficile colitis 11/2008  . Decubitus ulcer 1999   excision of right ischial pressure sore with flap reconstruction done by Dr. Denese [name] 10/31/2008  . GERD (gastroesophageal reflux disease)   . Hyperlipidemia   . Hypertension   . Paraplegia (Shawn [name])    secondary to Shawn [name]  . Perineal abscess   . Substance abuse (Shawn [name])    alcohol   Review of Systems: 12 point ROS preformed. All negative aside from those mentioned in the HPI.  Physical Exam: Vitals:   11/16/18 1557  BP: 132/66  Pulse: 99  Temp: 98 F (36.7 C)  TempSrc: Oral  SpO2: 98%  Height: 6' 1.5" (1.867 m)   General: Well nourished male in no acute distress Pulm: Good air movement with no wheezing or crackles  CV: RRR, no murmurs, no rubs  Abdomen: Active bowel sounds, soft, non-distended, no tenderness to palpation   Assessment & Plan:   See Encounters Tab for problem based charting.  Patient discussed with Dr. Angelia [name]

## 2018-11-16 NOTE — Assessment & Plan Note (Addendum)
Patient continues to follow with Dr. Posey Pronto. Recently changed to gabapentin and methocarbamol. He states that these medications help with his pain. He understands that his pain will never be completely gone but the goal is to make it manageable. He states that he used to enjoy being outdoors, fishing, and swimming. He would like to be able to do some of these things again.

## 2018-11-16 NOTE — Patient Instructions (Signed)
Thank you for allowing me to provide your care. Today we're not making any medication changes so please continue all all medications as prescribed.  For your fatigue, I think this is likely multifactorial relating to deconditioning, medications, and abnormal sleep cycle. It's very important that you work on getting back on a regular sleep routine. I have provided you with some information below.  In regards to her pain management, please continue to follow with Dr. Posey Pronto. The goal will be to get you back to doing the things you enjoyed including fishing, swimming, and being outdoors.  I will see you back in three months or sooner if anything arises.  Hypersomnia Hypersomnia is when you feel extremely tired during the day even though you're getting plenty of sleep at night. You may need to take naps during the day, and you may also be extremely difficult to wake up when you are sleeping. What are the causes? The cause of your hypersomnia may not be known. Hypersomnia may be caused by:  Medicines.  Sleep disorders, such as narcolepsy.  Trauma or injury to your head or nervous system.  Using drugs or alcohol.  Tumors.  Medical conditions, such as depression or hypothyroidism.  Genetics.  What are the signs or symptoms? The main symptoms of hypersomnia include:  Feeling extremely tired throughout the day.  Being very difficult to wake up.  Sleeping for longer and longer periods.  Taking naps throughout the day.  Other symptoms may include:  Feeling: ? Restless. ? Annoyed. ? Anxious. ? Low energy.  Having difficulty: ? Remembering. ? Speaking. ? Thinking.  Losing your appetite.  Experiencing hallucinations.  How is this diagnosed? Hypersomnia may be diagnosed by:  Medical history and physical exam. This will include a sleep history.  Completing sleep logs.  Tests may also be done, such as: ? Polysomnography. ? Multiple sleep latency test (MSLT).  How is  this treated? There is no cure for hypersomnia, but treatment can be very effective in helping manage the condition. Treatment may include:  Lifestyle and sleeping strategies to help cope with the condition.  Stimulant medicines.  Treating any underlying causes of hypersomnia.  Follow these instructions at home:  Take medicines only as directed by your health care provider.  Schedule short naps for when you feel sleepiest during the day. Tell your employer or teachers that you have hypersomnia. You may be able to adjust your schedule to include time for naps.  Avoid drinking alcohol or caffeinated beverages.  Do not eat a heavy meal before bedtime. Eat at about the same times every day.  Do not drive or operate heavy machinery if you are sleepy.  Do not swim or go out on the water without a life jacket.  If possible, adjust your schedule so that you do not have to work or be active at night.  Keep all follow-up visits as directed by your health care provider. This is important. Contact a health care provider if:  You have new symptoms.  Your symptoms get worse. Get help right away if: You have serious thoughts of hurting yourself or someone else. This information is not intended to replace advice given to you by your health care provider. Make sure you discuss any questions you have with your health care provider. Document Released: 11/15/2002 Document Revised: 05/02/2016 Document Reviewed: 06/30/2014 Elsevier Interactive Patient Education  [employer].

## 2018-11-17 ENCOUNTER — Encounter: Payer: Self-pay | Admitting: Internal Medicine

## 2018-11-17 DIAGNOSIS — M792 Neuralgia and neuritis, unspecified: Secondary | ICD-10-CM | POA: Diagnosis not present

## 2018-11-17 DIAGNOSIS — T8789 Other complications of amputation stump: Secondary | ICD-10-CM | POA: Diagnosis not present

## 2018-11-17 DIAGNOSIS — M869 Osteomyelitis, unspecified: Secondary | ICD-10-CM | POA: Diagnosis not present

## 2018-11-17 DIAGNOSIS — G8220 Paraplegia, unspecified: Secondary | ICD-10-CM | POA: Diagnosis not present

## 2018-11-17 DIAGNOSIS — L89312 Pressure ulcer of right buttock, stage 2: Secondary | ICD-10-CM | POA: Diagnosis not present

## 2018-11-17 DIAGNOSIS — L97112 Non-pressure chronic ulcer of right thigh with fat layer exposed: Secondary | ICD-10-CM | POA: Diagnosis not present

## 2018-11-17 NOTE — Assessment & Plan Note (Signed)
Patient voices concerns about his chronic fatigue. States that it has gotten worse since his hospitalization earlier this year. Some days are worse than others. He has frequent daytime napping and often does not sleep at night. He tells me that prior to his hospitalization he had good strength in his upper extremities and was able to move himself when needed. Now he feels that he tires easily. On physical exam he does not have any focal weakness. We discussed that fatigue is often a multifactorial issue. We discussed possible medications at play including gabapentin and methocarbamol but it is a balance between controlling his pain and keeping him functional. Suggest that a portion of this may be due to deconditioning related to his frequent hospitalizations. He is not interested in physical therapy. Discussed that another large portion of his fatigue may be related to his abnormal sleep patterns. We discussed good sleep hygiene and he will work on getting back on a normal sleep schedule.

## 2018-11-17 NOTE — Progress Notes (Signed)
Internal Medicine Clinic Attending  Case discussed with Dr. Helberg at the time of the visit.  We reviewed the resident's history and exam and pertinent patient test results.  I agree with the assessment, diagnosis, and plan of care documented in the resident's note.    

## 2018-11-17 NOTE — Assessment & Plan Note (Signed)
Patient's blood pressure is currently well controlled on amlodipine 10 mg and hydrochlorothiazide 25 mg. He is supposed to be taking losartan 5 mg daily however does not take this medication and does not have any at home. He states that when his home health nurse comes out his blood pressure is typically around 130/80. He has not noticed any adverse effects do these medications. Aside from the losartan he is able to obtain and take all his medications without issue. We discussed that given his blood pressure is currently well controlled not restarting losartan and just following clinically. He was instructed to inform the clinic if his home health readings begin to show values greater than 130/80. In that case we would restart his losartan. Patient had blood work done in September that illustrated normal renal function persistent hypokalemia. He is continuing to take potassium supplementation.

## 2018-11-18 DIAGNOSIS — N312 Flaccid neuropathic bladder, not elsewhere classified: Secondary | ICD-10-CM | POA: Diagnosis not present

## 2018-11-20 DIAGNOSIS — I10 Essential (primary) hypertension: Secondary | ICD-10-CM | POA: Diagnosis not present

## 2018-11-20 DIAGNOSIS — X16XXXD Contact with hot heating appliances, radiators and pipes, subsequent encounter: Secondary | ICD-10-CM | POA: Diagnosis not present

## 2018-11-20 DIAGNOSIS — M869 Osteomyelitis, unspecified: Secondary | ICD-10-CM | POA: Diagnosis not present

## 2018-11-20 DIAGNOSIS — L89312 Pressure ulcer of right buttock, stage 2: Secondary | ICD-10-CM | POA: Diagnosis not present

## 2018-11-20 DIAGNOSIS — Z4800 Encounter for change or removal of nonsurgical wound dressing: Secondary | ICD-10-CM | POA: Diagnosis not present

## 2018-11-20 DIAGNOSIS — M792 Neuralgia and neuritis, unspecified: Secondary | ICD-10-CM | POA: Diagnosis not present

## 2018-11-20 DIAGNOSIS — Z87440 Personal history of urinary (tract) infections: Secondary | ICD-10-CM | POA: Diagnosis not present

## 2018-11-20 DIAGNOSIS — Z993 Dependence on wheelchair: Secondary | ICD-10-CM | POA: Diagnosis not present

## 2018-11-20 DIAGNOSIS — L97112 Non-pressure chronic ulcer of right thigh with fat layer exposed: Secondary | ICD-10-CM | POA: Diagnosis not present

## 2018-11-20 DIAGNOSIS — T8789 Other complications of amputation stump: Secondary | ICD-10-CM | POA: Diagnosis not present

## 2018-11-20 DIAGNOSIS — F17210 Nicotine dependence, cigarettes, uncomplicated: Secondary | ICD-10-CM | POA: Diagnosis not present

## 2018-11-20 DIAGNOSIS — E46 Unspecified protein-calorie malnutrition: Secondary | ICD-10-CM | POA: Diagnosis not present

## 2018-11-20 DIAGNOSIS — G8220 Paraplegia, unspecified: Secondary | ICD-10-CM | POA: Diagnosis not present

## 2018-11-20 DIAGNOSIS — Z89611 Acquired absence of right leg above knee: Secondary | ICD-10-CM | POA: Diagnosis not present

## 2018-11-20 DIAGNOSIS — S24102S Unspecified injury at T2-T6 level of thoracic spinal cord, sequela: Secondary | ICD-10-CM | POA: Diagnosis not present

## 2018-11-20 DIAGNOSIS — Z936 Other artificial openings of urinary tract status: Secondary | ICD-10-CM | POA: Diagnosis not present

## 2018-11-20 DIAGNOSIS — K219 Gastro-esophageal reflux disease without esophagitis: Secondary | ICD-10-CM | POA: Diagnosis not present

## 2018-12-04 ENCOUNTER — Emergency Department (HOSPITAL_COMMUNITY)
Admission: EM | Admit: 2018-12-04 | Discharge: 2018-12-04 | Disposition: A | Discharge status: Home / Self Care | Payer: Medicare Other | Attending: Emergency Medicine | Admitting: Emergency Medicine

## 2018-12-04 ENCOUNTER — Emergency Department (HOSPITAL_COMMUNITY): Payer: Medicare Other

## 2018-12-04 ENCOUNTER — Other Ambulatory Visit: Payer: Self-pay

## 2018-12-04 ENCOUNTER — Encounter (HOSPITAL_COMMUNITY): Payer: Self-pay

## 2018-12-04 DIAGNOSIS — S8992XA Unspecified injury of left lower leg, initial encounter: Secondary | ICD-10-CM | POA: Diagnosis present

## 2018-12-04 DIAGNOSIS — S82832A Other fracture of upper and lower end of left fibula, initial encounter for closed fracture: Secondary | ICD-10-CM | POA: Insufficient documentation

## 2018-12-04 DIAGNOSIS — Z79899 Other long term (current) drug therapy: Secondary | ICD-10-CM | POA: Diagnosis not present

## 2018-12-04 DIAGNOSIS — F17210 Nicotine dependence, cigarettes, uncomplicated: Secondary | ICD-10-CM | POA: Insufficient documentation

## 2018-12-04 DIAGNOSIS — W2209XA Striking against other stationary object, initial encounter: Secondary | ICD-10-CM | POA: Diagnosis not present

## 2018-12-04 DIAGNOSIS — I10 Essential (primary) hypertension: Secondary | ICD-10-CM | POA: Diagnosis not present

## 2018-12-04 DIAGNOSIS — S82152A Displaced fracture of left tibial tuberosity, initial encounter for closed fracture: Secondary | ICD-10-CM | POA: Diagnosis not present

## 2018-12-04 DIAGNOSIS — S82102A Unspecified fracture of upper end of left tibia, initial encounter for closed fracture: Secondary | ICD-10-CM | POA: Diagnosis not present

## 2018-12-04 DIAGNOSIS — Y999 Unspecified external cause status: Secondary | ICD-10-CM | POA: Diagnosis not present

## 2018-12-04 DIAGNOSIS — Z89511 Acquired absence of right leg below knee: Secondary | ICD-10-CM | POA: Insufficient documentation

## 2018-12-04 DIAGNOSIS — S82402A Unspecified fracture of shaft of left fibula, initial encounter for closed fracture: Secondary | ICD-10-CM | POA: Diagnosis not present

## 2018-12-04 DIAGNOSIS — Y939 Activity, unspecified: Secondary | ICD-10-CM | POA: Diagnosis not present

## 2018-12-04 DIAGNOSIS — G8220 Paraplegia, unspecified: Secondary | ICD-10-CM | POA: Insufficient documentation

## 2018-12-04 DIAGNOSIS — Y929 Unspecified place or not applicable: Secondary | ICD-10-CM | POA: Insufficient documentation

## 2018-12-04 DIAGNOSIS — R2242 Localized swelling, mass and lump, left lower limb: Secondary | ICD-10-CM | POA: Diagnosis not present

## 2018-12-04 DIAGNOSIS — T1490XA Injury, unspecified, initial encounter: Secondary | ICD-10-CM

## 2018-12-04 MED ORDER — TRAMADOL HCL 50 MG PO TABS
50.0000 mg | ORAL_TABLET | Freq: Once | ORAL | Status: AC
  Administered 2018-12-04: 50 mg via ORAL
  Filled 2018-12-04: qty 1

## 2018-12-04 MED ORDER — TRAMADOL HCL 50 MG PO TABS: 50.0000 mg | ORAL_TABLET | Freq: Four times a day (QID) | ORAL | 0 refills | Status: AC | PRN
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
## 2018-12-04 NOTE — Progress Notes (Signed)
Orthopedic Tech Progress Note Patient Details:  Shawn [name]. 1951/10/22 [id number]  Ortho Devices Type of Ortho Device: Post (long leg) splint Ortho Device/Splint Location: lle Ortho Device/Splint Interventions: Ordered, Application, Adjustment   Post Interventions Patient Tolerated: Well Instructions Provided: Care of device, Adjustment of device   Karolee Stamps 12/04/2018, 7:06 PM

## 2018-12-04 NOTE — ED Notes (Signed)
Xray staff aware that pt is returning to [facility]
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
## 2018-12-04 NOTE — Discharge Instructions (Addendum)
Please follow up with Dr. Berna Bue in 10 days. I have prescribed medication to help with pain. Please keep your splint clean and dry.If you experience any worsening symptoms you may return to the ED for reevaluation.
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
## 2018-12-14 DIAGNOSIS — S82142A Displaced bicondylar fracture of left tibia, initial encounter for closed fracture: Secondary | ICD-10-CM | POA: Diagnosis not present

## 2018-12-16 DIAGNOSIS — N312 Flaccid neuropathic bladder, not elsewhere classified: Secondary | ICD-10-CM | POA: Diagnosis not present

## 2018-12-25 DIAGNOSIS — L89213 Pressure ulcer of right hip, stage 3: Secondary | ICD-10-CM | POA: Diagnosis not present

## 2018-12-25 DIAGNOSIS — L258 Unspecified contact dermatitis due to other agents: Secondary | ICD-10-CM | POA: Diagnosis not present

## 2018-12-25 DIAGNOSIS — L97113 Non-pressure chronic ulcer of right thigh with necrosis of muscle: Secondary | ICD-10-CM | POA: Diagnosis not present

## 2018-12-25 DIAGNOSIS — G8220 Paraplegia, unspecified: Secondary | ICD-10-CM | POA: Diagnosis not present

## 2018-12-25 DIAGNOSIS — L89893 Pressure ulcer of other site, stage 3: Secondary | ICD-10-CM | POA: Diagnosis not present

## 2018-12-25 DIAGNOSIS — Z89611 Acquired absence of right leg above knee: Secondary | ICD-10-CM | POA: Diagnosis not present

## 2018-12-25 DIAGNOSIS — L89313 Pressure ulcer of right buttock, stage 3: Secondary | ICD-10-CM | POA: Diagnosis not present

## 2019-01-04 ENCOUNTER — Encounter: Payer: Self-pay | Admitting: *Deleted

## 2019-01-11 DIAGNOSIS — S82142A Displaced bicondylar fracture of left tibia, initial encounter for closed fracture: Secondary | ICD-10-CM | POA: Diagnosis not present

## 2019-01-13 DIAGNOSIS — N312 Flaccid neuropathic bladder, not elsewhere classified: Secondary | ICD-10-CM | POA: Diagnosis not present

## 2019-01-19 ENCOUNTER — Other Ambulatory Visit: Payer: Self-pay | Admitting: Physical Medicine & Rehabilitation

## 2019-01-22 ENCOUNTER — Encounter: Payer: Self-pay | Admitting: *Deleted

## 2019-02-05 DIAGNOSIS — L89313 Pressure ulcer of right buttock, stage 3: Secondary | ICD-10-CM | POA: Diagnosis not present

## 2019-02-05 DIAGNOSIS — I739 Peripheral vascular disease, unspecified: Secondary | ICD-10-CM | POA: Diagnosis not present

## 2019-02-05 DIAGNOSIS — I10 Essential (primary) hypertension: Secondary | ICD-10-CM | POA: Diagnosis not present

## 2019-02-05 DIAGNOSIS — G8220 Paraplegia, unspecified: Secondary | ICD-10-CM | POA: Diagnosis not present

## 2019-02-05 DIAGNOSIS — F17210 Nicotine dependence, cigarettes, uncomplicated: Secondary | ICD-10-CM | POA: Diagnosis not present

## 2019-02-05 DIAGNOSIS — K219 Gastro-esophageal reflux disease without esophagitis: Secondary | ICD-10-CM | POA: Diagnosis not present

## 2019-02-05 DIAGNOSIS — E46 Unspecified protein-calorie malnutrition: Secondary | ICD-10-CM | POA: Diagnosis not present

## 2019-02-05 DIAGNOSIS — Z89611 Acquired absence of right leg above knee: Secondary | ICD-10-CM | POA: Diagnosis not present

## 2019-02-08 DIAGNOSIS — S82142A Displaced bicondylar fracture of left tibia, initial encounter for closed fracture: Secondary | ICD-10-CM | POA: Diagnosis not present

## 2019-02-10 DIAGNOSIS — N312 Flaccid neuropathic bladder, not elsewhere classified: Secondary | ICD-10-CM | POA: Diagnosis not present

## 2019-02-11 ENCOUNTER — Encounter: Payer: Self-pay | Admitting: Physical Medicine & Rehabilitation

## 2019-02-11 ENCOUNTER — Other Ambulatory Visit: Payer: Self-pay | Admitting: Physical Medicine & Rehabilitation

## 2019-02-11 ENCOUNTER — Encounter: Payer: Medicare Other | Attending: Physical Medicine & Rehabilitation | Admitting: Physical Medicine & Rehabilitation

## 2019-02-11 VITALS — BP 142/87 | HR 81 | Ht 73.5 in | Wt 185.0 lb

## 2019-02-11 DIAGNOSIS — Z841 Family history of disorders of kidney and ureter: Secondary | ICD-10-CM | POA: Insufficient documentation

## 2019-02-11 DIAGNOSIS — Z87828 Personal history of other (healed) physical injury and trauma: Secondary | ICD-10-CM

## 2019-02-11 DIAGNOSIS — Z79899 Other long term (current) drug therapy: Secondary | ICD-10-CM | POA: Insufficient documentation

## 2019-02-11 DIAGNOSIS — G8220 Paraplegia, unspecified: Secondary | ICD-10-CM

## 2019-02-11 DIAGNOSIS — F1010 Alcohol abuse, uncomplicated: Secondary | ICD-10-CM | POA: Insufficient documentation

## 2019-02-11 DIAGNOSIS — G8929 Other chronic pain: Secondary | ICD-10-CM | POA: Diagnosis not present

## 2019-02-11 DIAGNOSIS — Z8249 Family history of ischemic heart disease and other diseases of the circulatory system: Secondary | ICD-10-CM | POA: Insufficient documentation

## 2019-02-11 DIAGNOSIS — Z89611 Acquired absence of right leg above knee: Secondary | ICD-10-CM | POA: Insufficient documentation

## 2019-02-11 DIAGNOSIS — M79604 Pain in right leg: Secondary | ICD-10-CM | POA: Insufficient documentation

## 2019-02-11 DIAGNOSIS — Z720 Tobacco use: Secondary | ICD-10-CM

## 2019-02-11 DIAGNOSIS — Z823 Family history of stroke: Secondary | ICD-10-CM | POA: Insufficient documentation

## 2019-02-11 DIAGNOSIS — R269 Unspecified abnormalities of gait and mobility: Secondary | ICD-10-CM

## 2019-02-11 DIAGNOSIS — M25561 Pain in right knee: Secondary | ICD-10-CM | POA: Diagnosis not present

## 2019-02-11 DIAGNOSIS — I10 Essential (primary) hypertension: Secondary | ICD-10-CM | POA: Diagnosis not present

## 2019-02-11 DIAGNOSIS — S34109S Unspecified injury to unspecified level of lumbar spinal cord, sequela: Secondary | ICD-10-CM | POA: Insufficient documentation

## 2019-02-11 DIAGNOSIS — G894 Chronic pain syndrome: Secondary | ICD-10-CM

## 2019-02-11 DIAGNOSIS — F17210 Nicotine dependence, cigarettes, uncomplicated: Secondary | ICD-10-CM | POA: Insufficient documentation

## 2019-02-11 DIAGNOSIS — G479 Sleep disorder, unspecified: Secondary | ICD-10-CM | POA: Insufficient documentation

## 2019-02-11 DIAGNOSIS — Z833 Family history of diabetes mellitus: Secondary | ICD-10-CM | POA: Diagnosis not present

## 2019-02-11 MED ORDER — METHOCARBAMOL 500 MG PO TABS: 500.0000 mg | ORAL_TABLET | Freq: Two times a day (BID) | ORAL | 1 refills | Status: DC | PRN
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
## 2019-02-18 ENCOUNTER — Encounter: Payer: PRIVATE HEALTH INSURANCE | Admitting: Internal Medicine

## 2019-03-04 ENCOUNTER — Encounter: Payer: PRIVATE HEALTH INSURANCE | Admitting: Internal Medicine

## 2019-03-10 DIAGNOSIS — N312 Flaccid neuropathic bladder, not elsewhere classified: Secondary | ICD-10-CM | POA: Diagnosis not present

## 2019-03-11 ENCOUNTER — Encounter: Payer: PRIVATE HEALTH INSURANCE | Admitting: Internal Medicine

## 2019-03-12 ENCOUNTER — Other Ambulatory Visit: Payer: Self-pay | Admitting: Internal Medicine

## 2019-03-24 DIAGNOSIS — S82142A Displaced bicondylar fracture of left tibia, initial encounter for closed fracture: Secondary | ICD-10-CM | POA: Diagnosis not present

## 2019-04-07 DIAGNOSIS — N312 Flaccid neuropathic bladder, not elsewhere classified: Secondary | ICD-10-CM | POA: Diagnosis not present

## 2019-04-09 DIAGNOSIS — G8220 Paraplegia, unspecified: Secondary | ICD-10-CM | POA: Diagnosis not present

## 2019-04-09 DIAGNOSIS — Z89611 Acquired absence of right leg above knee: Secondary | ICD-10-CM | POA: Diagnosis not present

## 2019-04-09 DIAGNOSIS — L89313 Pressure ulcer of right buttock, stage 3: Secondary | ICD-10-CM | POA: Diagnosis not present

## 2019-05-05 ENCOUNTER — Inpatient Hospital Stay (HOSPITAL_COMMUNITY)
Admission: AD | Admit: 2019-05-05 | Discharge: 2019-05-07 | DRG: 637 | Disposition: A | Discharge status: Home Health | Payer: Medicare Other | Attending: Student in an Organized Health Care Education/Training Program | Admitting: Student in an Organized Health Care Education/Training Program

## 2019-05-05 ENCOUNTER — Other Ambulatory Visit: Payer: Self-pay

## 2019-05-05 ENCOUNTER — Ambulatory Visit (INDEPENDENT_AMBULATORY_CARE_PROVIDER_SITE_OTHER): Payer: Medicare Other | Admitting: Internal Medicine

## 2019-05-05 ENCOUNTER — Inpatient Hospital Stay (HOSPITAL_COMMUNITY): Payer: Medicare Other

## 2019-05-05 ENCOUNTER — Ambulatory Visit (INDEPENDENT_AMBULATORY_CARE_PROVIDER_SITE_OTHER): Payer: Medicare Other | Admitting: Dietician

## 2019-05-05 ENCOUNTER — Encounter: Payer: Self-pay | Admitting: Dietician

## 2019-05-05 VITALS — BP 108/70 | HR 97 | Temp 98.6°F | Ht 73.5 in | Wt 173.3 lb

## 2019-05-05 DIAGNOSIS — L89319 Pressure ulcer of right buttock, unspecified stage: Secondary | ICD-10-CM

## 2019-05-05 DIAGNOSIS — E0800 Diabetes mellitus due to underlying condition with hyperosmolarity without nonketotic hyperglycemic-hyperosmolar coma (NKHHC): Secondary | ICD-10-CM

## 2019-05-05 DIAGNOSIS — T8189XA Other complications of procedures, not elsewhere classified, initial encounter: Secondary | ICD-10-CM | POA: Diagnosis not present

## 2019-05-05 DIAGNOSIS — E1169 Type 2 diabetes mellitus with other specified complication: Secondary | ICD-10-CM | POA: Diagnosis present

## 2019-05-05 DIAGNOSIS — Z713 Dietary counseling and surveillance: Secondary | ICD-10-CM

## 2019-05-05 DIAGNOSIS — E162 Hypoglycemia, unspecified: Secondary | ICD-10-CM | POA: Diagnosis not present

## 2019-05-05 DIAGNOSIS — R5382 Chronic fatigue, unspecified: Secondary | ICD-10-CM

## 2019-05-05 DIAGNOSIS — R634 Abnormal weight loss: Secondary | ICD-10-CM | POA: Diagnosis not present

## 2019-05-05 DIAGNOSIS — T8740 Infection of amputation stump, unspecified extremity: Secondary | ICD-10-CM | POA: Diagnosis not present

## 2019-05-05 DIAGNOSIS — G8220 Paraplegia, unspecified: Secondary | ICD-10-CM

## 2019-05-05 DIAGNOSIS — M8660 Other chronic osteomyelitis, unspecified site: Secondary | ICD-10-CM | POA: Diagnosis present

## 2019-05-05 DIAGNOSIS — L89314 Pressure ulcer of right buttock, stage 4: Secondary | ICD-10-CM | POA: Diagnosis present

## 2019-05-05 DIAGNOSIS — M86651 Other chronic osteomyelitis, right thigh: Secondary | ICD-10-CM | POA: Diagnosis not present

## 2019-05-05 DIAGNOSIS — E1310 Other specified diabetes mellitus with ketoacidosis without coma: Secondary | ICD-10-CM | POA: Diagnosis present

## 2019-05-05 DIAGNOSIS — E1110 Type 2 diabetes mellitus with ketoacidosis without coma: Principal | ICD-10-CM | POA: Diagnosis present

## 2019-05-05 DIAGNOSIS — L89219 Pressure ulcer of right hip, unspecified stage: Secondary | ICD-10-CM

## 2019-05-05 DIAGNOSIS — Z6822 Body mass index (BMI) 22.0-22.9, adult: Secondary | ICD-10-CM | POA: Diagnosis not present

## 2019-05-05 DIAGNOSIS — Y835 Amputation of limb(s) as the cause of abnormal reaction of the patient, or of later complication, without mention of misadventure at the time of the procedure: Secondary | ICD-10-CM | POA: Diagnosis present

## 2019-05-05 DIAGNOSIS — E11622 Type 2 diabetes mellitus with other skin ulcer: Secondary | ICD-10-CM

## 2019-05-05 DIAGNOSIS — D72829 Elevated white blood cell count, unspecified: Secondary | ICD-10-CM | POA: Diagnosis not present

## 2019-05-05 DIAGNOSIS — N319 Neuromuscular dysfunction of bladder, unspecified: Secondary | ICD-10-CM | POA: Diagnosis present

## 2019-05-05 DIAGNOSIS — Z79899 Other long term (current) drug therapy: Secondary | ICD-10-CM | POA: Diagnosis not present

## 2019-05-05 DIAGNOSIS — Z89611 Acquired absence of right leg above knee: Secondary | ICD-10-CM

## 2019-05-05 DIAGNOSIS — L89309 Pressure ulcer of unspecified buttock, unspecified stage: Secondary | ICD-10-CM | POA: Diagnosis not present

## 2019-05-05 DIAGNOSIS — Z880 Allergy status to penicillin: Secondary | ICD-10-CM | POA: Diagnosis not present

## 2019-05-05 DIAGNOSIS — L89310 Pressure ulcer of right buttock, unstageable: Secondary | ICD-10-CM

## 2019-05-05 DIAGNOSIS — Z833 Family history of diabetes mellitus: Secondary | ICD-10-CM

## 2019-05-05 DIAGNOSIS — Z960 Presence of urogenital implants: Secondary | ICD-10-CM

## 2019-05-05 DIAGNOSIS — M86151 Other acute osteomyelitis, right femur: Secondary | ICD-10-CM | POA: Diagnosis present

## 2019-05-05 DIAGNOSIS — R8281 Pyuria: Secondary | ICD-10-CM | POA: Diagnosis not present

## 2019-05-05 DIAGNOSIS — F17210 Nicotine dependence, cigarettes, uncomplicated: Secondary | ICD-10-CM

## 2019-05-05 DIAGNOSIS — E119 Type 2 diabetes mellitus without complications: Secondary | ICD-10-CM | POA: Diagnosis not present

## 2019-05-05 DIAGNOSIS — R739 Hyperglycemia, unspecified: Secondary | ICD-10-CM

## 2019-05-05 DIAGNOSIS — N390 Urinary tract infection, site not specified: Secondary | ICD-10-CM

## 2019-05-05 DIAGNOSIS — E876 Hypokalemia: Secondary | ICD-10-CM | POA: Diagnosis not present

## 2019-05-05 DIAGNOSIS — L89153 Pressure ulcer of sacral region, stage 3: Secondary | ICD-10-CM | POA: Diagnosis present

## 2019-05-05 DIAGNOSIS — L97919 Non-pressure chronic ulcer of unspecified part of right lower leg with unspecified severity: Secondary | ICD-10-CM | POA: Diagnosis present

## 2019-05-05 DIAGNOSIS — E1151 Type 2 diabetes mellitus with diabetic peripheral angiopathy without gangrene: Secondary | ICD-10-CM | POA: Diagnosis not present

## 2019-05-05 DIAGNOSIS — I10 Essential (primary) hypertension: Secondary | ICD-10-CM | POA: Diagnosis not present

## 2019-05-05 DIAGNOSIS — D473 Essential (hemorrhagic) thrombocythemia: Secondary | ICD-10-CM | POA: Diagnosis not present

## 2019-05-05 DIAGNOSIS — Z66 Do not resuscitate: Secondary | ICD-10-CM | POA: Diagnosis not present

## 2019-05-05 DIAGNOSIS — E785 Hyperlipidemia, unspecified: Secondary | ICD-10-CM | POA: Diagnosis not present

## 2019-05-05 DIAGNOSIS — M86659 Other chronic osteomyelitis, unspecified thigh: Secondary | ICD-10-CM | POA: Diagnosis present

## 2019-05-05 DIAGNOSIS — S24102S Unspecified injury at T2-T6 level of thoracic spinal cord, sequela: Secondary | ICD-10-CM | POA: Diagnosis not present

## 2019-05-05 DIAGNOSIS — R0602 Shortness of breath: Secondary | ICD-10-CM

## 2019-05-05 DIAGNOSIS — T83510A Infection and inflammatory reaction due to cystostomy catheter, initial encounter: Secondary | ICD-10-CM

## 2019-05-05 DIAGNOSIS — L89154 Pressure ulcer of sacral region, stage 4: Secondary | ICD-10-CM | POA: Diagnosis not present

## 2019-05-05 HISTORY — DX: Other specified diabetes mellitus with ketoacidosis without coma: E13.10

## 2019-05-05 HISTORY — DX: Type 2 diabetes mellitus with ketoacidosis without coma: E11.10

## 2019-05-05 LAB — CBC WITH DIFFERENTIAL/PLATELET
Abs Immature Granulocytes: 0.1 10*3/uL — ABNORMAL HIGH (ref 0.00–0.07)
Basophils Absolute: 0 10*3/uL (ref 0.0–0.1)
Basophils Relative: 0 %
Eosinophils Absolute: 0 10*3/uL (ref 0.0–0.5)
Eosinophils Relative: 0 %
HCT: 41.5 % (ref 39.0–52.0)
Hemoglobin: 13.9 g/dL (ref 13.0–17.0)
Lymphocytes Relative: 10 %
Lymphs Abs: 1.3 10*3/uL (ref 0.7–4.0)
MCH: 23.1 pg — ABNORMAL LOW (ref 26.0–34.0)
MCHC: 33.5 g/dL (ref 30.0–36.0)
MCV: 68.8 fL — ABNORMAL LOW (ref 80.0–100.0)
Monocytes Absolute: 0.9 10*3/uL (ref 0.1–1.0)
Monocytes Relative: 7 %
Myelocytes: 1 %
Neutro Abs: 11 10*3/uL — ABNORMAL HIGH (ref 1.7–7.7)
Neutrophils Relative %: 82 %
Platelets: 444 10*3/uL — ABNORMAL HIGH (ref 150–400)
RBC: 6.03 MIL/uL — ABNORMAL HIGH (ref 4.22–5.81)
RDW: 20.3 % — ABNORMAL HIGH (ref 11.5–15.5)
WBC: 13.4 10*3/uL — ABNORMAL HIGH (ref 4.0–10.5)
nRBC: 0 % (ref 0.0–0.2)
nRBC: 0 /100 WBC

## 2019-05-05 LAB — POCT GLYCOSYLATED HEMOGLOBIN (HGB A1C): HbA1c POC (<> result, manual entry): >14 % — AB (ref 4.0–5.6)

## 2019-05-05 LAB — GLUCOSE, CAPILLARY
Glucose-Capillary: >600 mg/dL (ref 70–99)
Glucose-Capillary: 513 mg/dL (ref 70–99)
Glucose-Capillary: 512 mg/dL (ref 70–99)
Glucose-Capillary: 436 mg/dL — ABNORMAL HIGH (ref 70–99)
Glucose-Capillary: 379 mg/dL — ABNORMAL HIGH (ref 70–99)
Glucose-Capillary: 258 mg/dL — ABNORMAL HIGH (ref 70–99)
Glucose-Capillary: 342 mg/dL — ABNORMAL HIGH (ref 70–99)
Glucose-Capillary: 178 mg/dL — ABNORMAL HIGH (ref 70–99)
Glucose-Capillary: 183 mg/dL — ABNORMAL HIGH (ref 70–99)

## 2019-05-05 LAB — COMPREHENSIVE METABOLIC PANEL
ALT: 14 U/L (ref 0–44)
AST: 11 U/L — ABNORMAL LOW (ref 15–41)
Albumin: 2.7 g/dL — ABNORMAL LOW (ref 3.5–5.0)
Alkaline Phosphatase: 112 U/L (ref 38–126)
Anion gap: 21 — ABNORMAL HIGH (ref 5–15)
BUN: 19 mg/dL (ref 8–23)
CO2: 23 mmol/L (ref 22–32)
Calcium: 9.2 mg/dL (ref 8.9–10.3)
Chloride: 76 mmol/L — ABNORMAL LOW (ref 98–111)
Creatinine, Ser: 1.07 mg/dL (ref 0.61–1.24)
GFR calc Af Amer: >60 mL/min (ref >60)
GFR calc non Af Amer: >60 mL/min (ref >60)
Glucose, Bld: 616 mg/dL (ref 70–99)
Potassium: 3.9 mmol/L (ref 3.5–5.1)
Sodium: 120 mmol/L — ABNORMAL LOW (ref 135–145)
Total Bilirubin: 1.6 mg/dL — ABNORMAL HIGH (ref 0.3–1.2)
Total Protein: 6.8 g/dL (ref 6.5–8.1)

## 2019-05-05 LAB — POCT URINALYSIS DIPSTICK
Bilirubin, UA: NEGATIVE
Glucose, UA: POSITIVE — AB
Ketones, UA: 15
Nitrite, UA: NEGATIVE
Protein, UA: POSITIVE — AB
Spec Grav, UA: 1.015 (ref 1.010–1.025)
Urobilinogen, UA: 0.2 E.U./dL
pH, UA: 7.5 (ref 5.0–8.0)

## 2019-05-05 LAB — C-REACTIVE PROTEIN: CRP: 17.4 mg/dL — ABNORMAL HIGH (ref <1.0)

## 2019-05-05 LAB — TSH: TSH: 0.829 u[IU]/mL (ref 0.350–4.500)

## 2019-05-05 LAB — SEDIMENTATION RATE: Sed Rate: 30 mm/hr — ABNORMAL HIGH (ref 0–16)

## 2019-05-05 LAB — MRSA PCR SCREENING: MRSA by PCR: POSITIVE — AB

## 2019-05-05 MED ORDER — SODIUM CHLORIDE 0.9 % IV SOLN: INTRAVENOUS | Status: DC

## 2019-05-05 MED ORDER — POTASSIUM CHLORIDE 10 MEQ/100ML IV SOLN
10.0000 meq | INTRAVENOUS | Status: AC
  Administered 2019-05-05 (×2): 10 meq via INTRAVENOUS
  Filled 2019-05-05 (×2): qty 100

## 2019-05-05 MED ORDER — INSULIN REGULAR(HUMAN) IN NACL 100-0.9 UT/100ML-% IV SOLN
INTRAVENOUS | Status: DC
  Administered 2019-05-05: 3.8 [IU]/h via INTRAVENOUS
  Administered 2019-05-06: 1.7 [IU]/h via INTRAVENOUS
  Filled 2019-05-05 (×2): qty 100

## 2019-05-05 MED ORDER — SODIUM CHLORIDE 0.9% FLUSH: 10.0000 mL | INTRAVENOUS | Status: DC | PRN

## 2019-05-05 MED ORDER — PANTOPRAZOLE SODIUM 40 MG PO TBEC
40.0000 mg | DELAYED_RELEASE_TABLET | Freq: Every day | ORAL | Status: DC
  Administered 2019-05-06 – 2019-05-07 (×2): 40 mg via ORAL
  Filled 2019-05-05 (×2): qty 1

## 2019-05-05 MED ORDER — ENOXAPARIN SODIUM 40 MG/0.4ML ~~LOC~~ SOLN
40.0000 mg | SUBCUTANEOUS | Status: DC
  Administered 2019-05-05 – 2019-05-06 (×2): 40 mg via SUBCUTANEOUS
  Filled 2019-05-05 (×2): qty 0.4

## 2019-05-05 MED ORDER — LACTATED RINGERS IV SOLN
INTRAVENOUS | Status: DC
  Administered 2019-05-05: 21:00:00 via INTRAVENOUS

## 2019-05-05 MED ORDER — SODIUM CHLORIDE 0.9% FLUSH
10.0000 mL | Freq: Two times a day (BID) | INTRAVENOUS | Status: DC
  Administered 2019-05-05 – 2019-05-06 (×2): 10 mL

## 2019-05-05 MED ORDER — POTASSIUM CHLORIDE 10 MEQ/100ML IV SOLN: 10.0000 meq | INTRAVENOUS | Status: DC

## 2019-05-05 MED ORDER — DEXTROSE-NACL 5-0.45 % IV SOLN
INTRAVENOUS | Status: DC
  Administered 2019-05-05: 22:00:00 via INTRAVENOUS

## 2019-05-05 MED ORDER — INSULIN ASPART 100 UNIT/ML ~~LOC~~ SOLN
10.0000 [IU] | Freq: Once | SUBCUTANEOUS | Status: AC
  Administered 2019-05-05: 10 [IU] via SUBCUTANEOUS

## 2019-05-05 MED ORDER — SODIUM CHLORIDE 0.9 % IV SOLN
Freq: Once | INTRAVENOUS | Status: AC
  Administered 2019-05-05: 12:00:00 via INTRAVENOUS

## 2019-05-05 MED ORDER — SODIUM CHLORIDE 0.9 % IV SOLN
2.0000 g | Freq: Three times a day (TID) | INTRAVENOUS | Status: DC
  Administered 2019-05-05 – 2019-05-06 (×3): 2 g via INTRAVENOUS
  Filled 2019-05-05 (×5): qty 2

## 2019-05-05 MED ORDER — VANCOMYCIN HCL 10 G IV SOLR
1500.0000 mg | INTRAVENOUS | Status: DC
  Administered 2019-05-05: 1500 mg via INTRAVENOUS
  Filled 2019-05-05 (×2): qty 1500

## 2019-05-05 MED ORDER — ZOLPIDEM TARTRATE 5 MG PO TABS: 5.0000 mg | ORAL_TABLET | Freq: Every evening | ORAL | Status: DC | PRN

## 2019-05-05 MED ORDER — SODIUM CHLORIDE 0.9 % IV BOLUS
1000.0000 mL | Freq: Once | INTRAVENOUS | Status: AC
  Administered 2019-05-05: 14:00:00 1000 mL via INTRAVENOUS

## 2019-05-05 MED ORDER — METRONIDAZOLE IN NACL 5-0.79 MG/ML-% IV SOLN
500.0000 mg | Freq: Three times a day (TID) | INTRAVENOUS | Status: DC
  Administered 2019-05-05 – 2019-05-06 (×3): 500 mg via INTRAVENOUS
  Filled 2019-05-05 (×3): qty 100

## 2019-05-05 NOTE — Progress Notes (Signed)
Pharmacy Antibiotic Note  Shawn Levi. is a 67 y.o. male admitted on 05/05/2019 with R stump infection and newly diagnosed DM. Pharmacy has been consulted for vancomycin and aztreonam dosing. Cr 1.07, unclear baseline, pt has hx of paraplegia and R BKA, so will be conservative with vancomycin dosing.   Plan: Vancomycin 1500mg  IV q24h - est AUC 463 Aztreonam 2g IV q8h Flagyl 500mg  IV q8h per MD     Temp (24hrs), Avg:98.6 F (37 C), Min:98.6 F (37 C), Max:98.6 F (37 C)  Recent Labs  Lab 05/05/19 1117  WBC 13.4*  CREATININE 1.07    Estimated Creatinine Clearance: 72.4 mL/min (by C-G formula based on SCr of 1.07 mg/dL).    Allergies  Allergen Reactions  . Penicillins Hives    Has patient had a PCN reaction causing immediate rash, facial/tongue/throat swelling, SOB or lightheadedness with hypotension: Yes Has patient had a PCN reaction causing severe rash involving mucus membranes or skin necrosis: Unknown Has patient had a PCN reaction that required hospitalization: No Has patient had a PCN reaction occurring within the last 10 years: Yes If all of the above answers are "NO", then may proceed with Cephalosporin use.   . Statins     Uncontrolled diarrhea    Antimicrobials this admission: Vancomycin 5/27 >> Metronidazole 5/27 >> Aztreonam 5/27 >>   Microbiology results: N/a   Thank you for allowing pharmacy to be a part of this patient's care.   Arrie Senate, PharmD, BCPS Clinical Pharmacist [phone] Please check AMION for all [place] numbers 05/05/2019

## 2019-05-05 NOTE — Progress Notes (Signed)
   CC: Worsening fatigue and weight loss.  HPI:  [name] is a 68 y.o. with past medical history as listed below came to the clinic with his 5 today with complaint of worsening fatigue, loss of appetite, unintentional weight loss, polyuria and polydipsia going on for more than 2 weeks now. Denies any fever or chills.  He stays mostly in bed.  Per wife he is losing weight, no proper documentation of his weight as he cannot stand due to paraplegia. Denies any nausea, vomiting, diarrhea or abdominal pain. Denies any burning micturition, patient has a supra pubic catheter in place and had an appointment at [facility] long for replacement today. Patient has poorly healing decubitus ulcer of right ischial area and recently opened up a wound on his right AKA stump with serosanguineous discharge.  He follow-up with wound care with [facility] in [place].  Please see assessment and plan for his chronic conditions.  Past Medical History:  Diagnosis Date  . Allergy   . Arthritis   . C. difficile colitis 11/2008  . Decubitus ulcer 1999   excision of right ischial pressure sore with flap reconstruction done by Dr. Denese Killings 10/31/2008  . GERD (gastroesophageal reflux disease)   . Hyperlipidemia   . Hypertension   . Paraplegia ([place])    secondary to [place]  . Perineal abscess   . Substance abuse ([place])    alcohol   Review of Systems: Negative except mentioned in HPI.  Physical Exam:  Vitals:   05/05/19 1013 05/05/19 1015  BP:  108/70  Pulse:  97  Temp:  98.6 F (37 C)  TempSrc:  Oral  SpO2:  98%  Height: 6' 1.5" (1.867 m)     General: Vital signs reviewed.  Patient is well-developed, paraplegic and sitting in wheelchair,in no acute distress and cooperative with exam.  Head: Normocephalic and atraumatic. Eyes: EOMI, conjunctivae normal, no scleral icterus.  Neck: Supple, trachea midline, normal ROM, no JVD,  Cardiovascular: RRR, S1 normal, S2 normal, no murmurs, gallops, or  rubs. Pulmonary/Chest: Clear to auscultation bilaterally, no wheezes, rales, or rhonchi. Abdominal: Soft, non-tender, non-distended, BS +, Extremities: No lower extremity edema bilaterally,  pulses symmetric and intact bilaterally. No cyanosis or clubbing.  Skin: Open wound on right AKA stem with purulent discharge. Psychiatric: Normal mood and affect. speech and behavior is normal.   Assessment & Plan:   See Encounters Tab for problem based charting.  Patient seen with Dr. Angelia Mould.

## 2019-05-05 NOTE — Progress Notes (Signed)
CRITICAL VALUE ALERT  Critical Value: MRSA Positive  Date & Time Notied:  5/27 2203  Provider Notified: Alfonse Spruce, MD

## 2019-05-05 NOTE — Assessment & Plan Note (Signed)
And is having a neutrophilic predominant leukocytosis and thrombocytosis. Urine dipstick shows few leukocytes and mild hematuria. Having a new wound on his right stamp with purulent discharge. Unable to see his decubitus ulcers as he was sitting in a wheelchair and difficult to move him to a bed as he was paraplegic.  -Patient was admitted to find source of infection and to treat it. -Send UA and urine culture. -Sent wound culture. -Blood culture. -Patient will be admitted to the hospital for further management and imaging.

## 2019-05-05 NOTE — Patient Instructions (Signed)
Shawn [name], Shawn [name] did well on checking your own blood sugar and doing an injection!  Try to eat 3 balanced meals each day spread out by 4-6 hours.   Take your blood sugar at least one time a day best in the moring.   Take your injection once a day - best about the same time each day.   Please bring your medicine and your meter to all visits.   Thank you!  Butch Penny [phone]

## 2019-05-05 NOTE — Progress Notes (Signed)
[name] is a 68 y.o. male patient admitted from ED awake, alert - oriented  X 4 - no acute distress noted.  VSS - Blood pressure 103/77, pulse 94, temperature 98.8 F (37.1 C), temperature source Oral, resp. rate 13, SpO2 98 %.    IV in place, occlusive dsg intact without redness.  Orientation to room, and floor completed with information packet given to patient/family.  Patient declined safety video at this time.  Admission INP armband ID verified with patient/family, and in place.   SR up x 2, fall assessment complete, with patient and family able to verbalize understanding of risk associated with falls, and verbalized understanding to call nsg before up out of bed.  Call light within reach, patient able to voice, and demonstrate understanding. Pt. Has right BKA, pressure injury to back and healing pressure injury to left heel.   Will cont to eval and treat per MD orders.  Tama High, RN 05/05/2019 6:44 PM

## 2019-05-05 NOTE — Assessment & Plan Note (Signed)
BP Readings from Last 3 Encounters:  05/05/19 108/70  02/11/19 (!) 142/87  12/04/18 136/81   His blood pressure was within goal today.  Complaining of mild dizziness with change in position lasting for couple of seconds.  -Continue with current management. -Reassess during next follow-up visit.
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
## 2019-05-05 NOTE — Progress Notes (Addendum)
Diabetes Self-Management Education  Visit Type: First/Initial  Appt. Start Time: 1145 Appt. End Time: 1225  05/05/2019  Mr. Matilde Bash, identified by name and date of birth, is a 68 y.o. male with a diagnosis of Diabetes: Type 2.   ASSESSMENT  Mr. Hidalgo reports a decubitus ulcer. He is a paraplegic with r AKA and newly diagnosed diabetes.  His glucose was 596 when he checked it on his new meter. He gave himself an injection of 10 units of Xultophy per order to demonstrate injection technique.  Tobacco cessation was encouraged for wound healing and diabetes control. He reports a family history of diabetes in his brother.   Diabetes care was coordinated with Dr. Reesa Chew and his nurse.   Lab Results  Component Value Date   HGBA1C >14.0 (A) 05/05/2019   Wt Readings from Last 5 Encounters:  05/05/19 173 lb 4.8 oz (78.6 kg)  02/11/19 185 lb (83.9 kg)  11/12/18 180 lb (81.6 kg)  08/27/18 180 lb (81.6 kg)  04/15/18 170 lb (77.1 kg)     Diabetes Self-Management Education - 05/05/19 1700      Visit Information   Visit Type  First/Initial      Initial Visit   Diabetes Type  Type 2    Are you currently following a meal plan?  No    Are you taking your medications as prescribed?  Yes    Date Diagnosed  today      Health Coping   How would you rate your overall health?  Fair      Psychosocial Assessment   Patient Belief/Attitude about Diabetes  Motivated to manage diabetes    Self-care barriers  Debilitated state due to current medical condition;Unsteady gait/risk for falls;Lack of material resources    Self-management support  Doctor's office;Family;CDE visits    Other persons present  Spouse/SO    Patient Concerns  Medication;Monitoring    Special Needs  None    Preferred Learning Style  No preference indicated    Learning Readiness  Ready    How often do you need to have someone help you when you read instructions, pamphlets, or other written materials from your doctor or  pharmacy?  2 - Rarely      Pre-Education Assessment   Patient understands the diabetes disease and treatment process.  Needs Instruction    Patient understands incorporating nutritional management into lifestyle.  Needs Instruction    Patient understands using medications safely.  Needs Instruction    Patient understands monitoring blood glucose, interpreting and using results  Needs Instruction    Patient understands prevention, detection, and treatment of acute complications.  Needs Instruction    Patient understands prevention, detection, and treatment of chronic complications.  Needs Instruction    Patient understands how to develop strategies to promote health/change behavior.  Needs Instruction      Complications   Last HgB A1C per patient/outside source  14 %      Patient Education   Previous Diabetes Education  No    Medications  Taught/reviewed insulin injection, site rotation, insulin storage and needle disposal.;Reviewed patients medication for diabetes, action, purpose, timing of dose and side effects.    Monitoring  Taught/evaluated SMBG meter.;Purpose and frequency of SMBG.      Individualized Goals (developed by patient)   Medications  take my medication as prescribed      Outcomes   Expected Outcomes  Demonstrated interest in learning. Expect positive outcomes    Future DMSE  2  wks    Program Status  Not Completed       Individualized Plan for Diabetes Self-Management Training:   Learning Objective:  Patient will have a greater understanding of diabetes self-management. Patient education plan is to attend individual and/or group sessions per assessed needs and concerns.   Plan:   There are no Patient Instructions on file for this visit.  Expected Outcomes:  Demonstrated interest in learning. Expect positive outcomes  Education material provided: Meal plan card  If problems or questions, patient to contact team via:  Phone  Future DSME appointment: 2 wks   Debera Lat, RD 05/05/2019 5:33 PM.

## 2019-05-05 NOTE — Assessment & Plan Note (Signed)
Due to his symptoms of polyuria and polydipsia with extreme fatigue and weight loss, he was tested for A1c and CBG which shows blood glucose above 600 and A1c above 14.  A new diagnosis of diabetes. Was given 2 L of normal saline with 10 units of NovoLog in clinic. Plan was to start him on Xultophy 10 units daily and metformin. Stat labs were done due to these lab abnormalities which shows neutrophilic predominant leukocytosis, although patient was afebrile but having new wounds with purulent discharge, which needs further investigation and most likely IV antibiotics. Patient was admitted for further investigation to finalize the source of infection.

## 2019-05-05 NOTE — Addendum Note (Signed)
Addended by: Resa Miner on: 05/05/2019 05:43 PM   Modules accepted: Orders

## 2019-05-05 NOTE — Patient Instructions (Addendum)
Thank you for visiting clinic today. We will check some labs and do a CT scan of your chest. Please keep yourself well-hydrated and continue drinking Ensure. This follow-up in 2 weeks for your results.

## 2019-05-06 ENCOUNTER — Inpatient Hospital Stay (HOSPITAL_COMMUNITY): Payer: Medicare Other

## 2019-05-06 ENCOUNTER — Other Ambulatory Visit: Payer: Self-pay | Admitting: Dietician

## 2019-05-06 DIAGNOSIS — N319 Neuromuscular dysfunction of bladder, unspecified: Secondary | ICD-10-CM

## 2019-05-06 DIAGNOSIS — I10 Essential (primary) hypertension: Secondary | ICD-10-CM

## 2019-05-06 DIAGNOSIS — T8189XA Other complications of procedures, not elsewhere classified, initial encounter: Secondary | ICD-10-CM

## 2019-05-06 DIAGNOSIS — L97919 Non-pressure chronic ulcer of unspecified part of right lower leg with unspecified severity: Secondary | ICD-10-CM | POA: Diagnosis present

## 2019-05-06 DIAGNOSIS — E876 Hypokalemia: Secondary | ICD-10-CM

## 2019-05-06 DIAGNOSIS — R8281 Pyuria: Secondary | ICD-10-CM

## 2019-05-06 DIAGNOSIS — L89154 Pressure ulcer of sacral region, stage 4: Secondary | ICD-10-CM

## 2019-05-06 DIAGNOSIS — Z89611 Acquired absence of right leg above knee: Secondary | ICD-10-CM

## 2019-05-06 DIAGNOSIS — Z79899 Other long term (current) drug therapy: Secondary | ICD-10-CM

## 2019-05-06 DIAGNOSIS — E119 Type 2 diabetes mellitus without complications: Secondary | ICD-10-CM

## 2019-05-06 DIAGNOSIS — G8220 Paraplegia, unspecified: Secondary | ICD-10-CM

## 2019-05-06 DIAGNOSIS — S24102S Unspecified injury at T2-T6 level of thoracic spinal cord, sequela: Secondary | ICD-10-CM

## 2019-05-06 DIAGNOSIS — E785 Hyperlipidemia, unspecified: Secondary | ICD-10-CM

## 2019-05-06 DIAGNOSIS — E11622 Type 2 diabetes mellitus with other skin ulcer: Secondary | ICD-10-CM

## 2019-05-06 DIAGNOSIS — M86659 Other chronic osteomyelitis, unspecified thigh: Secondary | ICD-10-CM | POA: Diagnosis present

## 2019-05-06 DIAGNOSIS — Z66 Do not resuscitate: Secondary | ICD-10-CM

## 2019-05-06 LAB — BASIC METABOLIC PANEL
Anion gap: 12 (ref 5–15)
Anion gap: 9 (ref 5–15)
BUN: 12 mg/dL (ref 8–23)
BUN: 12 mg/dL (ref 8–23)
CO2: 27 mmol/L (ref 22–32)
CO2: 24 mmol/L (ref 22–32)
Calcium: 8.8 mg/dL — ABNORMAL LOW (ref 8.9–10.3)
Calcium: 8 mg/dL — ABNORMAL LOW (ref 8.9–10.3)
Chloride: 93 mmol/L — ABNORMAL LOW (ref 98–111)
Chloride: 93 mmol/L — ABNORMAL LOW (ref 98–111)
Creatinine, Ser: 0.62 mg/dL (ref 0.61–1.24)
Creatinine, Ser: 0.73 mg/dL (ref 0.61–1.24)
GFR calc Af Amer: >60 mL/min (ref >60)
GFR calc Af Amer: >60 mL/min (ref >60)
GFR calc non Af Amer: >60 mL/min (ref >60)
GFR calc non Af Amer: >60 mL/min (ref >60)
Glucose, Bld: 87 mg/dL (ref 70–99)
Glucose, Bld: 351 mg/dL — ABNORMAL HIGH (ref 70–99)
Potassium: 2.6 mmol/L — CL (ref 3.5–5.1)
Potassium: 3.3 mmol/L — ABNORMAL LOW (ref 3.5–5.1)
Sodium: 132 mmol/L — ABNORMAL LOW (ref 135–145)
Sodium: 126 mmol/L — ABNORMAL LOW (ref 135–145)

## 2019-05-06 LAB — CBC
HCT: 38.5 % — ABNORMAL LOW (ref 39.0–52.0)
Hemoglobin: 13 g/dL (ref 13.0–17.0)
MCH: 23 pg — ABNORMAL LOW (ref 26.0–34.0)
MCHC: 33.8 g/dL (ref 30.0–36.0)
MCV: 68.1 fL — ABNORMAL LOW (ref 80.0–100.0)
Platelets: 382 10*3/uL (ref 150–400)
RBC: 5.65 MIL/uL (ref 4.22–5.81)
RDW: 19.9 % — ABNORMAL HIGH (ref 11.5–15.5)
WBC: 13.3 10*3/uL — ABNORMAL HIGH (ref 4.0–10.5)
nRBC: 0 % (ref 0.0–0.2)

## 2019-05-06 LAB — URINALYSIS, ROUTINE W REFLEX MICROSCOPIC
Bilirubin Urine: NEGATIVE
Glucose, UA: >=500 mg/dL — AB
Ketones, ur: 5 mg/dL — AB
Nitrite: POSITIVE — AB
Protein, ur: NEGATIVE mg/dL
Specific Gravity, Urine: 1.018 (ref 1.005–1.030)
pH: 9 — ABNORMAL HIGH (ref 5.0–8.0)

## 2019-05-06 LAB — GLUCOSE, CAPILLARY
Glucose-Capillary: 118 mg/dL — ABNORMAL HIGH (ref 70–99)
Glucose-Capillary: 150 mg/dL — ABNORMAL HIGH (ref 70–99)
Glucose-Capillary: 150 mg/dL — ABNORMAL HIGH (ref 70–99)
Glucose-Capillary: 162 mg/dL — ABNORMAL HIGH (ref 70–99)
Glucose-Capillary: 176 mg/dL — ABNORMAL HIGH (ref 70–99)
Glucose-Capillary: 178 mg/dL — ABNORMAL HIGH (ref 70–99)
Glucose-Capillary: 179 mg/dL — ABNORMAL HIGH (ref 70–99)
Glucose-Capillary: 195 mg/dL — ABNORMAL HIGH (ref 70–99)
Glucose-Capillary: 236 mg/dL — ABNORMAL HIGH (ref 70–99)
Glucose-Capillary: 253 mg/dL — ABNORMAL HIGH (ref 70–99)
Glucose-Capillary: 279 mg/dL — ABNORMAL HIGH (ref 70–99)
Glucose-Capillary: 329 mg/dL — ABNORMAL HIGH (ref 70–99)
Glucose-Capillary: 380 mg/dL — ABNORMAL HIGH (ref 70–99)

## 2019-05-06 LAB — HIV ANTIBODY (ROUTINE TESTING W REFLEX): HIV Screen 4th Generation wRfx: NONREACTIVE

## 2019-05-06 MED ORDER — CHLORHEXIDINE GLUCONATE CLOTH 2 % EX PADS
6.0000 | MEDICATED_PAD | Freq: Every day | CUTANEOUS | Status: DC
  Administered 2019-05-06 – 2019-05-07 (×2): 6 via TOPICAL

## 2019-05-06 MED ORDER — POTASSIUM CHLORIDE 10 MEQ/100ML IV SOLN
10.0000 meq | INTRAVENOUS | Status: DC
  Administered 2019-05-06 (×2): 10 meq via INTRAVENOUS
  Filled 2019-05-06 (×3): qty 100

## 2019-05-06 MED ORDER — POTASSIUM CHLORIDE CRYS ER 20 MEQ PO TBCR
40.0000 meq | EXTENDED_RELEASE_TABLET | Freq: Once | ORAL | Status: AC
  Administered 2019-05-06: 40 meq via ORAL
  Filled 2019-05-06: qty 2

## 2019-05-06 MED ORDER — INSULIN ASPART 100 UNIT/ML ~~LOC~~ SOLN
0.0000 [IU] | Freq: Every day | SUBCUTANEOUS | Status: DC
  Administered 2019-05-06: 5 [IU] via SUBCUTANEOUS

## 2019-05-06 MED ORDER — INSULIN GLARGINE 100 UNIT/ML ~~LOC~~ SOLN: 20.0000 [IU] | Freq: Every day | SUBCUTANEOUS | Status: DC

## 2019-05-06 MED ORDER — POTASSIUM CHLORIDE 10 MEQ/100ML IV SOLN
10.0000 meq | INTRAVENOUS | Status: AC
  Administered 2019-05-06 (×4): 10 meq via INTRAVENOUS
  Filled 2019-05-06 (×3): qty 100

## 2019-05-06 MED ORDER — METHOCARBAMOL 500 MG PO TABS
500.0000 mg | ORAL_TABLET | Freq: Two times a day (BID) | ORAL | Status: DC | PRN
  Administered 2019-05-06: 11:00:00 500 mg via ORAL
  Filled 2019-05-06: qty 1

## 2019-05-06 MED ORDER — INSULIN PEN NEEDLE 32G X 4 MM MISC: 3 refills | Status: DC

## 2019-05-06 MED ORDER — GABAPENTIN 300 MG PO CAPS
300.0000 mg | ORAL_CAPSULE | Freq: Two times a day (BID) | ORAL | Status: DC
  Administered 2019-05-06 – 2019-05-07 (×3): 300 mg via ORAL
  Filled 2019-05-06 (×3): qty 1

## 2019-05-06 MED ORDER — ENSURE MAX PROTEIN PO LIQD
11.0000 [oz_av] | Freq: Two times a day (BID) | ORAL | Status: DC
  Administered 2019-05-06 – 2019-05-07 (×2): 11 [oz_av] via ORAL
  Filled 2019-05-06 (×4): qty 330

## 2019-05-06 MED ORDER — SODIUM CHLORIDE 0.9 % IV SOLN: 2.0000 g | Freq: Three times a day (TID) | INTRAVENOUS | Status: DC

## 2019-05-06 MED ORDER — LIVING WELL WITH DIABETES BOOK
Freq: Once | Status: AC
  Administered 2019-05-06: 18:00:00
  Filled 2019-05-06: qty 1

## 2019-05-06 MED ORDER — INSULIN GLARGINE 100 UNIT/ML ~~LOC~~ SOLN
20.0000 [IU] | Freq: Every day | SUBCUTANEOUS | Status: DC
  Administered 2019-05-06 – 2019-05-07 (×2): 20 [IU] via SUBCUTANEOUS
  Filled 2019-05-06 (×2): qty 0.2

## 2019-05-06 MED ORDER — BAYER MICROLET LANCETS MISC: 12 refills | Status: DC

## 2019-05-06 MED ORDER — INSULIN ASPART 100 UNIT/ML ~~LOC~~ SOLN
6.0000 [IU] | Freq: Three times a day (TID) | SUBCUTANEOUS | Status: DC
  Administered 2019-05-06 – 2019-05-07 (×6): 6 [IU] via SUBCUTANEOUS

## 2019-05-06 MED ORDER — MUPIROCIN 2 % EX OINT
1.0000 "application " | TOPICAL_OINTMENT | Freq: Two times a day (BID) | CUTANEOUS | Status: DC
  Administered 2019-05-06 – 2019-05-07 (×3): 1 via NASAL
  Filled 2019-05-06: qty 22

## 2019-05-06 MED ORDER — INSULIN STARTER KIT- PEN NEEDLES (ENGLISH)
1.0000 | Freq: Once | Status: AC
  Administered 2019-05-06: 18:00:00 1
  Filled 2019-05-06: qty 1

## 2019-05-06 MED ORDER — VANCOMYCIN HCL IN DEXTROSE 1-5 GM/200ML-% IV SOLN
1000.0000 mg | Freq: Two times a day (BID) | INTRAVENOUS | Status: DC
  Administered 2019-05-06: 13:00:00 1000 mg via INTRAVENOUS
  Filled 2019-05-06: qty 200

## 2019-05-06 MED ORDER — INSULIN ASPART 100 UNIT/ML ~~LOC~~ SOLN
0.0000 [IU] | Freq: Three times a day (TID) | SUBCUTANEOUS | Status: DC
  Administered 2019-05-06: 19:00:00 8 [IU] via SUBCUTANEOUS
  Administered 2019-05-06 (×2): 3 [IU] via SUBCUTANEOUS
  Administered 2019-05-07: 8 [IU] via SUBCUTANEOUS
  Administered 2019-05-07: 3 [IU] via SUBCUTANEOUS
  Administered 2019-05-07: 12:00:00 8 [IU] via SUBCUTANEOUS

## 2019-05-06 MED ORDER — VANCOMYCIN HCL 10 G IV SOLR
1250.0000 mg | Freq: Two times a day (BID) | INTRAVENOUS | Status: DC
  Filled 2019-05-06: qty 1250

## 2019-05-06 MED ORDER — INSULIN ASPART 100 UNIT/ML ~~LOC~~ SOLN: 3.0000 [IU] | Freq: Three times a day (TID) | SUBCUTANEOUS | Status: DC

## 2019-05-06 MED ORDER — INSULIN GLARGINE 100 UNIT/ML ~~LOC~~ SOLN
20.0000 [IU] | Freq: Every day | SUBCUTANEOUS | Status: DC
  Filled 2019-05-06: qty 0.2

## 2019-05-06 MED ORDER — AMLODIPINE BESYLATE 10 MG PO TABS
10.0000 mg | ORAL_TABLET | Freq: Every day | ORAL | Status: DC
  Administered 2019-05-06 – 2019-05-07 (×2): 10 mg via ORAL
  Filled 2019-05-06 (×2): qty 1

## 2019-05-06 MED ORDER — ADULT MULTIVITAMIN W/MINERALS CH
1.0000 | ORAL_TABLET | Freq: Every day | ORAL | Status: DC
  Administered 2019-05-06 – 2019-05-07 (×2): 1 via ORAL
  Filled 2019-05-06 (×2): qty 1

## 2019-05-06 MED ORDER — GLUCOSE BLOOD VI STRP: ORAL_STRIP | 12 refills | Status: DC
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
## 2019-05-06 NOTE — Progress Notes (Addendum)
Inpatient Diabetes Program Recommendations  AACE/ADA: New Consensus Statement on Inpatient Glycemic Control (2015)  Target Ranges:  Prepandial:   less than 140 mg/dL      Peak postprandial:   less than 180 mg/dL (1-2 hours)      Critically ill patients:  140 - 180 mg/dL   Lab Results  Component Value Date   GLUCAP 179 (H) 05/06/2019   HGBA1C >14.0 (A) 05/05/2019    Review of Glycemic Control Results for Shawn [name], Shawn [name] (MRN [id number]) as of 05/06/2019 08:51  Ref. Range 05/06/2019 06:44 05/06/2019 08:16 05/06/2019 08:20  Glucose-Capillary Latest Ref Range: 70 - 99 mg/dL 162 (H) 176 (H) 179 (H)   Diabetes history: New onset Outpatient Diabetes medications: none Current orders for Inpatient glycemic control: Lantus 20 units QD, Novolog 6 units TID, Novolog 0-15 units TID, Novolog 0-5 units QHS  Inpatient Diabetes Program Recommendations:    Patient transitioning off glucostabilizer. Anticipate may be slightly elevated as glucostabilizer was stopped prior to receiving basal insulin. In agreement with current orders.  Noted new onset diabetes. Will plan to speak with patient today.   Addendum'@1600'$ : Spoke with patient and wife regarding new onset diabetes. Patient has experienced fatigue and weight loss recently. Has already been self administering insulin while inpatient.  Reviewed patient's current A1c of >14%. Explained what a A1c is and what it measures. Also reviewed goal A1c with patient, importance of good glucose control @ home, and blood sugar goals. Reviewed patho of DM, need for insulin, role of pancreas, signs and symptoms of hypo vs hyper glycemia, survival skills, vascular changes and comorbidites.  Patient has received a meter. However, there was a note for RD in office to order supplies. Unsure of brand of glucometer. Please order at discharge. Encouraged patient and wife to begin checking 2-4 times per day, writing values down and when to call MD.  Ladora Daniel, outpatient  referral and insulin starter kit. Dietitian has spoken with patient and family. Reviewed carb counting, plate method and being mindful of sugar intake. Patient to follow up with PCP in 2 weeks. Has no further questions at this time. Plan to see on 5/29 to ensure injections are going well. Plan for insulin pens and pen needles.    Thanks, Bronson Curb, MSN, RNC-OB Diabetes Coordinator [phone] (8a-5p)

## 2019-05-06 NOTE — Progress Notes (Signed)
Pharmacy Antibiotic Note  Shawn [name]. is a 68 y.o. male admitted on 05/05/2019 with R stump infection and newly diagnosed DM. Pharmacy was consulted on 05/05/19 for vancomycin and aztreonam dosing. Cr 1.07, unclear baseline,SCr today dropped to 0.62. Pt has hx of paraplegia and R BKA, so will be conservative with vancomycin dosing.  Will round SCR up to 0.8 to re-calculate Vanc dosing and expected AUC.  UOP = 1145ml    Plan: Change Vancomycin to 1000 mg IV q12h , recalculated AUC =416, used SCr 0.8  (rounded up from actual SCr 0.62;  down from initial SCr 1.07) , conservative dosing due to R BKA and paraplegia. Aztreonam 2g IV q8h Flagyl 500mg  IV q8h per MD     Temp (24hrs), Avg:98.4 F (36.9 C), Min:98 F (36.7 C), Max:98.8 F (37.1 C)  Recent Labs  Lab 05/05/19 1117 05/06/19 0422 05/06/19 0807  WBC 13.4*  --  13.3*  CREATININE 1.07 0.62  --     Estimated Creatinine Clearance: 96.9 mL/min (by C-G formula based on SCr of 0.62 mg/dL).    Allergies  Allergen Reactions  . Penicillins Hives    Has patient had a PCN reaction causing immediate rash, facial/tongue/throat swelling, SOB or lightheadedness with hypotension: Yes Has patient had a PCN reaction causing severe rash involving mucus membranes or skin necrosis: Unknown Has patient had a PCN reaction that required hospitalization: No Has patient had a PCN reaction occurring within the last 10 years: Yes If all of the above answers are "NO", then may proceed with Cephalosporin use.   . Statins     Uncontrolled diarrhea    Antimicrobials this admission: Vancomycin 5/27 >> Metronidazole 5/27 >> Aztreonam 5/27 >>   Microbiology results: N/a   Thank you for allowing pharmacy to be a part of this patient's care.  Nicole Cella, RPh Clinical Pharmacist Pager: [phone] Clinical Pharmacist Please check AMION for all [place] numbers 05/06/2019

## 2019-05-06 NOTE — Progress Notes (Signed)
Spoke with pts. Wife who stated pt. Was to have suprapubic catheter replaced yesterday at 2:30 appointment. Notifieded by MD who spoke with Urology stating it would be okay to stay in until he is discharged. Will continue to monitor.

## 2019-05-06 NOTE — Progress Notes (Signed)
CRITICAL VALUE ALERT  Critical Value: K 2.6  Date & Time Notied:  5/28 0705  Provider Notified: Eileen Stanford, MD  Orders Received/Actions taken: Will order IV replacement
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
## 2019-05-06 NOTE — Telephone Encounter (Signed)
Needs supplies for glucometer sample given to him in office yesterday and insulin/injectable administration.
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
## 2019-05-07 ENCOUNTER — Ambulatory Visit (HOSPITAL_COMMUNITY): Admission: RE | Admit: 2019-05-07 | Payer: PRIVATE HEALTH INSURANCE | Source: Ambulatory Visit

## 2019-05-07 ENCOUNTER — Other Ambulatory Visit: Payer: Self-pay

## 2019-05-07 DIAGNOSIS — Z880 Allergy status to penicillin: Secondary | ICD-10-CM

## 2019-05-07 DIAGNOSIS — M86151 Other acute osteomyelitis, right femur: Secondary | ICD-10-CM

## 2019-05-07 DIAGNOSIS — Z888 Allergy status to other drugs, medicaments and biological substances status: Secondary | ICD-10-CM

## 2019-05-07 DIAGNOSIS — E1169 Type 2 diabetes mellitus with other specified complication: Secondary | ICD-10-CM

## 2019-05-07 DIAGNOSIS — Z794 Long term (current) use of insulin: Secondary | ICD-10-CM

## 2019-05-07 DIAGNOSIS — E1110 Type 2 diabetes mellitus with ketoacidosis without coma: Principal | ICD-10-CM

## 2019-05-07 DIAGNOSIS — M86651 Other chronic osteomyelitis, right thigh: Secondary | ICD-10-CM

## 2019-05-07 LAB — URINE CULTURE: Culture: >=100000 — AB

## 2019-05-07 LAB — BASIC METABOLIC PANEL
Anion gap: 9 (ref 5–15)
BUN: 11 mg/dL (ref 8–23)
CO2: 23 mmol/L (ref 22–32)
Calcium: 8.2 mg/dL — ABNORMAL LOW (ref 8.9–10.3)
Chloride: 94 mmol/L — ABNORMAL LOW (ref 98–111)
Creatinine, Ser: 0.64 mg/dL (ref 0.61–1.24)
GFR calc Af Amer: >60 mL/min (ref >60)
GFR calc non Af Amer: >60 mL/min (ref >60)
Glucose, Bld: 311 mg/dL — ABNORMAL HIGH (ref 70–99)
Potassium: 3.5 mmol/L (ref 3.5–5.1)
Sodium: 126 mmol/L — ABNORMAL LOW (ref 135–145)

## 2019-05-07 LAB — GLUCOSE, CAPILLARY
Glucose-Capillary: 278 mg/dL — ABNORMAL HIGH (ref 70–99)
Glucose-Capillary: 289 mg/dL — ABNORMAL HIGH (ref 70–99)
Glucose-Capillary: 188 mg/dL — ABNORMAL HIGH (ref 70–99)

## 2019-05-07 MED ORDER — INSULIN ASPART 100 UNIT/ML ~~LOC~~ SOLN: 10.0000 [IU] | Freq: Three times a day (TID) | SUBCUTANEOUS | 11 refills | Status: DC

## 2019-05-07 MED ORDER — INSULIN GLARGINE 100 UNIT/ML ~~LOC~~ SOLN: 26.0000 [IU] | Freq: Every day | SUBCUTANEOUS | 11 refills | Status: DC

## 2019-05-07 MED ORDER — INSULIN ASPART 100 UNIT/ML FLEXPEN: 10.0000 [IU] | PEN_INJECTOR | Freq: Three times a day (TID) | SUBCUTANEOUS | 11 refills | Status: DC

## 2019-05-07 MED ORDER — INSULIN GLARGINE 100 UNIT/ML SOLOSTAR PEN: 26.0000 [IU] | PEN_INJECTOR | Freq: Every day | SUBCUTANEOUS | 11 refills | Status: DC

## 2019-05-07 MED FILL — HUMALOG 100 UNITS/ML KWIKPE: 100 | 30 days supply | Qty: 9 | Fill #0

## 2019-05-07 MED FILL — PENTIPS 31G X 8 MM MISC: 31G X 8 MM | 25 days supply | Qty: 100 | Fill #0

## 2019-05-07 MED FILL — LANTUS SOLOSTAR 100 UNITS/M: 100 | 30 days supply | Qty: 9 | Fill #0

## 2019-05-07 NOTE — Progress Notes (Addendum)
Patient was discharged home by MD order; discharged instructions review and give to patient with care notes; Insulin was delivered by pharmacy in the patient's room; IV DIC; dressing on the sacrum and left knee was changed before patient was D/C; patient will be escorted to the car by nurse tech via wheelchair.

## 2019-05-07 NOTE — Progress Notes (Signed)
Briefly spoke with patient regarding d/c home on insulin.  He was taught how to administer insulin by CDE at internal medicine clinic- [facility], CDE. We reviewed signs and symptoms of low blood sugar and how to treat with 15 grams of CHO (4 oz of Juice/regular soda).  Patient was able to teach back and states that wife has "written everything down" and is ready.  No further questions at this time.   Thanks  Adah Perl, RN, BC-ADM Inpatient Diabetes Coordinator Pager [phone]

## 2019-05-07 NOTE — Progress Notes (Signed)
Patient's wife was called by staff twice and informed that patient is discharged and family must come and pick him up. Per patient's wife, family will be here late this afternoon, due to transportation needs. Will continue to monitor.
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
## 2019-05-07 NOTE — Progress Notes (Signed)
Inpatient Diabetes Program Recommendations  AACE/ADA: New Consensus Statement on Inpatient Glycemic Control (2015)  Target Ranges:  Prepandial:   less than 140 mg/dL      Peak postprandial:   less than 180 mg/dL (1-2 hours)      Critically ill patients:  140 - 180 mg/dL   Lab Results  Component Value Date   GLUCAP 278 (H) 05/07/2019   HGBA1C >14.0 (A) 05/05/2019    Review of Glycemic Control Results for Shawn [name], Shawn [name] (MRN [id number]) as of 05/07/2019 09:38  Ref. Range 05/06/2019 16:34 05/06/2019 17:48 05/06/2019 21:31 05/07/2019 08:11  Glucose-Capillary Latest Ref Range: 70 - 99 mg/dL 329 (H) 279 (H) 380 (H) 278 (H)   Diabetes history: New onset Outpatient Diabetes medications: none Current orders for Inpatient glycemic control: Lantus 20 units QD, Novolog 6 units TID, Novolog 0-15 units TID, Novolog 0-5 units QHS  Inpatient Diabetes Program Recommendations:    Consider increasing Lantus to 26 units QD and increasing meal coverage to Novolog 10 units TID (assuming patient is consuming >50% of meal).   Thanks, Bronson Curb, MSN, RNC-OB Diabetes Coordinator [phone] (8a-5p)

## 2019-05-07 NOTE — Progress Notes (Signed)
Patient was escorted to the main entrance by staff and left with his wife.

## 2019-05-07 NOTE — Care Management Important Message (Signed)
Important Message  Patient Details  Name: Shawn [name]. MRN: [id number] Date of Birth: October 25, 1951   Medicare Important Message Given:  Yes    Dericka Ostenson 05/07/2019, 3:19 PM

## 2019-05-07 NOTE — Progress Notes (Signed)
Patient with order to D/C at home. Waiting for pharmacy to deliver his medicine.

## 2019-05-07 NOTE — TOC Transition Note (Signed)
Transition of Care [facility]) - CM/SW Discharge Note   Patient Details  Name: Kristopher Attwood. MRN: [id number] Date of Birth: [birth year]  Transition of Care [facility]) CM/SW Contact:  Sharin Mons, RN Phone Number: 05/07/2019, 2:04 PM   Clinical Narrative:   Admitted with diabetic ketosis, hx of paraplegia, chronic and severe sacral pressure ulcer, supra pubic catheter. From home with supportive wife. Pt is wheelchair bound.   Pt will transition to home today with home health services (RN). [facility] will delivered Rx meds to bedside prior to d/c. Wife to provide transportation to home.   Final next level of care: [place] Barriers to Discharge: Barriers Resolved   Patient Goals and CMS Choice Patient states their goals for this hospitalization and ongoing recovery are:: get my sugar better CMS Medicare.gov Compare Post Acute Care list provided to:: Patient Choice offered to / list presented to : Patient  Discharge Placement                       Discharge Plan and Services   Discharge Planning Services: CM Consult Post Acute Care Choice: NA          DME Arranged: N/A DME Agency: NA     Representative spoke with at DME Agency:   [place]: RN [place]: [place] ([place]) Date [place]: 05/06/19 Time [place]: [place] Representative spoke with at [place]: Rolm Bookbinder  Social Determinants of Health ([place]) Interventions     Readmission Risk Interventions No flowsheet data found.

## 2019-05-09 ENCOUNTER — Other Ambulatory Visit: Payer: Self-pay | Admitting: Internal Medicine

## 2019-05-09 DIAGNOSIS — M8638 Chronic multifocal osteomyelitis, other site: Secondary | ICD-10-CM | POA: Diagnosis not present

## 2019-05-09 DIAGNOSIS — M86151 Other acute osteomyelitis, right femur: Secondary | ICD-10-CM | POA: Diagnosis not present

## 2019-05-09 DIAGNOSIS — E1110 Type 2 diabetes mellitus with ketoacidosis without coma: Secondary | ICD-10-CM | POA: Diagnosis not present

## 2019-05-09 DIAGNOSIS — E1169 Type 2 diabetes mellitus with other specified complication: Secondary | ICD-10-CM | POA: Diagnosis not present

## 2019-05-09 DIAGNOSIS — L89322 Pressure ulcer of left buttock, stage 2: Secondary | ICD-10-CM | POA: Diagnosis not present

## 2019-05-09 DIAGNOSIS — Z5181 Encounter for therapeutic drug level monitoring: Secondary | ICD-10-CM | POA: Diagnosis not present

## 2019-05-09 DIAGNOSIS — Z89611 Acquired absence of right leg above knee: Secondary | ICD-10-CM | POA: Diagnosis not present

## 2019-05-09 DIAGNOSIS — Z794 Long term (current) use of insulin: Secondary | ICD-10-CM | POA: Diagnosis not present

## 2019-05-09 DIAGNOSIS — G8220 Paraplegia, unspecified: Secondary | ICD-10-CM | POA: Diagnosis not present

## 2019-05-09 DIAGNOSIS — L89154 Pressure ulcer of sacral region, stage 4: Secondary | ICD-10-CM | POA: Diagnosis not present

## 2019-05-09 DIAGNOSIS — Z435 Encounter for attention to cystostomy: Secondary | ICD-10-CM | POA: Diagnosis not present

## 2019-05-09 DIAGNOSIS — Z4800 Encounter for change or removal of nonsurgical wound dressing: Secondary | ICD-10-CM | POA: Diagnosis not present

## 2019-05-10 LAB — AEROBIC CULTURE W GRAM STAIN (SUPERFICIAL SPECIMEN)

## 2019-05-10 LAB — CULTURE, BLOOD (SINGLE)
Culture: NO GROWTH
Culture: NO GROWTH
Special Requests: ADEQUATE
Special Requests: ADEQUATE

## 2019-05-11 DIAGNOSIS — N312 Flaccid neuropathic bladder, not elsewhere classified: Secondary | ICD-10-CM | POA: Diagnosis not present

## 2019-05-11 NOTE — Progress Notes (Signed)
Internal Medicine Clinic Attending  I saw and evaluated the patient.  I personally confirmed the key portions of the history and exam documented by Dr. Amin and I reviewed pertinent patient test results.  The assessment, diagnosis, and plan were formulated together and I agree with the documentation in the resident's note. 

## 2019-05-12 ENCOUNTER — Telehealth (HOSPITAL_COMMUNITY): Payer: Self-pay

## 2019-05-12 DIAGNOSIS — G8220 Paraplegia, unspecified: Secondary | ICD-10-CM | POA: Diagnosis not present

## 2019-05-12 DIAGNOSIS — L89313 Pressure ulcer of right buttock, stage 3: Secondary | ICD-10-CM | POA: Diagnosis not present

## 2019-05-12 NOTE — Telephone Encounter (Signed)
Follow up phone call post discharge home with [facility] services. No answer. Left a message and phone number for pt/wife to return call back.  Cletis Media RN BSN CWS [place]

## 2019-05-12 NOTE — Telephone Encounter (Signed)
Wife returned call. States she and patient are doing well. They are at [facility] presently for follow up. They deny questions or barriers at this time. Wife voices that she has PV Navigator contact information should questions or issues arise.  Cletis Media RN BSN CWS [place]

## 2019-05-17 ENCOUNTER — Encounter: Payer: Medicare Other | Attending: Physical Medicine & Rehabilitation | Admitting: Physical Medicine & Rehabilitation

## 2019-05-17 ENCOUNTER — Encounter: Payer: Self-pay | Admitting: Physical Medicine & Rehabilitation

## 2019-05-17 ENCOUNTER — Other Ambulatory Visit: Payer: Self-pay

## 2019-05-17 VITALS — BP 127/80 | Ht 73.5 in | Wt 174.0 lb

## 2019-05-17 DIAGNOSIS — Z823 Family history of stroke: Secondary | ICD-10-CM | POA: Insufficient documentation

## 2019-05-17 DIAGNOSIS — R269 Unspecified abnormalities of gait and mobility: Secondary | ICD-10-CM | POA: Insufficient documentation

## 2019-05-17 DIAGNOSIS — S34109S Unspecified injury to unspecified level of lumbar spinal cord, sequela: Secondary | ICD-10-CM | POA: Insufficient documentation

## 2019-05-17 DIAGNOSIS — G8220 Paraplegia, unspecified: Secondary | ICD-10-CM | POA: Insufficient documentation

## 2019-05-17 DIAGNOSIS — Z79899 Other long term (current) drug therapy: Secondary | ICD-10-CM | POA: Insufficient documentation

## 2019-05-17 DIAGNOSIS — M79604 Pain in right leg: Secondary | ICD-10-CM | POA: Insufficient documentation

## 2019-05-17 DIAGNOSIS — F1010 Alcohol abuse, uncomplicated: Secondary | ICD-10-CM | POA: Insufficient documentation

## 2019-05-17 DIAGNOSIS — I10 Essential (primary) hypertension: Secondary | ICD-10-CM | POA: Insufficient documentation

## 2019-05-17 DIAGNOSIS — G479 Sleep disorder, unspecified: Secondary | ICD-10-CM | POA: Insufficient documentation

## 2019-05-17 DIAGNOSIS — G894 Chronic pain syndrome: Secondary | ICD-10-CM | POA: Diagnosis not present

## 2019-05-17 DIAGNOSIS — Z720 Tobacco use: Secondary | ICD-10-CM

## 2019-05-17 DIAGNOSIS — Z89611 Acquired absence of right leg above knee: Secondary | ICD-10-CM | POA: Insufficient documentation

## 2019-05-17 DIAGNOSIS — F17210 Nicotine dependence, cigarettes, uncomplicated: Secondary | ICD-10-CM | POA: Insufficient documentation

## 2019-05-17 DIAGNOSIS — Z8249 Family history of ischemic heart disease and other diseases of the circulatory system: Secondary | ICD-10-CM | POA: Insufficient documentation

## 2019-05-17 DIAGNOSIS — Z87828 Personal history of other (healed) physical injury and trauma: Secondary | ICD-10-CM

## 2019-05-17 DIAGNOSIS — Z841 Family history of disorders of kidney and ureter: Secondary | ICD-10-CM | POA: Insufficient documentation

## 2019-05-17 DIAGNOSIS — Z833 Family history of diabetes mellitus: Secondary | ICD-10-CM | POA: Insufficient documentation

## 2019-05-17 NOTE — Progress Notes (Signed)
Subjective:    Patient ID: [name], male    DOB: 08/10/1951, 68 y.o.   MRN: [id number]  TELEHEALTH NOTE  Due to national recommendations of social distancing due to COVID 19, an audio/video telehealth visit is felt to be most appropriate for this patient at this time.  See Chart message from today for the patient's consent to telehealth from [place].     I verified that I am speaking with the correct person using two identifiers.  Location of patient: Home Location of provider: Office Method of communication: Telephone Names of participants : Zorita Pang scheduling, Marland Mcalpine obtaining consent and vitals if available Established patient Time spent on call: 12 minutes  HPI 68 y/o male with pmh/psh of paraplegia, Etoh abuse, HTN, L-spine surgery, flap present for follow up of pain, now with right AKA.  Initially stated: Presented since ~1995.  Exacerbated since fracture in 2007. Stable since then.  Narcotics improve the pain.  Cold exacerbate the pain.  Achy.  Non-radiating.  He has sensation to his knees.  Intermittent.  Ice/heat do not help.  Denies falls. Pain makes things uncomfortable.  Last clinic visit 02/11/2019. Since last visit, patient went to the hospital for newly diagnosed diabetes with ketoacidosis, notes reviewed, discussed with patient. He continues to follow up with wound care, notes drainage has decreased. Denies falls. He states he has to be evaluated for potential further surgery due to ?infection in stump. He state he lost 15 lbs. He states he is smoking 2 cig/day now.    Pain Inventory Average Pain 5 Pain Right Now 0 My pain is intermittent, dull and aching  In the last 24 hours, has pain interfered with the following? General activity 7 Relation with others 5 Enjoyment of life 4 What TIME of day is your pain at its worst? daytime Sleep (in general) Good  Pain is worse with: walking, standing and cold temp  Pain improves with: heat/ice and medication Relief from Meds: 4  Mobility ability to climb steps?  no do you drive?  no use a wheelchair needs help with transfers  Function disabled: date disabled 54 retired  Neuro/Psych No problems in this area  Prior Studies Any changes since last visit?  no  Physicians involved in your care Any changes since last visit?  no   Family History  Problem Relation Age of Onset  . Stroke Mother   . Coronary artery disease Father   . Coronary artery disease Paternal Uncle   . Coronary artery disease Paternal Aunt    Social History   Socioeconomic History  . Marital status: Married    Spouse name: Not on file  . Number of children: 1  . Years of education: Not on file  . Highest education level: Not on file  Occupational History  . Occupation: DISABILITY    Employer: UNEMPLOYED  Social Needs  . Financial resource strain: Not on file  . Food insecurity:    Worry: Not on file    Inability: Not on file  . Transportation needs:    Medical: Not on file    Non-medical: Not on file  Tobacco Use  . Smoking status: Current Every Day Smoker    Packs/day: 0.50    Years: 40.00    Pack years: 20.00    Types: Cigarettes  . Smokeless tobacco: Never Used  . Tobacco comment: 1 pack every 2 -3 days  Substance and Sexual Activity  . Alcohol  use: No    Alcohol/week: 0.0 standard drinks  . Drug use: No  . Sexual activity: Not on file  Lifestyle  . Physical activity:    Days per week: Not on file    Minutes per session: Not on file  . Stress: Not on file  Relationships  . Social connections:    Talks on phone: Not on file    Gets together: Not on file    Attends religious service: Not on file    Active member of club or organization: Not on file    Attends meetings of clubs or organizations: Not on file    Relationship status: Not on file  Other Topics Concern  . Not on file  Social History Narrative   Married, disabled, medicaid,  functions independently at home, paraplegic      Family hx of : hypertension, diabetes, kidney disease, and other cancer   Past Surgical History:  Procedure Laterality Date  . BONE BIOPSY  11/2007   negative for organisms  . COLONOSCOPY WITH PROPOFOL N/A 08/27/2017   Procedure: COLONOSCOPY WITH PROPOFOL;  Surgeon: Irene Shipper, MD;  Location: [facility] ENDOSCOPY;  Service: Endoscopy;  Laterality: N/A;  . sacral wound flap  2002   done by Dr. Stephanie Coup  . SPINE SURGERY    . THROAT SURGERY     Past Medical History:  Diagnosis Date  . Allergy   . Arthritis   . C. difficile colitis 11/2008  . Decubitus ulcer 1999   excision of right ischial pressure sore with flap reconstruction done by Dr. Denese Killings 10/31/2008  . GERD (gastroesophageal reflux disease)   . Hyperlipidemia   . Hypertension   . Paraplegia ([place])    secondary to [place]  . Perineal abscess   . Substance abuse ([place])    alcohol   BP 127/80   Ht 6' 1.5" (1.867 m)   Wt 174 lb (78.9 kg)   BMI 22.65 kg/m   Opioid Risk Score:   Fall Risk Score:  `1  Depression screen PHQ 2/9  Depression screen [facility] 2/9 05/05/2019 11/16/2018 08/27/2018 08/13/2018 03/04/2018 11/13/2017 10/15/2017  Decreased Interest 3 2 1  0 0 0 0  Down, Depressed, Hopeless 2 0 0 0 0 0 0  PHQ - 2 Score 5 2 1  0 0 0 0  Altered sleeping 3 3 1  - - - -  Tired, decreased energy 3 1 2  - - - -  Change in appetite 2 1 1  - - - -  Feeling bad or failure about yourself  0 0 0 - - - -  Trouble concentrating 3 0 1 - - - -  Moving slowly or fidgety/restless 3 2 1  - - - -  Suicidal thoughts 0 0 0 - - - -  PHQ-9 Score 19 9 7  - - - -  Difficult doing work/chores Somewhat difficult Not difficult at all Not difficult at all - - - -  Some recent data might be hidden    Review of Systems  Constitutional: Negative.   HENT: Negative.   Eyes: Negative.   Respiratory: Negative.   Cardiovascular: Negative.   Gastrointestinal: Negative.   Endocrine: Negative.   Genitourinary:  Negative.   Musculoskeletal: Positive for arthralgias, gait problem and myalgias.  Skin: Negative.   Allergic/Immunologic: Negative.   Neurological: Positive for weakness and numbness.  Hematological: Negative.   Psychiatric/Behavioral: Negative.   All other systems reviewed and are negative.     Objective:   Physical Exam Gen:  NAD. Vital signs reviewed Pulm: Effort normal. Neuro: Alert    Assessment & Plan:  68 y/o male with pmh/psh of paraplegia secondary to lumbar SCI, Etoh abuse, HTN, L-spine surgery, flap present for follow up for pain, now with right AKA.  1. Chronic right leg pain with chronic pain syndrome, now with AKA and phantom limb pain             Cont Cymbalta 60mg              Cont Robaxin 500 BID PRN              Cont Gabapentin 300 BID             D/c Tramadol              Follow up with Wound Care/Surg/ID              2. Gait abnormality             Cont wheelchair  3. Sleep disturbance             Due to pain, see #1  4. Paraplegia             Being followed by PCP             Pt may follow if additional needs arise  5. Tobacco abuse             Educated on abstinence again (x6), now smoking 2 cig/day

## 2019-05-19 ENCOUNTER — Ambulatory Visit (INDEPENDENT_AMBULATORY_CARE_PROVIDER_SITE_OTHER): Payer: Medicare Other | Admitting: Dietician

## 2019-05-19 ENCOUNTER — Other Ambulatory Visit: Payer: Self-pay

## 2019-05-19 ENCOUNTER — Encounter: Payer: Self-pay | Admitting: Dietician

## 2019-05-19 ENCOUNTER — Ambulatory Visit (INDEPENDENT_AMBULATORY_CARE_PROVIDER_SITE_OTHER): Payer: Medicare Other | Admitting: Internal Medicine

## 2019-05-19 ENCOUNTER — Encounter: Payer: Self-pay | Admitting: Internal Medicine

## 2019-05-19 VITALS — BP 111/82 | HR 101 | Temp 98.7°F | Ht 73.0 in

## 2019-05-19 DIAGNOSIS — E785 Hyperlipidemia, unspecified: Secondary | ICD-10-CM

## 2019-05-19 DIAGNOSIS — F17210 Nicotine dependence, cigarettes, uncomplicated: Secondary | ICD-10-CM | POA: Diagnosis not present

## 2019-05-19 DIAGNOSIS — Z89611 Acquired absence of right leg above knee: Secondary | ICD-10-CM | POA: Diagnosis not present

## 2019-05-19 DIAGNOSIS — Z79899 Other long term (current) drug therapy: Secondary | ICD-10-CM

## 2019-05-19 DIAGNOSIS — Z713 Dietary counseling and surveillance: Secondary | ICD-10-CM

## 2019-05-19 DIAGNOSIS — Z794 Long term (current) use of insulin: Secondary | ICD-10-CM

## 2019-05-19 DIAGNOSIS — E1169 Type 2 diabetes mellitus with other specified complication: Secondary | ICD-10-CM

## 2019-05-19 DIAGNOSIS — E0800 Diabetes mellitus due to underlying condition with hyperosmolarity without nonketotic hyperglycemic-hyperosmolar coma (NKHHC): Secondary | ICD-10-CM

## 2019-05-19 DIAGNOSIS — M86659 Other chronic osteomyelitis, unspecified thigh: Secondary | ICD-10-CM

## 2019-05-19 MED ORDER — EZETIMIBE 10 MG PO TABS: 10.0000 mg | ORAL_TABLET | Freq: Every day | ORAL | 2 refills | Status: DC

## 2019-05-19 NOTE — Progress Notes (Signed)
   CC: Hospital follow up, Diabetes, HLD  HPI:  [name] is a 68 y.o. M with PMHx listed below presenting for Hospital follow up, Diabetes, HLD. Please see the A&P for the status of the patient's chronic medical problems.  Past Medical History:  Diagnosis Date  . Allergy   . Arthritis   . C. difficile colitis 11/2008  . Decubitus ulcer 1999   excision of right ischial pressure sore with flap reconstruction done by Dr. Denese Killings 10/31/2008  . GERD (gastroesophageal reflux disease)   . Hyperlipidemia   . Hypertension   . Paraplegia ([place])    secondary to [name]  . Perineal abscess   . Substance abuse ([place])    alcohol   Review of Systems:  Performed and all others negative.  Physical Exam:  Vitals:   05/19/19 1443  BP: 111/82  Pulse: (!) 101  Temp: 98.7 F (37.1 C)  TempSrc: Oral  SpO2: 97%  Height: 6\' 1"  (1.854 m)   Physical Exam Constitutional:      General: He is not in acute distress.    Appearance: Normal appearance.     Comments: Elderly male in wheel chair  Cardiovascular:     Rate and Rhythm: Normal rate and regular rhythm.     Pulses: Normal pulses.     Heart sounds: Normal heart sounds.     Comments: HR 96 Pulmonary:     Effort: Pulmonary effort is normal. No respiratory distress.     Breath sounds: Normal breath sounds.  Abdominal:     General: Bowel sounds are normal. There is no distension.     Palpations: Abdomen is soft.     Tenderness: There is no abdominal tenderness.  Musculoskeletal:        General: No swelling.     Comments: RLE amputation LLE thin (atrophy 2/2 paraplegia)  Skin:    General: Skin is warm and dry.  Neurological:     General: No focal deficit present.     Mental Status: Mental status is at baseline.     Assessment & Plan:   See Encounters Tab for problem based charting.  Patient discussed with Dr. Daryll Drown

## 2019-05-19 NOTE — Assessment & Plan Note (Signed)
Patient requesting refill of Ezetimibe and asking if he needs this. He does have a history of HLD and statin intolerance. Last Lipid panel was in 2017 so will repeat this today. Previous Lipid Panel     Component Value Date/Time   CHOL 122 10/23/2016 1404   TRIG 175 (H) 10/23/2016 1404   HDL 27 (L) 10/23/2016 1404   CHOLHDL 4.5 10/23/2016 1404   CHOLHDL 4.9 04/11/2014 1119   VLDL 33 04/11/2014 1119   LDLCALC 60 10/23/2016 1404  - Repeat Lipid Panel - Continue Ezetimibe 10mg  Daily

## 2019-05-19 NOTE — Progress Notes (Signed)
Diabetes Self-Management Education  Visit Type:  Follow-up  Appt. Start Time: 1515 Appt. End Time: 5643  05/19/2019  [name], identified by name and date of birth, is a 68 y.o. male with a diagnosis of Diabetes:  Marland Kitchen  Type 2  ASSESSMENT    Diabetes Self-Management Education - 05/19/19 1600      Health Coping   How would you rate your overall health?  Good      Complications   How often do you check your blood sugar?  1-2 times/day    Fasting Blood glucose range (mg/dL)  70-129;130-179    Number of hypoglycemic episodes per month  0    Number of hyperglycemic episodes per week  0      Dietary Intake   Breakfast  eggs, [place] sausage, toast, coffee    Lunch  pintos, baked chicken, fruit, vegetable    Dinner  salad, soup, cornbread, watermelon    Snack (evening)  grapes, straweberries, pinapple    Beverage(s)  water, grapefruit juice, diet drinks, wendy's diet lemoade      Exercise   Exercise Type  ADL's   in wheelchair limited by paraplegia     Patient Education   Previous Diabetes Education  Yes (please comment)    Nutrition management   Role of diet in the treatment of diabetes and the relationship between the three main macronutrients and blood glucose level;Meal options for control of blood glucose level and chronic complications.;Other (comment)   plate method   Acute complications  Taught treatment of hypoglycemia - the 15 rule.;Discussed and identified patients' treatment of hyperglycemia.      Individualized Goals (developed by patient)   Nutrition  Follow meal plan discussed      Patient Self-Evaluation of Goals - Patient rates self as meeting previously set goals (% of time)   Medications  >75%      Outcomes   Program Status  Not Completed      Subsequent Visit   Since your last visit have you continued or begun to take your medications as prescribed?  Yes    Since your last visit have you had your blood pressure checked?  Yes    Is your most recent  blood pressure lower, unchanged, or higher since your last visit?  Lower    Since your last visit have you experienced any weight changes?  No change    Since your last visit, are you checking your blood glucose at least once a day?  Yes       Learning Objective:  Patient will have a greater understanding of diabetes self-management. Patient education plan is to attend individual and/or group sessions per assessed needs and concerns.   Plan:   Patient Instructions  Good job on caring for your diabetes.  We talked about diabetes meal planning and high and lpw blood sugar today.     I recommend we follow up in 1 month- we should go over what is diabestes and how to care for diabetes   Fruits- 3 servings a day vegetables 2-4 servings per day protien- 3-4 servings per day Starches and whole grains- rice, pasta, sweet potatoes, white potatoes, corn, cereal, corn bread, whole grain bread- 6-8 servings a day  Call anytimeButch [name] ([phone]   Expected Outcomes:  Demonstrated interest in learning. Expect positive outcomes Education material provided: Diabetes Resources, avs If problems or questions, patient to contact team via:  Phone Future DSME appointment: - 4-6 wks  Debera Lat, RD 05/19/2019 4:45 PM.

## 2019-05-19 NOTE — Assessment & Plan Note (Signed)
Patient has this and other chronic wound, which were discussed during his recent admission. Management will continue per [facility] wound care. He has seen them since his admission and continues to follow up with them. - Follow up with [facility] wound care

## 2019-05-19 NOTE — Assessment & Plan Note (Addendum)
Patient was newly diagnosed on 05/05/2019 and admitted with diabetic ketosis. He is feeling well today, denies further polyuria or polydipsia. He left his glucometer with his wife in their vehicle, but has been checking his blood sugar. He states it has usually been in the 100s. The highest he has seen is around 300 and he has not seen any readings below 80. His blood sugar has decent control on this regimen. He will likely be able to transition to orals soon with hope to start weaning off insulin. Will continue current regimen for now and have patient follow up with his PCP. - Lantus 26U qhs - Novolog 10U  TIDac - BMP - PCP visit in 1-2 months - Return precautions given

## 2019-05-19 NOTE — Patient Instructions (Signed)
Good job on caring for your diabetes.  We talked about diabetes meal planning and high and lpw blood sugar today.     I recommend we follow up in 1 month- we should go over what is diabestes and how to care for diabetes   Fruits- 3 servings a day vegetables 2-4 servings per day protien- 3-4 servings per day Starches and whole grains- rice, pasta, sweet potatoes, white potatoes, corn, cereal, corn bread, whole grain bread- 6-8 servings a day  Call anytimeButch Penny [phone]

## 2019-05-19 NOTE — Patient Instructions (Addendum)
Thank yo for allowing [place] to care for you  For your Diabetes - Blood sugars are controlled base on your report - Continue current medications - Follow up with PCP in 1-2 months - Will check labs today  For your high cholesterol - Will recheck labs today - Refill provided  Continue to follow with [facility] wound care  Labs today, you will be contacted with the results  Follow up with PCP in 1-2 months

## 2019-05-20 ENCOUNTER — Ambulatory Visit: Payer: PRIVATE HEALTH INSURANCE

## 2019-05-20 LAB — BMP8+ANION GAP
Anion Gap: 20 mmol/L — ABNORMAL HIGH (ref 10.0–18.0)
BUN/Creatinine Ratio: 15 (ref 10–24)
BUN: 9 mg/dL (ref 8–27)
CO2: 20 mmol/L (ref 20–29)
Calcium: 8.7 mg/dL (ref 8.6–10.2)
Chloride: 96 mmol/L (ref 96–106)
Creatinine, Ser: 0.62 mg/dL — ABNORMAL LOW (ref 0.76–1.27)
GFR calc Af Amer: 117 mL/min/{1.73_m2} (ref >59)
GFR calc non Af Amer: 101 mL/min/{1.73_m2} (ref >59)
Glucose: 96 mg/dL (ref 65–99)
Potassium: 4 mmol/L (ref 3.5–5.2)
Sodium: 136 mmol/L (ref 134–144)

## 2019-05-20 LAB — LIPID PANEL
Chol/HDL Ratio: 4.4 ratio (ref 0.0–5.0)
Cholesterol, Total: 128 mg/dL (ref 100–199)
HDL: 29 mg/dL — ABNORMAL LOW (ref >39)
LDL Calculated: 79 mg/dL (ref 0–99)
Triglycerides: 102 mg/dL (ref 0–149)
VLDL Cholesterol Cal: 20 mg/dL (ref 5–40)

## 2019-05-24 ENCOUNTER — Encounter: Payer: Self-pay | Admitting: Internal Medicine

## 2019-05-24 NOTE — Progress Notes (Signed)
Patient sent letter about recent lab results. Low HDL noted, vegetables and exercise recommended.

## 2019-05-24 NOTE — Progress Notes (Signed)
Internal Medicine Clinic Attending  Case discussed with Dr. Melvin soon after the resident saw the patient.  We reviewed the resident's history and exam and pertinent patient test results.  I agree with the assessment, diagnosis, and plan of care documented in the resident's note.   

## 2019-06-04 ENCOUNTER — Other Ambulatory Visit: Payer: Self-pay

## 2019-06-04 NOTE — Telephone Encounter (Signed)
Pt's wife requesting to speak with a nurse about meds. Please call back.  

## 2019-06-04 NOTE — Telephone Encounter (Signed)
Patient scheduled pharmacy appointment 6/29, interested CGM

## 2019-06-04 NOTE — Telephone Encounter (Signed)
cbg's are ranging 78 to 177, he is eating well and taking all his meds. Spouse wants to know if pt will need to continue the diab meds was over 400.00 and this time it will be 300.+ please advise. They would really like to find a less costly way to deal with diab. He runs out of meds mon. Sending to dr Tarri Abernethy, dr Maudie Mercury and donnap.

## 2019-06-07 ENCOUNTER — Other Ambulatory Visit: Payer: Self-pay

## 2019-06-07 ENCOUNTER — Ambulatory Visit: Payer: Medicare Other | Admitting: Pharmacist

## 2019-06-07 DIAGNOSIS — E11622 Type 2 diabetes mellitus with other skin ulcer: Secondary | ICD-10-CM

## 2019-06-07 NOTE — Progress Notes (Signed)
Documentation for Freestyle Libre Pro Continuous glucose monitoring Freestyle Libre Pro CGM sensor placed today. Patient was educated about wearing sensor, keeping food, activity and medication log and when to call office. Patient was educated about how to care for the sensor and not to have an MRI, CT or Diathermy while wearing the sensor. Follow up was arranged with the patient for 1 week.   Lot #: [id number] Serial #: [id number] Expiration Date: 10/09/2019  Flossie Dibble, PharmD 06/07/2019 3:54 PM.

## 2019-06-08 DIAGNOSIS — Z435 Encounter for attention to cystostomy: Secondary | ICD-10-CM | POA: Diagnosis not present

## 2019-06-08 DIAGNOSIS — Z5181 Encounter for therapeutic drug level monitoring: Secondary | ICD-10-CM | POA: Diagnosis not present

## 2019-06-08 DIAGNOSIS — L89322 Pressure ulcer of left buttock, stage 2: Secondary | ICD-10-CM | POA: Diagnosis not present

## 2019-06-08 DIAGNOSIS — M8638 Chronic multifocal osteomyelitis, other site: Secondary | ICD-10-CM | POA: Diagnosis not present

## 2019-06-08 DIAGNOSIS — Z4800 Encounter for change or removal of nonsurgical wound dressing: Secondary | ICD-10-CM | POA: Diagnosis not present

## 2019-06-08 DIAGNOSIS — Z89611 Acquired absence of right leg above knee: Secondary | ICD-10-CM | POA: Diagnosis not present

## 2019-06-08 DIAGNOSIS — Z794 Long term (current) use of insulin: Secondary | ICD-10-CM | POA: Diagnosis not present

## 2019-06-08 DIAGNOSIS — L89154 Pressure ulcer of sacral region, stage 4: Secondary | ICD-10-CM | POA: Diagnosis not present

## 2019-06-08 DIAGNOSIS — M86151 Other acute osteomyelitis, right femur: Secondary | ICD-10-CM | POA: Diagnosis not present

## 2019-06-08 DIAGNOSIS — E1169 Type 2 diabetes mellitus with other specified complication: Secondary | ICD-10-CM | POA: Diagnosis not present

## 2019-06-08 DIAGNOSIS — E1110 Type 2 diabetes mellitus with ketoacidosis without coma: Secondary | ICD-10-CM | POA: Diagnosis not present

## 2019-06-08 DIAGNOSIS — G8220 Paraplegia, unspecified: Secondary | ICD-10-CM | POA: Diagnosis not present

## 2019-06-08 DIAGNOSIS — N312 Flaccid neuropathic bladder, not elsewhere classified: Secondary | ICD-10-CM | POA: Diagnosis not present

## 2019-06-10 ENCOUNTER — Other Ambulatory Visit: Payer: Self-pay | Admitting: Internal Medicine

## 2019-06-10 ENCOUNTER — Telehealth: Payer: Self-pay

## 2019-06-10 DIAGNOSIS — E0800 Diabetes mellitus due to underlying condition with hyperosmolarity without nonketotic hyperglycemic-hyperosmolar coma (NKHHC): Secondary | ICD-10-CM

## 2019-06-10 MED ORDER — INSULIN LISPRO 100 UNIT/ML ~~LOC~~ SOLN: SUBCUTANEOUS | 3 refills | Status: DC

## 2019-06-10 NOTE — Telephone Encounter (Signed)
Received fax refill request from [facility] requesting:   "Humalog 100unit/ml, inject 10 units into skin 3 times a day.... Pt requests new RX ASAP, Novolog more expensive"  NOV 7/16 Will forward to PCP. Thanks [employer], RN,BSN

## 2019-06-10 NOTE — Progress Notes (Signed)
Switched to humalog due to cost of novolog.

## 2019-06-14 ENCOUNTER — Ambulatory Visit: Payer: Medicare Other | Admitting: Pharmacist

## 2019-06-14 ENCOUNTER — Other Ambulatory Visit: Payer: Self-pay

## 2019-06-14 DIAGNOSIS — E11622 Type 2 diabetes mellitus with other skin ulcer: Secondary | ICD-10-CM

## 2019-06-14 NOTE — Progress Notes (Signed)
Freestyle [employer] and Reviewed  Freestyle Libre sensor placed 06/07/2019  Week #1  6-day BG average 100, within target range 91% of the time  Above 180 mg/dL 0% of the time  Below 70 mg/dL 7% of the time, lowest around 12am  Below 54 mg/dL 2% of the time  Due to the number of hypoglycemic episodes, Lantus dose was reduced to 22 units. We reviewed the symptoms of hypoglycemia and what to do when he experiences low sugars. He says his wife and granddaughter are a great support system and make sure he takes his medicine and checks his sugars regularly.  Patient would like to minimize the number of injections and possibly transition to an oral option like metformin.  Told patient we would price check some possible options and follow up with him later. He also requested to have his next CGM download on 7/16 at 14:30. We told him to contact the clinic if he runs into any issues or needs to reschedule.  [place], PharmD Candidates

## 2019-06-16 DIAGNOSIS — L89313 Pressure ulcer of right buttock, stage 3: Secondary | ICD-10-CM | POA: Diagnosis not present

## 2019-06-16 DIAGNOSIS — L89153 Pressure ulcer of sacral region, stage 3: Secondary | ICD-10-CM | POA: Diagnosis not present

## 2019-06-16 DIAGNOSIS — L89892 Pressure ulcer of other site, stage 2: Secondary | ICD-10-CM | POA: Diagnosis not present

## 2019-06-16 DIAGNOSIS — L89323 Pressure ulcer of left buttock, stage 3: Secondary | ICD-10-CM | POA: Diagnosis not present

## 2019-06-16 DIAGNOSIS — Z89611 Acquired absence of right leg above knee: Secondary | ICD-10-CM | POA: Diagnosis not present

## 2019-06-16 DIAGNOSIS — G8220 Paraplegia, unspecified: Secondary | ICD-10-CM | POA: Diagnosis not present

## 2019-06-16 MED ORDER — METFORMIN HCL ER 500 MG PO TB24: 1000.0000 mg | ORAL_TABLET | Freq: Two times a day (BID) | ORAL | 3 refills | Status: DC

## 2019-06-16 MED ORDER — XULTOPHY 100-3.6 UNIT-MG/ML ~~LOC~~ SOPN: 22.0000 [IU] | PEN_INJECTOR | Freq: Every day | SUBCUTANEOUS | 0 refills | Status: DC

## 2019-06-16 MED ORDER — OZEMPIC (0.25 OR 0.5 MG/DOSE) 2 MG/1.5ML ~~LOC~~ SOPN: 0.2500 mg | PEN_INJECTOR | SUBCUTANEOUS | 0 refills | Status: DC

## 2019-06-16 NOTE — Progress Notes (Signed)
Patient was seen in clinic with [facility] and Annitta Needs, PharmD candidates. I agree with the assessment and plan of care documented.  Starting metformin for now, will consider/help with GLP-1 initiation at follow up once metformin is titrated (Trulicity and Victoza appear to be preferred on patient's insurance).

## 2019-06-18 NOTE — Telephone Encounter (Signed)
Per Epic, rx for Humalog was sent to the pharmacy on 7/2.

## 2019-06-24 ENCOUNTER — Ambulatory Visit (INDEPENDENT_AMBULATORY_CARE_PROVIDER_SITE_OTHER): Payer: Medicare Other | Admitting: Internal Medicine

## 2019-06-24 ENCOUNTER — Ambulatory Visit: Payer: Medicare Other | Admitting: Dietician

## 2019-06-24 ENCOUNTER — Ambulatory Visit: Payer: PRIVATE HEALTH INSURANCE | Admitting: Pharmacist

## 2019-06-24 ENCOUNTER — Encounter: Payer: Self-pay | Admitting: Internal Medicine

## 2019-06-24 ENCOUNTER — Other Ambulatory Visit: Payer: Self-pay

## 2019-06-24 VITALS — BP 140/92 | HR 92 | Temp 98.2°F | Ht 73.0 in

## 2019-06-24 DIAGNOSIS — E118 Type 2 diabetes mellitus with unspecified complications: Secondary | ICD-10-CM | POA: Diagnosis not present

## 2019-06-24 DIAGNOSIS — Z794 Long term (current) use of insulin: Secondary | ICD-10-CM

## 2019-06-24 DIAGNOSIS — E0800 Diabetes mellitus due to underlying condition with hyperosmolarity without nonketotic hyperglycemic-hyperosmolar coma (NKHHC): Secondary | ICD-10-CM

## 2019-06-24 DIAGNOSIS — G8220 Paraplegia, unspecified: Secondary | ICD-10-CM

## 2019-06-24 DIAGNOSIS — F17210 Nicotine dependence, cigarettes, uncomplicated: Secondary | ICD-10-CM

## 2019-06-24 MED ORDER — INSULIN LISPRO 100 UNIT/ML ~~LOC~~ SOLN: SUBCUTANEOUS | 3 refills | Status: DC

## 2019-06-24 MED ORDER — LANTUS SOLOSTAR 100 UNIT/ML ~~LOC~~ SOPN: 18.0000 [IU] | PEN_INJECTOR | Freq: Every day | SUBCUTANEOUS | 11 refills | Status: DC

## 2019-06-24 NOTE — Progress Notes (Signed)
Diabetes Self-Management Education  Visit Type:  Follow-up  Appt. Start Time: 1313 Appt. End Time: 9798  06/24/2019  Shawn [name], identified by name and date of birth, is a 68 y.o. male with a diagnosis of Diabetes:  Marland Kitchen  Tye 2 newly diagnosed this year. ASSESSMENT  Today, I met with Shawn [name] briefly as his appointment for diabetes self management education and support  was scheduled at the same time as his doctor appointment.   Lab Results  Component Value Date   HGBA1C >14.0 (A) 05/05/2019   HGBA1C  11/11/2007    5.7 (NOTE)   The ADA recommends the following therapeutic goals for glycemic   control related to Hgb A1C measurement:   Goal of Therapy:   < 7.0% Hgb A1C   Action Suggested:  > 8.0% Hgb A1C   Ref:  Diabetes Care, 22, Suppl. 1, 1999     Diabetes Self-Management Education - 06/24/19 1600      Health Coping   How would you rate your overall health?  Good      Dietary Intake   Breakfast  juice, muffin, sausage      Exercise   Exercise Type  ADL's      Patient Education   Previous Diabetes Education  Yes (please comment)   here last month   Nutrition management   Carbohydrate counting    Personal strategies to promote health  Helped patient develop diabetes management plan for (enter comment)   improving after breakfast blood sugar spike noted on CGM     Patient Self-Evaluation of Goals - Patient rates self as meeting previously set goals (% of time)   Nutrition  50 - 75 %   drinking juice for breakfast with 2 muffins, eggs and sausag     Outcomes   Program Status  Not Completed      Subsequent Visit   Since your last visit have you continued or begun to take your medications as prescribed?  Yes    Since your last visit have you had your blood pressure checked?  Yes    Is your most recent blood pressure lower, unchanged, or higher since your last visit?  Unchanged    Since your last visit have you experienced any weight changes?  No change    Since your last  visit, are you checking your blood glucose at least once a day?  Yes       Learning Objective:  Patient will have a greater understanding of diabetes self-management. Patient education plan is to attend individual and/or group sessions per assessed needs and concerns.   Plan:   There are no Patient Instructions on file for this visit.   Expected Outcomes:  Demonstrated interest in learning. Expect positive outcomes Education material provided: Carbohydrate counting sheet If problems or questions, patient to contact team via:  Phone Future DSME appointment: - 4-6 wks  Debera Lat, RD 06/24/2019 4:38 PM.

## 2019-06-24 NOTE — Assessment & Plan Note (Signed)
Uncontrolled type 2 DM. Last A1c >14. Currently on Metformin 1000 mg BID, Lantus 20 units QHS, and Humolog 10 units TID WC. He has been wearing a CGM for the past 2 weeks and the interpretation can be seen below.   [name] wore the CGM for 14 days. The average reading was 94, % time in target was 89, % time below target was 11, and % time above target was 0.   Ideally we would work towards getting this patient onto PO medications. We will slowly decrease his insulin to ensure no rebound hyperglycemia.   A/P: - Continue Metformin 1000 mg BID  - Decrease your Lantus to 18 units daily  - Decrease your Humolog 8 units with meals - Follow-up in 1 months for recheck of A1c and further titration of medication. Would avoid SGLT-2 inhibitors in the ketone prone diabetic.

## 2019-06-24 NOTE — Patient Instructions (Addendum)
Thank you for allowing me to provide your care. You are doing a great job with your diabetes! Keep up the good work. Today we are making the following changes.   1) Decrease your Lantus to 18 units daily  2) Decrease your Humolog 8 units with meals   Continue all other medications as prescribed. I would like you to come back in 1 month for some repeat blood work and further management of your diabetes medications.

## 2019-06-24 NOTE — Progress Notes (Signed)
   CC: F/U DM  HPI:  [name] is a 68 y.o. M with PMHx listed below presenting for follo. Please see the A&P for the status of the patient's chronic medical problems.  Past Medical History:  Diagnosis Date  . Allergy   . Arthritis   . C. difficile colitis 11/2008  . Decubitus ulcer 1999   excision of right ischial pressure sore with flap reconstruction done by Dr. Denese [name] 10/31/2008  . GERD (gastroesophageal reflux disease)   . Hyperlipidemia   . Hypertension   . Paraplegia ([place])    secondary to [place]  . Perineal abscess   . Substance abuse ([place])    alcohol   Review of Systems: Performed and all others negative.  Physical Exam: Vitals:   06/24/19 1318  BP: (!) 140/92  Pulse: 92  Temp: 98.2 F (36.8 C)  TempSrc: Oral  SpO2: 100%  Height: 6\' 1"  (1.854 m)   General: Well nourished male in no acute distress Pulm: Good air movement with no wheezing or crackles  CV: RRR, no murmurs, no rubs   Assessment & Plan:   See Encounters Tab for problem based charting.  Patient discussed with Dr. Evette [name]

## 2019-06-24 NOTE — Patient Instructions (Addendum)
Shawn [name],   Your blood sugars are very well controlled. Keep checking your blood sugars and bringing your meter in as  Your diabetes medicines are adjusted.  Today we talked about your blood sugars are spiking after breakfast on many days- it will help if you can reduce the portion/amount of carbohydrates (starches and sugars)  you have at breakfast-  For example-  we talked about drinking water, milk, coffee or tea instead of juice( has a lot of sugar)  or eating 1/2 a muffin instead of a whole muffin.  I made you an appointment with me in August on the same day you see Dr. Tarri Abernethy- we still need to discuss a few more topics to complete your Diabetes Education.   Call me anytime with questions or concerns about your diabetes or nutrtion.  Butch Penny [phone]

## 2019-06-25 NOTE — Progress Notes (Signed)
Internal Medicine Clinic Attending  Case discussed with Dr. Helberg at the time of the visit.  We reviewed the resident's history and exam and pertinent patient test results.  I agree with the assessment, diagnosis, and plan of care documented in the resident's note.    

## 2019-06-29 ENCOUNTER — Other Ambulatory Visit: Payer: Self-pay | Admitting: Pharmacist

## 2019-06-29 DIAGNOSIS — E0800 Diabetes mellitus due to underlying condition with hyperosmolarity without nonketotic hyperglycemic-hyperosmolar coma (NKHHC): Secondary | ICD-10-CM

## 2019-06-29 MED ORDER — INSULIN GLARGINE 100 UNIT/ML ~~LOC~~ SOLN: 18.0000 [IU] | Freq: Every day | SUBCUTANEOUS | 3 refills | Status: DC

## 2019-06-29 MED ORDER — INSULIN LISPRO 100 UNIT/ML ~~LOC~~ SOLN: SUBCUTANEOUS | 3 refills | Status: DC

## 2019-07-06 DIAGNOSIS — N312 Flaccid neuropathic bladder, not elsewhere classified: Secondary | ICD-10-CM | POA: Diagnosis not present

## 2019-07-08 DIAGNOSIS — L989 Disorder of the skin and subcutaneous tissue, unspecified: Secondary | ICD-10-CM | POA: Diagnosis not present

## 2019-07-08 DIAGNOSIS — E1110 Type 2 diabetes mellitus with ketoacidosis without coma: Secondary | ICD-10-CM | POA: Diagnosis not present

## 2019-07-08 DIAGNOSIS — L89154 Pressure ulcer of sacral region, stage 4: Secondary | ICD-10-CM | POA: Diagnosis not present

## 2019-07-08 DIAGNOSIS — L89322 Pressure ulcer of left buttock, stage 2: Secondary | ICD-10-CM | POA: Diagnosis not present

## 2019-07-08 DIAGNOSIS — Z5181 Encounter for therapeutic drug level monitoring: Secondary | ICD-10-CM | POA: Diagnosis not present

## 2019-07-08 DIAGNOSIS — Z89611 Acquired absence of right leg above knee: Secondary | ICD-10-CM | POA: Diagnosis not present

## 2019-07-08 DIAGNOSIS — Z794 Long term (current) use of insulin: Secondary | ICD-10-CM | POA: Diagnosis not present

## 2019-07-08 DIAGNOSIS — Z4800 Encounter for change or removal of nonsurgical wound dressing: Secondary | ICD-10-CM | POA: Diagnosis not present

## 2019-07-08 DIAGNOSIS — G8220 Paraplegia, unspecified: Secondary | ICD-10-CM | POA: Diagnosis not present

## 2019-07-08 DIAGNOSIS — Z435 Encounter for attention to cystostomy: Secondary | ICD-10-CM | POA: Diagnosis not present

## 2019-07-08 DIAGNOSIS — E1169 Type 2 diabetes mellitus with other specified complication: Secondary | ICD-10-CM | POA: Diagnosis not present

## 2019-07-08 DIAGNOSIS — M86151 Other acute osteomyelitis, right femur: Secondary | ICD-10-CM | POA: Diagnosis not present

## 2019-07-08 DIAGNOSIS — M8638 Chronic multifocal osteomyelitis, other site: Secondary | ICD-10-CM | POA: Diagnosis not present

## 2019-07-21 DIAGNOSIS — Z89611 Acquired absence of right leg above knee: Secondary | ICD-10-CM | POA: Diagnosis not present

## 2019-07-21 DIAGNOSIS — L89213 Pressure ulcer of right hip, stage 3: Secondary | ICD-10-CM | POA: Diagnosis not present

## 2019-07-21 DIAGNOSIS — L89313 Pressure ulcer of right buttock, stage 3: Secondary | ICD-10-CM | POA: Diagnosis not present

## 2019-07-21 DIAGNOSIS — L89892 Pressure ulcer of other site, stage 2: Secondary | ICD-10-CM | POA: Diagnosis not present

## 2019-07-21 DIAGNOSIS — G8220 Paraplegia, unspecified: Secondary | ICD-10-CM | POA: Diagnosis not present

## 2019-07-21 DIAGNOSIS — L89323 Pressure ulcer of left buttock, stage 3: Secondary | ICD-10-CM | POA: Diagnosis not present

## 2019-07-22 ENCOUNTER — Encounter: Payer: Self-pay | Admitting: Internal Medicine

## 2019-07-22 ENCOUNTER — Ambulatory Visit (INDEPENDENT_AMBULATORY_CARE_PROVIDER_SITE_OTHER): Payer: Medicare Other | Admitting: Dietician

## 2019-07-22 ENCOUNTER — Other Ambulatory Visit: Payer: Self-pay

## 2019-07-22 ENCOUNTER — Telehealth: Payer: Self-pay | Admitting: Internal Medicine

## 2019-07-22 ENCOUNTER — Ambulatory Visit (INDEPENDENT_AMBULATORY_CARE_PROVIDER_SITE_OTHER): Payer: Medicare Other | Admitting: Internal Medicine

## 2019-07-22 ENCOUNTER — Encounter: Payer: Self-pay | Admitting: Dietician

## 2019-07-22 VITALS — BP 146/95 | HR 92 | Temp 98.8°F

## 2019-07-22 DIAGNOSIS — F17210 Nicotine dependence, cigarettes, uncomplicated: Secondary | ICD-10-CM | POA: Diagnosis not present

## 2019-07-22 DIAGNOSIS — Z6822 Body mass index (BMI) 22.0-22.9, adult: Secondary | ICD-10-CM | POA: Diagnosis not present

## 2019-07-22 DIAGNOSIS — E0800 Diabetes mellitus due to underlying condition with hyperosmolarity without nonketotic hyperglycemic-hyperosmolar coma (NKHHC): Secondary | ICD-10-CM

## 2019-07-22 DIAGNOSIS — Z713 Dietary counseling and surveillance: Secondary | ICD-10-CM

## 2019-07-22 DIAGNOSIS — Z794 Long term (current) use of insulin: Secondary | ICD-10-CM

## 2019-07-22 DIAGNOSIS — E119 Type 2 diabetes mellitus without complications: Secondary | ICD-10-CM | POA: Diagnosis not present

## 2019-07-22 DIAGNOSIS — G8220 Paraplegia, unspecified: Secondary | ICD-10-CM | POA: Diagnosis not present

## 2019-07-22 LAB — POCT GLYCOSYLATED HEMOGLOBIN (HGB A1C): Hemoglobin A1C: 6.8 % — AB (ref 4.0–5.6)

## 2019-07-22 LAB — GLUCOSE, CAPILLARY: Glucose-Capillary: 101 mg/dL — ABNORMAL HIGH (ref 70–99)

## 2019-07-22 MED ORDER — INSULIN GLARGINE 100 UNIT/ML ~~LOC~~ SOLN: 12.0000 [IU] | Freq: Every day | SUBCUTANEOUS | 3 refills | Status: DC

## 2019-07-22 NOTE — Assessment & Plan Note (Signed)
HPI:  Patient presented to the clinic for continued evaluation and management of his uncontrolled diabetes. He was seen at the beginning of July after wearing a CGM. His medications were adjusted to Lantus 18 units daily, NovoLog eight units TID with meals, and metformin 1000 mg BID. Since our last visit he has become much more conscious of his diet and is trying to avoid high carb foods and adding sugar to his coffee. His exercise is limited by his paraplegia. He checks his sugars twice daily with the average being around 100. He states that he is taking the Lantus 18 units and typically only eight units of NovoLog with one male. He is not taking the other two doses of NovoLog. He denies any hypoglycemic events.  A/P: - Discussed the patient with Dr. Rebeca Alert and Butch Penny. Given his risk of infection we will not use an SGL T2 inhibitor. Given his chronic wounds and normal BMI, weight loss may not be the best thing. - Continue metformin 1000 mg BID - Discontinue NovoLog 8 units with meals - Decrease Lantus 12 units daily.

## 2019-07-22 NOTE — Progress Notes (Signed)
   CC: F/U DM  HPI:  [name] is a 68 y.o. male with PMHx listed below presenting for DM. Please see the A&P for the status of the patient's chronic medical problems.  Past Medical History:  Diagnosis Date  . Allergy   . Arthritis   . C. difficile colitis 11/2008  . Decubitus ulcer 1999   excision of right ischial pressure sore with flap reconstruction done by Dr. Denese Killings 10/31/2008  . GERD (gastroesophageal reflux disease)   . Hyperlipidemia   . Hypertension   . Paraplegia ([place])    secondary to [place]  . Perineal abscess   . Substance abuse ([place])    alcohol   Review of Systems:  Performed and all others negative.  Physical Exam: Vitals:   07/22/19 1353  BP: (!) 156/96  Pulse: 98  Temp: 98.8 F (37.1 C)  TempSrc: Oral  SpO2: 99%   General: Well nourished male in no acute distress Pulm: Good air movement with no wheezing or crackles  CV: RRR, no murmurs, no rubs   Assessment & Plan:   See Encounters Tab for problem based charting.  Patient discussed with Dr. Rebeca Alert

## 2019-07-22 NOTE — Patient Instructions (Addendum)
Dear Mr. Zanders,  I recommend you switch from ensure to arginaid 2-3 times a day to help  with wound healing.  I do not recommend any weight loss.   Keep eating three balanced meals a day and plenty of protein (milk, nuts, beans, seeds, fish, chicken, [place], greek yogurt, etc)   Quitting smoking would help your wound healing and nutritional status.   It is also important to continue your great diabetes control to increase your wound healing and nutritional status.   Please make an eye exam appointment and ask them to send our office the notes.  Please do not hesitate to call for questions or concerns.   Butch Penny [phone]

## 2019-07-22 NOTE — Progress Notes (Signed)
Diabetes Self-Management Education  Visit Type: Follow-up  Appt. Start Time: 1435 Appt. End Time: 4315  07/22/2019  Mr. Matilde Bash, identified by name and date of birth, is a 68 y.o. male with a diagnosis of Diabetes:  .   ASSESSMENT  Lab Results  Component Value Date   HGBA1C 6.8 (A) 07/22/2019   HGBA1C >14.0 (A) 05/05/2019   HGBA1C  11/11/2007    5.7 (NOTE)   The ADA recommends the following therapeutic goals for glycemic   control related to Hgb A1C measurement:   Goal of Therapy:   < 7.0% Hgb A1C   Action Suggested:  > 8.0% Hgb A1C   Ref:  Diabetes Care, 22, Suppl. 1, 1999    Estimated body mass index is 22.96 kg/m as calculated from the following:   Height as of 06/24/19: 6\' 1"  (1.854 m).   Weight as of 05/17/19: 174 lb (78.9 kg).  Mr. Dyar is normal tounderweight for a man his age with a BMI ~22-23. I discouraged weight loss of any amount. He has a chronic non- healing pressure ulcer and a second wound on his stump that require increased nutritional intake to heal.  Today, I recommended he switch from "low sugar" ensure to arginaid and continue eating 3 good meals a day. I also stressed the importance of smoking cessation on wound healing, diabetes and overall health.  Diabetes Self-Management Education - 07/22/19 1400      Visit Information   Visit Type  Follow-up      Health Coping   How would you rate your overall health?  Good      Complications   Last HgB A1C per patient/outside source  6.8 %   this is <3 months, likley lower, ave cbg x30 days 102mg /dl   How often do you check your blood sugar?  1-2 times/day    Fasting Blood glucose range (mg/dL)  70-129    Number of hypoglycemic episodes per month  0    Number of hyperglycemic episodes per week  0    Have you had a dilated eye exam in the past 12 months?  No    Have you had a dental exam in the past 12 months?  No    Are you checking your feet?  Yes    How many days per week are you checking your feet?  --   he  says a nurse comes out 3x/week     Dietary Intake   Breakfast  has low sugar ensure 2-3/day      Exercise   Exercise Type  ADL's   wheelchair bound     Patient Education   Previous Diabetes Education  Yes (please comment)   here this year   Medications  Reviewed patients medication for diabetes, action, purpose, timing of dose and side effects.   disc.side effects of wt loss & decr appetite not recommende   Monitoring  Interpreting lab values - A1C, lipid, urine microalbumina.;Daily foot exams;Yearly dilated eye exam    Chronic complications  Assessed and discussed foot care and prevention of foot problems;Retinopathy and reason for yearly dilated eye exams;Nephropathy, what it is, prevention of, the use of ACE, ARB's and early detection of through urine microalbumia.   disc. smoking cessation     Patient Self-Evaluation of Goals - Patient rates self as meeting previously set goals (% of time)   Nutrition  >75%    Medications  >75%      Outcomes   Expected Outcomes  Demonstrated interest in learning. Expect positive outcomes    Future DMSE  3-4 months    Program Status  Completed      Subsequent Visit   Since your last visit have you continued or begun to take your medications as prescribed?  Yes    Since your last visit have you had your blood pressure checked?  Yes    Is your most recent blood pressure lower, unchanged, or higher since your last visit?  Unchanged    Since your last visit have you experienced any weight changes?  Gain    Weight Gain (lbs)  2    Since your last visit, are you checking your blood glucose at least once a day?  Yes       Individualized Plan for Diabetes Self-Management Training:   Learning Objective:  Patient will have a greater understanding of diabetes self-management. Patient education plan is to attend individual and/or group sessions per assessed needs and concerns.   Plan:   There are no Patient Instructions on file for this  visit.  Expected Outcomes:  Demonstrated interest in learning. Expect positive outcomes  Education material provided: Diabetes Resources  If problems or questions, patient to contact team via:  Phone  Future DSME appointment: 3-4 months

## 2019-07-22 NOTE — Patient Instructions (Signed)
Thank you for allowing me to provide your care. Today we are going to be starting you on a new medication. We will combined it with your long acting insulin so that it is not another injection or pill to take. Please continue to monitor your morning blood sugar.   I will see you back in 3 months or sooner if anything arises.

## 2019-07-22 NOTE — Telephone Encounter (Signed)
Attempted to call patient to discuss decreasing his Lantus instead of starting the new medication we discussed at his visit today. He did not answer. Left voicemail to call back to the clinic.  If he calls back please relay that his Lantus will be decreased to 12 units daily and we will not start that new medication due to the risk of weight loss and his chronic wounds.

## 2019-07-26 NOTE — Progress Notes (Signed)
Internal Medicine Clinic Attending  Case discussed with Dr. Helberg at the time of the visit.  We reviewed the resident's history and exam and pertinent patient test results.  I agree with the assessment, diagnosis, and plan of care documented in the resident's note.  Alexander Raines, M.D., Ph.D.  

## 2019-08-01 ENCOUNTER — Other Ambulatory Visit: Payer: Self-pay | Admitting: Physical Medicine & Rehabilitation

## 2019-08-03 ENCOUNTER — Other Ambulatory Visit: Payer: Self-pay | Admitting: Physical Medicine & Rehabilitation

## 2019-08-03 ENCOUNTER — Other Ambulatory Visit: Payer: Self-pay | Admitting: Internal Medicine

## 2019-08-03 DIAGNOSIS — N312 Flaccid neuropathic bladder, not elsewhere classified: Secondary | ICD-10-CM | POA: Diagnosis not present

## 2019-08-07 DIAGNOSIS — Z5181 Encounter for therapeutic drug level monitoring: Secondary | ICD-10-CM | POA: Diagnosis not present

## 2019-08-07 DIAGNOSIS — M8638 Chronic multifocal osteomyelitis, other site: Secondary | ICD-10-CM | POA: Diagnosis not present

## 2019-08-07 DIAGNOSIS — Z89611 Acquired absence of right leg above knee: Secondary | ICD-10-CM | POA: Diagnosis not present

## 2019-08-07 DIAGNOSIS — L989 Disorder of the skin and subcutaneous tissue, unspecified: Secondary | ICD-10-CM | POA: Diagnosis not present

## 2019-08-07 DIAGNOSIS — Z4800 Encounter for change or removal of nonsurgical wound dressing: Secondary | ICD-10-CM | POA: Diagnosis not present

## 2019-08-07 DIAGNOSIS — L89322 Pressure ulcer of left buttock, stage 2: Secondary | ICD-10-CM | POA: Diagnosis not present

## 2019-08-07 DIAGNOSIS — G8220 Paraplegia, unspecified: Secondary | ICD-10-CM | POA: Diagnosis not present

## 2019-08-07 DIAGNOSIS — L89154 Pressure ulcer of sacral region, stage 4: Secondary | ICD-10-CM | POA: Diagnosis not present

## 2019-08-07 DIAGNOSIS — E1169 Type 2 diabetes mellitus with other specified complication: Secondary | ICD-10-CM | POA: Diagnosis not present

## 2019-08-07 DIAGNOSIS — Z794 Long term (current) use of insulin: Secondary | ICD-10-CM | POA: Diagnosis not present

## 2019-08-07 DIAGNOSIS — Z435 Encounter for attention to cystostomy: Secondary | ICD-10-CM | POA: Diagnosis not present

## 2019-08-07 DIAGNOSIS — M86151 Other acute osteomyelitis, right femur: Secondary | ICD-10-CM | POA: Diagnosis not present

## 2019-08-07 DIAGNOSIS — E1110 Type 2 diabetes mellitus with ketoacidosis without coma: Secondary | ICD-10-CM | POA: Diagnosis not present

## 2019-08-17 ENCOUNTER — Ambulatory Visit: Payer: PRIVATE HEALTH INSURANCE | Admitting: Physical Medicine & Rehabilitation

## 2019-08-26 ENCOUNTER — Other Ambulatory Visit: Payer: Self-pay | Admitting: Internal Medicine

## 2019-08-31 DIAGNOSIS — N312 Flaccid neuropathic bladder, not elsewhere classified: Secondary | ICD-10-CM | POA: Diagnosis not present

## 2019-09-01 DIAGNOSIS — L89313 Pressure ulcer of right buttock, stage 3: Secondary | ICD-10-CM | POA: Diagnosis not present

## 2019-09-01 DIAGNOSIS — L89314 Pressure ulcer of right buttock, stage 4: Secondary | ICD-10-CM | POA: Diagnosis not present

## 2019-09-01 DIAGNOSIS — G8220 Paraplegia, unspecified: Secondary | ICD-10-CM | POA: Diagnosis not present

## 2019-09-15 ENCOUNTER — Encounter: Payer: Self-pay | Admitting: Physical Medicine & Rehabilitation

## 2019-09-15 ENCOUNTER — Other Ambulatory Visit: Payer: Self-pay

## 2019-09-15 ENCOUNTER — Encounter: Payer: Medicare Other | Attending: Physical Medicine & Rehabilitation | Admitting: Physical Medicine & Rehabilitation

## 2019-09-15 VITALS — BP 134/80 | HR 100 | Temp 98.0°F | Ht 73.5 in | Wt 180.0 lb

## 2019-09-15 DIAGNOSIS — R269 Unspecified abnormalities of gait and mobility: Secondary | ICD-10-CM | POA: Insufficient documentation

## 2019-09-15 DIAGNOSIS — G479 Sleep disorder, unspecified: Secondary | ICD-10-CM

## 2019-09-15 DIAGNOSIS — G894 Chronic pain syndrome: Secondary | ICD-10-CM | POA: Insufficient documentation

## 2019-09-15 DIAGNOSIS — G8220 Paraplegia, unspecified: Secondary | ICD-10-CM | POA: Diagnosis not present

## 2019-09-15 DIAGNOSIS — Z89611 Acquired absence of right leg above knee: Secondary | ICD-10-CM | POA: Diagnosis not present

## 2019-09-15 DIAGNOSIS — Z720 Tobacco use: Secondary | ICD-10-CM

## 2019-09-15 MED ORDER — METHOCARBAMOL 500 MG PO TABS: ORAL_TABLET | ORAL | 5 refills | Status: DC

## 2019-09-15 MED ORDER — GABAPENTIN 300 MG PO CAPS: 300.0000 mg | ORAL_CAPSULE | Freq: Two times a day (BID) | ORAL | 1 refills | Status: DC

## 2019-09-15 MED ORDER — DULOXETINE HCL 60 MG PO CPEP: 60.0000 mg | ORAL_CAPSULE | Freq: Every day | ORAL | 5 refills | Status: DC

## 2019-09-15 NOTE — Progress Notes (Signed)
Subjective:    Patient ID: Shawn [name]., male    DOB: 13-Jun-1951, 68 y.o.   MRN: [id number]   HPI Male with pmh/psh of paraplegia, Etoh abuse, HTN, L-spine surgery, flap present for follow up of pain, now with right AKA.  Initially stated: Presented since ~1995.  Exacerbated since fracture in 2007. Stable since then.  Narcotics improve the pain.  Cold exacerbate the pain.  Achy.  Non-radiating.  He has sensation to his knees.  Intermittent.  Ice/heat do not help.  Denies falls. Pain makes things uncomfortable.  Last clinic visit 05/17/2019. Since last visit, pt states he continues to take Cymbalta, later states he is not and is unsure.  Limited historian. He is taking Robaxin ~5/week. He is taking Gabapentin.  He continues to follow up with [facility] and had a VAC now. Denies falls. He is smoking 2-3/day.   Pain Inventory Average Pain 6 Pain Right Now 0 My pain is stabbing  In the last 24 hours, has pain interfered with the following? General activity 0 Relation with others 0 Enjoyment of life 0 What TIME of day is your pain at its worst? night Sleep (in general) Fair  Pain is worse with: cold temp Pain improves with: medication Relief from Meds: ?  Mobility use a wheelchair needs help with transfers  Function disabled: date disabled 1993 I need assistance with the following:  dressing, meal prep and household duties  Neuro/Psych No problems in this area  Prior Studies Any changes since last visit?  no  Physicians involved in your care Any changes since last visit?  no   Family History  Problem Relation Age of Onset  . Stroke Mother   . Coronary artery disease Father   . Coronary artery disease Paternal Uncle   . Coronary artery disease Paternal Aunt    Social History   Socioeconomic History  . Marital status: Married    Spouse name: Not on file  . Number of children: 1  . Years of education: Not on file  . Highest education level: Not on file   Occupational History  . Occupation: DISABILITY    Employer: UNEMPLOYED  Social Needs  . Financial resource strain: Not on file  . Food insecurity    Worry: Not on file    Inability: Not on file  . Transportation needs    Medical: Not on file    Non-medical: Not on file  Tobacco Use  . Smoking status: Current Every Day Smoker    Packs/day: 0.50    Years: 40.00    Pack years: 20.00    Types: Cigarettes  . Smokeless tobacco: Never Used  . Tobacco comment: 1 pack every 2 -3 days  Substance and Sexual Activity  . Alcohol use: No    Alcohol/week: 0.0 standard drinks  . Drug use: No  . Sexual activity: Not on file  Lifestyle  . Physical activity    Days per week: Not on file    Minutes per session: Not on file  . Stress: Not on file  Relationships  . Social Herbalist on phone: Not on file    Gets together: Not on file    Attends religious service: Not on file    Active member of club or organization: Not on file    Attends meetings of clubs or organizations: Not on file    Relationship status: Not on file  Other Topics Concern  . Not on file  Social History Narrative   Married, disabled, medicaid, functions independently at home, paraplegic      Family hx of : hypertension, diabetes, kidney disease, and other cancer   Past Surgical History:  Procedure Laterality Date  . BONE BIOPSY  11/2007   negative for organisms  . COLONOSCOPY WITH PROPOFOL N/A 08/27/2017   Procedure: COLONOSCOPY WITH PROPOFOL;  Surgeon: Irene Shipper, MD;  Location: [facility];  Service: Endoscopy;  Laterality: N/A;  . sacral wound flap  2002   done by Dr. Stephanie Coup  . SPINE SURGERY    . THROAT SURGERY     Past Medical History:  Diagnosis Date  . Allergy   . Arthritis   . C. difficile colitis 11/2008  . Decubitus ulcer 1999   excision of right ischial pressure sore with flap reconstruction done by Dr. Denese Killings 10/31/2008  . Diabetic ketosis ([place]) 05/05/2019  . GERD (gastroesophageal  reflux disease)   . Hyperlipidemia   . Hypertension   . Paraplegia ([place])    secondary to [place]  . Perineal abscess   . Substance abuse ([facility])    alcohol   BP 134/80   Pulse 100   Temp 98 F (36.7 C)   Ht 6' 1.5" (1.867 m)   Wt 180 lb (81.6 kg)   SpO2 97%   BMI 23.43 kg/m   Opioid Risk Score:   Fall Risk Score:  `1  Depression screen PHQ 2/9  Depression screen [facility] 2/9 07/22/2019 06/24/2019 05/19/2019 05/05/2019 11/16/2018 08/27/2018 08/13/2018  Decreased Interest 0 0 0 3 2 1  0  Down, Depressed, Hopeless 0 0 0 2 0 0 0  PHQ - 2 Score 0 0 0 5 2 1  0  Altered sleeping - 2 0 3 3 1  -  Tired, decreased energy - 0 0 3 1 2  -  Change in appetite - 0 0 2 1 1  -  Feeling bad or failure about yourself  - 0 0 0 0 0 -  Trouble concentrating - 0 0 3 0 1 -  Moving slowly or fidgety/restless - 0 0 3 2 1  -  Suicidal thoughts - 0 0 0 0 0 -  PHQ-9 Score - 2 0 19 9 7  -  Difficult doing work/chores Not difficult at all Not difficult at all Very difficult Somewhat difficult Not difficult at all Not difficult at all -  Some recent data might be hidden    Review of Systems  Constitutional: Negative.   HENT: Negative.   Eyes: Negative.   Respiratory: Negative.   Cardiovascular: Negative.   Gastrointestinal: Negative.   Endocrine: Negative.   Genitourinary: Negative.   Musculoskeletal: Positive for arthralgias and gait problem.  Skin: Negative.   Allergic/Immunologic: Negative.   Hematological: Negative.   Psychiatric/Behavioral: Negative.       Objective:   Physical Exam Gen: NAD. Vital signs reviewed HENT: Normocephalic. Atraumatic.  Eyes: EOMI. No discharge.  Cardio: No JVD. Pulm: Effort normal. Abd: Non-ndistended MSK:   Gait Non-nambulatory.              Right AKA Neuro:              Sensation absent below left knee             Strength          RLE HF 3/5  LLE HF 1/5, distally 0/5 Skin: Warm and Dry. Intact. Right AKA with VAC.    Assessment  & Plan:  Male with pmh/psh of paraplegia secondary to lumbar SCI, Etoh abuse, HTN, L-spine surgery, flap present for follow up for pain, now with right AKA.  1. Chronic right leg pain with chronic pain syndrome, now with AKA and phantom limb pain  Cont Cymbalta 60mg              Cont Robaxin 500 BID PRN              Cont Gabapentin 300 BID             D/c Tramadol              Cont follow up with Wound Care/Surg/ID              2. Gait abnormality             Cont wheelchair  3. Sleep disturbance             Due to pain, see #1  4. Paraplegia             Being followed by PCP             Pt may follow if additional needs arise  5. Tobacco abuse             Educated on abstinence again (x7), now smoking ~2-3 cig/day

## 2019-09-28 DIAGNOSIS — N312 Flaccid neuropathic bladder, not elsewhere classified: Secondary | ICD-10-CM | POA: Diagnosis not present

## 2019-10-06 DIAGNOSIS — L89313 Pressure ulcer of right buttock, stage 3: Secondary | ICD-10-CM | POA: Diagnosis not present

## 2019-10-06 DIAGNOSIS — G8220 Paraplegia, unspecified: Secondary | ICD-10-CM | POA: Diagnosis not present

## 2019-10-08 ENCOUNTER — Other Ambulatory Visit: Payer: Self-pay | Admitting: Physical Medicine & Rehabilitation

## 2019-10-13 ENCOUNTER — Encounter: Payer: Self-pay | Admitting: *Deleted

## 2019-10-26 DIAGNOSIS — N312 Flaccid neuropathic bladder, not elsewhere classified: Secondary | ICD-10-CM | POA: Diagnosis not present

## 2019-10-28 ENCOUNTER — Encounter: Payer: PRIVATE HEALTH INSURANCE | Admitting: Internal Medicine

## 2019-10-28 ENCOUNTER — Ambulatory Visit: Payer: PRIVATE HEALTH INSURANCE | Admitting: Dietician

## 2019-11-03 DIAGNOSIS — L89314 Pressure ulcer of right buttock, stage 4: Secondary | ICD-10-CM | POA: Diagnosis not present

## 2019-11-03 DIAGNOSIS — L89313 Pressure ulcer of right buttock, stage 3: Secondary | ICD-10-CM | POA: Diagnosis not present

## 2019-11-03 DIAGNOSIS — G8220 Paraplegia, unspecified: Secondary | ICD-10-CM | POA: Diagnosis not present

## 2019-11-03 DIAGNOSIS — L89213 Pressure ulcer of right hip, stage 3: Secondary | ICD-10-CM | POA: Diagnosis not present

## 2019-11-05 DIAGNOSIS — Z4800 Encounter for change or removal of nonsurgical wound dressing: Secondary | ICD-10-CM | POA: Diagnosis not present

## 2019-11-05 DIAGNOSIS — L98419 Non-pressure chronic ulcer of buttock with unspecified severity: Secondary | ICD-10-CM | POA: Diagnosis not present

## 2019-11-05 DIAGNOSIS — Z89611 Acquired absence of right leg above knee: Secondary | ICD-10-CM | POA: Diagnosis not present

## 2019-11-05 DIAGNOSIS — Z9350 Unspecified cystostomy status: Secondary | ICD-10-CM | POA: Diagnosis not present

## 2019-11-05 DIAGNOSIS — E785 Hyperlipidemia, unspecified: Secondary | ICD-10-CM | POA: Diagnosis not present

## 2019-11-05 DIAGNOSIS — Z794 Long term (current) use of insulin: Secondary | ICD-10-CM | POA: Diagnosis not present

## 2019-11-05 DIAGNOSIS — Z792 Long term (current) use of antibiotics: Secondary | ICD-10-CM | POA: Diagnosis not present

## 2019-11-05 DIAGNOSIS — I10 Essential (primary) hypertension: Secondary | ICD-10-CM | POA: Diagnosis not present

## 2019-11-05 DIAGNOSIS — E1169 Type 2 diabetes mellitus with other specified complication: Secondary | ICD-10-CM | POA: Diagnosis not present

## 2019-11-05 DIAGNOSIS — M8638 Chronic multifocal osteomyelitis, other site: Secondary | ICD-10-CM | POA: Diagnosis not present

## 2019-11-05 DIAGNOSIS — Z9181 History of falling: Secondary | ICD-10-CM | POA: Diagnosis not present

## 2019-11-05 DIAGNOSIS — G8220 Paraplegia, unspecified: Secondary | ICD-10-CM | POA: Diagnosis not present

## 2019-11-05 DIAGNOSIS — L89314 Pressure ulcer of right buttock, stage 4: Secondary | ICD-10-CM | POA: Diagnosis not present

## 2019-11-08 DIAGNOSIS — G8220 Paraplegia, unspecified: Secondary | ICD-10-CM | POA: Diagnosis not present

## 2019-11-08 DIAGNOSIS — E46 Unspecified protein-calorie malnutrition: Secondary | ICD-10-CM | POA: Diagnosis not present

## 2019-11-08 DIAGNOSIS — R799 Abnormal finding of blood chemistry, unspecified: Secondary | ICD-10-CM | POA: Diagnosis not present

## 2019-11-08 DIAGNOSIS — L89314 Pressure ulcer of right buttock, stage 4: Secondary | ICD-10-CM | POA: Diagnosis not present

## 2019-11-08 DIAGNOSIS — Z89611 Acquired absence of right leg above knee: Secondary | ICD-10-CM | POA: Diagnosis not present

## 2019-11-09 ENCOUNTER — Telehealth: Payer: Self-pay | Admitting: Internal Medicine

## 2019-11-09 NOTE — Telephone Encounter (Signed)
Would a different time on Thursday help? I could make some time that morning. Thank you.

## 2019-11-09 NOTE — Telephone Encounter (Signed)
Pt's wife states she is unable to transport him to his appt.  Pt must travel by ambulance to his appts and his insurance will not pay for ambulance rides, therefore she is requesting to keep his sch appointment, but would like a Telehealth Appt instead until the 1st of the year and when he can change his insurance.

## 2019-11-09 NOTE — Telephone Encounter (Signed)
Pt's wife requesting a TELEHEALTH visit for 11/12/2019 if possible due to his recent appt with the [facility] @ [facility].  Please advise if appt can be changed.

## 2019-11-10 NOTE — Telephone Encounter (Signed)
Telehealth is fine. I will call at his scheduled time. Thank you.

## 2019-11-10 NOTE — Telephone Encounter (Signed)
I will notify the patient.  Thank You!

## 2019-11-11 ENCOUNTER — Telehealth: Payer: Self-pay | Admitting: Dietician

## 2019-11-11 ENCOUNTER — Encounter: Payer: PRIVATE HEALTH INSURANCE | Admitting: Internal Medicine

## 2019-11-11 ENCOUNTER — Ambulatory Visit (INDEPENDENT_AMBULATORY_CARE_PROVIDER_SITE_OTHER): Payer: Medicare Other | Admitting: Internal Medicine

## 2019-11-11 ENCOUNTER — Other Ambulatory Visit: Payer: Self-pay

## 2019-11-11 DIAGNOSIS — F17210 Nicotine dependence, cigarettes, uncomplicated: Secondary | ICD-10-CM | POA: Diagnosis not present

## 2019-11-11 DIAGNOSIS — I10 Essential (primary) hypertension: Secondary | ICD-10-CM | POA: Diagnosis not present

## 2019-11-11 DIAGNOSIS — E119 Type 2 diabetes mellitus without complications: Secondary | ICD-10-CM | POA: Diagnosis not present

## 2019-11-11 DIAGNOSIS — Z794 Long term (current) use of insulin: Secondary | ICD-10-CM

## 2019-11-11 DIAGNOSIS — Z79899 Other long term (current) drug therapy: Secondary | ICD-10-CM

## 2019-11-11 DIAGNOSIS — L89159 Pressure ulcer of sacral region, unspecified stage: Secondary | ICD-10-CM

## 2019-11-11 DIAGNOSIS — Z792 Long term (current) use of antibiotics: Secondary | ICD-10-CM | POA: Diagnosis not present

## 2019-11-11 DIAGNOSIS — E0800 Diabetes mellitus due to underlying condition with hyperosmolarity without nonketotic hyperglycemic-hyperosmolar coma (NKHHC): Secondary | ICD-10-CM

## 2019-11-11 DIAGNOSIS — L89310 Pressure ulcer of right buttock, unstageable: Secondary | ICD-10-CM

## 2019-11-11 DIAGNOSIS — Z0000 Encounter for general adult medical examination without abnormal findings: Secondary | ICD-10-CM

## 2019-11-11 NOTE — Assessment & Plan Note (Signed)
Patient was recently diagnosed with a pressure ulcer infection. He states that a couple days ago he noticed fevers and subsequently went wound care. They noted that he had exposed bone. A bone culture was obtained. He is currently on oral doxycycline 100 mg twice daily. He states that x-rays and blood work return normal. He will follow-up on Wednesday for further evaluation and management. He does currently have a wound back in place and [facility] home[facility] is helping to manage this.

## 2019-11-11 NOTE — Assessment & Plan Note (Signed)
Patient is interested in having his influenza shot. He will ask the [facility] home[facility] nurse to send over the paperwork and I will prescribed the vaccine once I received the paperwork.

## 2019-11-11 NOTE — Progress Notes (Signed)
   CC: Sacral wound, DM, HTN, Infulenza  This is a telephone encounter between [employer]. and [name] on 11/11/2019 for Sacral wound, DM, HTN, Infulenza. The visit was conducted with the patient located at home and [address] at [employer]. The patient's identity was confirmed using their DOB and current address. The patient has consented to being evaluated through a telephone encounter and understands the associated risks (an examination cannot be done and the patient may need to come in for an appointment) / benefits (allows the patient to remain at home, decreasing exposure to coronavirus). I personally spent 12 minutes on medical discussion.   HPI:  [name] is a 68 y.o. with PMH as below.   Please see A&P for assessment of the patient's acute and chronic medical conditions.   Past Medical History:  Diagnosis Date  . Allergy   . Arthritis   . C. difficile colitis 11/2008  . Decubitus ulcer 1999   excision of right ischial pressure sore with flap reconstruction done by Dr. Denese Killings 10/31/2008  . Diabetic ketosis ([place]) 05/05/2019  . GERD (gastroesophageal reflux disease)   . Hyperlipidemia   . Hypertension   . Paraplegia ([place])    secondary to [place]  . Perineal abscess   . Substance abuse ([place])    alcohol   Review of Systems:  Performed and all others negative.  Assessment & Plan:   See Encounters Tab for problem based charting.  Patient discussed with Dr. Daryll Drown

## 2019-11-11 NOTE — Assessment & Plan Note (Signed)
Patient with well-controlled hypertension. He is currently on amlodipine 10 mg and hydrochlorothiazide 25 mg once daily. He is taking these medications consistently. 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 His blood pressure is typically 120-130/70-80. He has had no episodes of hypertension or hypotension. He is fairly limited mobility but denies orthostatic changes or side effects attributable to the medication.  Last BMP was in 05/2019 with stable electrolytes and creatinine.   A/P: - Well controlled  - Continue amlodipine 10 mg and hydrochlorothiazide 25 mg once daily.

## 2019-11-11 NOTE — Assessment & Plan Note (Signed)
Patient last seen on 07/22/2019. At the time his DM was well controlled with hemoglobin A1c of 6.8. He was having some hypoglycemia to his NovoLog was discontinued and his Lantus was decreased 12 units once a day. He was continued on metformin 1000 mg BID.  Since his last visit he is been doing well. He states that his blood sugar is typically between 110 and 120 with the highest recorded value of 178 and the lowest being in the mid 90s. He has had no further hypoglycemia. His weight has remained stable. He notes a decreased in his appetite but attributes that to consuming three Ensures per day.  A/P: - DM sounds well controlled based on CBG readings.  - Continue Lantus 12 units QD and Metformin 1000 mg BID

## 2019-11-12 NOTE — Progress Notes (Signed)
Internal Medicine Clinic Attending  Case discussed with Dr. Tarri Abernethy at the time of the visit.  We reviewed the resident's history, telephone conversation and pertinent patient test results.  I agree with the assessment, diagnosis, and plan of care documented in the resident's note.

## 2019-11-12 NOTE — Telephone Encounter (Signed)
Called to update our records for Mr. Glendening's diabetes eye exam. He has not been to an eye doctor, but his wife agrees schedule when he can sit up.

## 2019-11-17 DIAGNOSIS — G8220 Paraplegia, unspecified: Secondary | ICD-10-CM | POA: Diagnosis not present

## 2019-11-17 DIAGNOSIS — R52 Pain, unspecified: Secondary | ICD-10-CM | POA: Diagnosis not present

## 2019-11-17 DIAGNOSIS — R000 Tachycardia, unspecified: Secondary | ICD-10-CM | POA: Diagnosis not present

## 2019-11-17 DIAGNOSIS — L89312 Pressure ulcer of right buttock, stage 2: Secondary | ICD-10-CM | POA: Diagnosis not present

## 2019-11-17 DIAGNOSIS — L89314 Pressure ulcer of right buttock, stage 4: Secondary | ICD-10-CM | POA: Diagnosis not present

## 2019-11-17 DIAGNOSIS — Z7401 Bed confinement status: Secondary | ICD-10-CM | POA: Diagnosis not present

## 2019-11-17 DIAGNOSIS — E46 Unspecified protein-calorie malnutrition: Secondary | ICD-10-CM | POA: Diagnosis not present

## 2019-11-17 DIAGNOSIS — M869 Osteomyelitis, unspecified: Secondary | ICD-10-CM | POA: Diagnosis not present

## 2019-11-20 ENCOUNTER — Other Ambulatory Visit: Payer: Self-pay | Admitting: Internal Medicine

## 2019-12-02 DIAGNOSIS — M869 Osteomyelitis, unspecified: Secondary | ICD-10-CM | POA: Diagnosis not present

## 2019-12-02 DIAGNOSIS — Z7401 Bed confinement status: Secondary | ICD-10-CM | POA: Diagnosis not present

## 2019-12-02 DIAGNOSIS — R69 Illness, unspecified: Secondary | ICD-10-CM | POA: Diagnosis not present

## 2019-12-02 DIAGNOSIS — M86152 Other acute osteomyelitis, left femur: Secondary | ICD-10-CM | POA: Diagnosis not present

## 2019-12-02 DIAGNOSIS — L89312 Pressure ulcer of right buttock, stage 2: Secondary | ICD-10-CM | POA: Diagnosis not present

## 2019-12-02 DIAGNOSIS — L89314 Pressure ulcer of right buttock, stage 4: Secondary | ICD-10-CM | POA: Diagnosis not present

## 2019-12-02 DIAGNOSIS — R5381 Other malaise: Secondary | ICD-10-CM | POA: Diagnosis not present

## 2019-12-05 DIAGNOSIS — L98419 Non-pressure chronic ulcer of buttock with unspecified severity: Secondary | ICD-10-CM | POA: Diagnosis not present

## 2019-12-05 DIAGNOSIS — I10 Essential (primary) hypertension: Secondary | ICD-10-CM | POA: Diagnosis not present

## 2019-12-05 DIAGNOSIS — Z89611 Acquired absence of right leg above knee: Secondary | ICD-10-CM | POA: Diagnosis not present

## 2019-12-05 DIAGNOSIS — Z794 Long term (current) use of insulin: Secondary | ICD-10-CM | POA: Diagnosis not present

## 2019-12-05 DIAGNOSIS — Z792 Long term (current) use of antibiotics: Secondary | ICD-10-CM | POA: Diagnosis not present

## 2019-12-05 DIAGNOSIS — L89314 Pressure ulcer of right buttock, stage 4: Secondary | ICD-10-CM | POA: Diagnosis not present

## 2019-12-05 DIAGNOSIS — Z9181 History of falling: Secondary | ICD-10-CM | POA: Diagnosis not present

## 2019-12-05 DIAGNOSIS — Z4800 Encounter for change or removal of nonsurgical wound dressing: Secondary | ICD-10-CM | POA: Diagnosis not present

## 2019-12-05 DIAGNOSIS — E1169 Type 2 diabetes mellitus with other specified complication: Secondary | ICD-10-CM | POA: Diagnosis not present

## 2019-12-05 DIAGNOSIS — M8638 Chronic multifocal osteomyelitis, other site: Secondary | ICD-10-CM | POA: Diagnosis not present

## 2019-12-05 DIAGNOSIS — G8220 Paraplegia, unspecified: Secondary | ICD-10-CM | POA: Diagnosis not present

## 2019-12-05 DIAGNOSIS — Z9350 Unspecified cystostomy status: Secondary | ICD-10-CM | POA: Diagnosis not present

## 2019-12-05 DIAGNOSIS — E785 Hyperlipidemia, unspecified: Secondary | ICD-10-CM | POA: Diagnosis not present

## 2019-12-08 DIAGNOSIS — R69 Illness, unspecified: Secondary | ICD-10-CM | POA: Diagnosis not present

## 2019-12-08 DIAGNOSIS — I10 Essential (primary) hypertension: Secondary | ICD-10-CM | POA: Diagnosis not present

## 2019-12-08 DIAGNOSIS — L89314 Pressure ulcer of right buttock, stage 4: Secondary | ICD-10-CM | POA: Diagnosis not present

## 2019-12-08 DIAGNOSIS — F17210 Nicotine dependence, cigarettes, uncomplicated: Secondary | ICD-10-CM | POA: Diagnosis not present

## 2019-12-08 DIAGNOSIS — K219 Gastro-esophageal reflux disease without esophagitis: Secondary | ICD-10-CM | POA: Diagnosis not present

## 2019-12-08 DIAGNOSIS — M869 Osteomyelitis, unspecified: Secondary | ICD-10-CM | POA: Diagnosis not present

## 2019-12-08 DIAGNOSIS — Z79899 Other long term (current) drug therapy: Secondary | ICD-10-CM | POA: Diagnosis not present

## 2019-12-08 DIAGNOSIS — G8220 Paraplegia, unspecified: Secondary | ICD-10-CM | POA: Diagnosis not present

## 2019-12-08 DIAGNOSIS — Z7401 Bed confinement status: Secondary | ICD-10-CM | POA: Diagnosis not present

## 2019-12-08 DIAGNOSIS — R5381 Other malaise: Secondary | ICD-10-CM | POA: Diagnosis not present

## 2019-12-09 DIAGNOSIS — L89314 Pressure ulcer of right buttock, stage 4: Secondary | ICD-10-CM | POA: Diagnosis not present

## 2019-12-09 DIAGNOSIS — Z20828 Contact with and (suspected) exposure to other viral communicable diseases: Secondary | ICD-10-CM | POA: Diagnosis not present

## 2019-12-09 DIAGNOSIS — Z01812 Encounter for preprocedural laboratory examination: Secondary | ICD-10-CM | POA: Diagnosis not present

## 2019-12-16 DIAGNOSIS — M869 Osteomyelitis, unspecified: Secondary | ICD-10-CM | POA: Diagnosis not present

## 2019-12-16 DIAGNOSIS — M868X8 Other osteomyelitis, other site: Secondary | ICD-10-CM | POA: Diagnosis not present

## 2019-12-16 DIAGNOSIS — L89314 Pressure ulcer of right buttock, stage 4: Secondary | ICD-10-CM | POA: Diagnosis not present

## 2019-12-16 DIAGNOSIS — G8220 Paraplegia, unspecified: Secondary | ICD-10-CM | POA: Diagnosis not present

## 2019-12-16 DIAGNOSIS — Z79899 Other long term (current) drug therapy: Secondary | ICD-10-CM | POA: Diagnosis not present

## 2019-12-16 DIAGNOSIS — Z23 Encounter for immunization: Secondary | ICD-10-CM | POA: Diagnosis not present

## 2019-12-16 DIAGNOSIS — F17210 Nicotine dependence, cigarettes, uncomplicated: Secondary | ICD-10-CM | POA: Diagnosis not present

## 2019-12-16 DIAGNOSIS — K219 Gastro-esophageal reflux disease without esophagitis: Secondary | ICD-10-CM | POA: Diagnosis not present

## 2019-12-16 DIAGNOSIS — I10 Essential (primary) hypertension: Secondary | ICD-10-CM | POA: Diagnosis not present

## 2019-12-28 ENCOUNTER — Telehealth: Payer: Self-pay | Admitting: *Deleted

## 2019-12-28 NOTE — Telephone Encounter (Signed)
Per the pharmacy the EZETIMBE is $90.00 for 3 months, that is his copay, can you prescribe something cheaper, he has been getting it but spouse states it is a financial strain

## 2019-12-29 DIAGNOSIS — R5381 Other malaise: Secondary | ICD-10-CM | POA: Diagnosis not present

## 2019-12-29 DIAGNOSIS — I10 Essential (primary) hypertension: Secondary | ICD-10-CM | POA: Diagnosis not present

## 2019-12-29 DIAGNOSIS — L89312 Pressure ulcer of right buttock, stage 2: Secondary | ICD-10-CM | POA: Diagnosis not present

## 2019-12-29 DIAGNOSIS — L89314 Pressure ulcer of right buttock, stage 4: Secondary | ICD-10-CM | POA: Diagnosis not present

## 2019-12-29 DIAGNOSIS — Z7401 Bed confinement status: Secondary | ICD-10-CM | POA: Diagnosis not present

## 2019-12-29 DIAGNOSIS — R450 Nervousness: Secondary | ICD-10-CM | POA: Diagnosis not present

## 2019-12-29 DIAGNOSIS — R69 Illness, unspecified: Secondary | ICD-10-CM | POA: Diagnosis not present

## 2019-12-29 DIAGNOSIS — R52 Pain, unspecified: Secondary | ICD-10-CM | POA: Diagnosis not present

## 2019-12-29 NOTE — Telephone Encounter (Signed)
Patient has a allergy listed to statins. I am unable to find which statins he has tried in the past. We can discuss switching medications at his next visit. Thank you.

## 2020-01-04 DIAGNOSIS — L89314 Pressure ulcer of right buttock, stage 4: Secondary | ICD-10-CM | POA: Diagnosis not present

## 2020-01-04 DIAGNOSIS — Z794 Long term (current) use of insulin: Secondary | ICD-10-CM | POA: Diagnosis not present

## 2020-01-04 DIAGNOSIS — E1169 Type 2 diabetes mellitus with other specified complication: Secondary | ICD-10-CM | POA: Diagnosis not present

## 2020-01-04 DIAGNOSIS — Z4800 Encounter for change or removal of nonsurgical wound dressing: Secondary | ICD-10-CM | POA: Diagnosis not present

## 2020-01-04 DIAGNOSIS — Z466 Encounter for fitting and adjustment of urinary device: Secondary | ICD-10-CM | POA: Diagnosis not present

## 2020-01-04 DIAGNOSIS — Z435 Encounter for attention to cystostomy: Secondary | ICD-10-CM | POA: Diagnosis not present

## 2020-01-04 DIAGNOSIS — E785 Hyperlipidemia, unspecified: Secondary | ICD-10-CM | POA: Diagnosis not present

## 2020-01-04 DIAGNOSIS — I10 Essential (primary) hypertension: Secondary | ICD-10-CM | POA: Diagnosis not present

## 2020-01-04 DIAGNOSIS — M8638 Chronic multifocal osteomyelitis, other site: Secondary | ICD-10-CM | POA: Diagnosis not present

## 2020-01-04 DIAGNOSIS — Z9181 History of falling: Secondary | ICD-10-CM | POA: Diagnosis not present

## 2020-01-04 DIAGNOSIS — L989 Disorder of the skin and subcutaneous tissue, unspecified: Secondary | ICD-10-CM | POA: Diagnosis not present

## 2020-01-04 DIAGNOSIS — Z9350 Unspecified cystostomy status: Secondary | ICD-10-CM | POA: Diagnosis not present

## 2020-01-04 DIAGNOSIS — G8220 Paraplegia, unspecified: Secondary | ICD-10-CM | POA: Diagnosis not present

## 2020-01-04 DIAGNOSIS — Z89611 Acquired absence of right leg above knee: Secondary | ICD-10-CM | POA: Diagnosis not present

## 2020-01-04 DIAGNOSIS — Z792 Long term (current) use of antibiotics: Secondary | ICD-10-CM | POA: Diagnosis not present

## 2020-01-04 DIAGNOSIS — L98419 Non-pressure chronic ulcer of buttock with unspecified severity: Secondary | ICD-10-CM | POA: Diagnosis not present

## 2020-01-13 ENCOUNTER — Other Ambulatory Visit: Payer: PRIVATE HEALTH INSURANCE

## 2020-01-23 DIAGNOSIS — Z23 Encounter for immunization: Secondary | ICD-10-CM | POA: Diagnosis not present

## 2020-02-03 DIAGNOSIS — I10 Essential (primary) hypertension: Secondary | ICD-10-CM | POA: Diagnosis not present

## 2020-02-03 DIAGNOSIS — E1169 Type 2 diabetes mellitus with other specified complication: Secondary | ICD-10-CM | POA: Diagnosis not present

## 2020-02-03 DIAGNOSIS — L98419 Non-pressure chronic ulcer of buttock with unspecified severity: Secondary | ICD-10-CM | POA: Diagnosis not present

## 2020-02-03 DIAGNOSIS — Z466 Encounter for fitting and adjustment of urinary device: Secondary | ICD-10-CM | POA: Diagnosis not present

## 2020-02-03 DIAGNOSIS — Z794 Long term (current) use of insulin: Secondary | ICD-10-CM | POA: Diagnosis not present

## 2020-02-03 DIAGNOSIS — Z9181 History of falling: Secondary | ICD-10-CM | POA: Diagnosis not present

## 2020-02-03 DIAGNOSIS — E785 Hyperlipidemia, unspecified: Secondary | ICD-10-CM | POA: Diagnosis not present

## 2020-02-03 DIAGNOSIS — G8220 Paraplegia, unspecified: Secondary | ICD-10-CM | POA: Diagnosis not present

## 2020-02-03 DIAGNOSIS — Z4800 Encounter for change or removal of nonsurgical wound dressing: Secondary | ICD-10-CM | POA: Diagnosis not present

## 2020-02-03 DIAGNOSIS — Z89611 Acquired absence of right leg above knee: Secondary | ICD-10-CM | POA: Diagnosis not present

## 2020-02-03 DIAGNOSIS — Z435 Encounter for attention to cystostomy: Secondary | ICD-10-CM | POA: Diagnosis not present

## 2020-02-03 DIAGNOSIS — L89314 Pressure ulcer of right buttock, stage 4: Secondary | ICD-10-CM | POA: Diagnosis not present

## 2020-02-03 DIAGNOSIS — L989 Disorder of the skin and subcutaneous tissue, unspecified: Secondary | ICD-10-CM | POA: Diagnosis not present

## 2020-02-03 DIAGNOSIS — M8638 Chronic multifocal osteomyelitis, other site: Secondary | ICD-10-CM | POA: Diagnosis not present

## 2020-02-09 DIAGNOSIS — Z7401 Bed confinement status: Secondary | ICD-10-CM | POA: Diagnosis not present

## 2020-02-09 DIAGNOSIS — L89313 Pressure ulcer of right buttock, stage 3: Secondary | ICD-10-CM | POA: Diagnosis not present

## 2020-02-09 DIAGNOSIS — L89312 Pressure ulcer of right buttock, stage 2: Secondary | ICD-10-CM | POA: Diagnosis not present

## 2020-02-09 DIAGNOSIS — R5381 Other malaise: Secondary | ICD-10-CM | POA: Diagnosis not present

## 2020-02-09 DIAGNOSIS — L89314 Pressure ulcer of right buttock, stage 4: Secondary | ICD-10-CM | POA: Diagnosis not present

## 2020-02-09 DIAGNOSIS — R69 Illness, unspecified: Secondary | ICD-10-CM | POA: Diagnosis not present

## 2020-02-12 ENCOUNTER — Other Ambulatory Visit: Payer: Self-pay | Admitting: Internal Medicine

## 2020-02-12 DIAGNOSIS — E0800 Diabetes mellitus due to underlying condition with hyperosmolarity without nonketotic hyperglycemic-hyperosmolar coma (NKHHC): Secondary | ICD-10-CM

## 2020-02-14 ENCOUNTER — Other Ambulatory Visit: Payer: Self-pay | Admitting: Internal Medicine

## 2020-02-20 DIAGNOSIS — Z23 Encounter for immunization: Secondary | ICD-10-CM | POA: Diagnosis not present

## 2020-03-04 DIAGNOSIS — G8220 Paraplegia, unspecified: Secondary | ICD-10-CM | POA: Diagnosis not present

## 2020-03-04 DIAGNOSIS — Z466 Encounter for fitting and adjustment of urinary device: Secondary | ICD-10-CM | POA: Diagnosis not present

## 2020-03-04 DIAGNOSIS — Z794 Long term (current) use of insulin: Secondary | ICD-10-CM | POA: Diagnosis not present

## 2020-03-04 DIAGNOSIS — Z4800 Encounter for change or removal of nonsurgical wound dressing: Secondary | ICD-10-CM | POA: Diagnosis not present

## 2020-03-04 DIAGNOSIS — L89314 Pressure ulcer of right buttock, stage 4: Secondary | ICD-10-CM | POA: Diagnosis not present

## 2020-03-04 DIAGNOSIS — Z9350 Unspecified cystostomy status: Secondary | ICD-10-CM | POA: Diagnosis not present

## 2020-03-04 DIAGNOSIS — E1169 Type 2 diabetes mellitus with other specified complication: Secondary | ICD-10-CM | POA: Diagnosis not present

## 2020-03-04 DIAGNOSIS — E785 Hyperlipidemia, unspecified: Secondary | ICD-10-CM | POA: Diagnosis not present

## 2020-03-04 DIAGNOSIS — Z9181 History of falling: Secondary | ICD-10-CM | POA: Diagnosis not present

## 2020-03-04 DIAGNOSIS — Z89611 Acquired absence of right leg above knee: Secondary | ICD-10-CM | POA: Diagnosis not present

## 2020-03-04 DIAGNOSIS — Z435 Encounter for attention to cystostomy: Secondary | ICD-10-CM | POA: Diagnosis not present

## 2020-03-04 DIAGNOSIS — L98419 Non-pressure chronic ulcer of buttock with unspecified severity: Secondary | ICD-10-CM | POA: Diagnosis not present

## 2020-03-04 DIAGNOSIS — M8638 Chronic multifocal osteomyelitis, other site: Secondary | ICD-10-CM | POA: Diagnosis not present

## 2020-03-04 DIAGNOSIS — I10 Essential (primary) hypertension: Secondary | ICD-10-CM | POA: Diagnosis not present

## 2020-03-04 DIAGNOSIS — L989 Disorder of the skin and subcutaneous tissue, unspecified: Secondary | ICD-10-CM | POA: Diagnosis not present

## 2020-03-09 ENCOUNTER — Encounter: Payer: Medicare Other | Attending: Physical Medicine & Rehabilitation | Admitting: Physical Medicine & Rehabilitation

## 2020-03-09 ENCOUNTER — Encounter: Payer: Self-pay | Admitting: Physical Medicine & Rehabilitation

## 2020-03-09 ENCOUNTER — Other Ambulatory Visit: Payer: Self-pay

## 2020-03-09 VITALS — BP 138/70 | Temp 98.6°F | Ht 73.5 in | Wt 185.0 lb

## 2020-03-09 DIAGNOSIS — Z720 Tobacco use: Secondary | ICD-10-CM | POA: Diagnosis not present

## 2020-03-09 DIAGNOSIS — G894 Chronic pain syndrome: Secondary | ICD-10-CM

## 2020-03-09 DIAGNOSIS — G8220 Paraplegia, unspecified: Secondary | ICD-10-CM

## 2020-03-09 DIAGNOSIS — R269 Unspecified abnormalities of gait and mobility: Secondary | ICD-10-CM | POA: Insufficient documentation

## 2020-03-09 DIAGNOSIS — Z89611 Acquired absence of right leg above knee: Secondary | ICD-10-CM | POA: Diagnosis not present

## 2020-03-09 MED ORDER — GABAPENTIN 300 MG PO CAPS: 600.0000 mg | ORAL_CAPSULE | Freq: Two times a day (BID) | ORAL | 1 refills | Status: DC

## 2020-03-09 NOTE — Progress Notes (Signed)
Subjective:    Patient ID: Shawn [name]., male    DOB: Nov 23, 1951, 69 y.o.   MRN: [id number]  TELEHEALTH NOTE  Due to national recommendations of social distancing due to COVID 19, an audio/video telehealth visit is felt to be most appropriate for this patient at this time.  See Chart message from today for the patient's consent to telehealth from [place].     I verified that I am speaking with the correct person using two identifiers.  Location of patient: Home Location of provider: Office Method of communication: Telephone Names of participants : Zorita Pang scheduling, Geryl Rankins obtaining consent and vitals if available Established patient Time spent on call: 12 minutes   HPI Male with pmh/psh of paraplegia, Etoh abuse, HTN, L-spine surgery, flap present for follow up of pain, now with right AKA.  Initially stated: Presented since ~1995.  Exacerbated since fracture in 2007. Stable since then.  Narcotics improve the pain.  Cold exacerbate the pain.  Achy.  Non-radiating.  He has sensation to his knees.  Intermittent.  Ice/heat do not help.  Denies falls. Pain makes things uncomfortable.  Last clinic visit 09/15/2019.  Since that time, patient states his wound was healing, but opened back up.  He continues to follow up with wound care.  He was diagnosed with DM.  Denies falls. He continues to take Cymbalta and Robaxin and Gabapentin.  He would like to go up on his Gabapentin. Sleep is fair. He is smoking ~5/day.   Pain Inventory Average Pain 6 Pain Right Now 10 My pain is stabbing  In the last 24 hours, has pain interfered with the following? General activity 10 Relation with others 10 Enjoyment of life 5 What TIME of day is your pain at its worst? varies with activity Sleep (in general) Fair  Pain is worse with: walking, standing and cold temp Pain improves with: medication Relief from Meds: 2  Mobility use a  wheelchair needs help with transfers  Function disabled: date disabled 1993 I need assistance with the following:  dressing, meal prep and household duties  Neuro/Psych weakness numbness tingling trouble walking  Prior Studies Any changes since last visit?  no  Physicians involved in your care Any changes since last visit?  no   Family History  Problem Relation Age of Onset  . Stroke Mother   . Coronary artery disease Father   . Coronary artery disease Paternal Uncle   . Coronary artery disease Paternal Aunt    Social History   Socioeconomic History  . Marital status: Married    Spouse name: Not on file  . Number of children: 1  . Years of education: Not on file  . Highest education level: Not on file  Occupational History  . Occupation: DISABILITY    Employer: UNEMPLOYED  Tobacco Use  . Smoking status: Current Every Day Smoker    Packs/day: 0.50    Years: 40.00    Pack years: 20.00    Types: Cigarettes  . Smokeless tobacco: Never Used  . Tobacco comment: 1 pack every 2 -3 days  Substance and Sexual Activity  . Alcohol use: No    Alcohol/week: 0.0 standard drinks  . Drug use: No  . Sexual activity: Not on file  Other Topics Concern  . Not on file  Social History Narrative   Married, disabled, medicaid, functions independently at home, paraplegic      Family hx of : hypertension, diabetes, kidney  disease, and other cancer   Social Determinants of Health   Financial Resource Strain:   . Difficulty of Paying Living Expenses:   Food Insecurity:   . Worried About Charity fundraiser in the Last Year:   . Arboriculturist in the Last Year:   Transportation Needs:   . Film/video editor (Medical):   Marland Kitchen Lack of Transportation (Non-Medical):   Physical Activity:   . Days of Exercise per Week:   . Minutes of Exercise per Session:   Stress:   . Feeling of Stress :   Social Connections:   . Frequency of Communication with Friends and Family:   .  Frequency of Social Gatherings with Friends and Family:   . Attends Religious Services:   . Active Member of Clubs or Organizations:   . Attends Archivist Meetings:   Marland Kitchen Marital Status:    Past Surgical History:  Procedure Laterality Date  . BONE BIOPSY  11/2007   negative for organisms  . COLONOSCOPY WITH PROPOFOL N/A 08/27/2017   Procedure: COLONOSCOPY WITH PROPOFOL;  Surgeon: Irene Shipper, MD;  Location: [facility];  Service: Endoscopy;  Laterality: N/A;  . sacral wound flap  2002   done by Dr. Stephanie Coup  . SPINE SURGERY    . THROAT SURGERY     Past Medical History:  Diagnosis Date  . Allergy   . Arthritis   . C. difficile colitis 11/2008  . Decubitus ulcer 1999   excision of right ischial pressure sore with flap reconstruction done by Dr. Denese Killings 10/31/2008  . Diabetic ketosis ([place]) 05/05/2019  . GERD (gastroesophageal reflux disease)   . Hyperlipidemia   . Hypertension   . Paraplegia ([place])    secondary to [place]  . Perineal abscess   . Substance abuse ([facility])    alcohol   BP 138/70   Temp 98.6 F (37 C) (Oral)   Ht 6' 1.5" (1.867 m)   Wt 185 lb (83.9 kg)   BMI 24.08 kg/m   Opioid Risk Score:   Fall Risk Score:  `1  Depression screen PHQ 2/9  Depression screen [facility] 2/9 07/22/2019 06/24/2019 05/19/2019 05/05/2019 11/16/2018 08/27/2018 08/13/2018  Decreased Interest 0 0 0 3 2 1  0  Down, Depressed, Hopeless 0 0 0 2 0 0 0  PHQ - 2 Score 0 0 0 5 2 1  0  Altered sleeping - 2 0 3 3 1  -  Tired, decreased energy - 0 0 3 1 2  -  Change in appetite - 0 0 2 1 1  -  Feeling bad or failure about yourself  - 0 0 0 0 0 -  Trouble concentrating - 0 0 3 0 1 -  Moving slowly or fidgety/restless - 0 0 3 2 1  -  Suicidal thoughts - 0 0 0 0 0 -  PHQ-9 Score - 2 0 19 9 7  -  Difficult doing work/chores Not difficult at all Not difficult at all Very difficult Somewhat difficult Not difficult at all Not difficult at all -  Some recent data might be hidden    Review of Systems   Constitutional: Negative.   HENT: Negative.   Eyes: Negative.   Respiratory: Negative.   Cardiovascular: Negative.   Gastrointestinal: Negative.   Endocrine: Negative.   Genitourinary: Negative.   Musculoskeletal: Positive for arthralgias and gait problem.  Skin: Negative.   Allergic/Immunologic: Negative.   Neurological: Positive for numbness.       Tingling  Hematological: Negative.   Psychiatric/Behavioral: Negative.       Objective:   Physical Exam Gen: NAD.  Pulm: Effort normal. Neuro: Alert    Assessment & Plan:  Male with pmh/psh of paraplegia secondary to lumbar SCI, Etoh abuse, HTN, L-spine surgery, flap present for follow up for pain, now with right AKA.  1. Chronic right leg pain with chronic pain syndrome, now with AKA and phantom limb pain  Continue Cymbalta 60mg              Continue Robaxin 500 BID PRN              Will increase Gabapentin to 600 BID             D/c Tramadol              Continue follow up with Wound Care/Surg/ID              2. Gait abnormality             Continue wheelchair  3. Sleep disturbance             Due to pain, see #1  4. Paraplegia             Being followed by PCP             Pt may follow if additional needs arise  5. Tobacco abuse             Educated on abstinence again (x8), now smoking 5 cig/day

## 2020-03-13 DIAGNOSIS — L03317 Cellulitis of buttock: Secondary | ICD-10-CM | POA: Diagnosis not present

## 2020-03-13 DIAGNOSIS — L89316 Pressure-induced deep tissue damage of right buttock: Secondary | ICD-10-CM | POA: Diagnosis not present

## 2020-03-13 DIAGNOSIS — L89312 Pressure ulcer of right buttock, stage 2: Secondary | ICD-10-CM | POA: Diagnosis not present

## 2020-03-13 DIAGNOSIS — I10 Essential (primary) hypertension: Secondary | ICD-10-CM | POA: Diagnosis not present

## 2020-03-13 DIAGNOSIS — L89314 Pressure ulcer of right buttock, stage 4: Secondary | ICD-10-CM | POA: Diagnosis not present

## 2020-03-16 ENCOUNTER — Ambulatory Visit: Payer: PRIVATE HEALTH INSURANCE | Admitting: Physical Medicine & Rehabilitation

## 2020-03-22 DIAGNOSIS — L89312 Pressure ulcer of right buttock, stage 2: Secondary | ICD-10-CM | POA: Diagnosis not present

## 2020-03-22 DIAGNOSIS — R5381 Other malaise: Secondary | ICD-10-CM | POA: Diagnosis not present

## 2020-03-22 DIAGNOSIS — E46 Unspecified protein-calorie malnutrition: Secondary | ICD-10-CM | POA: Diagnosis not present

## 2020-03-22 DIAGNOSIS — Z743 Need for continuous supervision: Secondary | ICD-10-CM | POA: Diagnosis not present

## 2020-03-22 DIAGNOSIS — L89314 Pressure ulcer of right buttock, stage 4: Secondary | ICD-10-CM | POA: Diagnosis not present

## 2020-03-22 DIAGNOSIS — R279 Unspecified lack of coordination: Secondary | ICD-10-CM | POA: Diagnosis not present

## 2020-04-03 DIAGNOSIS — E1169 Type 2 diabetes mellitus with other specified complication: Secondary | ICD-10-CM | POA: Diagnosis not present

## 2020-04-03 DIAGNOSIS — Z89611 Acquired absence of right leg above knee: Secondary | ICD-10-CM | POA: Diagnosis not present

## 2020-04-03 DIAGNOSIS — G8220 Paraplegia, unspecified: Secondary | ICD-10-CM | POA: Diagnosis not present

## 2020-04-03 DIAGNOSIS — Z9350 Unspecified cystostomy status: Secondary | ICD-10-CM | POA: Diagnosis not present

## 2020-04-03 DIAGNOSIS — I10 Essential (primary) hypertension: Secondary | ICD-10-CM | POA: Diagnosis not present

## 2020-04-03 DIAGNOSIS — L98419 Non-pressure chronic ulcer of buttock with unspecified severity: Secondary | ICD-10-CM | POA: Diagnosis not present

## 2020-04-03 DIAGNOSIS — Z4800 Encounter for change or removal of nonsurgical wound dressing: Secondary | ICD-10-CM | POA: Diagnosis not present

## 2020-04-03 DIAGNOSIS — Z794 Long term (current) use of insulin: Secondary | ICD-10-CM | POA: Diagnosis not present

## 2020-04-03 DIAGNOSIS — E785 Hyperlipidemia, unspecified: Secondary | ICD-10-CM | POA: Diagnosis not present

## 2020-04-03 DIAGNOSIS — L89314 Pressure ulcer of right buttock, stage 4: Secondary | ICD-10-CM | POA: Diagnosis not present

## 2020-04-03 DIAGNOSIS — L989 Disorder of the skin and subcutaneous tissue, unspecified: Secondary | ICD-10-CM | POA: Diagnosis not present

## 2020-04-03 DIAGNOSIS — Z9181 History of falling: Secondary | ICD-10-CM | POA: Diagnosis not present

## 2020-04-03 DIAGNOSIS — M8638 Chronic multifocal osteomyelitis, other site: Secondary | ICD-10-CM | POA: Diagnosis not present

## 2020-04-12 DIAGNOSIS — E46 Unspecified protein-calorie malnutrition: Secondary | ICD-10-CM | POA: Diagnosis not present

## 2020-04-12 DIAGNOSIS — L89214 Pressure ulcer of right hip, stage 4: Secondary | ICD-10-CM | POA: Diagnosis not present

## 2020-04-12 DIAGNOSIS — L89152 Pressure ulcer of sacral region, stage 2: Secondary | ICD-10-CM | POA: Diagnosis not present

## 2020-04-12 DIAGNOSIS — L89314 Pressure ulcer of right buttock, stage 4: Secondary | ICD-10-CM | POA: Diagnosis not present

## 2020-04-26 DIAGNOSIS — R69 Illness, unspecified: Secondary | ICD-10-CM | POA: Diagnosis not present

## 2020-04-26 DIAGNOSIS — L89314 Pressure ulcer of right buttock, stage 4: Secondary | ICD-10-CM | POA: Diagnosis not present

## 2020-04-26 DIAGNOSIS — L89312 Pressure ulcer of right buttock, stage 2: Secondary | ICD-10-CM | POA: Diagnosis not present

## 2020-04-26 DIAGNOSIS — L89152 Pressure ulcer of sacral region, stage 2: Secondary | ICD-10-CM | POA: Diagnosis not present

## 2020-04-26 DIAGNOSIS — L89154 Pressure ulcer of sacral region, stage 4: Secondary | ICD-10-CM | POA: Diagnosis not present

## 2020-05-03 DIAGNOSIS — M8638 Chronic multifocal osteomyelitis, other site: Secondary | ICD-10-CM | POA: Diagnosis not present

## 2020-05-03 DIAGNOSIS — Z9350 Unspecified cystostomy status: Secondary | ICD-10-CM | POA: Diagnosis not present

## 2020-05-03 DIAGNOSIS — Z9181 History of falling: Secondary | ICD-10-CM | POA: Diagnosis not present

## 2020-05-03 DIAGNOSIS — Z89611 Acquired absence of right leg above knee: Secondary | ICD-10-CM | POA: Diagnosis not present

## 2020-05-03 DIAGNOSIS — L989 Disorder of the skin and subcutaneous tissue, unspecified: Secondary | ICD-10-CM | POA: Diagnosis not present

## 2020-05-03 DIAGNOSIS — E785 Hyperlipidemia, unspecified: Secondary | ICD-10-CM | POA: Diagnosis not present

## 2020-05-03 DIAGNOSIS — L98419 Non-pressure chronic ulcer of buttock with unspecified severity: Secondary | ICD-10-CM | POA: Diagnosis not present

## 2020-05-03 DIAGNOSIS — G8220 Paraplegia, unspecified: Secondary | ICD-10-CM | POA: Diagnosis not present

## 2020-05-03 DIAGNOSIS — I10 Essential (primary) hypertension: Secondary | ICD-10-CM | POA: Diagnosis not present

## 2020-05-03 DIAGNOSIS — Z794 Long term (current) use of insulin: Secondary | ICD-10-CM | POA: Diagnosis not present

## 2020-05-03 DIAGNOSIS — E1169 Type 2 diabetes mellitus with other specified complication: Secondary | ICD-10-CM | POA: Diagnosis not present

## 2020-05-03 DIAGNOSIS — L89314 Pressure ulcer of right buttock, stage 4: Secondary | ICD-10-CM | POA: Diagnosis not present

## 2020-05-03 DIAGNOSIS — Z993 Dependence on wheelchair: Secondary | ICD-10-CM | POA: Diagnosis not present

## 2020-05-09 ENCOUNTER — Encounter: Payer: Self-pay | Admitting: Physical Medicine & Rehabilitation

## 2020-05-09 ENCOUNTER — Encounter: Payer: Medicare Other | Attending: Physical Medicine & Rehabilitation | Admitting: Physical Medicine & Rehabilitation

## 2020-05-09 ENCOUNTER — Other Ambulatory Visit: Payer: Self-pay

## 2020-05-09 VITALS — Temp 98.7°F | Ht 73.5 in | Wt 185.0 lb

## 2020-05-09 DIAGNOSIS — G894 Chronic pain syndrome: Secondary | ICD-10-CM

## 2020-05-09 DIAGNOSIS — Z720 Tobacco use: Secondary | ICD-10-CM

## 2020-05-09 DIAGNOSIS — Z89611 Acquired absence of right leg above knee: Secondary | ICD-10-CM | POA: Diagnosis not present

## 2020-05-09 DIAGNOSIS — R269 Unspecified abnormalities of gait and mobility: Secondary | ICD-10-CM

## 2020-05-09 DIAGNOSIS — G8220 Paraplegia, unspecified: Secondary | ICD-10-CM | POA: Insufficient documentation

## 2020-05-09 DIAGNOSIS — G546 Phantom limb syndrome with pain: Secondary | ICD-10-CM | POA: Insufficient documentation

## 2020-05-09 DIAGNOSIS — Z87828 Personal history of other (healed) physical injury and trauma: Secondary | ICD-10-CM

## 2020-05-09 NOTE — Progress Notes (Signed)
Subjective:    Patient ID: [name], male    DOB: [birth year], 69 y.o.   MRN: [id number]  TELEHEALTH NOTE  Due to national recommendations of social distancing due to COVID 19, an audio/video telehealth visit is felt to be most appropriate for this patient at this time.  See Chart message from today for the patient's consent to telehealth from [place].     I verified that I am speaking with the correct person using two identifiers.  Location of patient: Home Location of provider: Office Method of communication: Telephone Names of participants : Zorita Pang scheduling, Wenda Overland obtaining consent and vitals if available Established patient Time spent on call: 11 minutes   HPI Male with pmh/psh of paraplegia, Etoh abuse, HTN, L-spine surgery, flap present for follow up of pain, now with right AKA.  Initially stated: Presented since ~1995.  Exacerbated since fracture in 2007. Stable since then.  Narcotics improve the pain.  Cold exacerbate the pain.  Achy.  Non-radiating.  He has sensation to his knees.  Intermittent.  Ice/heat do not help.  Denies falls. Pain makes things uncomfortable.  Last clinic visit 03/09/2020.  Since that time, patient states he continues to take Cymbalta, Robaxin. He is only taking 300 BID of Gabapentin because he states the pharmacy told him otherwise. He continues to follow up with Wound care. Denies falls. He states there was some confusion with insurance and he does not have his VAC anymore. He states he cannot afford cigarettes and is smoking 1-2/day.    Pain Inventory Average Pain 7 Pain Right Now 6 My pain is nerve pain in right stump  In the last 24 hours, has pain interfered with the following? General activity 10 Relation with others 10 Enjoyment of life 5 What TIME of day is your pain at its worst? varies with activity Sleep (in general) Fair  Pain is worse with: unsure and phantom pain Pain  improves with: medication Relief from Meds: 2  Mobility use a wheelchair needs help with transfers  Has not been using  Wheelchair for a while---does not get out of bed   Function disabled: date disabled 1993 I need assistance with the following:  dressing, bathing, toileting, meal prep, household duties and shopping  Neuro/Psych weakness numbness tingling trouble walking  Prior Studies Any changes since last visit?  yes  Wound vac has not been on for 2 1/2 weeks, wife still dressing wound bid  Physicians involved in your care Any changes since last visit?  no   Family History  Problem Relation Age of Onset  . Stroke Mother   . Coronary artery disease Father   . Coronary artery disease Paternal Uncle   . Coronary artery disease Paternal Aunt    Social History   Socioeconomic History  . Marital status: Married    Spouse name: Not on file  . Number of children: 1  . Years of education: Not on file  . Highest education level: Not on file  Occupational History  . Occupation: DISABILITY    Employer: UNEMPLOYED  Tobacco Use  . Smoking status: Current Every Day Smoker    Packs/day: 0.50    Years: 40.00    Pack years: 20.00    Types: Cigarettes  . Smokeless tobacco: Never Used  . Tobacco comment: 1 pack every 2 -3 days  Substance and Sexual Activity  . Alcohol use: No    Alcohol/week: 0.0 standard drinks  .  Drug use: No  . Sexual activity: Not on file  Other Topics Concern  . Not on file  Social History Narrative   Married, disabled, medicaid, functions independently at home, paraplegic      Family hx of : hypertension, diabetes, kidney disease, and other cancer   Social Determinants of Health   Financial Resource Strain:   . Difficulty of Paying Living Expenses:   Food Insecurity:   . Worried About Charity fundraiser in the Last Year:   . Arboriculturist in the Last Year:   Transportation Needs:   . Film/video editor (Medical):   Marland Kitchen Lack of  Transportation (Non-Medical):   Physical Activity:   . Days of Exercise per Week:   . Minutes of Exercise per Session:   Stress:   . Feeling of Stress :   Social Connections:   . Frequency of Communication with Friends and Family:   . Frequency of Social Gatherings with Friends and Family:   . Attends Religious Services:   . Active Member of Clubs or Organizations:   . Attends Archivist Meetings:   Marland Kitchen Marital Status:    Past Surgical History:  Procedure Laterality Date  . BONE BIOPSY  11/2007   negative for organisms  . COLONOSCOPY WITH PROPOFOL N/A 08/27/2017   Procedure: COLONOSCOPY WITH PROPOFOL;  Surgeon: Irene Shipper, MD;  Location: [facility];  Service: Endoscopy;  Laterality: N/A;  . sacral wound flap  2002   done by Dr. Stephanie Coup  . SPINE SURGERY    . THROAT SURGERY     Past Medical History:  Diagnosis Date  . Allergy   . Arthritis   . C. difficile colitis 11/2008  . Decubitus ulcer 1999   excision of right ischial pressure sore with flap reconstruction done by Dr. Denese Killings 10/31/2008  . Diabetic ketosis ([place]) 05/05/2019  . GERD (gastroesophageal reflux disease)   . Hyperlipidemia   . Hypertension   . Paraplegia ([place])    secondary to [place]  . Perineal abscess   . Substance abuse ([place])    alcohol   Temp 98.7 F (37.1 C) Comment: 100.3 F (37.9 C) last pm  Ht 6' 1.5" (1.867 m) Comment: last recorded  Wt 185 lb (83.9 kg) Comment: last recorded  BMI 24.08 kg/m   Opioid Risk Score:   Fall Risk Score:  `1  Depression screen PHQ 2/9  Depression screen [facility] 2/9 05/09/2020 07/22/2019 06/24/2019 05/19/2019 05/05/2019 11/16/2018 08/27/2018  Decreased Interest 0 0 0 0 3 2 1   Down, Depressed, Hopeless 0 0 0 0 2 0 0  PHQ - 2 Score 0 0 0 0 5 2 1   Altered sleeping - - 2 0 3 3 1   Tired, decreased energy - - 0 0 3 1 2   Change in appetite - - 0 0 2 1 1   Feeling bad or failure about yourself  - - 0 0 0 0 0  Trouble concentrating - - 0 0 3 0 1  Moving slowly or  fidgety/restless - - 0 0 3 2 1   Suicidal thoughts - - 0 0 0 0 0  PHQ-9 Score - - 2 0 19 9 7   Difficult doing work/chores - Not difficult at all Not difficult at all Very difficult Somewhat difficult Not difficult at all Not difficult at all  Some recent data might be hidden    Review of Systems  Constitutional: Negative.   HENT: Negative.   Eyes: Negative.  Respiratory: Negative.   Cardiovascular: Negative.   Gastrointestinal: Negative.   Endocrine: Negative.   Genitourinary: Negative.   Musculoskeletal: Positive for arthralgias and gait problem.  Skin: Positive for wound.  Allergic/Immunologic: Negative.   Neurological: Positive for numbness.       Tingling   Hematological: Negative.   Psychiatric/Behavioral: Negative.       Objective:   Physical Exam Gen: NAD.  Pulm: Effort normal. Neuro: Alert    Assessment & Plan:  Male with pmh/psh of paraplegia secondary to lumbar SCI, Etoh abuse, HTN, L-spine surgery, flap present for follow up for pain, now with right AKA.  1. Chronic right leg pain with chronic pain syndrome, now with AKAand phantom limb pain             Continue Cymbalta 60mg  Continue Robaxin 500 BID PRN   Increased Gabapentin to 600 BID, reminded and educated pain D/c Tramadol   Continue follow up with Wound Care/Surg/ID, awaiting wound VAC due to insurance issues  2. Gait abnormality  Continue wheelchair  3. Sleep disturbance Due to pain, see #1  4. Paraplegia Being followed by PCP Pt may follow if additional needs arise  5. Tobacco abuse Educated on abstinence again, now smoking 1-2/day due to cost

## 2020-05-14 ENCOUNTER — Other Ambulatory Visit: Payer: Self-pay | Admitting: Internal Medicine

## 2020-05-15 ENCOUNTER — Other Ambulatory Visit: Payer: Self-pay | Admitting: Internal Medicine

## 2020-05-15 DIAGNOSIS — E785 Hyperlipidemia, unspecified: Secondary | ICD-10-CM

## 2020-05-17 DIAGNOSIS — R5381 Other malaise: Secondary | ICD-10-CM | POA: Diagnosis not present

## 2020-05-17 DIAGNOSIS — G8220 Paraplegia, unspecified: Secondary | ICD-10-CM | POA: Diagnosis not present

## 2020-05-17 DIAGNOSIS — Z720 Tobacco use: Secondary | ICD-10-CM | POA: Diagnosis not present

## 2020-05-17 DIAGNOSIS — L89314 Pressure ulcer of right buttock, stage 4: Secondary | ICD-10-CM | POA: Diagnosis not present

## 2020-05-17 DIAGNOSIS — Z89611 Acquired absence of right leg above knee: Secondary | ICD-10-CM | POA: Diagnosis not present

## 2020-05-17 DIAGNOSIS — L89153 Pressure ulcer of sacral region, stage 3: Secondary | ICD-10-CM | POA: Diagnosis not present

## 2020-05-17 DIAGNOSIS — Z7401 Bed confinement status: Secondary | ICD-10-CM | POA: Diagnosis not present

## 2020-05-17 DIAGNOSIS — I10 Essential (primary) hypertension: Secondary | ICD-10-CM | POA: Diagnosis not present

## 2020-05-17 DIAGNOSIS — L89313 Pressure ulcer of right buttock, stage 3: Secondary | ICD-10-CM | POA: Diagnosis not present

## 2020-05-17 DIAGNOSIS — M869 Osteomyelitis, unspecified: Secondary | ICD-10-CM | POA: Diagnosis not present

## 2020-05-17 DIAGNOSIS — R69 Illness, unspecified: Secondary | ICD-10-CM | POA: Diagnosis not present

## 2020-05-17 DIAGNOSIS — L03317 Cellulitis of buttock: Secondary | ICD-10-CM | POA: Diagnosis not present

## 2020-05-17 DIAGNOSIS — L89152 Pressure ulcer of sacral region, stage 2: Secondary | ICD-10-CM | POA: Diagnosis not present

## 2020-05-17 DIAGNOSIS — E46 Unspecified protein-calorie malnutrition: Secondary | ICD-10-CM | POA: Diagnosis not present

## 2020-05-19 ENCOUNTER — Other Ambulatory Visit: Payer: Self-pay | Admitting: Internal Medicine

## 2020-05-30 ENCOUNTER — Emergency Department (HOSPITAL_COMMUNITY): Payer: Medicare Other

## 2020-05-30 ENCOUNTER — Other Ambulatory Visit: Payer: Self-pay

## 2020-05-30 ENCOUNTER — Inpatient Hospital Stay (HOSPITAL_COMMUNITY)
Admission: EM | Admit: 2020-05-30 | Discharge: 2020-06-03 | DRG: 637 | Disposition: A | Discharge status: Home Health | Payer: Medicare Other | Attending: Family Medicine | Admitting: Family Medicine

## 2020-05-30 ENCOUNTER — Encounter (HOSPITAL_COMMUNITY): Payer: Self-pay | Admitting: Emergency Medicine

## 2020-05-30 DIAGNOSIS — L89319 Pressure ulcer of right buttock, unspecified stage: Secondary | ICD-10-CM | POA: Diagnosis present

## 2020-05-30 DIAGNOSIS — L304 Erythema intertrigo: Secondary | ICD-10-CM | POA: Diagnosis present

## 2020-05-30 DIAGNOSIS — K120 Recurrent oral aphthae: Secondary | ICD-10-CM | POA: Diagnosis present

## 2020-05-30 DIAGNOSIS — J90 Pleural effusion, not elsewhere classified: Secondary | ICD-10-CM | POA: Diagnosis not present

## 2020-05-30 DIAGNOSIS — Z8614 Personal history of Methicillin resistant Staphylococcus aureus infection: Secondary | ICD-10-CM

## 2020-05-30 DIAGNOSIS — Z794 Long term (current) use of insulin: Secondary | ICD-10-CM

## 2020-05-30 DIAGNOSIS — Z79899 Other long term (current) drug therapy: Secondary | ICD-10-CM

## 2020-05-30 DIAGNOSIS — H269 Unspecified cataract: Secondary | ICD-10-CM | POA: Diagnosis present

## 2020-05-30 DIAGNOSIS — I10 Essential (primary) hypertension: Secondary | ICD-10-CM | POA: Diagnosis present

## 2020-05-30 DIAGNOSIS — R54 Age-related physical debility: Secondary | ICD-10-CM | POA: Diagnosis present

## 2020-05-30 DIAGNOSIS — R197 Diarrhea, unspecified: Secondary | ICD-10-CM | POA: Diagnosis not present

## 2020-05-30 DIAGNOSIS — D62 Acute posthemorrhagic anemia: Secondary | ICD-10-CM | POA: Diagnosis present

## 2020-05-30 DIAGNOSIS — R066 Hiccough: Secondary | ICD-10-CM | POA: Diagnosis not present

## 2020-05-30 DIAGNOSIS — M86659 Other chronic osteomyelitis, unspecified thigh: Secondary | ICD-10-CM | POA: Diagnosis present

## 2020-05-30 DIAGNOSIS — J029 Acute pharyngitis, unspecified: Secondary | ICD-10-CM | POA: Diagnosis not present

## 2020-05-30 DIAGNOSIS — R0602 Shortness of breath: Secondary | ICD-10-CM | POA: Diagnosis not present

## 2020-05-30 DIAGNOSIS — M4628 Osteomyelitis of vertebra, sacral and sacrococcygeal region: Secondary | ICD-10-CM | POA: Diagnosis not present

## 2020-05-30 DIAGNOSIS — B370 Candidal stomatitis: Secondary | ICD-10-CM | POA: Diagnosis present

## 2020-05-30 DIAGNOSIS — E871 Hypo-osmolality and hyponatremia: Secondary | ICD-10-CM | POA: Diagnosis present

## 2020-05-30 DIAGNOSIS — R269 Unspecified abnormalities of gait and mobility: Secondary | ICD-10-CM

## 2020-05-30 DIAGNOSIS — E0800 Diabetes mellitus due to underlying condition with hyperosmolarity without nonketotic hyperglycemic-hyperosmolar coma (NKHHC): Secondary | ICD-10-CM

## 2020-05-30 DIAGNOSIS — G546 Phantom limb syndrome with pain: Secondary | ICD-10-CM | POA: Diagnosis present

## 2020-05-30 DIAGNOSIS — G894 Chronic pain syndrome: Secondary | ICD-10-CM | POA: Diagnosis present

## 2020-05-30 DIAGNOSIS — Z8249 Family history of ischemic heart disease and other diseases of the circulatory system: Secondary | ICD-10-CM

## 2020-05-30 DIAGNOSIS — E860 Dehydration: Secondary | ICD-10-CM | POA: Diagnosis not present

## 2020-05-30 DIAGNOSIS — E119 Type 2 diabetes mellitus without complications: Secondary | ICD-10-CM

## 2020-05-30 DIAGNOSIS — K219 Gastro-esophageal reflux disease without esophagitis: Secondary | ICD-10-CM | POA: Diagnosis present

## 2020-05-30 DIAGNOSIS — Z89611 Acquired absence of right leg above knee: Secondary | ICD-10-CM

## 2020-05-30 DIAGNOSIS — D638 Anemia in other chronic diseases classified elsewhere: Secondary | ICD-10-CM | POA: Diagnosis present

## 2020-05-30 DIAGNOSIS — G8220 Paraplegia, unspecified: Secondary | ICD-10-CM | POA: Diagnosis present

## 2020-05-30 DIAGNOSIS — L89159 Pressure ulcer of sacral region, unspecified stage: Secondary | ICD-10-CM | POA: Diagnosis not present

## 2020-05-30 DIAGNOSIS — M8668 Other chronic osteomyelitis, other site: Secondary | ICD-10-CM | POA: Diagnosis present

## 2020-05-30 DIAGNOSIS — Z20822 Contact with and (suspected) exposure to covid-19: Secondary | ICD-10-CM | POA: Diagnosis not present

## 2020-05-30 DIAGNOSIS — F17200 Nicotine dependence, unspecified, uncomplicated: Secondary | ICD-10-CM | POA: Diagnosis present

## 2020-05-30 DIAGNOSIS — Z720 Tobacco use: Secondary | ICD-10-CM | POA: Diagnosis present

## 2020-05-30 DIAGNOSIS — R000 Tachycardia, unspecified: Secondary | ICD-10-CM | POA: Diagnosis not present

## 2020-05-30 DIAGNOSIS — M1990 Unspecified osteoarthritis, unspecified site: Secondary | ICD-10-CM | POA: Diagnosis present

## 2020-05-30 DIAGNOSIS — E1169 Type 2 diabetes mellitus with other specified complication: Secondary | ICD-10-CM | POA: Diagnosis not present

## 2020-05-30 DIAGNOSIS — F1120 Opioid dependence, uncomplicated: Secondary | ICD-10-CM | POA: Diagnosis present

## 2020-05-30 DIAGNOSIS — E785 Hyperlipidemia, unspecified: Secondary | ICD-10-CM | POA: Diagnosis present

## 2020-05-30 DIAGNOSIS — B372 Candidiasis of skin and nail: Secondary | ICD-10-CM | POA: Diagnosis present

## 2020-05-30 DIAGNOSIS — Z6822 Body mass index (BMI) 22.0-22.9, adult: Secondary | ICD-10-CM

## 2020-05-30 DIAGNOSIS — F17210 Nicotine dependence, cigarettes, uncomplicated: Secondary | ICD-10-CM | POA: Diagnosis present

## 2020-05-30 DIAGNOSIS — E43 Unspecified severe protein-calorie malnutrition: Secondary | ICD-10-CM | POA: Insufficient documentation

## 2020-05-30 DIAGNOSIS — E1136 Type 2 diabetes mellitus with diabetic cataract: Secondary | ICD-10-CM | POA: Diagnosis present

## 2020-05-30 LAB — COMPREHENSIVE METABOLIC PANEL
ALT: 19 U/L (ref 0–44)
AST: 13 U/L — ABNORMAL LOW (ref 15–41)
Albumin: 1.8 g/dL — ABNORMAL LOW (ref 3.5–5.0)
Alkaline Phosphatase: 91 U/L (ref 38–126)
Anion gap: 12 (ref 5–15)
BUN: 13 mg/dL (ref 8–23)
CO2: 19 mmol/L — ABNORMAL LOW (ref 22–32)
Calcium: 7.8 mg/dL — ABNORMAL LOW (ref 8.9–10.3)
Chloride: 96 mmol/L — ABNORMAL LOW (ref 98–111)
Creatinine, Ser: 0.63 mg/dL (ref 0.61–1.24)
GFR calc Af Amer: >60 mL/min (ref >60)
GFR calc non Af Amer: >60 mL/min (ref >60)
Glucose, Bld: 94 mg/dL (ref 70–99)
Potassium: 4.2 mmol/L (ref 3.5–5.1)
Sodium: 127 mmol/L — ABNORMAL LOW (ref 135–145)
Total Bilirubin: 0.5 mg/dL (ref 0.3–1.2)
Total Protein: 6.4 g/dL — ABNORMAL LOW (ref 6.5–8.1)

## 2020-05-30 LAB — CBC WITH DIFFERENTIAL/PLATELET
Abs Immature Granulocytes: 0.18 10*3/uL — ABNORMAL HIGH (ref 0.00–0.07)
Basophils Absolute: 0 10*3/uL (ref 0.0–0.1)
Basophils Relative: 0 %
Eosinophils Absolute: 0.1 10*3/uL (ref 0.0–0.5)
Eosinophils Relative: 1 %
HCT: 26.1 % — ABNORMAL LOW (ref 39.0–52.0)
Hemoglobin: 7.8 g/dL — ABNORMAL LOW (ref 13.0–17.0)
Immature Granulocytes: 1 %
Lymphocytes Relative: 7 %
Lymphs Abs: 0.9 10*3/uL (ref 0.7–4.0)
MCH: 19.3 pg — ABNORMAL LOW (ref 26.0–34.0)
MCHC: 29.9 g/dL — ABNORMAL LOW (ref 30.0–36.0)
MCV: 64.6 fL — ABNORMAL LOW (ref 80.0–100.0)
Monocytes Absolute: 1.1 10*3/uL — ABNORMAL HIGH (ref 0.1–1.0)
Monocytes Relative: 8 %
Neutro Abs: 10.6 10*3/uL — ABNORMAL HIGH (ref 1.7–7.7)
Neutrophils Relative %: 83 %
Platelets: 861 10*3/uL — ABNORMAL HIGH (ref 150–400)
RBC: 4.04 MIL/uL — ABNORMAL LOW (ref 4.22–5.81)
RDW: 21.4 % — ABNORMAL HIGH (ref 11.5–15.5)
WBC: 12.9 10*3/uL — ABNORMAL HIGH (ref 4.0–10.5)
nRBC: 0 % (ref 0.0–0.2)

## 2020-05-30 LAB — RETICULOCYTES
Immature Retic Fract: 30.4 % — ABNORMAL HIGH (ref 2.3–15.9)
RBC.: 4.05 MIL/uL — ABNORMAL LOW (ref 4.22–5.81)
Retic Count, Absolute: 59.5 10*3/uL (ref 19.0–186.0)
Retic Ct Pct: 1.5 % (ref 0.4–3.1)

## 2020-05-30 LAB — POC OCCULT BLOOD, ED: Fecal Occult Bld: NEGATIVE

## 2020-05-30 LAB — C-REACTIVE PROTEIN: CRP: 19.2 mg/dL — ABNORMAL HIGH (ref <1.0)

## 2020-05-30 LAB — PROTIME-INR
INR: 1.2 (ref 0.8–1.2)
Prothrombin Time: 14.5 seconds (ref 11.4–15.2)

## 2020-05-30 LAB — LIPASE, BLOOD: Lipase: 14 U/L (ref 11–51)

## 2020-05-30 LAB — LACTIC ACID, PLASMA: Lactic Acid, Venous: 1.1 mmol/L (ref 0.5–1.9)

## 2020-05-30 MED ORDER — IOHEXOL 300 MG/ML  SOLN
100.0000 mL | Freq: Once | INTRAMUSCULAR | Status: AC | PRN
  Administered 2020-05-30: 100 mL via INTRAVENOUS

## 2020-05-30 MED ORDER — SODIUM CHLORIDE 0.9 % IV BOLUS
1000.0000 mL | Freq: Once | INTRAVENOUS | Status: AC
  Administered 2020-05-30: 1000 mL via INTRAVENOUS

## 2020-05-30 MED ORDER — NYSTATIN 100000 UNIT/ML MT SUSP
5.0000 mL | Freq: Once | OROMUCOSAL | Status: AC
  Administered 2020-05-31: 500000 [IU] via ORAL
  Filled 2020-05-30: qty 5

## 2020-05-30 MED ORDER — FLUCONAZOLE 150 MG PO TABS
150.0000 mg | ORAL_TABLET | Freq: Once | ORAL | Status: AC
  Administered 2020-05-31: 150 mg via ORAL
  Filled 2020-05-30: qty 1

## 2020-05-30 NOTE — ED Triage Notes (Signed)
Pt here from home with [facility]. PT and wife concerned for wound infection on his bottom, dehydration, hiccups, and possible thrush. Pt usually sees [facility] but wife was unable to get in touch with them today.

## 2020-05-30 NOTE — ED Provider Notes (Signed)
Emergency Department Provider Note   I have reviewed the triage vital signs and the nursing notes.   HISTORY  Chief Complaint Wound Infection   HPI Shawn [name]. is a 69 y.o. male with PMH reviewed below presents to the ED with inability to tolerate p.o., mouth pain/sore throat, and worsening decubitus wound despite abx.  The wound is managed primarily by [facility] both at their wound clinic and with infectious disease. Patient with chronic osteomyelitis in the area but recently started on antibiotic within the past 2 weeks.  The patient and wife, at bedside, states that he has had worsening erythema around the area and white patches on the tongue with pain since starting the antibiotics.  He has also developed profuse watery diarrhea without obvious blood.  Denies abdominal pain or vomiting.  He has not had the ability to eat or drink in the past several days and is feeling very dehydrated.  He is not experiencing chest pain or shortness of breath.  Wife reports an unintentional weight loss of approximately 20 pounds as well as intermittent hiccups.  The wife is unsure of the antibiotic patient is taking but will go out to the car and retrieve the bottle for review.    Past Medical History:  Diagnosis Date  . Allergy   . Arthritis   . C. difficile colitis 11/2008  . Decubitus ulcer 1999   excision of right ischial pressure sore with flap reconstruction done by Dr. Denese Killings 10/31/2008  . Diabetic ketosis ([place]) 05/05/2019  . GERD (gastroesophageal reflux disease)   . Hyperlipidemia   . Hypertension   . Paraplegia ([place])    secondary to [place]  . Perineal abscess   . Substance abuse ([place])    alcohol    Patient Active Problem List   Diagnosis Date Noted  . Sacral osteomyelitis ([place]) 05/31/2020  . PARAPLEGIA 05/09/2020  . Phantom limb pain ([place]) 05/09/2020  . S/P AKA (above knee amputation), right ([place]) 03/09/2020  . Neurologic gait disorder 03/09/2020  . Chronic pain  syndrome 05/17/2019  . Ulceration of stump of above knee amputation of right lower extremity ([place]) 05/06/2019  . Chronic osteomyelitis involving pelvic region and thigh ([place]) 05/06/2019  . Diabetes mellitus ([place]) 05/05/2019  . Colon cancer screening 08/25/2017  . Opioid type dependence, continuous ([place]) 12/17/2016  . Cataracts, bilateral 01/30/2015  . Tobacco use 07/15/2013  . Health care maintenance 09/29/2012  . Decubitus ulcer of right ischium 07/14/2012  . Hypertension 04/14/2012  . Hyperlipidemia 12/29/2006  . Paraplegic spinal paralysis ([place]) 12/29/2006    Past Surgical History:  Procedure Laterality Date  . BONE BIOPSY  11/2007   negative for organisms  . COLONOSCOPY WITH PROPOFOL N/A 08/27/2017   Procedure: COLONOSCOPY WITH PROPOFOL;  Surgeon: Irene Shipper, MD;  Location: [facility] END[facility]COPY;  Service: Endoscopy;  Laterality: N/A;  . sacral wound flap  2002   done by Dr. Stephanie Coup  . SPINE SURGERY    . THROAT SURGERY      Allergies Penicillins and Statins  Family History  Problem Relation Age of Onset  . Stroke Mother   . Coronary artery disease Father   . Coronary artery disease Paternal Uncle   . Coronary artery disease Paternal Aunt     Social History Social History   Tobacco Use  . Smoking status: Current Every Day Smoker    Packs/day: 0.50    Years: 40.00    Pack years: 20.00    Types: Cigarettes  .  Smokeless tobacco: Never Used  . Tobacco comment: 1 pack every 2 -3 days  Vaping Use  . Vaping Use: Never used  Substance Use Topics  . Alcohol use: No    Alcohol/week: 0.0 standard drinks  . Drug use: No    Review of Systems  Constitutional: No fever/chills Eyes: No visual changes. ENT: Positive sore throat and white patches on tongue.  Cardiovascular: Denies chest pain. Respiratory: Denies shortness of breath. Gastrointestinal: No abdominal pain.  No nausea, no vomiting.  No diarrhea.  No constipation. Genitourinary: Negative for  dysuria. Musculoskeletal: Negative for back pain. Pain at the right stump.  Skin: Chronic decubitus ulcer and chronic osteo with worsening skin breakdown.  Neurological: Negative for headaches, focal weakness or numbness.  10-point ROS otherwise negative.  ____________________________________________   PHYSICAL EXAM:  VITAL SIGNS: Vitals:   05/31/20 0600 05/31/20 0630  BP: 105/90 117/66  Pulse: (!) 116 (!) 115  Resp: (!) 22 (!) 23  Temp:    SpO2: 96% 97%   Constitutional: Alert and oriented. Well appearing and in no acute distress. Eyes: Conjunctivae are normal. Head: Atraumatic. Nose: No congestion/rhinnorhea. Mouth/Throat: Mucous membranes are dry.  Patient with oral thrush.  No peritonsillar abscess visualized. Neck: No stridor.  Cardiovascular: Tachycardia. Good peripheral circulation. Grossly normal heart sounds.   Respiratory: Normal respiratory effort.  No retractions. Lungs CTAB. Gastrointestinal: Soft and nontender. No distention. Rectal exam performed with patient's verbal consent and nurse tech chaperone. No gross blood or melena.  Musculoskeletal: Right AKA without erythema, skin breakdown, or drainage.  Neurologic:  Normal speech and language. No gross focal neurologic deficits are appreciated.  Skin: Patient has a large sacral decubitus wound covered mostly with granulation tissue.  There is some darkening of the skin on the right buttock but no crepitus.  There is surrounding erythema with an overlying whitish discoloration and this extends to the scrotum and inguinal creases consistent with Candida.   ____________________________________________   LABS (all labs ordered are listed, but only abnormal results are displayed)  Labs Reviewed  COMPREHENSIVE METABOLIC PANEL - Abnormal; Notable for the following components:      Result Value   Sodium 127 (*)    Chloride 96 (*)    CO2 19 (*)    Calcium 7.8 (*)    Total Protein 6.4 (*)    Albumin 1.8 (*)    AST  13 (*)    All other components within normal limits  CBC WITH DIFFERENTIAL/PLATELET - Abnormal; Notable for the following components:   WBC 12.9 (*)    RBC 4.04 (*)    Hemoglobin 7.8 (*)    HCT 26.1 (*)    MCV 64.6 (*)    MCH 19.3 (*)    MCHC 29.9 (*)    RDW 21.4 (*)    Platelets 861 (*)    Neutro Abs 10.6 (*)    Monocytes Absolute 1.1 (*)    Abs Immature Granulocytes 0.18 (*)    All other components within normal limits  C-REACTIVE PROTEIN - Abnormal; Notable for the following components:   CRP 19.2 (*)    All other components within normal limits  FOLATE - Abnormal; Notable for the following components:   Folate 3.3 (*)    All other components within normal limits  IRON AND TIBC - Abnormal; Notable for the following components:   Iron <5 (*)    TIBC 138 (*)    Saturation Ratios 3 (*)    All other components within normal  limits  RETICULOCYTES - Abnormal; Notable for the following components:   RBC. 4.05 (*)    Immature Retic Fract 30.4 (*)    All other components within normal limits  BASIC METABOLIC PANEL - Abnormal; Notable for the following components:   Sodium 131 (*)    Potassium 3.4 (*)    CO2 17 (*)    Creatinine, Ser 0.58 (*)    Calcium 7.6 (*)    All other components within normal limits  HEMOGLOBIN A1C - Abnormal; Notable for the following components:   Hgb A1c MFr Bld 6.2 (*)    All other components within normal limits  CBC WITH DIFFERENTIAL/PLATELET - Abnormal; Notable for the following components:   WBC 16.1 (*)    RBC 3.79 (*)    Hemoglobin 7.3 (*)    HCT 24.2 (*)    MCV 63.9 (*)    MCH 19.3 (*)    RDW 21.2 (*)    Platelets 895 (*)    Neutro Abs 14.7 (*)    Lymphs Abs 0.6 (*)    Abs Immature Granulocytes 0.22 (*)    All other components within normal limits  CULTURE, BLOOD (ROUTINE X 2)  CULTURE, BLOOD (ROUTINE X 2)  SARS CORONAVIRUS 2 BY RT PCR (HOSPITAL ORDER, [place] [facility])  C DIFFICILE QUICK SCREEN W PCR REFLEX   LIPASE, BLOOD  LACTIC ACID, PLASMA  PROTIME-INR  VITAMIN B12  FERRITIN  CBC WITH DIFFERENTIAL/PLATELET  POC OCCULT BLOOD, ED  CBG MONITORING, ED  TYPE AND SCREEN   ____________________________________________  EKG   EKG Interpretation  Date/Time:  Tuesday May 30 2020 21:32:54 EDT Ventricular Rate:  103 PR Interval:    QRS Duration: 94 QT Interval:  326 QTC Calculation: 427 R Axis:   -23 Text Interpretation: Sinus tachycardia Borderline left axis deviation Abnormal R-wave progression, early transition Nonspecific T abnrm, anterolateral leads When compared with ECG of 04/15/2018, Sinus tachycardia has replaced Atrial fibrillation with rapid ventricular response Confirmed by Delora Fuel ([id number]) on 05/31/2020 6:28:52 AM       ____________________________________________  RADIOLOGY  CT ABDOMEN PELVIS W CONTRAST  Result Date: 05/31/2020 CLINICAL DATA:  Abdominal infection. Chronic osteomyelitis. Worsening erythema. EXAM: CT ABDOMEN AND PELVIS WITH CONTRAST TECHNIQUE: Multidetector CT imaging of the abdomen and pelvis was performed using the standard protocol following bolus administration of intravenous contrast. CONTRAST:  173mL OMNIPAQUE IOHEXOL 300 MG/ML  SOLN COMPARISON:  CT abdomen pelvis 11/10/2008 FINDINGS: LOWER CHEST: Normal. HEPATOBILIARY: Normal hepatic contours. No intra- or extrahepatic biliary dilatation. The gallbladder is normal. PANCREAS: Normal pancreas. No ductal dilatation or peripancreatic fluid collection. SPLEEN: Normal. ADRENALS/URINARY TRACT: The adrenal glands are normal. There are numerous bilateral renal cysts. The largest on the right measures 5.3 cm and the largest on the left measures 4.4 cm. No hydronephrosis. Decompressed by Foley catheter. STOMACH/BOWEL: There is no hiatal hernia. Normal duodenal course and caliber. No small bowel dilatation or inflammation. There is ulceration extending to the skin surface dorsal to the rectum. There appears to be a  sinus tract extending from the rectum to the external environment. Normal appendix. VASCULAR/LYMPHATIC: There is calcific atherosclerosis of the abdominal aorta. No abdominal or pelvic lymphadenopathy. REPRODUCTIVE: Normal prostate size with symmetric seminal vesicles. MUSCULOSKELETAL. There is a right ischial decubitus ulcer. The bone surface extends to the area of ulceration. There is multifocal lucency within the right ischial tuberosity. There is a sacral decubitus ulcer that abuts the dorsal surface of the distal sacrum. There is no  definite sacral osteolysis. OTHER: None. IMPRESSION: 1. Ulceration dorsal to the distal rectum with suspected sinus tract extending to the skin surface and external environment. This has been present on multiple prior examinations, extending back to 2009, but has increased in size. 2. Right ischial decubitus ulcer with multifocal lucency within the right ischial tuberosity, consistent with osteomyelitis. 3. Sacral decubitus ulcer that abuts the dorsal surface of the distal sacrum without definite sacral osteolysis. 4. Aortic Atherosclerosis (ICD10-I70.0). Electronically Signed   By: Ulyses Jarred M.D.   On: 05/31/2020 00:32   DG Chest Portable 1 View  Result Date: 05/30/2020 CLINICAL DATA:  Shortness of breath. EXAM: PORTABLE CHEST 1 VIEW COMPARISON:  Apr 15, 2018 FINDINGS: Mild, diffuse chronic appearing increased lung markings are seen without evidence of acute infiltrate, pleural effusion or pneumothorax. The heart size and mediastinal contours are within normal limits. Radiopaque pedicle screws are seen overlying the visualized portion of the cervical spine. A radiopaque fusion plate and screws are also seen overlying the cervical spine. The visualized skeletal structures are otherwise unremarkable. IMPRESSION: No active disease. Electronically Signed   By: Virgina Norfolk M.D.   On: 05/30/2020 22:25     ____________________________________________   PROCEDURES  Procedure(s) performed:   Procedures  CRITICAL CARE Performed by: Margette Fast Total critical care time: 35 minutes Critical care time was exclusive of separately billable procedures and treating other patients. Critical care was necessary to treat or prevent imminent or life-threatening deterioration. Critical care was time spent personally by me on the following activities: development of treatment plan with patient and/or surrogate as well as nursing, discussions with consultants, evaluation of patient's response to treatment, examination of patient, obtaining history from patient or surrogate, ordering and performing treatments and interventions, ordering and review of laboratory studies, ordering and review of radiographic studies, pulse oximetry and re-evaluation of patient's condition.  Nanda Quinton, MD Emergency Medicine  ____________________________________________   INITIAL IMPRESSION / ASSESSMENT AND PLAN / ED COURSE  Pertinent labs & imaging results that were available during my care of the patient were reviewed by me and considered in my medical decision making (see chart for details).   Patient with chronic osteomyelitis presents with clinical moderate to severe dehydration.  He is tachycardic here with oral thrush.  His chronic osteomyelitis appears erythematous and he has developed a candidal type rash in the inguinal creases and around the wound.  Will send C. difficile culture and start IV fluids.   Wife has returned with abx and patient has been on bactrim.  Initial lab work showing leukocytosis to 12.9 with hemoglobin of 7.8 and MCV of 64.  Patient also with hyponatremia to 127.  No acute kidney injury.  I have sent a Hemoccult but did not see gross blood or melena on exam.  Have also sent an anemia panel along with type and screen.  Discussed the possibility of blood transfusion if hemoglobin continues  to drift lower and patient is okay receiving this.  He is not hypotensive.  Will send for contrast CT of the abdomen and pelvis to further assess the patient's underlying chronic osteomyelitis and rule out deeper space abscess. Continue IVF for now. Hold on additional abx pending CT. Wound culture in Care Everywhere shows Proteus and Staph aureus resistant only to Penicillin and Erythromycin.   CT abdomen/pelvis pending. Care transferred to Dr. Wyvonnia Dusky pending CT and likely admit for wound care and dehydration.  ____________________________________________  FINAL CLINICAL IMPRESSION(S) / ED DIAGNOSES  Final diagnoses:  Dehydration  Sacral osteomyelitis (HCC)     MEDICATIONS GIVEN DURING THIS VISIT:  Medications  enoxaparin (LOVENOX) injection 40 mg (has no administration in time range)  0.9 %  sodium chloride infusion ( Intravenous New Bag/Given 05/31/20 0356)  amLODipine (NORVASC) tablet 10 mg (has no administration in time range)  diclofenac Sodium (VOLTAREN) 1 % topical gel 2 g (has no administration in time range)  ezetimibe (ZETIA) tablet 10 mg (has no administration in time range)  gabapentin (NEURONTIN) capsule 600 mg (has no administration in time range)  insulin glargine (LANTUS) injection 18 Units (has no administration in time range)  pantoprazole (PROTONIX) EC tablet 40 mg (has no administration in time range)  methocarbamol (ROBAXIN) tablet 500 mg (has no administration in time range)  insulin aspart (novoLOG) injection 0-15 Units (0 Units Subcutaneous Not Given 05/31/20 0851)  vancomycin (VANCOCIN) IVPB 1000 mg/200 mL premix (has no administration in time range)  meropenem (MERREM) 1 g in sodium chloride 0.9 % 100 mL IVPB (has no administration in time range)  sodium chloride 0.9 % bolus 1,000 mL (0 mLs Intravenous Stopped 05/31/20 0114)  nystatin (MYCOSTATIN) 100000 UNIT/ML suspension 500,000 Units (500,000 Units Oral Given 05/31/20 0012)  fluconazole (DIFLUCAN) tablet 150 mg  (150 mg Oral Given 05/31/20 0012)  iohexol (OMNIPAQUE) 300 MG/ML solution 100 mL (100 mLs Intravenous Contrast Given 05/30/20 2351)  vancomycin (VANCOREADY) IVPB 1500 mg/300 mL (0 mg Intravenous Stopped 05/31/20 0327)  meropenem (MERREM) 1 g in sodium chloride 0.9 % 100 mL IVPB (0 g Intravenous Stopped 05/31/20 0327)     Note:  This document was prepared using Dragon voice recognition software and may include unintentional dictation errors.  Nanda Quinton, MD, [facility] Emergency Medicine    Annalyse Langlais, [facility], MD 05/31/20 [phone]

## 2020-05-31 DIAGNOSIS — G8220 Paraplegia, unspecified: Secondary | ICD-10-CM

## 2020-05-31 DIAGNOSIS — B372 Candidiasis of skin and nail: Secondary | ICD-10-CM | POA: Diagnosis present

## 2020-05-31 DIAGNOSIS — L989 Disorder of the skin and subcutaneous tissue, unspecified: Secondary | ICD-10-CM | POA: Diagnosis not present

## 2020-05-31 DIAGNOSIS — G546 Phantom limb syndrome with pain: Secondary | ICD-10-CM | POA: Diagnosis not present

## 2020-05-31 DIAGNOSIS — L304 Erythema intertrigo: Secondary | ICD-10-CM | POA: Diagnosis present

## 2020-05-31 DIAGNOSIS — L89310 Pressure ulcer of right buttock, unstageable: Secondary | ICD-10-CM

## 2020-05-31 DIAGNOSIS — M86659 Other chronic osteomyelitis, unspecified thigh: Secondary | ICD-10-CM

## 2020-05-31 DIAGNOSIS — E0800 Diabetes mellitus due to underlying condition with hyperosmolarity without nonketotic hyperglycemic-hyperosmolar coma (NKHHC): Secondary | ICD-10-CM | POA: Diagnosis not present

## 2020-05-31 DIAGNOSIS — H269 Unspecified cataract: Secondary | ICD-10-CM | POA: Diagnosis present

## 2020-05-31 DIAGNOSIS — Z7401 Bed confinement status: Secondary | ICD-10-CM | POA: Diagnosis not present

## 2020-05-31 DIAGNOSIS — I10 Essential (primary) hypertension: Secondary | ICD-10-CM | POA: Diagnosis not present

## 2020-05-31 DIAGNOSIS — L89314 Pressure ulcer of right buttock, stage 4: Secondary | ICD-10-CM | POA: Diagnosis not present

## 2020-05-31 DIAGNOSIS — R54 Age-related physical debility: Secondary | ICD-10-CM | POA: Diagnosis present

## 2020-05-31 DIAGNOSIS — E860 Dehydration: Secondary | ICD-10-CM | POA: Diagnosis present

## 2020-05-31 DIAGNOSIS — L89159 Pressure ulcer of sacral region, unspecified stage: Secondary | ICD-10-CM | POA: Diagnosis not present

## 2020-05-31 DIAGNOSIS — G894 Chronic pain syndrome: Secondary | ICD-10-CM | POA: Diagnosis not present

## 2020-05-31 DIAGNOSIS — Z9181 History of falling: Secondary | ICD-10-CM | POA: Diagnosis not present

## 2020-05-31 DIAGNOSIS — E785 Hyperlipidemia, unspecified: Secondary | ICD-10-CM

## 2020-05-31 DIAGNOSIS — K120 Recurrent oral aphthae: Secondary | ICD-10-CM | POA: Diagnosis present

## 2020-05-31 DIAGNOSIS — R197 Diarrhea, unspecified: Secondary | ICD-10-CM | POA: Diagnosis present

## 2020-05-31 DIAGNOSIS — R269 Unspecified abnormalities of gait and mobility: Secondary | ICD-10-CM | POA: Diagnosis present

## 2020-05-31 DIAGNOSIS — F1120 Opioid dependence, uncomplicated: Secondary | ICD-10-CM

## 2020-05-31 DIAGNOSIS — R531 Weakness: Secondary | ICD-10-CM | POA: Diagnosis not present

## 2020-05-31 DIAGNOSIS — L98419 Non-pressure chronic ulcer of buttock with unspecified severity: Secondary | ICD-10-CM | POA: Diagnosis not present

## 2020-05-31 DIAGNOSIS — Z720 Tobacco use: Secondary | ICD-10-CM | POA: Diagnosis not present

## 2020-05-31 DIAGNOSIS — Z9350 Unspecified cystostomy status: Secondary | ICD-10-CM | POA: Diagnosis not present

## 2020-05-31 DIAGNOSIS — E1169 Type 2 diabetes mellitus with other specified complication: Secondary | ICD-10-CM | POA: Diagnosis not present

## 2020-05-31 DIAGNOSIS — M8668 Other chronic osteomyelitis, other site: Secondary | ICD-10-CM | POA: Diagnosis present

## 2020-05-31 DIAGNOSIS — D62 Acute posthemorrhagic anemia: Secondary | ICD-10-CM | POA: Diagnosis present

## 2020-05-31 DIAGNOSIS — Z20822 Contact with and (suspected) exposure to covid-19: Secondary | ICD-10-CM | POA: Diagnosis present

## 2020-05-31 DIAGNOSIS — M8638 Chronic multifocal osteomyelitis, other site: Secondary | ICD-10-CM | POA: Diagnosis not present

## 2020-05-31 DIAGNOSIS — Z89611 Acquired absence of right leg above knee: Secondary | ICD-10-CM | POA: Diagnosis not present

## 2020-05-31 DIAGNOSIS — K219 Gastro-esophageal reflux disease without esophagitis: Secondary | ICD-10-CM | POA: Diagnosis present

## 2020-05-31 DIAGNOSIS — D638 Anemia in other chronic diseases classified elsewhere: Secondary | ICD-10-CM | POA: Diagnosis present

## 2020-05-31 DIAGNOSIS — M4628 Osteomyelitis of vertebra, sacral and sacrococcygeal region: Secondary | ICD-10-CM

## 2020-05-31 DIAGNOSIS — E871 Hypo-osmolality and hyponatremia: Secondary | ICD-10-CM | POA: Diagnosis present

## 2020-05-31 DIAGNOSIS — Z794 Long term (current) use of insulin: Secondary | ICD-10-CM | POA: Diagnosis not present

## 2020-05-31 DIAGNOSIS — B370 Candidal stomatitis: Secondary | ICD-10-CM | POA: Diagnosis present

## 2020-05-31 DIAGNOSIS — E1136 Type 2 diabetes mellitus with diabetic cataract: Secondary | ICD-10-CM | POA: Diagnosis present

## 2020-05-31 DIAGNOSIS — E43 Unspecified severe protein-calorie malnutrition: Secondary | ICD-10-CM | POA: Diagnosis not present

## 2020-05-31 DIAGNOSIS — Z993 Dependence on wheelchair: Secondary | ICD-10-CM | POA: Diagnosis not present

## 2020-05-31 LAB — CBC WITH DIFFERENTIAL/PLATELET
Abs Immature Granulocytes: 0.22 10*3/uL — ABNORMAL HIGH (ref 0.00–0.07)
Basophils Absolute: 0 10*3/uL (ref 0.0–0.1)
Basophils Relative: 0 %
Eosinophils Absolute: 0 10*3/uL (ref 0.0–0.5)
Eosinophils Relative: 0 %
HCT: 24.2 % — ABNORMAL LOW (ref 39.0–52.0)
Hemoglobin: 7.3 g/dL — ABNORMAL LOW (ref 13.0–17.0)
Immature Granulocytes: 1 %
Lymphocytes Relative: 4 %
Lymphs Abs: 0.6 10*3/uL — ABNORMAL LOW (ref 0.7–4.0)
MCH: 19.3 pg — ABNORMAL LOW (ref 26.0–34.0)
MCHC: 30.2 g/dL (ref 30.0–36.0)
MCV: 63.9 fL — ABNORMAL LOW (ref 80.0–100.0)
Monocytes Absolute: 0.6 10*3/uL (ref 0.1–1.0)
Monocytes Relative: 3 %
Neutro Abs: 14.7 10*3/uL — ABNORMAL HIGH (ref 1.7–7.7)
Neutrophils Relative %: 92 %
Platelets: 895 10*3/uL — ABNORMAL HIGH (ref 150–400)
RBC: 3.79 MIL/uL — ABNORMAL LOW (ref 4.22–5.81)
RDW: 21.2 % — ABNORMAL HIGH (ref 11.5–15.5)
WBC: 16.1 10*3/uL — ABNORMAL HIGH (ref 4.0–10.5)
nRBC: 0 % (ref 0.0–0.2)

## 2020-05-31 LAB — BASIC METABOLIC PANEL
Anion gap: 14 (ref 5–15)
BUN: 11 mg/dL (ref 8–23)
CO2: 17 mmol/L — ABNORMAL LOW (ref 22–32)
Calcium: 7.6 mg/dL — ABNORMAL LOW (ref 8.9–10.3)
Chloride: 100 mmol/L (ref 98–111)
Creatinine, Ser: 0.58 mg/dL — ABNORMAL LOW (ref 0.61–1.24)
GFR calc Af Amer: >60 mL/min (ref >60)
GFR calc non Af Amer: >60 mL/min (ref >60)
Glucose, Bld: 80 mg/dL (ref 70–99)
Potassium: 3.4 mmol/L — ABNORMAL LOW (ref 3.5–5.1)
Sodium: 131 mmol/L — ABNORMAL LOW (ref 135–145)

## 2020-05-31 LAB — IRON AND TIBC
Iron: <5 ug/dL — ABNORMAL LOW (ref 45–182)
Saturation Ratios: 3 % — ABNORMAL LOW (ref 17.9–39.5)
TIBC: 138 ug/dL — ABNORMAL LOW (ref 250–450)
UIBC: 134 ug/dL

## 2020-05-31 LAB — CBG MONITORING, ED
Glucose-Capillary: 78 mg/dL (ref 70–99)
Glucose-Capillary: 89 mg/dL (ref 70–99)

## 2020-05-31 LAB — FOLATE: Folate: 3.3 ng/mL — ABNORMAL LOW (ref >5.9)

## 2020-05-31 LAB — SARS CORONAVIRUS 2 BY RT PCR (HOSPITAL ORDER, PERFORMED IN ~~LOC~~ HOSPITAL LAB): SARS Coronavirus 2: NEGATIVE

## 2020-05-31 LAB — FERRITIN: Ferritin: 212 ng/mL (ref 24–336)

## 2020-05-31 LAB — VITAMIN B12: Vitamin B-12: 583 pg/mL (ref 180–914)

## 2020-05-31 LAB — HEMOGLOBIN A1C
Hgb A1c MFr Bld: 6.2 % — ABNORMAL HIGH (ref 4.8–5.6)
Mean Plasma Glucose: 131.24 mg/dL

## 2020-05-31 LAB — GLUCOSE, CAPILLARY: Glucose-Capillary: 111 mg/dL — ABNORMAL HIGH (ref 70–99)

## 2020-05-31 MED ORDER — SODIUM CHLORIDE 0.9 % IV SOLN
1.0000 g | Freq: Once | INTRAVENOUS | Status: AC
  Administered 2020-05-31: 1 g via INTRAVENOUS
  Filled 2020-05-31: qty 1

## 2020-05-31 MED ORDER — SODIUM CHLORIDE 0.9 % IV SOLN: INTRAVENOUS | Status: DC

## 2020-05-31 MED ORDER — EZETIMIBE 10 MG PO TABS
10.0000 mg | ORAL_TABLET | Freq: Every day | ORAL | Status: DC
  Administered 2020-05-31 – 2020-06-03 (×4): 10 mg via ORAL
  Filled 2020-05-31 (×6): qty 1

## 2020-05-31 MED ORDER — GABAPENTIN 300 MG PO CAPS
600.0000 mg | ORAL_CAPSULE | Freq: Two times a day (BID) | ORAL | Status: DC
  Administered 2020-05-31 – 2020-06-03 (×7): 600 mg via ORAL
  Filled 2020-05-31 (×7): qty 2

## 2020-05-31 MED ORDER — ENOXAPARIN SODIUM 40 MG/0.4ML ~~LOC~~ SOLN
40.0000 mg | SUBCUTANEOUS | Status: DC
  Administered 2020-05-31 – 2020-06-03 (×4): 40 mg via SUBCUTANEOUS
  Filled 2020-05-31 (×4): qty 0.4

## 2020-05-31 MED ORDER — SACCHAROMYCES BOULARDII 250 MG PO CAPS
250.0000 mg | ORAL_CAPSULE | Freq: Two times a day (BID) | ORAL | Status: DC
  Administered 2020-05-31 – 2020-06-03 (×6): 250 mg via ORAL
  Filled 2020-05-31 (×6): qty 1

## 2020-05-31 MED ORDER — VANCOMYCIN HCL IN DEXTROSE 1-5 GM/200ML-% IV SOLN
1000.0000 mg | Freq: Two times a day (BID) | INTRAVENOUS | Status: DC
  Administered 2020-05-31 – 2020-06-01 (×2): 1000 mg via INTRAVENOUS
  Filled 2020-05-31 (×2): qty 200

## 2020-05-31 MED ORDER — PANTOPRAZOLE SODIUM 40 MG PO TBEC
40.0000 mg | DELAYED_RELEASE_TABLET | Freq: Every day | ORAL | Status: DC
  Administered 2020-05-31 – 2020-06-03 (×4): 40 mg via ORAL
  Filled 2020-05-31 (×4): qty 1

## 2020-05-31 MED ORDER — SODIUM CHLORIDE 0.9 % IV SOLN
1.0000 g | Freq: Three times a day (TID) | INTRAVENOUS | Status: DC
  Administered 2020-05-31 – 2020-06-01 (×3): 1 g via INTRAVENOUS
  Filled 2020-05-31 (×4): qty 1

## 2020-05-31 MED ORDER — AMLODIPINE BESYLATE 5 MG PO TABS
10.0000 mg | ORAL_TABLET | Freq: Every day | ORAL | Status: DC
  Administered 2020-05-31 – 2020-06-03 (×3): 10 mg via ORAL
  Filled 2020-05-31 (×4): qty 2

## 2020-05-31 MED ORDER — INSULIN GLARGINE 100 UNIT/ML ~~LOC~~ SOLN
18.0000 [IU] | Freq: Every day | SUBCUTANEOUS | Status: DC
  Administered 2020-05-31 – 2020-06-02 (×3): 18 [IU] via SUBCUTANEOUS
  Filled 2020-05-31 (×5): qty 0.18

## 2020-05-31 MED ORDER — INSULIN ASPART 100 UNIT/ML ~~LOC~~ SOLN
0.0000 [IU] | Freq: Three times a day (TID) | SUBCUTANEOUS | Status: DC
  Administered 2020-06-01: 2 [IU] via SUBCUTANEOUS
  Administered 2020-06-01: 3 [IU] via SUBCUTANEOUS
  Administered 2020-06-02: 2 [IU] via SUBCUTANEOUS

## 2020-05-31 MED ORDER — FENTANYL CITRATE (PF) 100 MCG/2ML IJ SOLN
25.0000 ug | INTRAMUSCULAR | Status: DC | PRN
  Administered 2020-05-31 – 2020-06-03 (×18): 25 ug via INTRAVENOUS
  Filled 2020-05-31 (×18): qty 2

## 2020-05-31 MED ORDER — DICLOFENAC SODIUM 1 % EX GEL
2.0000 g | Freq: Four times a day (QID) | CUTANEOUS | Status: DC | PRN
  Administered 2020-05-31: 2 g via TOPICAL
  Filled 2020-05-31: qty 100

## 2020-05-31 MED ORDER — VANCOMYCIN HCL 1500 MG/300ML IV SOLN
1500.0000 mg | Freq: Once | INTRAVENOUS | Status: AC
  Administered 2020-05-31: 1500 mg via INTRAVENOUS
  Filled 2020-05-31: qty 300

## 2020-05-31 MED ORDER — METHOCARBAMOL 500 MG PO TABS: 500.0000 mg | ORAL_TABLET | Freq: Two times a day (BID) | ORAL | Status: DC | PRN

## 2020-05-31 NOTE — Progress Notes (Signed)
Pharmacy Antibiotic Note  Shawn [name]. is a 69 y.o. male with h/o paraplegia admitted on 05/30/2020 with possible worsening infection of chronic decubitus ulcer.  Pharmacy has been consulted for Vancomycin and Meropenem dosing.  Plan: Vancomycin 1500 mg IV x 1, then Vancomycin 1000 mg IV q12h Meropenem 1 g IV q8h  Height: 6\' 2"  (188 cm) Weight: 74.8 kg (165 lb) IBW/kg (Calculated) : 82.2  Temp (24hrs), Avg:99.4 F (37.4 C), Min:99.4 F (37.4 C), Max:99.4 F (37.4 C)  Recent Labs  Lab 05/30/20 2201  WBC 12.9*  CREATININE 0.63  LATICACIDVEN 1.1    Estimated Creatinine Clearance: 90.9 mL/min (by C-G formula based on SCr of 0.63 mg/dL).    Allergies  Allergen Reactions  . Penicillins Hives  . Statins     Uncontrolled diarrhea   Caryl Pina 05/31/2020 5:04 AM

## 2020-05-31 NOTE — H&P (Signed)
History and Physical  [place]. WYO:[id number] DOB: 1951-08-23 DOA: 05/30/2020  PCP: [name], MD  Patient coming from: Home   I have personally briefly reviewed patient's old medical records in [place]  Chief Complaint: wound infection   HPI: Shawn [name]. is a 69 y.o. male paraplegic with medical history significant for chronic pelvic osteomyelitis and chronic right ischial decubitus ulcer extending to the bone presents to eD complaining of mouth pain, sore throat, not tolerating orals well, worsening decubitus wounds despite being on a course of Bactrim that was started outpatient after he had a punch biopsy done and the wound culture results grew bacteria sensitive to Bactrim.  He was supposed to be on the Bactrim for total of 10 days.  He had also been referred to infectious disease.  He apparently has developed some watery diarrhea over the past several days since being on the antibiotics.  He has lost about 20 pounds of weight loss.  The plan was initially to transfer him to [facility] where he receives his medical care but they do not have any beds available.  Patient was admitted to [facility] for IV antibiotics and IV fluids.  The punch biopsy wound culture grew Proteus mirabilis and staph aureus.  Review of Systems: As per HPI otherwise 10 point review of systems negative.   Past Medical History:  Diagnosis Date  . Allergy   . Arthritis   . C. difficile colitis 11/2008  . Decubitus ulcer 1999   excision of right ischial pressure sore with flap reconstruction done by Dr. Denese Killings 10/31/2008  . Diabetic ketosis ([place]) 05/05/2019  . GERD (gastroesophageal reflux disease)   . Hyperlipidemia   . Hypertension   . Paraplegia ([place])    secondary to [place]  . Perineal abscess   . Substance abuse ([place])    alcohol    Past Surgical History:  Procedure Laterality Date  . BONE BIOPSY  11/2007   negative for organisms  . COLONOSCOPY WITH  PROPOFOL N/A 08/27/2017   Procedure: COLONOSCOPY WITH PROPOFOL;  Surgeon: Irene Shipper, MD;  Location: [facility];  Service: Endoscopy;  Laterality: N/A;  . sacral wound flap  2002   done by Dr. Stephanie Coup  . SPINE SURGERY    . THROAT SURGERY       reports that he has been smoking cigarettes. He has a 20.00 pack-year smoking history. He has never used smokeless tobacco. He reports that he does not drink alcohol and does not use drugs.  Allergies  Allergen Reactions  . Penicillins Hives  . Statins     Uncontrolled diarrhea    Family History  Problem Relation Age of Onset  . Stroke Mother   . Coronary artery disease Father   . Coronary artery disease Paternal Uncle   . Coronary artery disease Paternal Aunt      Prior to Admission medications   Medication Sig Start Date End Date Taking? Authorizing Provider  amLODipine (NORVASC) 10 MG tablet Take 1 tablet (10 mg total) by mouth daily. 02/14/20  Yes Helberg, Larkin Ina, MD  diclofenac sodium (VOLTAREN) 1 % GEL APPLY TOPICALLY 4 TIMES DAILY. Patient taking differently: Apply 2 g topically 4 (four) times daily as needed (pain).  11/13/17  Yes Jamse Arn, MD  ezetimibe (ZETIA) 10 MG tablet TAKE 1 TABLET BY MOUTH EVERY DAY Patient taking differently: Take 10 mg by mouth daily.  05/15/20  Yes Axel Filler, MD  gabapentin (NEURONTIN) 300 MG capsule Take 2 capsules (600 mg total) by mouth 2 (two) times daily. 03/09/20  Yes Patel, Domenick Bookbinder, MD  hydrochlorothiazide (HYDRODIURIL) 25 MG tablet TAKE 1 TABLET BY MOUTH EVERY DAY Patient taking differently: Take 25 mg by mouth daily.  05/16/20  Yes Velna Ochs, MD  LANTUS 100 UNIT/ML injection INJECT (18 UNITS TOTAL) INTO THE SKIN DAILY. Patient taking differently: Inject 18 Units into the skin at bedtime.  02/14/20  Yes Helberg, Larkin Ina, MD  methocarbamol (ROBAXIN) 500 MG tablet TAKE 1 TABLET BY MOUTH 2 TIMES DAILY AS NEEDED FOR MUSCLE SPASMS. Patient taking differently: Take 500 mg by  mouth 2 (two) times daily as needed for muscle spasms.  09/15/19  Yes Jamse Arn, MD  Nutritional Supplements (ENSURE COMPACT) LIQD Take 237 mLs by mouth 3 (three) times daily. Ensure Premier---only 1gm of carbs per 8 0z   Yes [provider]  omeprazole (PRILOSEC) 40 MG capsule TAKE 1 CAPSULE BY MOUTH EVERY DAY Patient taking differently: Take 40 mg by mouth daily.  05/19/20  Yes Lucious Groves, DO  sulfamethoxazole-trimethoprim (BACTRIM DS) 800-160 MG tablet Take 1 tablet by mouth 2 (two) times daily. 10 day course starting on 05/22/2020   Yes [provider]  triamcinolone cream (KENALOG) 0.1 % Apply 1 application topically 2 (two) times daily as needed. For rash 05/14/20  Yes [provider]    Physical Exam: Vitals:   05/31/20 1434 05/31/20 1435 05/31/20 1436 05/31/20 1437  BP:      Pulse:      Resp: 20 20 16 20   Temp:      TempSrc:      SpO2:      Weight:      Height:        Constitutional: chronically ill appearing male, NAD, calm, comfortable Eyes: PERRL, lids and conjunctivae normal ENMT: Mucous membranes are moist. Posterior pharynx clear of any exudate or lesions.  Neck: normal, supple, no masses, no thyromegaly Respiratory: clear to auscultation bilaterally, no wheezing, no crackles. Normal respiratory effort. No accessory muscle use.  Cardiovascular: Regular rate and rhythm, no murmurs / rubs / gallops. No extremity edema. 2+ pedal pulses. No carotid bruits.  Abdomen: no tenderness, no masses palpated. No hepatosplenomegaly. Bowel sounds positive.  Musculoskeletal: right AKA, well healed, no clubbing / cyanosis. No joint deformity upper and lower extremities. Good ROM, no contractures. Normal muscle tone.  Skin: large sacral decubitus ulcer, candidal intertrigo seen in groin area.  Neurologic: paraplegic male Psychiatric: Normal judgment and insight. Alert and oriented x 3. Normal mood.   Labs on Admission: I have personally reviewed  following labs and imaging studies  CBC: Recent Labs  Lab 05/30/20 2201 05/31/20 0734  WBC 54.0* 08.6*  DUPLICATE REQUEST  NEUTROABS 10.6* 14.7*  PENDING  HGB 7.8* 7.3*  DUPLICATE REQUEST  HCT 76.1* 95.0*  DUPLICATE REQUEST  MCV 93.2* 67.1*  DUPLICATE REQUEST  PLT 245* 809*  DUPLICATE REQUEST   Basic Metabolic Panel: Recent Labs  Lab 05/30/20 2201 05/31/20 0734  NA 127* 131*  K 4.2 3.4*  CL 96* 100  CO2 19* 17*  GLUCOSE 94 80  BUN 13 11  CREATININE 0.63 0.58*  CALCIUM 7.8* 7.6*   GFR: Estimated Creatinine Clearance: 90.9 mL/min (A) (by C-G formula based on SCr of 0.58 mg/dL (L)). Liver Function Tests: Recent Labs  Lab 05/30/20 2201  AST 13*  ALT 19  ALKPHOS 91  BILITOT 0.5  PROT 6.4*  ALBUMIN 1.8*  Recent Labs  Lab 05/30/20 2201  LIPASE 14   No results for input(s): AMMONIA in the last 168 hours. Coagulation Profile: Recent Labs  Lab 05/30/20 2201  INR 1.2   Cardiac Enzymes: No results for input(s): CKTOTAL, CKMB, CKMBINDEX, TROPONINI in the last 168 hours. BNP (last 3 results) No results for input(s): PROBNP in the last 8760 hours. HbA1C: Recent Labs    05/30/20 2338  HGBA1C 6.2*   CBG: Recent Labs  Lab 05/31/20 0849 05/31/20 1135  GLUCAP 78 89   Lipid Profile: No results for input(s): CHOL, HDL, LDLCALC, TRIG, CHOLHDL, LDLDIRECT in the last 72 hours. Thyroid Function Tests: No results for input(s): TSH, T4TOTAL, FREET4, T3FREE, THYROIDAB in the last 72 hours. Anemia Panel: Recent Labs    05/30/20 2201  VITAMINB12 583  FOLATE 3.3*  FERRITIN 212  TIBC 138*  IRON <5*  RETICCTPCT 1.5   Urine analysis:    Component Value Date/Time   COLORURINE YELLOW 05/05/2019 1645   APPEARANCEUR CLOUDY (A) 05/05/2019 1645   LABSPEC 1.018 05/05/2019 1645   PHURINE 9.0 (H) 05/05/2019 1645   GLUCOSEU >=500 (A) 05/05/2019 1645   GLUCOSEU NEG mg/dL 07/27/2008 2043   HGBUR MODERATE (A) 05/05/2019 1645   BILIRUBINUR NEGATIVE 05/05/2019 1645    BILIRUBINUR Negative 05/05/2019 1411   KETONESUR 5 (A) 05/05/2019 1645   PROTEINUR NEGATIVE 05/05/2019 1645   PROTEINUR Positive (A) 05/05/2019 1411   UROBILINOGEN 0.2 05/05/2019 1411   UROBILINOGEN 0.2 06/30/2011 2000   NITRITE POSITIVE (A) 05/05/2019 1645   NITRITE Negative 05/05/2019 1411   LEUKOCYTESUR LARGE (A) 05/05/2019 1645   LEUKOCYTESUR Small (1+) (A) 05/05/2019 1411    Radiological Exams on Admission: CT ABDOMEN PELVIS W CONTRAST  Result Date: 05/31/2020 CLINICAL DATA:  Abdominal infection. Chronic osteomyelitis. Worsening erythema. EXAM: CT ABDOMEN AND PELVIS WITH CONTRAST TECHNIQUE: Multidetector CT imaging of the abdomen and pelvis was performed using the standard protocol following bolus administration of intravenous contrast. CONTRAST:  172mL OMNIPAQUE IOHEXOL 300 MG/ML  SOLN COMPARISON:  CT abdomen pelvis 11/10/2008 FINDINGS: LOWER CHEST: Normal. HEPATOBILIARY: Normal hepatic contours. No intra- or extrahepatic biliary dilatation. The gallbladder is normal. PANCREAS: Normal pancreas. No ductal dilatation or peripancreatic fluid collection. SPLEEN: Normal. ADRENALS/URINARY TRACT: The adrenal glands are normal. There are numerous bilateral renal cysts. The largest on the right measures 5.3 cm and the largest on the left measures 4.4 cm. No hydronephrosis. Decompressed by Foley catheter. STOMACH/BOWEL: There is no hiatal hernia. Normal duodenal course and caliber. No small bowel dilatation or inflammation. There is ulceration extending to the skin surface dorsal to the rectum. There appears to be a sinus tract extending from the rectum to the external environment. Normal appendix. VASCULAR/LYMPHATIC: There is calcific atherosclerosis of the abdominal aorta. No abdominal or pelvic lymphadenopathy. REPRODUCTIVE: Normal prostate size with symmetric seminal vesicles. MUSCULOSKELETAL. There is a right ischial decubitus ulcer. The bone surface extends to the area of ulceration. There is  multifocal lucency within the right ischial tuberosity. There is a sacral decubitus ulcer that abuts the dorsal surface of the distal sacrum. There is no definite sacral osteolysis. OTHER: None. IMPRESSION: 1. Ulceration dorsal to the distal rectum with suspected sinus tract extending to the skin surface and external environment. This has been present on multiple prior examinations, extending back to 2009, but has increased in size. 2. Right ischial decubitus ulcer with multifocal lucency within the right ischial tuberosity, consistent with osteomyelitis. 3. Sacral decubitus ulcer that abuts the dorsal surface of the distal sacrum  without definite sacral osteolysis. 4. Aortic Atherosclerosis (ICD10-I70.0). Electronically Signed   By: Ulyses Jarred M.D.   On: 05/31/2020 00:32   DG Chest Portable 1 View  Result Date: 05/30/2020 CLINICAL DATA:  Shortness of breath. EXAM: PORTABLE CHEST 1 VIEW COMPARISON:  Apr 15, 2018 FINDINGS: Mild, diffuse chronic appearing increased lung markings are seen without evidence of acute infiltrate, pleural effusion or pneumothorax. The heart size and mediastinal contours are within normal limits. Radiopaque pedicle screws are seen overlying the visualized portion of the cervical spine. A radiopaque fusion plate and screws are also seen overlying the cervical spine. The visualized skeletal structures are otherwise unremarkable. IMPRESSION: No active disease. Electronically Signed   By: Virgina Norfolk M.D.   On: 05/30/2020 22:25    Assessment/Plan Principal Problem:   Sacral osteomyelitis (HCC) Active Problems:   Hyperlipidemia   Paraplegic spinal paralysis (HCC)   Tobacco use   Cataracts, bilateral   Opioid type dependence, continuous ([place])   Hypertension   Decubitus ulcer of right ischium   S/P AKA (above knee amputation) unilateral, right (HCC)   Diabetes mellitus (HCC)   Chronic osteomyelitis involving pelvic region and thigh (HCC)   Chronic pain syndrome    Neurologic gait disorder   PARAPLEGIA   Phantom limb pain ([place])   1. Hyponatremia -secondary to dehydration treating with gentle IV fluids and supportive care.  Encourage oral intake. 2. Chronic sacral osteomyelitis decubitus ulcer of the right ischium-patient is followed closely evaluate for [facility] health with this and recently had a punch biopsy done with findings consistent of wound culture positive for staph aureus and Proteus mirabilis.  Continue IV antibiotics for now.  Patient will need close outpatient follow-up with infectious disease patient was referred already by [facility] health.  Continue local wound care.  Consult dietitian. 3. Chronic pain syndrome-resume home medications. 4. Status post right AKA-well-healed wound.  Phantom pain being addressed. 5. Type 2 diabetes mellitus-sliding scale coverage ordered.  Follow CBG. 6. Essential hypertension-stable. 7. Tobacco-nicotine patch.   DVT prophylaxis: enoxaparin  Code Status: Full   Family Communication:   Disposition Plan: inp   Consults called:   Admission status: inp   Irwin Brakeman MD [facility] How to contact the [facility] Attending or Consulting provider [place] or covering provider during after hours [place], for this patient?  1. Check the care team in [facility] and look for a) attending/consulting [facility] provider listed and b) the [facility] team listed 2. Log into [email or web address] and use [place]'s universal password to access. If you do not have the password, please contact the hospital operator. 3. Locate the [facility] provider you are looking for under [facility] and page to a number that you can be directly reached. 4. If you still have difficulty reaching the provider, please page the [facility] (Director on Call) for the Hospitalists listed on amion for assistance.   If 7PM-7AM, please contact night-coverage [email or web address] Password [facility]  05/31/2020, 3:56 PM

## 2020-05-31 NOTE — ED Notes (Signed)
Pt refused his meal at this time.

## 2020-05-31 NOTE — ED Provider Notes (Signed)
Care assumed from Dr. Laverta Baltimore.  Patient with chronic decubitus wound here with decreased p.o. intake and increased pain to the bottom.  No fever at home.  Wound is managed by [facility].  He is currently taking Bactrim.  Wife reports decreased p.o. intake with worsening erythema along the wound as well as white patches on the tongue for the past several weeks.  Has been having watery diarrhea as well.  Concern for dehydration.  CT scan is pending for evaluation for deep space abscess Hemoglobin today is similar to recent values at [facility] in the 8 range.  CT shows: IMPRESSION: 1. Ulceration dorsal to the distal rectum with suspected sinus tract extending to the skin surface and external environment. This has been present on multiple prior examinations, extending back to 2009, but has increased in size. 2. Right ischial decubitus ulcer with multifocal lucency within the right ischial tuberosity, consistent with osteomyelitis. 3. Sacral decubitus ulcer that abuts the dorsal surface of the distal sacrum without definite sacral osteolysis. 4. Aortic Atherosclerosis (ICD10-I70.0).  Discussed with [facility] transfer line.  They have no beds available currently for next 24 to 48 hours.  Patient and family are agreeable to admission at this facility.  Will hydrate overnight.  Antibiotics will be started after discussion with hospitalist Dr. Olevia Bowens.  Discussed with pharmacy.  Patient with ESBL as well as MRSA in the past.  Will give vancomycin and meropenem.  C. difficile study will be sent given his diarrhea.   Ezequiel Essex, MD 05/31/20 [phone]

## 2020-05-31 NOTE — ED Notes (Signed)
ED TO INPATIENT HANDOFF REPORT  ED Nurse Name and Phone #: [phone]  S Name/Age/Gender Shawn [name]. 69 y.o. male Room/Bed: APA10/APA10  Code Status   Code Status: Full Code  Home/SNF/Other Home Patient oriented to: self, place, time and situation Is this baseline? Yes   Triage Complete: Triage complete  Chief Complaint Sacral osteomyelitis [facility]) [M46.28]  Triage Note Pt here from home with [facility]. PT and wife concerned for wound infection on his bottom, dehydration, hiccups, and possible thrush. Pt [facility] but wife was unable to get in touch with them today.    Allergies Allergies  Allergen Reactions  . Penicillins Hives  . Statins     Uncontrolled diarrhea    Level of Care/Admitting Diagnosis ED Disposition    ED Disposition Condition [facility] Area: [facility] [[id number]]  Level of Care: Telemetry [5]  Covid Evaluation: Asymptomatic Screening Protocol (No Symptoms)  Admission Type: Emergency [1]  Diagnosis: Sacral osteomyelitis [facility]) [[id number]]  Admitting Physician: Reubin Milan [[id number]]  Attending Physician: Reubin Milan [[id number]]  Estimated length of stay: past midnight tomorrow  Certification:: I certify this patient will need inpatient services for at least 2 midnights       B Medical/Surgery History Past Medical History:  Diagnosis Date  . Allergy   . Arthritis   . C. difficile colitis 11/2008  . Decubitus ulcer 1999   excision of right ischial pressure sore with flap reconstruction done by Dr. Denese Killings 10/31/2008  . Diabetic ketosis ([place]) 05/05/2019  . GERD (gastroesophageal reflux disease)   . Hyperlipidemia   . Hypertension   . Paraplegia ([place])    secondary to [place]  . Perineal abscess   . Substance abuse ([place])    alcohol   Past Surgical History:  Procedure Laterality Date  . BONE BIOPSY  11/2007   negative for organisms  . COLONOSCOPY WITH PROPOFOL N/A 08/27/2017    Procedure: COLONOSCOPY WITH PROPOFOL;  Surgeon: Irene Shipper, MD;  Location: [facility];  Service: Endoscopy;  Laterality: N/A;  . sacral wound flap  2002   done by Dr. Stephanie Coup  . SPINE SURGERY    . THROAT SURGERY       A IV Location/Drains/Wounds Patient Lines/Drains/Airways Status    Active Line/Drains/Airways    Name Placement date Placement time Site Days   Peripheral IV 05/30/20 Right Antecubital 05/30/20  2326  Antecubital  1   Urethral Catheter Latex --  --  Latex     Suprapubic Catheter 16 Fr. 08/13/12  2210  --  2848   Pressure Injury 05/05/19 05/05/19  1710   392   Wound / Incision (Open or Dehisced) 05/07/19 Knee Right 05/07/19  0800  Knee  390          Intake/Output Last 24 hours  Intake/Output Summary (Last 24 hours) at 05/31/2020 1521 Last data filed at 05/31/2020 1138 Gross per 24 hour  Intake 97.39 ml  Output 500 ml  Net -402.61 ml    Labs/Imaging Results for orders placed or performed during the hospital encounter of 05/30/20 (from the past 48 hour(s))  Comprehensive metabolic panel     Status: Abnormal   Collection Time: 05/30/20 10:01 PM  Result Value Ref Range   Sodium 127 (L) 135 - 145 mmol/L   Potassium 4.2 3.5 - 5.1 mmol/L   Chloride 96 (L) 98 - 111 mmol/L   CO2 19 (L) 22 - 32 mmol/L  Glucose, Bld 94 70 - 99 mg/dL    Comment: Glucose reference range applies only to samples taken after fasting for at least 8 hours.   BUN 13 8 - 23 mg/dL   Creatinine, Ser 0.63 0.61 - 1.24 mg/dL   Calcium 7.8 (L) 8.9 - 10.3 mg/dL   Total Protein 6.4 (L) 6.5 - 8.1 g/dL   Albumin 1.8 (L) 3.5 - 5.0 g/dL   AST 13 (L) 15 - 41 U/L   ALT 19 0 - 44 U/L   Alkaline Phosphatase 91 38 - 126 U/L   Total Bilirubin 0.5 0.3 - 1.2 mg/dL   GFR calc non Af Amer >60 >60 mL/min   GFR calc Af Amer >60 >60 mL/min   Anion gap 12 5 - 15    Comment: Performed at [facility], [address], [place], [place] [address]  Lipase, blood     Status: None   Collection Time: 05/30/20 10:01  PM  Result Value Ref Range   Lipase 14 11 - 51 U/L    Comment: Performed at [facility], [address] [place], [place] [address]  CBC with Differential     Status: Abnormal   Collection Time: 05/30/20 10:01 PM  Result Value Ref Range   WBC 12.9 (H) 4.0 - 10.5 K/uL   RBC 4.04 (L) 4.22 - 5.81 MIL/uL   Hemoglobin 7.8 (L) 13.0 - 17.0 g/dL    Comment: Reticulocyte Hemoglobin testing may be clinically indicated, consider ordering this additional test [id number]    HCT 26.1 (L) 39 - 52 %   MCV 64.6 (L) 80.0 - 100.0 fL   MCH 19.3 (L) 26.0 - 34.0 pg   MCHC 29.9 (L) 30.0 - 36.0 g/dL   RDW 21.4 (H) 11.5 - 15.5 %   Platelets 861 (H) 150 - 400 K/uL   nRBC 0.0 0.0 - 0.2 %   Neutrophils Relative % 83 %   Neutro Abs 10.6 (H) 1.7 - 7.7 K/uL   Lymphocytes Relative 7 %   Lymphs Abs 0.9 0.7 - 4.0 K/uL   Monocytes Relative 8 %   Monocytes Absolute 1.1 (H) 0 - 1 K/uL   Eosinophils Relative 1 %   Eosinophils Absolute 0.1 0 - 0 K/uL   Basophils Relative 0 %   Basophils Absolute 0.0 0 - 0 K/uL   Immature Granulocytes 1 %   Abs Immature Granulocytes 0.18 (H) 0.00 - 0.07 K/uL    Comment: Performed at [facility], [address] [place], [place] [address]  Lactic acid, plasma     Status: None   Collection Time: 05/30/20 10:01 PM  Result Value Ref Range   Lactic Acid, Venous 1.1 0.5 - 1.9 mmol/L    Comment: Performed at [facility], [address] [place], [place] [address]  C-reactive protein     Status: Abnormal   Collection Time: 05/30/20 10:01 PM  Result Value Ref Range   CRP 19.2 (H) <1.0 mg/dL    Comment: Performed at [facility], [address], [place], [place] [address]  Protime-INR     Status: None   Collection Time: 05/30/20 10:01 PM  Result Value Ref Range   Prothrombin Time 14.5 11.4 - 15.2 seconds   INR 1.2 0.8 - 1.2    Comment: (NOTE) INR goal varies based on device and disease states. Performed at [facility], [address] [place], [place] [address]   Vitamin B12      Status: None   Collection Time: 05/30/20 10:01  PM  Result Value Ref Range   Vitamin B-12 583 180 - 914 pg/mL    Comment: (NOTE) This assay is not validated for testing neonatal or myeloproliferative syndrome specimens for Vitamin B12 levels. Performed at [facility], [address], [place], [place] [address]   Folate     Status: Abnormal   Collection Time: 05/30/20 10:01 PM  Result Value Ref Range   Folate 3.3 (L) >5.9 ng/mL    Comment: Performed at [facility], [address] [place], [place] [address]  Iron and TIBC     Status: Abnormal   Collection Time: 05/30/20 10:01 PM  Result Value Ref Range   Iron <5 (L) 45 - 182 ug/dL   TIBC 138 (L) 250 - 450 ug/dL   Saturation Ratios 3 (L) 17.9 - 39.5 %   UIBC 134 ug/dL    Comment: Performed at [facility], [address] [place], [place] [address]  Ferritin     Status: None   Collection Time: 05/30/20 10:01 PM  Result Value Ref Range   Ferritin 212 24 - 336 ng/mL    Comment: Performed at [facility], [address], [place], [place] [address]  Reticulocytes     Status: Abnormal   Collection Time: 05/30/20 10:01 PM  Result Value Ref Range   Retic Ct Pct 1.5 0.4 - 3.1 %   RBC. 4.05 (L) 4.22 - 5.81 MIL/uL   Retic Count, Absolute 59.5 19.0 - 186.0 K/uL   Immature Retic Fract 30.4 (H) 2.3 - 15.9 %    Comment: Performed at [facility], [address], [place], [place] [address]  Culture, blood (routine x 2)     Status: None (Preliminary result)   Collection Time: 05/30/20 10:10 PM   Specimen: Right Antecubital; Blood  Result Value Ref Range   Specimen Description RIGHT ANTECUBITAL    Special Requests      BOTTLES DRAWN AEROBIC AND ANAEROBIC Blood Culture adequate volume   Culture      NO GROWTH < 12 HOURS Performed at [facility], [address], [place], [place] [address]    Report Status PENDING   Culture, blood (routine x 2)     Status: None (Preliminary result)   Collection Time: 05/30/20 10:10 PM   Specimen: BLOOD RIGHT  HAND  Result Value Ref Range   Specimen Description BLOOD RIGHT HAND    Special Requests      BOTTLES DRAWN AEROBIC AND ANAEROBIC Blood Culture adequate volume   Culture      NO GROWTH < 12 HOURS Performed at [facility], [address] [place], [place] [address]    Report Status PENDING   POC occult blood, ED     Status: None   Collection Time: 05/30/20 11:03 PM  Result Value Ref Range   Fecal Occult Bld NEGATIVE NEGATIVE  Type and screen [facility]     Status: None   Collection Time: 05/30/20 11:36 PM  Result Value Ref Range   ABO/RH(D) O POS    Antibody Screen NEG    Sample Expiration      06/02/2020,2359 Performed at [facility], [address], [place], [place] [address]   Hemoglobin A1c     Status: Abnormal   Collection Time: 05/30/20 11:38 PM  Result Value Ref Range   Hgb A1c MFr Bld 6.2 (H) 4.8 - 5.6 %    Comment: (NOTE) Pre diabetes:          5.7%-6.4%  Diabetes:              >  6.4%  Glycemic control for   <7.0% adults with diabetes    Mean Plasma Glucose 131.24 mg/dL    Comment: Performed at [facility] [address] [place], [place] [address]  SARS Coronavirus 2 by RT PCR (hospital order, performed in [facility] hospital lab) Nasopharyngeal Nasopharyngeal Swab     Status: None   Collection Time: 05/31/20 12:48 AM   Specimen: Nasopharyngeal Swab  Result Value Ref Range   SARS Coronavirus 2 NEGATIVE NEGATIVE    Comment: (NOTE) SARS-CoV-2 target nucleic acids are NOT DETECTED.  The SARS-CoV-2 RNA is generally detectable in upper and lower respiratory specimens during the acute phase of infection. The lowest concentration of SARS-CoV-2 viral copies this assay can detect is 250 copies / mL. A negative result does not preclude SARS-CoV-2 infection and should not be used as the sole basis for treatment or other patient management decisions.  A negative result may occur with improper specimen collection / handling, submission of specimen  other than nasopharyngeal swab, presence of viral mutation(s) within the areas targeted by this assay, and inadequate number of viral copies (<250 copies / mL). A negative result must be combined with clinical observations, patient history, and epidemiological information.  Fact Sheet for Patients:   StrictlyIdeas.no  Fact Sheet for Healthcare Providers: BankingDealers.co.za  This test is not yet approved or  cleared by the [place] FDA and has been authorized for detection and/or diagnosis of SARS-CoV-2 by FDA under an Emergency Use Authorization (EUA).  This EUA will remain in effect (meaning this test can be used) for the duration of the COVID-19 declaration under Section 564(b)(1) of the Act, 21 U.S.C. section 360bbb-3(b)(1), unless the authorization is terminated or revoked sooner.  Performed at [facility], [address], [place], [place] [address]   Basic metabolic panel     Status: Abnormal   Collection Time: 05/31/20  7:34 AM  Result Value Ref Range   Sodium 131 (L) 135 - 145 mmol/L   Potassium 3.4 (L) 3.5 - 5.1 mmol/L    Comment: DELTA CHECK NOTED   Chloride 100 98 - 111 mmol/L   CO2 17 (L) 22 - 32 mmol/L   Glucose, Bld 80 70 - 99 mg/dL    Comment: Glucose reference range applies only to samples taken after fasting for at least 8 hours.   BUN 11 8 - 23 mg/dL   Creatinine, Ser 0.58 (L) 0.61 - 1.24 mg/dL   Calcium 7.6 (L) 8.9 - 10.3 mg/dL   GFR calc non Af Amer >60 >60 mL/min   GFR calc Af Amer >60 >60 mL/min   Anion gap 14 5 - 15    Comment: Performed at [facility], [address], [place], [place] [address]  CBC WITH DIFFERENTIAL     Status: None (Preliminary result)   Collection Time: 05/31/20  7:34 AM  Result Value Ref Range   WBC DUPLICATE REQUEST 4.0 - 56.3 K/uL   RBC DUPLICATE REQUEST 1.49 - 5.81 MIL/uL   Hemoglobin DUPLICATE REQUEST 70.2 - 63.7 g/dL   HCT DUPLICATE REQUEST 39 - 52 %   MCV DUPLICATE  REQUEST 85.8 - 850.2 fL   MCH DUPLICATE REQUEST 77.4 - 12.8 pg   MCHC DUPLICATE REQUEST 78.6 - 76.7 g/dL   RDW DUPLICATE REQUEST 20.9 - 15.5 %   Platelets DUPLICATE REQUEST 470 - 962 K/uL   nRBC DUPLICATE REQUEST 0.0 - 0.2 %   Neutrophils Relative % PENDING %   Neutro Abs PENDING 1.7 -  7.7 K/uL   Band Neutrophils PENDING %   Lymphocytes Relative PENDING %   Lymphs Abs PENDING 0.7 - 4.0 K/uL   Monocytes Relative PENDING %   Monocytes Absolute PENDING 0 - 1 K/uL   Eosinophils Relative PENDING %   Eosinophils Absolute PENDING 0 - 0 K/uL   Basophils Relative PENDING %   Basophils Absolute PENDING 0 - 0 K/uL   WBC Morphology PENDING    RBC Morphology PENDING    Smear Review PENDING    Other PENDING %   nRBC PENDING 0 /100 WBC   Metamyelocytes Relative PENDING %   Myelocytes PENDING %   Promyelocytes Relative PENDING %   Blasts PENDING %   Immature Granulocytes PENDING %   Abs Immature Granulocytes PENDING 0.00 - 0.07 K/uL  CBC with Differential/Platelet     Status: Abnormal   Collection Time: 05/31/20  7:34 AM  Result Value Ref Range   WBC 16.1 (H) 4.0 - 10.5 K/uL   RBC 3.79 (L) 4.22 - 5.81 MIL/uL   Hemoglobin 7.3 (L) 13.0 - 17.0 g/dL    Comment: Reticulocyte Hemoglobin testing may be clinically indicated, consider ordering this additional test [id number]    HCT 24.2 (L) 39 - 52 %   MCV 63.9 (L) 80.0 - 100.0 fL   MCH 19.3 (L) 26.0 - 34.0 pg   MCHC 30.2 30.0 - 36.0 g/dL   RDW 21.2 (H) 11.5 - 15.5 %   Platelets 895 (H) 150 - 400 K/uL    Comment: SPECIMEN CHECKED FOR CLOTS PLATELET COUNT CONFIRMED BY SMEAR    nRBC 0.0 0.0 - 0.2 %   Neutrophils Relative % 92 %   Neutro Abs 14.7 (H) 1.7 - 7.7 K/uL   Lymphocytes Relative 4 %   Lymphs Abs 0.6 (L) 0.7 - 4.0 K/uL   Monocytes Relative 3 %   Monocytes Absolute 0.6 0 - 1 K/uL   Eosinophils Relative 0 %   Eosinophils Absolute 0.0 0 - 0 K/uL   Basophils Relative 0 %   Basophils Absolute 0.0 0 - 0 K/uL   Immature Granulocytes 1 %    Abs Immature Granulocytes 0.22 (H) 0.00 - 0.07 K/uL   Target Cells PRESENT     Comment: Performed at [facility], [address] [place], [place] [address]  CBG monitoring, ED     Status: None   Collection Time: 05/31/20  8:49 AM  Result Value Ref Range   Glucose-Capillary 78 70 - 99 mg/dL    Comment: Glucose reference range applies only to samples taken after fasting for at least 8 hours.  CBG monitoring, ED     Status: None   Collection Time: 05/31/20 11:35 AM  Result Value Ref Range   Glucose-Capillary 89 70 - 99 mg/dL    Comment: Glucose reference range applies only to samples taken after fasting for at least 8 hours.   CT ABDOMEN PELVIS W CONTRAST  Result Date: 05/31/2020 CLINICAL DATA:  Abdominal infection. Chronic osteomyelitis. Worsening erythema. EXAM: CT ABDOMEN AND PELVIS WITH CONTRAST TECHNIQUE: Multidetector CT imaging of the abdomen and pelvis was performed using the standard protocol following bolus administration of intravenous contrast. CONTRAST:  135mL OMNIPAQUE IOHEXOL 300 MG/ML  SOLN COMPARISON:  CT abdomen pelvis 11/10/2008 FINDINGS: LOWER CHEST: Normal. HEPATOBILIARY: Normal hepatic contours. No intra- or extrahepatic biliary dilatation. The gallbladder is normal. PANCREAS: Normal pancreas. No ductal dilatation or peripancreatic fluid collection. SPLEEN: Normal. ADRENALS/URINARY TRACT: The adrenal glands are normal. There are numerous bilateral renal  cysts. The largest on the right measures 5.3 cm and the largest on the left measures 4.4 cm. No hydronephrosis. Decompressed by Foley catheter. STOMACH/BOWEL: There is no hiatal hernia. Normal duodenal course and caliber. No small bowel dilatation or inflammation. There is ulceration extending to the skin surface dorsal to the rectum. There appears to be a sinus tract extending from the rectum to the external environment. Normal appendix. VASCULAR/LYMPHATIC: There is calcific atherosclerosis of the abdominal aorta. No abdominal  or pelvic lymphadenopathy. REPRODUCTIVE: Normal prostate size with symmetric seminal vesicles. MUSCULOSKELETAL. There is a right ischial decubitus ulcer. The bone surface extends to the area of ulceration. There is multifocal lucency within the right ischial tuberosity. There is a sacral decubitus ulcer that abuts the dorsal surface of the distal sacrum. There is no definite sacral osteolysis. OTHER: None. IMPRESSION: 1. Ulceration dorsal to the distal rectum with suspected sinus tract extending to the skin surface and external environment. This has been present on multiple prior examinations, extending back to 2009, but has increased in size. 2. Right ischial decubitus ulcer with multifocal lucency within the right ischial tuberosity, consistent with osteomyelitis. 3. Sacral decubitus ulcer that abuts the dorsal surface of the distal sacrum without definite sacral osteolysis. 4. Aortic Atherosclerosis (ICD10-I70.0). Electronically Signed   By: Ulyses Jarred M.D.   On: 05/31/2020 00:32   DG Chest Portable 1 View  Result Date: 05/30/2020 CLINICAL DATA:  Shortness of breath. EXAM: PORTABLE CHEST 1 VIEW COMPARISON:  Apr 15, 2018 FINDINGS: Mild, diffuse chronic appearing increased lung markings are seen without evidence of acute infiltrate, pleural effusion or pneumothorax. The heart size and mediastinal contours are within normal limits. Radiopaque pedicle screws are seen overlying the visualized portion of the cervical spine. A radiopaque fusion plate and screws are also seen overlying the cervical spine. The visualized skeletal structures are otherwise unremarkable. IMPRESSION: No active disease. Electronically Signed   By: Virgina Norfolk M.D.   On: 05/30/2020 22:25    Pending Labs Unresulted Labs (From admission, onward) Comment          Start     Ordered   06/07/20 0500  Creatinine, serum  (enoxaparin (LOVENOX)    CrCl >/= 30 ml/min)  Weekly,   R     Comments: while on enoxaparin therapy     05/31/20 0258   06/01/20 0500  HIV Antibody (routine testing w rflx)  (HIV Antibody (Routine testing w reflex) panel)  Tomorrow morning,   R        05/31/20 0258   05/31/20 9604  Basic metabolic panel  Daily,   R      05/31/20 0258   05/31/20 0800  CBC WITH DIFFERENTIAL  Daily,   R      05/31/20 0258   05/30/20 2153  C Difficile Quick Screen w PCR reflex  (C Difficile quick screen w PCR reflex panel)  Once, for 24 hours,   STAT       References:&nbsp;&nbsp;&nbsp;&nbsp;CDiff Information Tool   05/30/20 2155          Vitals/Pain Today's Vitals   05/31/20 1434 05/31/20 1435 05/31/20 1436 05/31/20 1437  BP:      Pulse:      Resp: 20 20 16 20   Temp:      TempSrc:      SpO2:      Weight:      Height:      PainSc:        Isolation Precautions Enteric precautions (UV  disinfection)  Medications Medications  enoxaparin (LOVENOX) injection 40 mg (40 mg Subcutaneous Given 05/31/20 1100)  0.9 %  sodium chloride infusion ( Intravenous Rate/Dose Verify 05/31/20 1216)  amLODipine (NORVASC) tablet 10 mg (10 mg Oral Given 05/31/20 1104)  diclofenac Sodium (VOLTAREN) 1 % topical gel 2 g (2 g Topical Given 05/31/20 1102)  ezetimibe (ZETIA) tablet 10 mg (10 mg Oral Given 05/31/20 1105)  gabapentin (NEURONTIN) capsule 600 mg (600 mg Oral Given 05/31/20 1105)  insulin glargine (LANTUS) injection 18 Units (has no administration in time range)  pantoprazole (PROTONIX) EC tablet 40 mg (40 mg Oral Given 05/31/20 1105)  methocarbamol (ROBAXIN) tablet 500 mg (has no administration in time range)  insulin aspart (novoLOG) injection 0-15 Units (0 Units Subcutaneous Not Given 05/31/20 1140)  vancomycin (VANCOCIN) IVPB 1000 mg/200 mL premix (has no administration in time range)  meropenem (MERREM) 1 g in sodium chloride 0.9 % 100 mL IVPB (0 g Intravenous Stopped 05/31/20 1138)  sodium chloride 0.9 % bolus 1,000 mL (0 mLs Intravenous Stopped 05/31/20 0114)  nystatin (MYCOSTATIN) 100000 UNIT/ML suspension 500,000  Units (500,000 Units Oral Given 05/31/20 0012)  fluconazole (DIFLUCAN) tablet 150 mg (150 mg Oral Given 05/31/20 0012)  iohexol (OMNIPAQUE) 300 MG/ML solution 100 mL (100 mLs Intravenous Contrast Given 05/30/20 2351)  vancomycin (VANCOREADY) IVPB 1500 mg/300 mL (0 mg Intravenous Stopped 05/31/20 0327)  meropenem (MERREM) 1 g in sodium chloride 0.9 % 100 mL IVPB (0 g Intravenous Stopped 05/31/20 0327)    Mobility walks Moderate fall risk   Focused Assessments    R Recommendations: See Admitting Provider Note  Report given to:   Additional Notes:

## 2020-06-01 ENCOUNTER — Inpatient Hospital Stay: Payer: Self-pay

## 2020-06-01 DIAGNOSIS — E43 Unspecified severe protein-calorie malnutrition: Secondary | ICD-10-CM

## 2020-06-01 DIAGNOSIS — Z720 Tobacco use: Secondary | ICD-10-CM

## 2020-06-01 DIAGNOSIS — I10 Essential (primary) hypertension: Secondary | ICD-10-CM

## 2020-06-01 DIAGNOSIS — Z89611 Acquired absence of right leg above knee: Secondary | ICD-10-CM

## 2020-06-01 HISTORY — DX: Unspecified severe protein-calorie malnutrition: E43

## 2020-06-01 LAB — CBC WITH DIFFERENTIAL/PLATELET
Abs Immature Granulocytes: 0.14 10*3/uL — ABNORMAL HIGH (ref 0.00–0.07)
Basophils Absolute: 0 10*3/uL (ref 0.0–0.1)
Basophils Relative: 1 %
Eosinophils Absolute: 0.1 10*3/uL (ref 0.0–0.5)
Eosinophils Relative: 1 %
HCT: 24.9 % — ABNORMAL LOW (ref 39.0–52.0)
Hemoglobin: 7.4 g/dL — ABNORMAL LOW (ref 13.0–17.0)
Immature Granulocytes: 2 %
Lymphocytes Relative: 15 %
Lymphs Abs: 1.3 10*3/uL (ref 0.7–4.0)
MCH: 19.1 pg — ABNORMAL LOW (ref 26.0–34.0)
MCHC: 29.7 g/dL — ABNORMAL LOW (ref 30.0–36.0)
MCV: 64.3 fL — ABNORMAL LOW (ref 80.0–100.0)
Monocytes Absolute: 1 10*3/uL (ref 0.1–1.0)
Monocytes Relative: 11 %
Neutro Abs: 6.1 10*3/uL (ref 1.7–7.7)
Neutrophils Relative %: 70 %
Platelets: 841 10*3/uL — ABNORMAL HIGH (ref 150–400)
RBC: 3.87 MIL/uL — ABNORMAL LOW (ref 4.22–5.81)
RDW: 21.3 % — ABNORMAL HIGH (ref 11.5–15.5)
WBC: 8.6 10*3/uL (ref 4.0–10.5)
nRBC: 0 % (ref 0.0–0.2)

## 2020-06-01 LAB — BASIC METABOLIC PANEL
Anion gap: 11 (ref 5–15)
BUN: 12 mg/dL (ref 8–23)
CO2: 19 mmol/L — ABNORMAL LOW (ref 22–32)
Calcium: 7.4 mg/dL — ABNORMAL LOW (ref 8.9–10.3)
Chloride: 102 mmol/L (ref 98–111)
Creatinine, Ser: 0.55 mg/dL — ABNORMAL LOW (ref 0.61–1.24)
GFR calc Af Amer: >60 mL/min (ref >60)
GFR calc non Af Amer: >60 mL/min (ref >60)
Glucose, Bld: 102 mg/dL — ABNORMAL HIGH (ref 70–99)
Potassium: 3.7 mmol/L (ref 3.5–5.1)
Sodium: 132 mmol/L — ABNORMAL LOW (ref 135–145)

## 2020-06-01 LAB — MRSA PCR SCREENING: MRSA by PCR: POSITIVE — AB

## 2020-06-01 LAB — GLUCOSE, CAPILLARY
Glucose-Capillary: 126 mg/dL — ABNORMAL HIGH (ref 70–99)
Glucose-Capillary: 148 mg/dL — ABNORMAL HIGH (ref 70–99)
Glucose-Capillary: 186 mg/dL — ABNORMAL HIGH (ref 70–99)
Glucose-Capillary: 136 mg/dL — ABNORMAL HIGH (ref 70–99)
Glucose-Capillary: 157 mg/dL — ABNORMAL HIGH (ref 70–99)

## 2020-06-01 LAB — MAGNESIUM: Magnesium: 1 mg/dL — ABNORMAL LOW (ref 1.7–2.4)

## 2020-06-01 LAB — HIV ANTIBODY (ROUTINE TESTING W REFLEX): HIV Screen 4th Generation wRfx: NONREACTIVE

## 2020-06-01 MED ORDER — SODIUM CHLORIDE 0.9% FLUSH
10.0000 mL | Freq: Two times a day (BID) | INTRAVENOUS | Status: DC
  Administered 2020-06-01 – 2020-06-03 (×4): 10 mL

## 2020-06-01 MED ORDER — FLUCONAZOLE 150 MG PO TABS
150.0000 mg | ORAL_TABLET | Freq: Every day | ORAL | Status: DC
  Administered 2020-06-01 – 2020-06-03 (×4): 150 mg via ORAL
  Filled 2020-06-01 (×2): qty 1

## 2020-06-01 MED ORDER — KETOCONAZOLE 2 % EX CREA
TOPICAL_CREAM | Freq: Two times a day (BID) | CUTANEOUS | Status: DC
  Filled 2020-06-01: qty 15

## 2020-06-01 MED ORDER — MAGNESIUM SULFATE 4 GM/100ML IV SOLN
4.0000 g | Freq: Once | INTRAVENOUS | Status: AC
  Administered 2020-06-01: 4 g via INTRAVENOUS
  Filled 2020-06-01: qty 100

## 2020-06-01 MED ORDER — CHLORHEXIDINE GLUCONATE CLOTH 2 % EX PADS
6.0000 | MEDICATED_PAD | Freq: Every day | CUTANEOUS | Status: DC
  Administered 2020-06-01 – 2020-06-03 (×3): 6 via TOPICAL

## 2020-06-01 MED ORDER — CEFAZOLIN SODIUM-DEXTROSE 1-4 GM/50ML-% IV SOLN: 1.0000 g | Freq: Three times a day (TID) | INTRAVENOUS | Status: DC

## 2020-06-01 MED ORDER — SODIUM CHLORIDE 0.9% FLUSH: 10.0000 mL | INTRAVENOUS | Status: DC | PRN

## 2020-06-01 MED ORDER — ENSURE ENLIVE PO LIQD
237.0000 mL | Freq: Two times a day (BID) | ORAL | Status: DC
  Administered 2020-06-01 – 2020-06-03 (×5): 237 mL via ORAL

## 2020-06-01 MED ORDER — ADULT MULTIVITAMIN W/MINERALS CH
1.0000 | ORAL_TABLET | Freq: Every day | ORAL | Status: DC
  Administered 2020-06-02 – 2020-06-03 (×2): 1 via ORAL
  Filled 2020-06-01 (×2): qty 1

## 2020-06-01 MED ORDER — CEFAZOLIN SODIUM-DEXTROSE 2-4 GM/100ML-% IV SOLN
2.0000 g | Freq: Three times a day (TID) | INTRAVENOUS | Status: DC
  Administered 2020-06-01 – 2020-06-03 (×7): 2 g via INTRAVENOUS
  Filled 2020-06-01 (×8): qty 100

## 2020-06-01 MED ORDER — ENSURE MAX PROTEIN PO LIQD
11.0000 [oz_av] | Freq: Every day | ORAL | Status: DC
  Administered 2020-06-01 – 2020-06-03 (×3): 11 [oz_av] via ORAL

## 2020-06-01 MED ORDER — JUVEN PO PACK
1.0000 | PACK | Freq: Two times a day (BID) | ORAL | Status: DC
  Administered 2020-06-01 – 2020-06-02 (×3): 1 via ORAL
  Filled 2020-06-01 (×4): qty 1

## 2020-06-01 NOTE — TOC Initial Note (Signed)
Transition of Care (TOC) - Initial/Assessment Note   Patient Details  Name: Shawn [name]. MRN: [id number] Date of Birth: [birth year]  Transition of Care [facility]) CM/SW Contact:    Sherie Don, LCSW Phone Number: 06/01/2020, 1:08 PM  Clinical Narrative: Patient is a 69 year old male who was admitted for sacral osteomyelitis. Patient currently receives wound care from [facility]. TOC notified patient will need 6 weeks IV antibiotics at home. Patient expected to discharge on [place]. CSW called Pam with [facility]. Pam accepted referral and will set up [facility]. CSW spoke with patient's wife, Shawn [name]. CSW informed her of referral for IV antibiotics.          Expected Discharge Plan: [place] Barriers to Discharge: Continued Medical Work up  Patient Goals and CMS Choice Patient states their goals for this hospitalization and ongoing recovery are:: Discharge home with IV antibiotics CMS Medicare.gov Compare Post Acute Care list provided to:: Patient Choice offered to / list presented to : Patient  Expected Discharge Plan and Services Expected Discharge Plan: [place] In-house Referral: Clinical Social Work Discharge Planning Services: NA Post Acute Care Choice: Home Health Living arrangements for the past 2 months: [place]: RN [place] Agency: [facility]   Prior Living Arrangements/Services Living arrangements for the past 2 months: Single Family Home Lives with:: Spouse Patient language and need for interpreter reviewed:: Yes Need for Family Participation in Patient Care: No (Comment) Care giver support system in place?: Yes (comment) Criminal Activity/Legal Involvement Pertinent to Current Situation/Hospitalization: No - Comment as needed  Activities of Daily Living Home Assistive Devices/Equipment: Wheelchair ADL Screening (condition at time of admission) Patient's cognitive  ability adequate to safely complete daily activities?: Yes Is the patient deaf or have difficulty hearing?: No Does the patient have difficulty seeing, even when wearing glasses/contacts?: No Does the patient have difficulty concentrating, remembering, or making decisions?: No Patient able to express need for assistance with ADLs?: Yes Does the patient have difficulty dressing or bathing?: No Independently performs ADLs?: Yes (appropriate for developmental age) Does the patient have difficulty walking or climbing stairs?: Yes Weakness of Legs: Both Weakness of Arms/Hands: None  Permission Sought/Granted Permission sought to share information with : Facility Art therapist granted to share info w AGENCY: [facility]  Emotional Assessment Appearance:: Appears stated age Attitude/Demeanor/Rapport: Engaged Affect (typically observed): Accepting Orientation: : Oriented to Self, Oriented to Place, Oriented to  Time, Oriented to Situation Alcohol / Substance Use: Tobacco Use Psych Involvement: No (comment)  Admission diagnosis:  Dehydration [E86.0] Sacral osteomyelitis ([place]) [M46.28] Patient Active Problem List   Diagnosis Date Noted  . Protein-calorie malnutrition, severe 06/01/2020  . Sacral osteomyelitis ([place]) 05/31/2020  . PARAPLEGIA 05/09/2020  . Phantom limb pain ([place]) 05/09/2020  . S/P AKA (above knee amputation), right ([place]) 03/09/2020  . Neurologic gait disorder 03/09/2020  . Chronic pain syndrome 05/17/2019  . Ulceration of stump of above knee amputation of right lower extremity ([place]) 05/06/2019  . Chronic osteomyelitis involving pelvic region and thigh ([place]) 05/06/2019  . Diabetes mellitus ([place]) 05/05/2019  . S/P AKA (above knee amputation) unilateral, right ([place]) 03/05/2018  . Colon cancer screening 08/25/2017  . Opioid type dependence, continuous ([place]) 12/17/2016  . Cataracts, bilateral 01/30/2015  . Tobacco use 07/15/2013  . Health care  maintenance 09/29/2012  . Decubitus ulcer of right ischium 07/14/2012  . Hypertension 04/14/2012  . Hyperlipidemia 12/29/2006  .  Paraplegic spinal paralysis ([place]) 12/29/2006   PCP:  [name], MD Pharmacy:   [id number] [place], [address] AT [address] [place] [address] Phone: [phone] Fax: [place], [place] - [address] [place] [address] Phone: [phone] Fax: [phone]  Readmission Risk Interventions No flowsheet data found.

## 2020-06-01 NOTE — Progress Notes (Signed)
PROGRESS NOTE   Shawn [name].  NKN:[id number] DOB: 07/07/1951 DOA: 05/30/2020 PCP: [name], MD   Chief Complaint  Patient presents with  . Wound Infection   Brief Admission History:   69 y.o. male paraplegic with medical history significant for chronic pelvic osteomyelitis and chronic right ischial decubitus ulcer extending to the bone presents to eD complaining of mouth pain, sore throat, not tolerating orals well, worsening decubitus wounds despite being on a course of Bactrim that was started outpatient after he had a punch biopsy done and the wound culture results grew bacteria sensitive to Bactrim.  He was supposed to be on the Bactrim for total of 10 days.  He had also been referred to infectious disease.  Assessment & Plan:   Principal Problem:   Sacral osteomyelitis ([place]) Active Problems:   Hyperlipidemia   Paraplegic spinal paralysis (HCC)   Tobacco use   Cataracts, bilateral   Opioid type dependence, continuous (HCC)   Hypertension   Decubitus ulcer of right ischium   S/P AKA (above knee amputation) unilateral, right (HCC)   Diabetes mellitus (HCC)   Chronic osteomyelitis involving pelvic region and thigh (HCC)   Chronic pain syndrome   Neurologic gait disorder   PARAPLEGIA   Phantom limb pain (HCC)   Protein-calorie malnutrition, severe   1. Oral stomatitis / Thrush - improving with fluconazole therapy and will add nystatin swish and swallow.  2. Hyponatremia - improving with IV fluids, likely was from dehydration and poor oral intake.  3. Chronic sacral osteomyelitis and decubitus ulcer right ischium - Pt is followed closely by [facility] wound care center and recently had punch biopsy of wound with findings of MSSA and Proteus mirabilis.  He has been started on cefazolin IV.  Unfortunately he has failed oral treatments for this and wound has continued to worsen.  We have counseled with him and he has decided to try PICC line therapy with IV cefazolin for home  therapy.  He has an outpatient appointment with infectious disease scheduled for August.  He is familiar with PICC line therapy and has done well in the past.  His wife was also counseled about it and is agreeable.  We will try to have a PICC line placed in the next 24 hours.  Blood cultures no growth to date. 4. Chronic pain syndrome-we have resumed his home medications. 5. Status post right AKA-wound is well-healed at this time he continues to have phantom pain which is being addressed with his pain management medications. 6. Type 2 diabetes mellitus with neuropathic pain-continue SSI coverage and CBG monitoring. 7. Essential hypertension-blood pressures well controlled and stable. 8. Tobacco-patient counseled on smoking cessation and offered a nicotine patch.  DVT prophylaxis: Enoxaparin Code Status: Full code Family Communication: Wife updated telephone Disposition:   Status is: Inpatient  Remains inpatient appropriate because:IV treatments appropriate due to intensity of illness or inability to take PO   Dispo: The patient is from: Home              Anticipated d/c is to: Home              Anticipated d/c date is: 1 day              Patient currently is not medically stable to d/c.   Consultants:     Procedures:   PICC line placement pending   Antimicrobials:  Cefazolin IV 6/24>>   Subjective: Pt reports less sore throat today and he has been  eating better.    Objective: Vitals:   06/01/20 0205 06/01/20 0552 06/01/20 0554 06/01/20 1005  BP: 125/72 95/66 104/68 121/74  Pulse: 89 87 83   Resp: 20 20 20    Temp: 99 F (37.2 C) 98.8 F (37.1 C)    TempSrc: Oral Oral    SpO2: 99%  98%   Weight:      Height:        Intake/Output Summary (Last 24 hours) at 06/01/2020 1437 Last data filed at 06/01/2020 1300 Gross per 24 hour  Intake 1880 ml  Output 2200 ml  Net -320 ml   Filed Weights   05/30/20 2117 05/31/20 1706  Weight: 74.8 kg 77.2 kg    Examination:  General exam: thin, frail emaciated, chronically ill appearing paraplegic male, Appears calm and comfortable  Respiratory system: Clear to auscultation. Respiratory effort normal. Cardiovascular system: normal S1 & S2 heard.  No JVD, murmurs, rubs, gallops or clicks. No pedal edema. Gastrointestinal system: Abdomen is nondistended, soft and nontender. No organomegaly or masses felt. Normal bowel sounds heard. Central nervous system: Alert and oriented. Paraplegic.  Extremities: no gross edema. Skin: severe decubitus ulcerations large black eschar on tail, severe excoriated skin, intertrigo rash in groin area.  Psychiatry: Judgement and insight appear normal. Mood flat.    Data Reviewed: I have personally reviewed following labs and imaging studies  CBC: Recent Labs  Lab 05/30/20 2201 05/31/20 0734 06/01/20 0432  WBC 24.2* 35.3*  DUPLICATE REQUEST 8.6  NEUTROABS 10.6* 14.7*  PENDING 6.1  HGB 7.8* 7.3*  DUPLICATE REQUEST 7.4*  HCT 61.4* 43.1*  DUPLICATE REQUEST 54.0*  MCV 08.6* 76.1*  DUPLICATE REQUEST 95.0*  PLT 932* 671*  DUPLICATE REQUEST 245*    Basic Metabolic Panel: Recent Labs  Lab 05/30/20 2201 05/31/20 0734 06/01/20 0432  NA 127* 131* 132*  K 4.2 3.4* 3.7  CL 96* 100 102  CO2 19* 17* 19*  GLUCOSE 94 80 102*  BUN 13 11 12   CREATININE 0.63 0.58* 0.55*  CALCIUM 7.8* 7.6* 7.4*  MG  --   --  1.0*    GFR: Estimated Creatinine Clearance: 93.8 mL/min (A) (by C-G formula based on SCr of 0.55 mg/dL (L)).  Liver Function Tests: Recent Labs  Lab 05/30/20 2201  AST 13*  ALT 19  ALKPHOS 91  BILITOT 0.5  PROT 6.4*  ALBUMIN 1.8*    CBG: Recent Labs  Lab 05/31/20 0849 05/31/20 1135 05/31/20 2204 06/01/20 0729 06/01/20 1121  GLUCAP 78 89 111* 126* 148*    Recent Results (from the past 240 hour(s))  Culture, blood (routine x 2)     Status: None (Preliminary result)   Collection Time: 05/30/20 10:10 PM   Specimen: Right Antecubital;  Blood  Result Value Ref Range Status   Specimen Description RIGHT ANTECUBITAL  Final   Special Requests   Final    BOTTLES DRAWN AEROBIC AND ANAEROBIC Blood Culture adequate volume   Culture   Final    NO GROWTH 2 DAYS Performed at [facility], [address], [place], [place] [address]    Report Status PENDING  Incomplete  Culture, blood (routine x 2)     Status: None (Preliminary result)   Collection Time: 05/30/20 10:10 PM   Specimen: BLOOD RIGHT HAND  Result Value Ref Range Status   Specimen Description BLOOD RIGHT HAND  Final   Special Requests   Final    BOTTLES DRAWN AEROBIC AND ANAEROBIC Blood Culture adequate volume   Culture  Final    NO GROWTH 2 DAYS Performed at [facility], [address], [place], [place] [address]    Report Status PENDING  Incomplete  SARS Coronavirus 2 by RT PCR (hospital order, performed in [facility] hospital lab) Nasopharyngeal Nasopharyngeal Swab     Status: None   Collection Time: 05/31/20 12:48 AM   Specimen: Nasopharyngeal Swab  Result Value Ref Range Status   SARS Coronavirus 2 NEGATIVE NEGATIVE Final    Comment: (NOTE) SARS-CoV-2 target nucleic acids are NOT DETECTED.  The SARS-CoV-2 RNA is generally detectable in upper and lower respiratory specimens during the acute phase of infection. The lowest concentration of SARS-CoV-2 viral copies this assay can detect is 250 copies / mL. A negative result does not preclude SARS-CoV-2 infection and should not be used as the sole basis for treatment or other patient management decisions.  A negative result may occur with improper specimen collection / handling, submission of specimen other than nasopharyngeal swab, presence of viral mutation(s) within the areas targeted by this assay, and inadequate number of viral copies (<250 copies / mL). A negative result must be combined with clinical observations, patient history, and epidemiological information.  Fact Sheet for Patients:    StrictlyIdeas.no  Fact Sheet for Healthcare Providers: BankingDealers.co.za  This test is not yet approved or  cleared by the [place] FDA and has been authorized for detection and/or diagnosis of SARS-CoV-2 by FDA under an Emergency Use Authorization (EUA).  This EUA will remain in effect (meaning this test can be used) for the duration of the COVID-19 declaration under Section 564(b)(1) of the Act, 21 U.S.C. section 360bbb-3(b)(1), unless the authorization is terminated or revoked sooner.  Performed at [facility], [address] [place], [place] [address]      Radiology Studies: CT ABDOMEN PELVIS W CONTRAST  Result Date: 05/31/2020 CLINICAL DATA:  Abdominal infection. Chronic osteomyelitis. Worsening erythema. EXAM: CT ABDOMEN AND PELVIS WITH CONTRAST TECHNIQUE: Multidetector CT imaging of the abdomen and pelvis was performed using the standard protocol following bolus administration of intravenous contrast. CONTRAST:  133mL OMNIPAQUE IOHEXOL 300 MG/ML  SOLN COMPARISON:  CT abdomen pelvis 11/10/2008 FINDINGS: LOWER CHEST: Normal. HEPATOBILIARY: Normal hepatic contours. No intra- or extrahepatic biliary dilatation. The gallbladder is normal. PANCREAS: Normal pancreas. No ductal dilatation or peripancreatic fluid collection. SPLEEN: Normal. ADRENALS/URINARY TRACT: The adrenal glands are normal. There are numerous bilateral renal cysts. The largest on the right measures 5.3 cm and the largest on the left measures 4.4 cm. No hydronephrosis. Decompressed by Foley catheter. STOMACH/BOWEL: There is no hiatal hernia. Normal duodenal course and caliber. No small bowel dilatation or inflammation. There is ulceration extending to the skin surface dorsal to the rectum. There appears to be a sinus tract extending from the rectum to the external environment. Normal appendix. VASCULAR/LYMPHATIC: There is calcific atherosclerosis of the abdominal aorta. No  abdominal or pelvic lymphadenopathy. REPRODUCTIVE: Normal prostate size with symmetric seminal vesicles. MUSCULOSKELETAL. There is a right ischial decubitus ulcer. The bone surface extends to the area of ulceration. There is multifocal lucency within the right ischial tuberosity. There is a sacral decubitus ulcer that abuts the dorsal surface of the distal sacrum. There is no definite sacral osteolysis. OTHER: None. IMPRESSION: 1. Ulceration dorsal to the distal rectum with suspected sinus tract extending to the skin surface and external environment. This has been present on multiple prior examinations, extending back to 2009, but has increased in size. 2. Right ischial decubitus ulcer with multifocal lucency within the  right ischial tuberosity, consistent with osteomyelitis. 3. Sacral decubitus ulcer that abuts the dorsal surface of the distal sacrum without definite sacral osteolysis. 4. Aortic Atherosclerosis (ICD10-I70.0). Electronically Signed   By: Ulyses Jarred M.D.   On: 05/31/2020 00:32   DG Chest Portable 1 View  Result Date: 05/30/2020 CLINICAL DATA:  Shortness of breath. EXAM: PORTABLE CHEST 1 VIEW COMPARISON:  Apr 15, 2018 FINDINGS: Mild, diffuse chronic appearing increased lung markings are seen without evidence of acute infiltrate, pleural effusion or pneumothorax. The heart size and mediastinal contours are within normal limits. Radiopaque pedicle screws are seen overlying the visualized portion of the cervical spine. A radiopaque fusion plate and screws are also seen overlying the cervical spine. The visualized skeletal structures are otherwise unremarkable. IMPRESSION: No active disease. Electronically Signed   By: Virgina Norfolk M.D.   On: 05/30/2020 22:25   [place] EKG SITE RITE  Result Date: 06/01/2020 If Site Rite image not attached, placement could not be confirmed due to current cardiac rhythm.  Scheduled Meds: . amLODipine  10 mg Oral Daily  . Chlorhexidine Gluconate Cloth  6 each  Topical Daily  . enoxaparin (LOVENOX) injection  40 mg Subcutaneous Q24H  . ezetimibe  10 mg Oral Daily  . feeding supplement (ENSURE ENLIVE)  237 mL Oral BID BM  . fluconazole  150 mg Oral Daily  . gabapentin  600 mg Oral BID  . insulin aspart  0-15 Units Subcutaneous TID WC  . insulin glargine  18 Units Subcutaneous QHS  . ketoconazole   Topical BID  . multivitamin with minerals  1 tablet Oral Daily  . nutrition supplement (JUVEN)  1 packet Oral BID BM  . pantoprazole  40 mg Oral Daily  . Ensure Max Protein  11 oz Oral Daily  . saccharomyces boulardii  250 mg Oral BID   Continuous Infusions: . sodium chloride 70 mL/hr at 05/31/20 1858  .  ceFAZolin (ANCEF) IV       LOS: 1 day   Time spent: 27 mins    Marion Seese Wynetta Emery, MD How to contact the [facility] Attending or Consulting provider [place] or covering provider during after hours [place], for this patient?  1. Check the care team in [facility] and look for a) attending/consulting TRH provider listed and b) the [facility] team listed 2. Log into [email or web address] and use [place]'s universal password to access. If you do not have the password, please contact the hospital operator. 3. Locate the [facility] provider you are looking for under [facility] and page to a number that you can be directly reached. 4. If you still have difficulty reaching the provider, please page the [facility] (Director on Call) for the Hospitalists listed on amion for assistance.  06/01/2020, 2:37 PM

## 2020-06-01 NOTE — Progress Notes (Signed)
1530: Groin areas with denuded skin cleaned with soap and water and dressed with Rx cream Nizoral and covered with vaseline guaze and abd pads and secured with tape. These areas while still very red had much less drainage and very little bleeding today. Pt received IV pain med prior to dressing change and tolerated well.  1730: Buttocks wounds cleaned with soap and water. Continues with large area of eschar R buttocks and smaller area sacral area. All other areas remain red and denuded. All areas covered with vaseline guaze and abd pads, then secured with tape. Pt tolerated well.

## 2020-06-01 NOTE — Progress Notes (Signed)
Initial Nutrition Assessment  DOCUMENTATION CODES:   Severe malnutrition in context of acute illness/injury  INTERVENTION:  Ensure Enlive po daily, each supplement provides 350 kcal and 20 grams of protein (chocolate) Ensure Max po daily, each supplement provides 150 kcal and 30 grams of protein Magic cup BID with meals, each supplement provides 290 kcal and 9 grams of protein (chocolate) 1 packet Juven BID, each packet provides 95 calories, 2.5 grams of protein (collagen), and 9.8 grams of carbohydrate (3 grams sugar); also contains 7 grams of L-arginine and L-glutamine, 300 mg vitamin C, 15 mg vitamin E, 1.2 mcg vitamin B-12, 9.5 mg zinc, 200 mg calcium, and 1.5 g  Calcium Beta-hydroxy-Beta-methylbutyrate to support wound healing MVI with minerals daily Recommend liberalizing diet to regular  Monitor magnesium, potassium, and phosphorus daily for at least 3 days, MD to replete as needed, as pt is at risk for refeeding syndrome given pt reported 2-3 week history of poor po intake, hypomagnesemia per labs.   NUTRITION DIAGNOSIS:   Severe Malnutrition related to acute illness (sacral osteomyelitis) as evidenced by per patient/family report, energy intake < 75% for > 7 days, moderate muscle depletion, moderate fat depletion, severe fat depletion, percent weight loss.    GOAL:   Patient will meet greater than or equal to 90% of their needs    MONITOR:   PO intake, Weight trends, Labs, Skin, Supplement acceptance, Diet advancement  REASON FOR ASSESSMENT:   Consult Assessment of nutrition requirement/status, Wound healing  ASSESSMENT:  69 year old male with past medical history of HLD, paraplegic spinal paralysis, opioid dependence, HTN, decubitus ulcer of right ischium s/p right AKA, DM2, chronic pelvic osteomyelitis presented with complaints of mouth pain, sore throat, poor toleration of oral intake, weight loss, and worsening decubitus wounds s/p course of Bactrim admitted for  sacral osteomyelitis  Met with patient at bedside this morning, he reports improving appetite and intake, recalls toast, eggs, juice and coffee this morning, observed breakfast tray sitting on counter with 90% of meal eaten. Patient reports usually eating 3 meals/day, recalls eggs, sausage, and coffee for breakfast, ham or [place] sandwiches on rye for lunch, pork and beans, lima beans, salads, chicken, fish, and pork chops for dinner as well as 3-4 high protein Ensure daily. He endorses very poor intake over the past 2-3 weeks due to sore throat and mouth sores, recalls only drinking water and milk, states that he was on antibiotics and limited po intake of supplements secondary to increased diarrhea. Mouth sores have resolved, reports mild throat irritation when swallowing. He has ordered a hamburger, greens, salad, and a baked potato for lunch, stated he wanted french fries but unable to order them due to diet order. Patient is on a heart healthy diet which restricts protein and limits menu options, recommend liberalizing to regular to encourage po intake. Patient agreeable to chocolate Ensure and Magic Cup supplements to aid with meeting needs as well as Juven to support wound healing.  Mild-pitting sacral edema per RN assessment Current wt 169.84 lbs Patient endorses 10-15 lb wt loss over the past few weeks and recalls usually weighing 180-185 lbs. Per chart wt has trended down 14.74 lb (8%) in 3 weeks which is significant.   Medications reviewed and include: Diflucan, Gabapentin, SSI, Lantus 18 units at bedtime, Protonix, Florastor IVPB: Mg sulfate, Merrem, Vancomycin Labs: CBGs 126,111,89 Na 132 (L), Mg 1.0 (L)  NUTRITION - FOCUSED PHYSICAL EXAM:    Most Recent Value  Orbital Region Moderate depletion  Upper Arm Region Moderate depletion  Thoracic and Lumbar Region Unable to assess  Buccal Region Severe depletion  Temple Region Moderate depletion  Clavicle Bone Region Moderate depletion    Clavicle and Acromion Bone Region Unable to assess  Scapular Bone Region Unable to assess  Dorsal Hand Moderate depletion  Patellar Region Unable to assess  [R AKA]  Anterior Thigh Region Unable to assess  Posterior Calf Region Unable to assess  Hair Reviewed  Eyes Reviewed  Mouth Reviewed  Skin Reviewed  Nails Reviewed       Diet Order:   Diet Order            Diet Heart Room service appropriate? Yes; Fluid consistency: Thin  Diet effective now                 EDUCATION NEEDS:   Education needs have been addressed  Skin:  Skin Assessment: Skin Integrity Issues: Skin Integrity Issues:: Other (Comment) Other: MASD;buttocks;groing; ITD;groin,sacrum, perineum, scrotum; Pressure wound; back with moderate purulent;serosanguineous drainage  Last BM:  6/23  Height:   Ht Readings from Last 1 Encounters:  05/31/20 6' 0.5" (1.842 m)    Weight:   Wt Readings from Last 1 Encounters:  05/31/20 77.2 kg    Ideal Body Weight:  69.5 kg (Adjusted IBW for parapegia; R AKA)  BMI:  Body mass index is 22.77 kg/m.  Estimated Nutritional Needs:   Kcal:  6222-9798  Protein:  120-135  Fluid:  >/= 2L/day  Lajuan Lines, RD, LDN Clinical Nutrition After Hours/Weekend Pager # in [place]

## 2020-06-01 NOTE — Progress Notes (Signed)
Pt MRSA PCR: positive. On-call clinical made aware via [place].

## 2020-06-01 NOTE — Progress Notes (Signed)
Peripherally Inserted Central Catheter Placement  The IV Nurse has discussed with the patient and/or persons authorized to consent for the patient, the purpose of this procedure and the potential benefits and risks involved with this procedure.  The benefits include less needle sticks, lab draws from the catheter, and the patient may be discharged home with the catheter. Risks include, but not limited to, infection, bleeding, blood clot (thrombus formation), and puncture of an artery; nerve damage and irregular heartbeat and possibility to perform a PICC exchange if needed/ordered by physician.  Alternatives to this procedure were also discussed.  Bard Power PICC patient education guide, fact sheet on infection prevention and patient information card has been provided to patient /or left at bedside.    PICC Placement Documentation  PICC Single Lumen 06/01/20 PICC Right Brachial 42 cm 0 cm (Active)  Indication for Insertion or Continuance of Line Home intravenous therapies (PICC only) 06/01/20 1446  Exposed Catheter (cm) 0 cm 06/01/20 1446  Site Assessment Clean;Dry;Intact 06/01/20 1446  Line Status Blood return noted;Flushed;Saline locked 06/01/20 1446  Dressing Type Transparent 06/01/20 1446  Dressing Status Clean;Dry;Intact;Antimicrobial disc in place 06/01/20 1446  Safety Lock Not Applicable 16/10/96 0454  Line Care Connections checked and tightened 06/01/20 1446  Dressing Change Due 06/08/20 06/01/20 [place], Anneka Studer M 06/01/2020, 2:48 PM

## 2020-06-02 DIAGNOSIS — Z794 Long term (current) use of insulin: Secondary | ICD-10-CM | POA: Diagnosis not present

## 2020-06-02 DIAGNOSIS — E785 Hyperlipidemia, unspecified: Secondary | ICD-10-CM | POA: Diagnosis not present

## 2020-06-02 DIAGNOSIS — Z993 Dependence on wheelchair: Secondary | ICD-10-CM | POA: Diagnosis not present

## 2020-06-02 DIAGNOSIS — Z9350 Unspecified cystostomy status: Secondary | ICD-10-CM | POA: Diagnosis not present

## 2020-06-02 DIAGNOSIS — L98419 Non-pressure chronic ulcer of buttock with unspecified severity: Secondary | ICD-10-CM | POA: Diagnosis not present

## 2020-06-02 DIAGNOSIS — L89314 Pressure ulcer of right buttock, stage 4: Secondary | ICD-10-CM | POA: Diagnosis not present

## 2020-06-02 DIAGNOSIS — E1169 Type 2 diabetes mellitus with other specified complication: Secondary | ICD-10-CM | POA: Diagnosis not present

## 2020-06-02 DIAGNOSIS — Z89611 Acquired absence of right leg above knee: Secondary | ICD-10-CM | POA: Diagnosis not present

## 2020-06-02 DIAGNOSIS — M8638 Chronic multifocal osteomyelitis, other site: Secondary | ICD-10-CM | POA: Diagnosis not present

## 2020-06-02 DIAGNOSIS — Z9181 History of falling: Secondary | ICD-10-CM | POA: Diagnosis not present

## 2020-06-02 DIAGNOSIS — I10 Essential (primary) hypertension: Secondary | ICD-10-CM | POA: Diagnosis not present

## 2020-06-02 DIAGNOSIS — L989 Disorder of the skin and subcutaneous tissue, unspecified: Secondary | ICD-10-CM | POA: Diagnosis not present

## 2020-06-02 DIAGNOSIS — G8220 Paraplegia, unspecified: Secondary | ICD-10-CM | POA: Diagnosis not present

## 2020-06-02 LAB — CBC WITH DIFFERENTIAL/PLATELET
Abs Immature Granulocytes: 0.08 10*3/uL — ABNORMAL HIGH (ref 0.00–0.07)
Basophils Absolute: 0 10*3/uL (ref 0.0–0.1)
Basophils Relative: 0 %
Eosinophils Absolute: 0.1 10*3/uL (ref 0.0–0.5)
Eosinophils Relative: 2 %
HCT: 21.2 % — ABNORMAL LOW (ref 39.0–52.0)
Hemoglobin: 6.5 g/dL — CL (ref 13.0–17.0)
Immature Granulocytes: 1 %
Lymphocytes Relative: 21 %
Lymphs Abs: 1.5 10*3/uL (ref 0.7–4.0)
MCH: 19.8 pg — ABNORMAL LOW (ref 26.0–34.0)
MCHC: 30.7 g/dL (ref 30.0–36.0)
MCV: 64.4 fL — ABNORMAL LOW (ref 80.0–100.0)
Monocytes Absolute: 1.1 10*3/uL — ABNORMAL HIGH (ref 0.1–1.0)
Monocytes Relative: 15 %
Neutro Abs: 4.4 10*3/uL (ref 1.7–7.7)
Neutrophils Relative %: 61 %
Platelets: 777 10*3/uL — ABNORMAL HIGH (ref 150–400)
RBC: 3.29 MIL/uL — ABNORMAL LOW (ref 4.22–5.81)
RDW: 21.2 % — ABNORMAL HIGH (ref 11.5–15.5)
WBC: 7.3 10*3/uL (ref 4.0–10.5)
nRBC: 0 % (ref 0.0–0.2)

## 2020-06-02 LAB — BASIC METABOLIC PANEL
Anion gap: 9 (ref 5–15)
BUN: 12 mg/dL (ref 8–23)
CO2: 20 mmol/L — ABNORMAL LOW (ref 22–32)
Calcium: 7.1 mg/dL — ABNORMAL LOW (ref 8.9–10.3)
Chloride: 102 mmol/L (ref 98–111)
Creatinine, Ser: 0.47 mg/dL — ABNORMAL LOW (ref 0.61–1.24)
GFR calc Af Amer: >60 mL/min (ref >60)
GFR calc non Af Amer: >60 mL/min (ref >60)
Glucose, Bld: 89 mg/dL (ref 70–99)
Potassium: 3.7 mmol/L (ref 3.5–5.1)
Sodium: 131 mmol/L — ABNORMAL LOW (ref 135–145)

## 2020-06-02 LAB — GLUCOSE, CAPILLARY
Glucose-Capillary: 84 mg/dL (ref 70–99)
Glucose-Capillary: 125 mg/dL — ABNORMAL HIGH (ref 70–99)
Glucose-Capillary: 214 mg/dL — ABNORMAL HIGH (ref 70–99)
Glucose-Capillary: 117 mg/dL — ABNORMAL HIGH (ref 70–99)

## 2020-06-02 LAB — ABO/RH: ABO/RH(D): O POS

## 2020-06-02 LAB — PREPARE RBC (CROSSMATCH)

## 2020-06-02 LAB — MAGNESIUM: Magnesium: 1.9 mg/dL (ref 1.7–2.4)

## 2020-06-02 MED ORDER — CEFAZOLIN IV (FOR PTA / DISCHARGE USE ONLY): 2.0000 g | Freq: Three times a day (TID) | INTRAVENOUS | 0 refills | Status: AC

## 2020-06-02 MED ORDER — ACETAMINOPHEN 325 MG PO TABS
650.0000 mg | ORAL_TABLET | Freq: Four times a day (QID) | ORAL | Status: DC | PRN
  Administered 2020-06-02: 650 mg via ORAL
  Filled 2020-06-02: qty 2

## 2020-06-02 MED ORDER — ACETAMINOPHEN 325 MG PO TABS
650.0000 mg | ORAL_TABLET | Freq: Once | ORAL | Status: AC
  Administered 2020-06-02: 650 mg via ORAL
  Filled 2020-06-02: qty 2

## 2020-06-02 MED ORDER — SODIUM CHLORIDE 0.9% IV SOLUTION: Freq: Once | INTRAVENOUS | Status: AC

## 2020-06-02 NOTE — Care Management Important Message (Signed)
Important Message  Patient Details  Name: Shawn [name]. MRN: [id number] Date of Birth: 09/27/1951   Medicare Important Message Given:  Yes Beverlee Nims, RN will deliver due to contact precautions)     Tommy Medal 06/02/2020, 3:33 PM

## 2020-06-02 NOTE — TOC Progression Note (Signed)
Transition of Care (TOC) - Progression Note    Patient Details  Name: Shawn [name]. MRN: [id number] Date of Birth: 06/22/1951  Transition of Care [facility]) CM/SW Contact  Salome Arnt, [place] Phone Number: 06/02/2020, 1:40 PM  Clinical Narrative:   LCSW confirmed with Pam with [facility], pt is set up for 6 weeks IV antibiotics. Anticipate d/c tomorrow. Pt will have HHRN through [facility]. Per Pam, d/c after 2:00 antibiotic dose.     Expected Discharge Plan: [place] Barriers to Discharge: Continued Medical Work up  Expected Discharge Plan and Services Expected Discharge Plan: [place] In-house Referral: Clinical Social Work Discharge Planning Services: NA Post Acute Care Choice: [place] arrangements for the past 2 months: [place]: RN [place] Agency: [place] ([facility])         Social Determinants of Health (SDOH) Interventions    Readmission Risk Interventions No flowsheet data found.

## 2020-06-02 NOTE — Progress Notes (Signed)
PHARMACY CONSULT NOTE FOR:  OUTPATIENT  PARENTERAL ANTIBIOTIC THERAPY (OPAT)  Indication: Osteomyelitis Regimen: Cefazolin 2000 mg IV every 8 hours. End date: 07/12/2020  IV antibiotic discharge orders are pended. To discharging provider:  please sign these orders via discharge navigator,  Select New Orders & click on the button choice - Manage This Unsigned Work.     Thank you for allowing pharmacy to be a part of this patient's care.  Ramond Craver 06/02/2020, 10:36 AM

## 2020-06-02 NOTE — Progress Notes (Signed)
PROGRESS NOTE   Shawn [name].  NTZ:[id number] DOB: Apr 08, 1951 DOA: 05/30/2020 PCP: [name], MD   Chief Complaint  Patient presents with  . Wound Infection   Brief Admission History:   69 y.o. male paraplegic with medical history significant for chronic pelvic osteomyelitis and chronic right ischial decubitus ulcer extending to the bone presents to eD complaining of mouth pain, sore throat, not tolerating orals well, worsening decubitus wounds despite being on a course of Bactrim that was started outpatient after he had a punch biopsy done and the wound culture results grew bacteria sensitive to Bactrim.  He was supposed to be on the Bactrim for total of 10 days.  He had also been referred to infectious disease.  Assessment & Plan:   Principal Problem:   Sacral osteomyelitis ([place]) Active Problems:   Hyperlipidemia   Paraplegic spinal paralysis (HCC)   Tobacco use   Cataracts, bilateral   Opioid type dependence, continuous (HCC)   Hypertension   Decubitus ulcer of right ischium   S/P AKA (above knee amputation) unilateral, right (HCC)   Diabetes mellitus (HCC)   Chronic osteomyelitis involving pelvic region and thigh (HCC)   Chronic pain syndrome   Neurologic gait disorder   PARAPLEGIA   Phantom limb pain (HCC)   Protein-calorie malnutrition, severe  1. Yeast stomatitis / Thrush - improving with fluconazole therapy.  2. Hyponatremia - improved with IV fluids, likely was from dehydration and poor oral intake. He is eating and drinking much better now.  3. Chronic sacral osteomyelitis and decubitus ulcer right ischium - Pt is followed closely by [facility] wound care center and recently had punch biopsy of wound with findings of MSSA and Proteus mirabilis.  He has been started on cefazolin IV.  Unfortunately he has failed oral treatments for this and wound has continued to worsen.  We have counseled with him and he has decided to try PICC line therapy with IV cefazolin for home  therapy.  He has an outpatient appointment with infectious disease scheduled for August.  He is familiar with PICC line therapy and has done well in the past.  His wife was also counseled about it and is agreeable.  PICC line placed.  Blood cultures no growth to date.  Plan to DC home tomorrow with close outpatient follow up.  4. Chronic pain syndrome-we have resumed his home medications. 5. Anemia of chronic disease and acute blood loss anemia - Likely from dressing changes from wound.  Transfuse 2 unit PRBC.  Recheck CBC in AM.   6. Status post right AKA-wound is well-healed at this time he continues to have phantom pain which is being addressed with his pain management medications.  7. Type 2 diabetes mellitus with neuropathic pain-continue SSI coverage and CBG monitoring. 8. Essential hypertension-blood pressures well controlled and stable. 9. Tobacco-patient counseled on smoking cessation and offered a nicotine patch.  DVT prophylaxis: Enoxaparin Code Status: Full code Family Communication: Wife updated telephone Disposition:   Status is: Inpatient  Remains inpatient appropriate because:IV treatments appropriate due to intensity of illness or inability to take PO.  Plan home tomorrow for home IV antibiotics and home health RN.     Dispo: The patient is from: Home              Anticipated d/c is to: Home              Anticipated d/c date is: 1 day  Patient currently is not medically stable to d/c. Consultants:     Procedures:   PICC line placement 06/01/20  Antimicrobials:  Cefazolin IV 6/24>>   Subjective: Pt eating much better today.  Sore throat is better.     Objective: Vitals:   06/02/20 0906 06/02/20 1200 06/02/20 1226 06/02/20 1245  BP: (!) 86/57 124/78  101/62  Pulse:  (!) 107  99  Resp:  18  17  Temp: 99.1 F (37.3 C) 99.1 F (37.3 C) 99.1 F (37.3 C) 99.2 F (37.3 C)  TempSrc: Oral Oral Oral Oral  SpO2:  98%  100%  Weight:      Height:         Intake/Output Summary (Last 24 hours) at 06/02/2020 1516 Last data filed at 06/02/2020 1226 Gross per 24 hour  Intake 2242.22 ml  Output 1300 ml  Net 942.22 ml   Filed Weights   05/30/20 2117 05/31/20 1706  Weight: 74.8 kg 77.2 kg   Examination:  General exam: thin, frail emaciated, chronically ill appearing paraplegic male, Appears calm and comfortable  Respiratory system: Clear to auscultation. Respiratory effort normal. Cardiovascular system: normal S1 & S2 heard.  No JVD, murmurs, rubs, gallops or clicks. No pedal edema. Gastrointestinal system: Abdomen is nondistended, soft and nontender. No organomegaly or masses felt. Normal bowel sounds heard. Central nervous system: Alert and oriented. Paraplegic.  Extremities: no gross edema. Skin: severe decubitus ulcerations large black eschar on tail, severe excoriated skin, intertrigo rash in groin area.  Psychiatry: Judgement and insight appear normal. Mood flat.    Data Reviewed: I have personally reviewed following labs and imaging studies  CBC: Recent Labs  Lab 05/30/20 2201 05/31/20 0734 06/01/20 0432 06/02/20 0446  WBC 67.3* 41.9*  DUPLICATE REQUEST 8.6 7.3  NEUTROABS 10.6* 14.7*  PENDING 6.1 4.4  HGB 7.8* 7.3*  DUPLICATE REQUEST 7.4* 6.5*  HCT 37.9* 02.4*  DUPLICATE REQUEST 09.7* 21.2*  MCV 35.3* 29.9*  DUPLICATE REQUEST 24.2* 64.4*  PLT 683* 419*  DUPLICATE REQUEST 622* 297*    Basic Metabolic Panel: Recent Labs  Lab 05/30/20 2201 05/31/20 0734 06/01/20 0432 06/02/20 0446  NA 127* 131* 132* 131*  K 4.2 3.4* 3.7 3.7  CL 96* 100 102 102  CO2 19* 17* 19* 20*  GLUCOSE 94 80 102* 89  BUN 13 11 12 12   CREATININE 0.63 0.58* 0.55* 0.47*  CALCIUM 7.8* 7.6* 7.4* 7.1*  MG  --   --  1.0* 1.9    GFR: Estimated Creatinine Clearance: 93.8 mL/min (A) (by C-G formula based on SCr of 0.47 mg/dL (L)).  Liver Function Tests: Recent Labs  Lab 05/30/20 2201  AST 13*  ALT 19  ALKPHOS 91  BILITOT 0.5  PROT  6.4*  ALBUMIN 1.8*    CBG: Recent Labs  Lab 06/01/20 1608 06/01/20 2040 06/01/20 2057 06/02/20 0730 06/02/20 1141  GLUCAP 186* 136* 157* 84 125*    Recent Results (from the past 240 hour(s))  Culture, blood (routine x 2)     Status: None (Preliminary result)   Collection Time: 05/30/20 10:10 PM   Specimen: Right Antecubital; Blood  Result Value Ref Range Status   Specimen Description RIGHT ANTECUBITAL  Final   Special Requests   Final    BOTTLES DRAWN AEROBIC AND ANAEROBIC Blood Culture adequate volume   Culture   Final    NO GROWTH 3 DAYS Performed at [facility], [address], [place], [place] [address]    Report Status PENDING  Incomplete  Culture, blood (routine x 2)     Status: None (Preliminary result)   Collection Time: 05/30/20 10:10 PM   Specimen: BLOOD RIGHT HAND  Result Value Ref Range Status   Specimen Description BLOOD RIGHT HAND  Final   Special Requests   Final    BOTTLES DRAWN AEROBIC AND ANAEROBIC Blood Culture adequate volume   Culture   Final    NO GROWTH 3 DAYS Performed at [facility], [address], [place], [place] [address]    Report Status PENDING  Incomplete  SARS Coronavirus 2 by RT PCR (hospital order, performed in [place] hospital lab) Nasopharyngeal Nasopharyngeal Swab     Status: None   Collection Time: 05/31/20 12:48 AM   Specimen: Nasopharyngeal Swab  Result Value Ref Range Status   SARS Coronavirus 2 NEGATIVE NEGATIVE Final    Comment: (NOTE) SARS-CoV-2 target nucleic acids are NOT DETECTED.  The SARS-CoV-2 RNA is generally detectable in upper and lower respiratory specimens during the acute phase of infection. The lowest concentration of SARS-CoV-2 viral copies this assay can detect is 250 copies / mL. A negative result does not preclude SARS-CoV-2 infection and should not be used as the sole basis for treatment or other patient management decisions.  A negative result may occur with improper specimen collection /  handling, submission of specimen other than nasopharyngeal swab, presence of viral mutation(s) within the areas targeted by this assay, and inadequate number of viral copies (<250 copies / mL). A negative result must be combined with clinical observations, patient history, and epidemiological information.  Fact Sheet for Patients:   StrictlyIdeas.no  Fact Sheet for Healthcare Providers: BankingDealers.co.za  This test is not yet approved or  cleared by the [place] FDA and has been authorized for detection and/or diagnosis of SARS-CoV-2 by FDA under an Emergency Use Authorization (EUA).  This EUA will remain in effect (meaning this test can be used) for the duration of the COVID-19 declaration under Section 564(b)(1) of the Act, 21 U.S.C. section 360bbb-3(b)(1), unless the authorization is terminated or revoked sooner.  Performed at [facility], [address], [place], [place] [address]   MRSA PCR Screening     Status: Abnormal   Collection Time: 06/01/20  2:23 PM   Specimen: Nasal Mucosa; Nasopharyngeal  Result Value Ref Range Status   MRSA by PCR POSITIVE (A) NEGATIVE Final    Comment:        The GeneXpert MRSA Assay (FDA approved for NASAL specimens only), is one component of a comprehensive MRSA colonization surveillance program. It is not intended to diagnose MRSA infection nor to guide or monitor treatment for MRSA infections. RESULT CALLED TO, READ BACK BY AND VERIFIED WITH: HAISTON,T AT 2211 ON 6.24.21 BY ISLEY,B Performed at [facility], [address] [place], [place] [address]      Radiology Studies: [place] EKG SITE RITE  Result Date: 06/01/2020 If Site Rite image not attached, placement could not be confirmed due to current cardiac rhythm.  Scheduled Meds: . amLODipine  10 mg Oral Daily  . Chlorhexidine Gluconate Cloth  6 each Topical Daily  . enoxaparin (LOVENOX) injection  40 mg Subcutaneous Q24H  .  ezetimibe  10 mg Oral Daily  . feeding supplement (ENSURE ENLIVE)  237 mL Oral BID BM  . fluconazole  150 mg Oral Daily  . gabapentin  600 mg Oral BID  . insulin aspart  0-15 Units Subcutaneous TID WC  . insulin glargine  18 Units Subcutaneous QHS  .  ketoconazole   Topical BID  . multivitamin with minerals  1 tablet Oral Daily  . nutrition supplement (JUVEN)  1 packet Oral BID BM  . pantoprazole  40 mg Oral Daily  . Ensure Max Protein  11 oz Oral Daily  . saccharomyces boulardii  250 mg Oral BID  . sodium chloride flush  10-40 mL Intracatheter Q12H   Continuous Infusions: . sodium chloride 70 mL/hr at 06/01/20 1826  .  ceFAZolin (ANCEF) IV 2 g (06/02/20 0552)    LOS: 2 days   Time spent: 21 mins   Kehinde Totzke Wynetta Emery, MD How to contact the [facility] Attending or Consulting provider [place] or covering provider during after hours [place], for this patient?  1. Check the care team in [facility] and look for a) attending/consulting TRH provider listed and b) the [facility] team listed 2. Log into [email or web address] and use [place]'s universal password to access. If you do not have the password, please contact the hospital operator. 3. Locate the [facility] provider you are looking for under [facility] and page to a number that you can be directly reached. 4. If you still have difficulty reaching the provider, please page the [facility] (Director on Call) for the Hospitalists listed on amion for assistance.  06/02/2020, 3:16 PM

## 2020-06-03 DIAGNOSIS — G546 Phantom limb syndrome with pain: Secondary | ICD-10-CM

## 2020-06-03 LAB — TYPE AND SCREEN
ABO/RH(D): O POS
Antibody Screen: NEGATIVE
Unit division: 0
Unit division: 0

## 2020-06-03 LAB — CBC WITH DIFFERENTIAL/PLATELET
Abs Immature Granulocytes: 0.09 10*3/uL — ABNORMAL HIGH (ref 0.00–0.07)
Basophils Absolute: 0.1 10*3/uL (ref 0.0–0.1)
Basophils Relative: 1 %
Eosinophils Absolute: 0.2 10*3/uL (ref 0.0–0.5)
Eosinophils Relative: 3 %
HCT: 28.3 % — ABNORMAL LOW (ref 39.0–52.0)
Hemoglobin: 8.6 g/dL — ABNORMAL LOW (ref 13.0–17.0)
Immature Granulocytes: 1 %
Lymphocytes Relative: 15 %
Lymphs Abs: 1.4 10*3/uL (ref 0.7–4.0)
MCH: 20.8 pg — ABNORMAL LOW (ref 26.0–34.0)
MCHC: 30.4 g/dL (ref 30.0–36.0)
MCV: 68.5 fL — ABNORMAL LOW (ref 80.0–100.0)
Monocytes Absolute: 1 10*3/uL (ref 0.1–1.0)
Monocytes Relative: 11 %
Neutro Abs: 6.2 10*3/uL (ref 1.7–7.7)
Neutrophils Relative %: 69 %
Platelets: 728 10*3/uL — ABNORMAL HIGH (ref 150–400)
RBC: 4.13 MIL/uL — ABNORMAL LOW (ref 4.22–5.81)
RDW: 24.2 % — ABNORMAL HIGH (ref 11.5–15.5)
WBC: 8.8 10*3/uL (ref 4.0–10.5)
nRBC: 0 % (ref 0.0–0.2)

## 2020-06-03 LAB — BPAM RBC
Blood Product Expiration Date: 202107312359
Blood Product Expiration Date: 202107312359
ISSUE DATE / TIME: 202106251214
ISSUE DATE / TIME: 202106251638
Unit Type and Rh: 5100
Unit Type and Rh: 5100

## 2020-06-03 LAB — GLUCOSE, CAPILLARY
Glucose-Capillary: 112 mg/dL — ABNORMAL HIGH (ref 70–99)
Glucose-Capillary: 107 mg/dL — ABNORMAL HIGH (ref 70–99)

## 2020-06-03 MED ORDER — KETOCONAZOLE 2 % EX CREA: TOPICAL_CREAM | Freq: Two times a day (BID) | CUTANEOUS | 1 refills | Status: DC

## 2020-06-03 MED ORDER — OXYCODONE HCL 5 MG PO TABS: 2.5000 mg | ORAL_TABLET | Freq: Four times a day (QID) | ORAL | 0 refills | Status: AC | PRN

## 2020-06-03 MED ORDER — SACCHAROMYCES BOULARDII 250 MG PO CAPS: 250.0000 mg | ORAL_CAPSULE | Freq: Two times a day (BID) | ORAL | 1 refills | Status: AC

## 2020-06-03 MED ORDER — INSULIN GLARGINE 100 UNIT/ML ~~LOC~~ SOLN: 18.0000 [IU] | Freq: Every day | SUBCUTANEOUS | Status: DC

## 2020-06-03 MED ORDER — FLUCONAZOLE 100 MG PO TABS: 100.0000 mg | ORAL_TABLET | Freq: Every day | ORAL | 0 refills | Status: AC

## 2020-06-03 NOTE — Progress Notes (Signed)
Nsg Discharge Note  Admit Date:  05/30/2020 Discharge date: 06/03/2020   Shawn [name]. to be D/C'd Home per MD order.  AVS completed.  Copy for chart, and copy for patient signed, and dated. Patient/caregiver able to verbalize understanding.  Discharge Medication: Allergies as of 06/03/2020      Reactions   Penicillins Hives   Tolerated cefazolin June 2021   Statins    Uncontrolled diarrhea      Medication List    STOP taking these medications   hydrochlorothiazide 25 MG tablet Commonly known as: HYDRODIURIL   sulfamethoxazole-trimethoprim 800-160 MG tablet Commonly known as: BACTRIM DS   triamcinolone cream 0.1 % Commonly known as: KENALOG     TAKE these medications   amLODipine 10 MG tablet Commonly known as: NORVASC Take 1 tablet (10 mg total) by mouth daily.   ceFAZolin  IVPB Commonly known as: ANCEF Inject 2 g into the vein every 8 (eight) hours. Indication:  Osteomyelitis First Dose: Yes Last Day of Therapy:  07/12/2020 Labs - Once weekly:  CBC/D and BMP, Labs - Every other week:  ESR and CRP Method of administration: IV Push Method of administration may be changed at the discretion of home infusion pharmacist based upon assessment of the patient and/or caregiver's ability to self-administer the medication ordered.   diclofenac sodium 1 % Gel Commonly known as: VOLTAREN APPLY TOPICALLY 4 TIMES DAILY. What changed:   how much to take  how to take this  when to take this  reasons to take this  additional instructions   Ensure Compact Liqd Take 237 mLs by mouth 3 (three) times daily. Ensure Premier---only 1gm of carbs per 8 0z   ezetimibe 10 MG tablet Commonly known as: ZETIA TAKE 1 TABLET BY MOUTH EVERY DAY   fluconazole 100 MG tablet Commonly known as: DIFLUCAN Take 1 tablet (100 mg total) by mouth daily for 5 days. Start taking on: June 04, 2020   gabapentin 300 MG capsule Commonly known as: NEURONTIN Take 2 capsules (600 mg total) by  mouth 2 (two) times daily.   insulin glargine 100 UNIT/ML injection Commonly known as: Lantus Inject 0.18 mLs (18 Units total) into the skin at bedtime. What changed: See the new instructions.   ketoconazole 2 % cream Commonly known as: NIZORAL Apply topically 2 (two) times daily.   methocarbamol 500 MG tablet Commonly known as: ROBAXIN TAKE 1 TABLET BY MOUTH 2 TIMES DAILY AS NEEDED FOR MUSCLE SPASMS. What changed:   how much to take  how to take this  when to take this  reasons to take this  additional instructions   omeprazole 40 MG capsule Commonly known as: PRILOSEC TAKE 1 CAPSULE BY MOUTH EVERY DAY What changed: how much to take   oxyCODONE 5 MG immediate release tablet Commonly known as: Oxy IR/ROXICODONE Take 0.5-1 tablets (2.5-5 mg total) by mouth every 6 (six) hours as needed for up to 5 days for pain.   saccharomyces boulardii 250 MG capsule Commonly known as: FLORASTOR Take 1 capsule (250 mg total) by mouth 2 (two) times daily.            Discharge Care Instructions  (From admission, onward)         Start     Ordered   06/02/20 0000  Change dressing on IV access line weekly and PRN  (Home infusion instructions - [facility] )        06/02/20 1232  Discharge Assessment: Vitals:   06/03/20 0858 06/03/20 1300  BP: 110/76 133/81  Pulse:  87  Resp:  18  Temp:  98.5 F (36.9 C)  SpO2:  99%   Skin clean, dry and intact without evidence of skin break down, no evidence of skin tears noted. Picc line in place for at home IV antibiotic therapy   D/c Instructions-Education: Discharge instructions given to patient/family with verbalized understanding. D/c education completed with patient/family including follow up instructions, medication list, d/c activities limitations if indicated, with other d/c instructions as indicated by MD - patient able to verbalize understanding, all questions fully answered. Patient instructed to  return to ED, call 911, or call MD for any changes in condition.  Patient escorted via [place], and D/C home via private auto.  Zenaida Deed, RN 06/03/2020 4:46 PM

## 2020-06-03 NOTE — Discharge Summary (Signed)
Physician Discharge Summary  [name] PFY:[id number] DOB: [birth year] DOA: 05/30/2020  PCP: [name], MD Wound Care: Hanley Hays, Caren Macadam  ([place])  Admit date: 05/30/2020 Discharge date: 06/03/2020  Admitted From:  Home with [facility] 24/7 supervision  Disposition:  Home with [facility] 24/7 supervision   Recommendations for Outpatient Follow-up:  1. Follow up with [facility] as soon as possible 2. Follow up with infectious diseases as scheduled in August 3. Follow up with primary care as scheduled 4. Outpatient Palliative Care Consult strongly recommended.   Home Health: RN wound care, infusion therapy  Discharge Condition: STABLE   CODE STATUS: FULL    Brief Hospitalization Summary: Please see all hospital notes, images, labs for full details of the hospitalization.  ADMISSION HPI: Shawn [name]. is a 69 y.o. male paraplegic with medical history significant for chronic pelvic osteomyelitis and chronic right ischial decubitus ulcer extending to the bone presents to eD complaining of mouth pain, sore throat, not tolerating orals well, worsening decubitus wounds despite being on a course of Bactrim that was started outpatient after he had a punch biopsy done and the wound culture results grew bacteria sensitive to Bactrim.  He was supposed to be on the Bactrim for total of 10 days.  He had also been referred to infectious disease.  He apparently has developed some watery diarrhea over the past several days since being on the antibiotics.  He has lost about 20 pounds of weight loss.  The plan was initially to transfer him to [facility] where he receives his medical care but they do not have any beds available.  Patient was admitted to [facility] for IV antibiotics and IV fluids.  The punch biopsy wound culture grew Proteus mirabilis and staph aureus.  Brief Admission History:  69 y.o.maleparaplegicwith medical history significantfor chronic pelvic  osteomyelitis and chronic right ischial decubitus ulcer extending to the bone presents to ED complaining of mouth pain, sore throat, not tolerating orals well, worsening decubitus wounds despitebeing on a course of Bactrim that was started outpatient after he had a punch biopsy done and the wound culture results grew bacteria sensitive to Bactrim. He was supposed to be on the Bactrim for total of 10 days. He had also been referred to infectious disease.  Assessment & Plan:   Principal Problem:   Sacral osteomyelitis ([place]) Active Problems:   Hyperlipidemia   Paraplegic spinal paralysis (HCC)   Tobacco use   Cataracts, bilateral   Opioid type dependence, continuous (HCC)   Hypertension   Decubitus ulcer of right ischium   S/P AKA (above knee amputation) unilateral, right    Diabetes mellitus (HCC)   Chronic osteomyelitis involving pelvic region and thigh   Chronic pain syndrome   Neurologic gait disorder   PARAPLEGIA   Phantom limb pain (HCC)   Protein-calorie malnutrition, severe  1. Yeast stomatitis / Thrush - improved with fluconazole therapy. He is eating and drinking much better now.  2. Hyponatremia - improved with IV fluids, likely was from dehydration and poor oral intake. He is eating and drinking much better now.  DC HCTZ.   3. Chronic sacral osteomyelitis and decubitus ulcer right ischium - Pt is followed closely by [facility] wound care center and recently had punch biopsy of wound with findings of MSSA and Proteus mirabilis.  He has been started on cefazolin IV to cover both organisms.  Unfortunately he failed oral treatments for this and wound has continued to worsen.  We have counseled with him and he has decided on PICC line therapy with IV cefazolin for home therapy.  He has an outpatient appointment with infectious disease scheduled for August.  He is familiar with PICC line therapy and has done well in the past.  His wife was also counseled about it and is agreeable.  PICC  line placed.  Blood cultures no growth to date.  Plan to DC home tomorrow with close outpatient follow up. He has close outpatient follow up with wound care clinic at [facility].  4. Chronic pain syndrome-we have resumed his home medications. 5. Anemia of chronic disease and acute blood loss anemia - Likely from dressing changes from wound.  Transfused 2 unit PRBC.  Hg has improved to 8.6.     6. Status post right AKA-wound is well-healed at this time he continues to have phantom pain which is being addressed with his pain management medications.  7. Type 2 diabetes mellitus with neuropathic pain-continue home regimen.  8. Essential hypertension-blood pressures well controlled and stable.  DC HCTZ.  9. Tobacco-patient counseled on smoking cessation and offered a nicotine patch.  DVT prophylaxis: Enoxaparin Code Status: Full code Family Communication: Wife updated bedside Disposition:  Home with [place] and 24/7 supervision (wife)   Discharge Diagnoses:  Principal Problem:   Sacral osteomyelitis ([place]) Active Problems:   Hyperlipidemia   Paraplegic spinal paralysis ([place])   Tobacco use   Cataracts, bilateral   Opioid type dependence, continuous ([place])   Hypertension   Decubitus ulcer of right ischium   S/P AKA (above knee amputation) unilateral, right (HCC)   Diabetes mellitus ([place])   Chronic osteomyelitis involving pelvic region and thigh (HCC)   Chronic pain syndrome   Neurologic gait disorder   PARAPLEGIA   Phantom limb pain (HCC)   Protein-calorie malnutrition, severe   Discharge Instructions: Discharge Instructions    [facility] pharmacist to adjust dose for Vancomycin, Aminoglycosides and other anti-infective therapies as requested by physician.   Complete by: As directed    [facility] to provide Cath Flo 17m   Complete by: As directed    Administer for PICC line occlusion and as ordered by physician for other access device issues.   Anaphylaxis Kit:  Provided to treat any anaphylactic reaction to the medication being provided to the patient if First Dose or when requested by physician   Complete by: As directed    Epinephrine 127mml vial / amp: Administer 0.13m26m0.13ml38mubcutaneously once for moderate to severe anaphylaxis, nurse to call physician and pharmacy when reaction occurs and call 911 if needed for immediate care   Diphenhydramine 50mg79mIV vial: Administer 25-50mg 40mM PRN for first dose reaction, rash, itching, mild reaction, nurse to call physician and pharmacy when reaction occurs   Sodium Chloride 0.9% NS 500ml I18mdminister if needed for hypovolemic blood pressure drop or as ordered by physician after call to physician with anaphylactic reaction   Change dressing on IV access line weekly and PRN   Complete by: As directed    Flush IV access with Sodium Chloride 0.9% and Heparin 10 units/ml or 100 units/ml   Complete by: As directed    Home infusion instructions - [facility]   Complete by: As directed    Instructions: Flush IV access with Sodium Chloride 0.9% and Heparin 10units/ml or 100units/ml   Change dressing on IV access line: Weekly and PRN   Instructions Cath Flo 2mg: Ad19mister for PICC Line occlusion and  as ordered by physician for other access device   [facility] pharmacist to adjust dose for: Vancomycin, Aminoglycosides and other anti-infective therapies as requested by physician   Method of administration may be changed at the discretion of home infusion pharmacist based upon assessment of the patient and/or caregiver's ability to self-administer the medication ordered   Complete by: As directed      Allergies as of 06/03/2020      Reactions   Penicillins Hives   Tolerated cefazolin June 2021   Statins    Uncontrolled diarrhea      Medication List    STOP taking these medications   hydrochlorothiazide 25 MG tablet Commonly known as: HYDRODIURIL   sulfamethoxazole-trimethoprim  800-160 MG tablet Commonly known as: BACTRIM DS   triamcinolone cream 0.1 % Commonly known as: KENALOG     TAKE these medications   amLODipine 10 MG tablet Commonly known as: NORVASC Take 1 tablet (10 mg total) by mouth daily.   ceFAZolin  IVPB Commonly known as: ANCEF Inject 2 g into the vein every 8 (eight) hours. Indication:  Osteomyelitis First Dose: Yes Last Day of Therapy:  07/12/2020 Labs - Once weekly:  CBC/D and BMP, Labs - Every other week:  ESR and CRP Method of administration: IV Push Method of administration may be changed at the discretion of home infusion pharmacist based upon assessment of the patient and/or caregiver's ability to self-administer the medication ordered.   diclofenac sodium 1 % Gel Commonly known as: VOLTAREN APPLY TOPICALLY 4 TIMES DAILY. What changed:   how much to take  how to take this  when to take this  reasons to take this  additional instructions   Ensure Compact Liqd Take 237 mLs by mouth 3 (three) times daily. Ensure Premier---only 1gm of carbs per 8 0z   ezetimibe 10 MG tablet Commonly known as: ZETIA TAKE 1 TABLET BY MOUTH EVERY DAY   fluconazole 100 MG tablet Commonly known as: DIFLUCAN Take 1 tablet (100 mg total) by mouth daily for 5 days. Start taking on: June 04, 2020   gabapentin 300 MG capsule Commonly known as: NEURONTIN Take 2 capsules (600 mg total) by mouth 2 (two) times daily.   insulin glargine 100 UNIT/ML injection Commonly known as: Lantus Inject 0.18 mLs (18 Units total) into the skin at bedtime. What changed: See the new instructions.   ketoconazole 2 % cream Commonly known as: NIZORAL Apply topically 2 (two) times daily.   methocarbamol 500 MG tablet Commonly known as: ROBAXIN TAKE 1 TABLET BY MOUTH 2 TIMES DAILY AS NEEDED FOR MUSCLE SPASMS. What changed:   how much to take  how to take this  when to take this  reasons to take this  additional instructions   omeprazole 40 MG  capsule Commonly known as: PRILOSEC TAKE 1 CAPSULE BY MOUTH EVERY DAY What changed: how much to take   oxyCODONE 5 MG immediate release tablet Commonly known as: Oxy IR/ROXICODONE Take 0.5-1 tablets (2.5-5 mg total) by mouth every 6 (six) hours as needed for up to 5 days for pain.   saccharomyces boulardii 250 MG capsule Commonly known as: FLORASTOR Take 1 capsule (250 mg total) by mouth 2 (two) times daily.            Discharge Care Instructions  (From admission, onward)         Start     Ordered   06/02/20 0000  Change dressing on IV access line weekly and PRN  (  Home infusion instructions - [facility] )        06/02/20 1232          Follow-up Information    [facility].   Specialty: Home Health Services Why: Home health RN       [facility] Follow up.   Why: For Home IV antibiotics Contact information: [phone]             Allergies  Allergen Reactions  . Penicillins Hives    Tolerated cefazolin June 2021  . Statins     Uncontrolled diarrhea   Allergies as of 06/03/2020      Reactions   Penicillins Hives   Tolerated cefazolin June 2021   Statins    Uncontrolled diarrhea      Medication List    STOP taking these medications   hydrochlorothiazide 25 MG tablet Commonly known as: HYDRODIURIL   sulfamethoxazole-trimethoprim 800-160 MG tablet Commonly known as: BACTRIM DS   triamcinolone cream 0.1 % Commonly known as: KENALOG     TAKE these medications   amLODipine 10 MG tablet Commonly known as: NORVASC Take 1 tablet (10 mg total) by mouth daily.   ceFAZolin  IVPB Commonly known as: ANCEF Inject 2 g into the vein every 8 (eight) hours. Indication:  Osteomyelitis First Dose: Yes Last Day of Therapy:  07/12/2020 Labs - Once weekly:  CBC/D and BMP, Labs - Every other week:  ESR and CRP Method of administration: IV Push Method of administration may be changed at the discretion  of home infusion pharmacist based upon assessment of the patient and/or caregiver's ability to self-administer the medication ordered.   diclofenac sodium 1 % Gel Commonly known as: VOLTAREN APPLY TOPICALLY 4 TIMES DAILY. What changed:   how much to take  how to take this  when to take this  reasons to take this  additional instructions   Ensure Compact Liqd Take 237 mLs by mouth 3 (three) times daily. Ensure Premier---only 1gm of carbs per 8 0z   ezetimibe 10 MG tablet Commonly known as: ZETIA TAKE 1 TABLET BY MOUTH EVERY DAY   fluconazole 100 MG tablet Commonly known as: DIFLUCAN Take 1 tablet (100 mg total) by mouth daily for 5 days. Start taking on: June 04, 2020   gabapentin 300 MG capsule Commonly known as: NEURONTIN Take 2 capsules (600 mg total) by mouth 2 (two) times daily.   insulin glargine 100 UNIT/ML injection Commonly known as: Lantus Inject 0.18 mLs (18 Units total) into the skin at bedtime. What changed: See the new instructions.   ketoconazole 2 % cream Commonly known as: NIZORAL Apply topically 2 (two) times daily.   methocarbamol 500 MG tablet Commonly known as: ROBAXIN TAKE 1 TABLET BY MOUTH 2 TIMES DAILY AS NEEDED FOR MUSCLE SPASMS. What changed:   how much to take  how to take this  when to take this  reasons to take this  additional instructions   omeprazole 40 MG capsule Commonly known as: PRILOSEC TAKE 1 CAPSULE BY MOUTH EVERY DAY What changed: how much to take   oxyCODONE 5 MG immediate release tablet Commonly known as: Oxy IR/ROXICODONE Take 0.5-1 tablets (2.5-5 mg total) by mouth every 6 (six) hours as needed for up to 5 days for pain.   saccharomyces boulardii 250 MG capsule Commonly known as: FLORASTOR Take 1 capsule (250 mg total) by mouth 2 (two) times daily.  Discharge Care Instructions  (From admission, onward)         Start     Ordered   06/02/20 0000  Change dressing on IV access line weekly  and PRN  (Home infusion instructions - [facility] )        06/02/20 1232          Procedures/Studies: CT ABDOMEN PELVIS W CONTRAST  Result Date: 05/31/2020 CLINICAL DATA:  Abdominal infection. Chronic osteomyelitis. Worsening erythema. EXAM: CT ABDOMEN AND PELVIS WITH CONTRAST TECHNIQUE: Multidetector CT imaging of the abdomen and pelvis was performed using the standard protocol following bolus administration of intravenous contrast. CONTRAST:  135m OMNIPAQUE IOHEXOL 300 MG/ML  SOLN COMPARISON:  CT abdomen pelvis 11/10/2008 FINDINGS: LOWER CHEST: Normal. HEPATOBILIARY: Normal hepatic contours. No intra- or extrahepatic biliary dilatation. The gallbladder is normal. PANCREAS: Normal pancreas. No ductal dilatation or peripancreatic fluid collection. SPLEEN: Normal. ADRENALS/URINARY TRACT: The adrenal glands are normal. There are numerous bilateral renal cysts. The largest on the right measures 5.3 cm and the largest on the left measures 4.4 cm. No hydronephrosis. Decompressed by Foley catheter. STOMACH/BOWEL: There is no hiatal hernia. Normal duodenal course and caliber. No small bowel dilatation or inflammation. There is ulceration extending to the skin surface dorsal to the rectum. There appears to be a sinus tract extending from the rectum to the external environment. Normal appendix. VASCULAR/LYMPHATIC: There is calcific atherosclerosis of the abdominal aorta. No abdominal or pelvic lymphadenopathy. REPRODUCTIVE: Normal prostate size with symmetric seminal vesicles. MUSCULOSKELETAL. There is a right ischial decubitus ulcer. The bone surface extends to the area of ulceration. There is multifocal lucency within the right ischial tuberosity. There is a sacral decubitus ulcer that abuts the dorsal surface of the distal sacrum. There is no definite sacral osteolysis. OTHER: None. IMPRESSION: 1. Ulceration dorsal to the distal rectum with suspected sinus tract extending to the skin surface and  external environment. This has been present on multiple prior examinations, extending back to 2009, but has increased in size. 2. Right ischial decubitus ulcer with multifocal lucency within the right ischial tuberosity, consistent with osteomyelitis. 3. Sacral decubitus ulcer that abuts the dorsal surface of the distal sacrum without definite sacral osteolysis. 4. Aortic Atherosclerosis (ICD10-I70.0). Electronically Signed   By: KUlyses JarredM.D.   On: 05/31/2020 00:32   DG Chest Portable 1 View  Result Date: 05/30/2020 CLINICAL DATA:  Shortness of breath. EXAM: PORTABLE CHEST 1 VIEW COMPARISON:  Apr 15, 2018 FINDINGS: Mild, diffuse chronic appearing increased lung markings are seen without evidence of acute infiltrate, pleural effusion or pneumothorax. The heart size and mediastinal contours are within normal limits. Radiopaque pedicle screws are seen overlying the visualized portion of the cervical spine. A radiopaque fusion plate and screws are also seen overlying the cervical spine. The visualized skeletal structures are otherwise unremarkable. IMPRESSION: No active disease. Electronically Signed   By: TVirgina NorfolkM.D.   On: 05/30/2020 22:25   UKoreaEKG SITE RITE  Result Date: 06/01/2020 If Site Rite image not attached, placement could not be confirmed due to current cardiac rhythm.     Subjective: Pt says he feels stronger after receiving the blood transfusion.  He would like to go home.  He has no specific complaints.    Discharge Exam: Vitals:   06/03/20 0510 06/03/20 0858  BP: (!) 143/98 110/76  Pulse: 90   Resp: 18   Temp: 98.9 F (37.2 C)   SpO2: 100%    Vitals:   06/02/20  2052 06/02/20 2109 06/03/20 0510 06/03/20 0858  BP:  116/69 (!) 143/98 110/76  Pulse:  85 90   Resp:  16 18   Temp:  99.3 F (37.4 C) 98.9 F (37.2 C)   TempSrc:  Oral Oral   SpO2: 97% 98% 100%   Weight:      Height:       General exam: thin, frail emaciated, chronically ill appearing paraplegic  male, Appears calm and comfortable  Respiratory system: Clear to auscultation. Respiratory effort normal. Cardiovascular system: normal S1 & S2 heard.  No JVD, murmurs, rubs, gallops or clicks. No pedal edema. Gastrointestinal system: Abdomen is nondistended, soft and nontender. No organomegaly or masses felt. Normal bowel sounds heard. Central nervous system: Alert and oriented. Paraplegic.  Extremities: right AKA.  no gross edema of LLE. Skin: severe decubitus ulcerations large black eschar on tail, severe excoriated skin, intertrigo rash in groin area.  Psychiatry: Judgement and insight appear normal. Mood flat.     The results of significant diagnostics from this hospitalization (including imaging, microbiology, ancillary and laboratory) are listed below for reference.     Microbiology: Recent Results (from the past 240 hour(s))  Culture, blood (routine x 2)     Status: None (Preliminary result)   Collection Time: 05/30/20 10:10 PM   Specimen: Right Antecubital; Blood  Result Value Ref Range Status   Specimen Description RIGHT ANTECUBITAL  Final   Special Requests   Final    BOTTLES DRAWN AEROBIC AND ANAEROBIC Blood Culture adequate volume   Culture   Final    NO GROWTH 4 DAYS Performed at [facility], [address], [place], [place] [address]    Report Status PENDING  Incomplete  Culture, blood (routine x 2)     Status: None (Preliminary result)   Collection Time: 05/30/20 10:10 PM   Specimen: BLOOD RIGHT HAND  Result Value Ref Range Status   Specimen Description BLOOD RIGHT HAND  Final   Special Requests   Final    BOTTLES DRAWN AEROBIC AND ANAEROBIC Blood Culture adequate volume   Culture   Final    NO GROWTH 4 DAYS Performed at [facility], [address], [place], [place] [address]    Report Status PENDING  Incomplete  SARS Coronavirus 2 by RT PCR (hospital order, performed in [place] hospital lab) Nasopharyngeal Nasopharyngeal Swab     Status: None   Collection  Time: 05/31/20 12:48 AM   Specimen: Nasopharyngeal Swab  Result Value Ref Range Status   SARS Coronavirus 2 NEGATIVE NEGATIVE Final    Comment: (NOTE) SARS-CoV-2 target nucleic acids are NOT DETECTED.  The SARS-CoV-2 RNA is generally detectable in upper and lower respiratory specimens during the acute phase of infection. The lowest concentration of SARS-CoV-2 viral copies this assay can detect is 250 copies / mL. A negative result does not preclude SARS-CoV-2 infection and should not be used as the sole basis for treatment or other patient management decisions.  A negative result may occur with improper specimen collection / handling, submission of specimen other than nasopharyngeal swab, presence of viral mutation(s) within the areas targeted by this assay, and inadequate number of viral copies (<250 copies / mL). A negative result must be combined with clinical observations, patient history, and epidemiological information.  Fact Sheet for Patients:   StrictlyIdeas.no  Fact Sheet for Healthcare Providers: BankingDealers.co.za  This test is not yet approved or  cleared by the [place] FDA and has been authorized for detection and/or diagnosis  of SARS-CoV-2 by FDA under an Emergency Use Authorization (EUA).  This EUA will remain in effect (meaning this test can be used) for the duration of the COVID-19 declaration under Section 564(b)(1) of the Act, 21 U.S.C. section 360bbb-3(b)(1), unless the authorization is terminated or revoked sooner.  Performed at [facility], [address], [place], [place] [address]   MRSA PCR Screening     Status: Abnormal   Collection Time: 06/01/20  2:23 PM   Specimen: Nasal Mucosa; Nasopharyngeal  Result Value Ref Range Status   MRSA by PCR POSITIVE (A) NEGATIVE Final    Comment:        The GeneXpert MRSA Assay (FDA approved for NASAL specimens only), is one component of a comprehensive MRSA  colonization surveillance program. It is not intended to diagnose MRSA infection nor to guide or monitor treatment for MRSA infections. RESULT CALLED TO, READ BACK BY AND VERIFIED WITH: HAISTON,T AT 2211 ON 6.24.21 BY ISLEY,B Performed at [facility], [address] [place], [place] [address]      Labs: BNP (last 3 results) No results for input(s): BNP in the last 8760 hours. Basic Metabolic Panel: Recent Labs  Lab 05/30/20 2201 05/31/20 0734 06/01/20 0432 06/02/20 0446  NA 127* 131* 132* 131*  K 4.2 3.4* 3.7 3.7  CL 96* 100 102 102  CO2 19* 17* 19* 20*  GLUCOSE 94 80 102* 89  BUN _0 CREATININE 0.63 0.58* 0.55* 0.47*  CALCIUM 7.8* 7.6* 7.4* 7.1*  MG  --   --  1.0* 1.9   Liver Function Tests: Recent Labs  Lab 05/30/20 2201  AST 13*  ALT 19  ALKPHOS 91  BILITOT 0.5  PROT 6.4*  ALBUMIN 1.8*   Recent Labs  Lab 05/30/20 2201  LIPASE 14   No results for input(s): AMMONIA in the last 168 hours. CBC: Recent Labs  Lab 05/30/20 2201 05/31/20 0734 06/01/20 0432 06/02/20 0446 06/03/20 0645  WBC 91.6* 94.5*  DUPLICATE REQUEST 8.6 7.3 8.8  NEUTROABS 10.6* 14.7*  PENDING 6.1 4.4 6.2  HGB 7.8* 7.3*  DUPLICATE REQUEST 7.4* 6.5* 8.6*  HCT 03.8* 88.2*  DUPLICATE REQUEST 80.0* 21.2* 28.3*  MCV 34.9* 17.9*  DUPLICATE REQUEST 15.0* 64.4* 68.5*  PLT 569* 794*  DUPLICATE REQUEST 801* 655* 728*   Cardiac Enzymes: No results for input(s): CKTOTAL, CKMB, CKMBINDEX, TROPONINI in the last 168 hours. BNP: Invalid input(s): POCBNP CBG: Recent Labs  Lab 06/02/20 0730 06/02/20 1141 06/02/20 1636 06/02/20 2105 06/03/20 0736  GLUCAP 84 125* 214* 117* 112*   D-Dimer No results for input(s): DDIMER in the last 72 hours. Hgb A1c No results for input(s): HGBA1C in the last 72 hours. Lipid Profile No results for input(s): CHOL, HDL, LDLCALC, TRIG, CHOLHDL, LDLDIRECT in the last 72 hours. Thyroid function studies No results for input(s): TSH, T4TOTAL,  T3FREE, THYROIDAB in the last 72 hours.  Invalid input(s): FREET3 Anemia work up No results for input(s): VITAMINB12, FOLATE, FERRITIN, TIBC, IRON, RETICCTPCT in the last 72 hours. Urinalysis    Component Value Date/Time   COLORURINE YELLOW 05/05/2019 1645   APPEARANCEUR CLOUDY (A) 05/05/2019 1645   LABSPEC 1.018 05/05/2019 1645   PHURINE 9.0 (H) 05/05/2019 1645   GLUCOSEU >=500 (A) 05/05/2019 1645   GLUCOSEU NEG mg/dL 07/27/2008 2043   HGBUR MODERATE (A) 05/05/2019 1645   BILIRUBINUR NEGATIVE 05/05/2019 1645   BILIRUBINUR Negative 05/05/2019 1411   KETONESUR 5 (A) 05/05/2019 [place] 05/05/2019 1645  PROTEINUR Positive (A) 05/05/2019 1411   UROBILINOGEN 0.2 05/05/2019 1411   UROBILINOGEN 0.2 06/30/2011 2000   NITRITE POSITIVE (A) 05/05/2019 1645   NITRITE Negative 05/05/2019 1411   LEUKOCYTESUR LARGE (A) 05/05/2019 1645   LEUKOCYTESUR Small (1+) (A) 05/05/2019 1411   Sepsis Labs Invalid input(s): PROCALCITONIN,  WBC,  LACTICIDVEN Microbiology Recent Results (from the past 240 hour(s))  Culture, blood (routine x 2)     Status: None (Preliminary result)   Collection Time: 05/30/20 10:10 PM   Specimen: Right Antecubital; Blood  Result Value Ref Range Status   Specimen Description RIGHT ANTECUBITAL  Final   Special Requests   Final    BOTTLES DRAWN AEROBIC AND ANAEROBIC Blood Culture adequate volume   Culture   Final    NO GROWTH 4 DAYS Performed at [facility], [address], [place], [place] [address]    Report Status PENDING  Incomplete  Culture, blood (routine x 2)     Status: None (Preliminary result)   Collection Time: 05/30/20 10:10 PM   Specimen: BLOOD RIGHT HAND  Result Value Ref Range Status   Specimen Description BLOOD RIGHT HAND  Final   Special Requests   Final    BOTTLES DRAWN AEROBIC AND ANAEROBIC Blood Culture adequate volume   Culture   Final    NO GROWTH 4 DAYS Performed at [facility], [address] [place], [place] [address]     Report Status PENDING  Incomplete  SARS Coronavirus 2 by RT PCR (hospital order, performed in [place] hospital lab) Nasopharyngeal Nasopharyngeal Swab     Status: None   Collection Time: 05/31/20 12:48 AM   Specimen: Nasopharyngeal Swab  Result Value Ref Range Status   SARS Coronavirus 2 NEGATIVE NEGATIVE Final    Comment: (NOTE) SARS-CoV-2 target nucleic acids are NOT DETECTED.  The SARS-CoV-2 RNA is generally detectable in upper and lower respiratory specimens during the acute phase of infection. The lowest concentration of SARS-CoV-2 viral copies this assay can detect is 250 copies / mL. A negative result does not preclude SARS-CoV-2 infection and should not be used as the sole basis for treatment or other patient management decisions.  A negative result may occur with improper specimen collection / handling, submission of specimen other than nasopharyngeal swab, presence of viral mutation(s) within the areas targeted by this assay, and inadequate number of viral copies (<250 copies / mL). A negative result must be combined with clinical observations, patient history, and epidemiological information.  Fact Sheet for Patients:   StrictlyIdeas.no  Fact Sheet for Healthcare Providers: BankingDealers.co.za  This test is not yet approved or  cleared by the [place] FDA and has been authorized for detection and/or diagnosis of SARS-CoV-2 by FDA under an Emergency Use Authorization (EUA).  This EUA will remain in effect (meaning this test can be used) for the duration of the COVID-19 declaration under Section 564(b)(1) of the Act, 21 U.S.C. section 360bbb-3(b)(1), unless the authorization is terminated or revoked sooner.  Performed at [facility], [address] [place], [place] [address]   MRSA PCR Screening     Status: Abnormal   Collection Time: 06/01/20  2:23 PM   Specimen: Nasal Mucosa; Nasopharyngeal  Result Value  Ref Range Status   MRSA by PCR POSITIVE (A) NEGATIVE Final    Comment:        The GeneXpert MRSA Assay (FDA approved for NASAL specimens only), is one component of a comprehensive MRSA colonization surveillance program. It is not intended to  diagnose MRSA infection nor to guide or monitor treatment for MRSA infections. RESULT CALLED TO, READ BACK BY AND VERIFIED WITH: HAISTON,T AT 2211 ON 6.24.21 BY ISLEY,B Performed at [facility], [address], [place], [place] [address]    Time coordinating discharge: 43 minutes   SIGNED:  Irwin Brakeman, MD  [facility] 06/03/2020, 10:14 AM How to contact the [facility] Attending or Consulting provider [place] or covering provider during after hours [place], for this patient?  1. Check the care team in [facility] and look for a) attending/consulting TRH provider listed and b) the [facility] team listed 2. Log into [email or web address] and use [place]'s universal password to access. If you do not have the password, please contact the hospital operator. 3. Locate the [facility] provider you are looking for under [facility] and page to a number that you can be directly reached. 4. If you still have difficulty reaching the provider, please page the [facility] (Director on Call) for the Hospitalists listed on amion for assistance.

## 2020-06-03 NOTE — TOC Transition Note (Signed)
Transition of Care [facility]) - CM/SW Discharge Note   Patient Details  Name: Shawn [name]. MRN: [id number] Date of Birth: 06-23-1951  Transition of Care [facility]) CM/SW Contact:  Natasha Bence, LCSW Phone Number: 06/03/2020, 12:22 PM   Clinical Narrative:    CSW notified patient and [facility] of Pt discharge. Pam with [facility] notified of dose of IV abx. Patient does not need to remain in hospital until 2pm due to medication administered and futher medication delivered to home. Notified Corene Cornea with [facility] of discharge. Medical necessity form printed and provided to Nurse. [facility] called for transport. TOC signing off.     Final next level of care: Home w Hospice Care Barriers to Discharge: Barriers Resolved   Patient Goals and CMS Choice Patient states their goals for this hospitalization and ongoing recovery are:: Return home with [facility] infusion CMS Medicare.gov Compare Post Acute Care list provided to:: Patient Choice offered to / list presented to : Patient  Discharge Placement                Patient to be transferred to facility by: [facility] Name of family member notified: Corrin Parker Patient and family notified of of transfer: 06/03/20  Discharge Plan and Services In-house Referral: Clinical Social Work Discharge Planning Services: NA Post Acute Care Choice: Home Health                    HH Arranged: RN [facility] Agency: [place] ([place]) Date [place]: 06/03/20   Representative spoke with at [place]: [place]  Social Determinants of Health ([place]) Interventions     Readmission Risk Interventions No flowsheet data found.

## 2020-06-04 DIAGNOSIS — I10 Essential (primary) hypertension: Secondary | ICD-10-CM | POA: Diagnosis not present

## 2020-06-04 DIAGNOSIS — L89314 Pressure ulcer of right buttock, stage 4: Secondary | ICD-10-CM | POA: Diagnosis not present

## 2020-06-04 DIAGNOSIS — M8638 Chronic multifocal osteomyelitis, other site: Secondary | ICD-10-CM | POA: Diagnosis not present

## 2020-06-04 DIAGNOSIS — E1169 Type 2 diabetes mellitus with other specified complication: Secondary | ICD-10-CM | POA: Diagnosis not present

## 2020-06-04 DIAGNOSIS — L989 Disorder of the skin and subcutaneous tissue, unspecified: Secondary | ICD-10-CM | POA: Diagnosis not present

## 2020-06-04 DIAGNOSIS — L98419 Non-pressure chronic ulcer of buttock with unspecified severity: Secondary | ICD-10-CM | POA: Diagnosis not present

## 2020-06-05 ENCOUNTER — Encounter: Payer: Self-pay | Admitting: *Deleted

## 2020-06-05 DIAGNOSIS — L989 Disorder of the skin and subcutaneous tissue, unspecified: Secondary | ICD-10-CM | POA: Diagnosis not present

## 2020-06-05 DIAGNOSIS — M8638 Chronic multifocal osteomyelitis, other site: Secondary | ICD-10-CM | POA: Diagnosis not present

## 2020-06-05 DIAGNOSIS — L89314 Pressure ulcer of right buttock, stage 4: Secondary | ICD-10-CM | POA: Diagnosis not present

## 2020-06-05 DIAGNOSIS — Z4542 Encounter for adjustment and management of neuropacemaker (brain) (peripheral nerve) (spinal cord): Secondary | ICD-10-CM | POA: Diagnosis not present

## 2020-06-05 DIAGNOSIS — I10 Essential (primary) hypertension: Secondary | ICD-10-CM | POA: Diagnosis not present

## 2020-06-05 DIAGNOSIS — E1169 Type 2 diabetes mellitus with other specified complication: Secondary | ICD-10-CM | POA: Diagnosis not present

## 2020-06-05 DIAGNOSIS — L98419 Non-pressure chronic ulcer of buttock with unspecified severity: Secondary | ICD-10-CM | POA: Diagnosis not present

## 2020-06-06 LAB — CULTURE, BLOOD (ROUTINE X 2)
Culture: NO GROWTH
Culture: NO GROWTH
Special Requests: ADEQUATE
Special Requests: ADEQUATE

## 2020-06-12 ENCOUNTER — Other Ambulatory Visit (HOSPITAL_COMMUNITY)
Admission: RE | Admit: 2020-06-12 | Discharge: 2020-06-12 | Disposition: A | Discharge status: Home / Self Care | Payer: Medicare Other | Source: Other Acute Inpatient Hospital | Attending: Plastic Surgery | Admitting: Plastic Surgery

## 2020-06-12 DIAGNOSIS — M4628 Osteomyelitis of vertebra, sacral and sacrococcygeal region: Secondary | ICD-10-CM | POA: Diagnosis not present

## 2020-06-12 DIAGNOSIS — L989 Disorder of the skin and subcutaneous tissue, unspecified: Secondary | ICD-10-CM | POA: Diagnosis not present

## 2020-06-12 DIAGNOSIS — L89314 Pressure ulcer of right buttock, stage 4: Secondary | ICD-10-CM | POA: Diagnosis not present

## 2020-06-12 DIAGNOSIS — L98419 Non-pressure chronic ulcer of buttock with unspecified severity: Secondary | ICD-10-CM | POA: Diagnosis not present

## 2020-06-12 DIAGNOSIS — I10 Essential (primary) hypertension: Secondary | ICD-10-CM | POA: Diagnosis not present

## 2020-06-12 DIAGNOSIS — E1169 Type 2 diabetes mellitus with other specified complication: Secondary | ICD-10-CM | POA: Diagnosis not present

## 2020-06-12 DIAGNOSIS — M8638 Chronic multifocal osteomyelitis, other site: Secondary | ICD-10-CM | POA: Diagnosis not present

## 2020-06-12 LAB — CBC WITH DIFFERENTIAL/PLATELET
Abs Immature Granulocytes: 0.09 10*3/uL — ABNORMAL HIGH (ref 0.00–0.07)
Basophils Absolute: 0 10*3/uL (ref 0.0–0.1)
Basophils Relative: 1 %
Eosinophils Absolute: 0.1 10*3/uL (ref 0.0–0.5)
Eosinophils Relative: 1 %
HCT: 32.5 % — ABNORMAL LOW (ref 39.0–52.0)
Hemoglobin: 9.8 g/dL — ABNORMAL LOW (ref 13.0–17.0)
Immature Granulocytes: 1 %
Lymphocytes Relative: 15 %
Lymphs Abs: 1.4 10*3/uL (ref 0.7–4.0)
MCH: 20.5 pg — ABNORMAL LOW (ref 26.0–34.0)
MCHC: 30.2 g/dL (ref 30.0–36.0)
MCV: 67.8 fL — ABNORMAL LOW (ref 80.0–100.0)
Monocytes Absolute: 1 10*3/uL (ref 0.1–1.0)
Monocytes Relative: 11 %
Neutro Abs: 6.3 10*3/uL (ref 1.7–7.7)
Neutrophils Relative %: 71 %
Platelets: 740 10*3/uL — ABNORMAL HIGH (ref 150–400)
RBC: 4.79 MIL/uL (ref 4.22–5.81)
RDW: 25.2 % — ABNORMAL HIGH (ref 11.5–15.5)
WBC: 8.8 10*3/uL (ref 4.0–10.5)
nRBC: 0 % (ref 0.0–0.2)

## 2020-06-12 LAB — BASIC METABOLIC PANEL
Anion gap: 14 (ref 5–15)
BUN: 11 mg/dL (ref 8–23)
CO2: 24 mmol/L (ref 22–32)
Calcium: 7 mg/dL — ABNORMAL LOW (ref 8.9–10.3)
Chloride: 92 mmol/L — ABNORMAL LOW (ref 98–111)
Creatinine, Ser: 0.53 mg/dL — ABNORMAL LOW (ref 0.61–1.24)
GFR calc Af Amer: >60 mL/min (ref >60)
GFR calc non Af Amer: >60 mL/min (ref >60)
Glucose, Bld: 75 mg/dL (ref 70–99)
Potassium: 3 mmol/L — ABNORMAL LOW (ref 3.5–5.1)
Sodium: 130 mmol/L — ABNORMAL LOW (ref 135–145)

## 2020-06-14 DIAGNOSIS — L89313 Pressure ulcer of right buttock, stage 3: Secondary | ICD-10-CM | POA: Diagnosis not present

## 2020-06-14 DIAGNOSIS — D638 Anemia in other chronic diseases classified elsewhere: Secondary | ICD-10-CM | POA: Diagnosis not present

## 2020-06-14 DIAGNOSIS — I739 Peripheral vascular disease, unspecified: Secondary | ICD-10-CM | POA: Diagnosis not present

## 2020-06-14 DIAGNOSIS — G8220 Paraplegia, unspecified: Secondary | ICD-10-CM | POA: Diagnosis not present

## 2020-06-14 DIAGNOSIS — R52 Pain, unspecified: Secondary | ICD-10-CM | POA: Diagnosis not present

## 2020-06-14 DIAGNOSIS — F17210 Nicotine dependence, cigarettes, uncomplicated: Secondary | ICD-10-CM | POA: Diagnosis not present

## 2020-06-14 DIAGNOSIS — Z743 Need for continuous supervision: Secondary | ICD-10-CM | POA: Diagnosis not present

## 2020-06-14 DIAGNOSIS — R638 Other symptoms and signs concerning food and fluid intake: Secondary | ICD-10-CM | POA: Diagnosis not present

## 2020-06-14 DIAGNOSIS — R0902 Hypoxemia: Secondary | ICD-10-CM | POA: Diagnosis not present

## 2020-06-14 DIAGNOSIS — D62 Acute posthemorrhagic anemia: Secondary | ICD-10-CM | POA: Diagnosis not present

## 2020-06-14 DIAGNOSIS — E161 Other hypoglycemia: Secondary | ICD-10-CM | POA: Diagnosis not present

## 2020-06-14 DIAGNOSIS — E785 Hyperlipidemia, unspecified: Secondary | ICD-10-CM | POA: Diagnosis not present

## 2020-06-14 DIAGNOSIS — L89309 Pressure ulcer of unspecified buttock, unspecified stage: Secondary | ICD-10-CM | POA: Diagnosis not present

## 2020-06-14 DIAGNOSIS — E1151 Type 2 diabetes mellitus with diabetic peripheral angiopathy without gangrene: Secondary | ICD-10-CM | POA: Diagnosis not present

## 2020-06-14 DIAGNOSIS — L89213 Pressure ulcer of right hip, stage 3: Secondary | ICD-10-CM | POA: Diagnosis not present

## 2020-06-14 DIAGNOSIS — E878 Other disorders of electrolyte and fluid balance, not elsewhere classified: Secondary | ICD-10-CM | POA: Diagnosis not present

## 2020-06-14 DIAGNOSIS — E860 Dehydration: Secondary | ICD-10-CM | POA: Diagnosis not present

## 2020-06-14 DIAGNOSIS — B955 Unspecified streptococcus as the cause of diseases classified elsewhere: Secondary | ICD-10-CM | POA: Diagnosis not present

## 2020-06-14 DIAGNOSIS — D72829 Elevated white blood cell count, unspecified: Secondary | ICD-10-CM | POA: Diagnosis not present

## 2020-06-14 DIAGNOSIS — E876 Hypokalemia: Secondary | ICD-10-CM | POA: Diagnosis not present

## 2020-06-14 DIAGNOSIS — L89154 Pressure ulcer of sacral region, stage 4: Secondary | ICD-10-CM | POA: Diagnosis not present

## 2020-06-14 DIAGNOSIS — T8789 Other complications of amputation stump: Secondary | ICD-10-CM | POA: Diagnosis not present

## 2020-06-14 DIAGNOSIS — Z7189 Other specified counseling: Secondary | ICD-10-CM | POA: Diagnosis not present

## 2020-06-14 DIAGNOSIS — Z9114 Patient's other noncompliance with medication regimen: Secondary | ICD-10-CM | POA: Diagnosis not present

## 2020-06-14 DIAGNOSIS — R7881 Bacteremia: Secondary | ICD-10-CM | POA: Diagnosis not present

## 2020-06-14 DIAGNOSIS — L89303 Pressure ulcer of unspecified buttock, stage 3: Secondary | ICD-10-CM | POA: Diagnosis not present

## 2020-06-14 DIAGNOSIS — D500 Iron deficiency anemia secondary to blood loss (chronic): Secondary | ICD-10-CM | POA: Diagnosis not present

## 2020-06-14 DIAGNOSIS — I10 Essential (primary) hypertension: Secondary | ICD-10-CM | POA: Diagnosis not present

## 2020-06-14 DIAGNOSIS — Z20822 Contact with and (suspected) exposure to covid-19: Secondary | ICD-10-CM | POA: Diagnosis not present

## 2020-06-14 DIAGNOSIS — Z89612 Acquired absence of left leg above knee: Secondary | ICD-10-CM | POA: Diagnosis not present

## 2020-06-14 DIAGNOSIS — D509 Iron deficiency anemia, unspecified: Secondary | ICD-10-CM | POA: Diagnosis not present

## 2020-06-14 DIAGNOSIS — B3789 Other sites of candidiasis: Secondary | ICD-10-CM | POA: Diagnosis not present

## 2020-06-14 DIAGNOSIS — Z89611 Acquired absence of right leg above knee: Secondary | ICD-10-CM | POA: Diagnosis not present

## 2020-06-14 DIAGNOSIS — E11649 Type 2 diabetes mellitus with hypoglycemia without coma: Secondary | ICD-10-CM | POA: Diagnosis not present

## 2020-06-14 DIAGNOSIS — A419 Sepsis, unspecified organism: Secondary | ICD-10-CM | POA: Diagnosis not present

## 2020-06-14 DIAGNOSIS — R630 Anorexia: Secondary | ICD-10-CM | POA: Diagnosis not present

## 2020-06-14 DIAGNOSIS — K219 Gastro-esophageal reflux disease without esophagitis: Secondary | ICD-10-CM | POA: Diagnosis not present

## 2020-06-14 DIAGNOSIS — N319 Neuromuscular dysfunction of bladder, unspecified: Secondary | ICD-10-CM | POA: Diagnosis not present

## 2020-06-14 DIAGNOSIS — Z794 Long term (current) use of insulin: Secondary | ICD-10-CM | POA: Diagnosis not present

## 2020-06-14 DIAGNOSIS — L89159 Pressure ulcer of sacral region, unspecified stage: Secondary | ICD-10-CM | POA: Diagnosis not present

## 2020-06-14 DIAGNOSIS — R627 Adult failure to thrive: Secondary | ICD-10-CM | POA: Diagnosis not present

## 2020-06-14 DIAGNOSIS — L8944 Pressure ulcer of contiguous site of back, buttock and hip, stage 4: Secondary | ICD-10-CM | POA: Diagnosis not present

## 2020-06-14 DIAGNOSIS — Z79899 Other long term (current) drug therapy: Secondary | ICD-10-CM | POA: Diagnosis not present

## 2020-06-14 DIAGNOSIS — R279 Unspecified lack of coordination: Secondary | ICD-10-CM | POA: Diagnosis not present

## 2020-06-14 DIAGNOSIS — J020 Streptococcal pharyngitis: Secondary | ICD-10-CM | POA: Diagnosis not present

## 2020-06-14 DIAGNOSIS — K5641 Fecal impaction: Secondary | ICD-10-CM | POA: Diagnosis not present

## 2020-06-14 DIAGNOSIS — D649 Anemia, unspecified: Secondary | ICD-10-CM | POA: Diagnosis not present

## 2020-06-14 DIAGNOSIS — E46 Unspecified protein-calorie malnutrition: Secondary | ICD-10-CM | POA: Diagnosis not present

## 2020-06-14 DIAGNOSIS — L89314 Pressure ulcer of right buttock, stage 4: Secondary | ICD-10-CM | POA: Diagnosis not present

## 2020-06-14 DIAGNOSIS — L89323 Pressure ulcer of left buttock, stage 3: Secondary | ICD-10-CM | POA: Diagnosis not present

## 2020-06-14 DIAGNOSIS — E440 Moderate protein-calorie malnutrition: Secondary | ICD-10-CM | POA: Diagnosis not present

## 2020-06-14 DIAGNOSIS — G8929 Other chronic pain: Secondary | ICD-10-CM | POA: Diagnosis not present

## 2020-06-14 DIAGNOSIS — Z6825 Body mass index (BMI) 25.0-25.9, adult: Secondary | ICD-10-CM | POA: Diagnosis not present

## 2020-06-14 DIAGNOSIS — E162 Hypoglycemia, unspecified: Secondary | ICD-10-CM | POA: Diagnosis not present

## 2020-06-14 DIAGNOSIS — E871 Hypo-osmolality and hyponatremia: Secondary | ICD-10-CM | POA: Diagnosis not present

## 2020-06-14 DIAGNOSIS — K591 Functional diarrhea: Secondary | ICD-10-CM | POA: Diagnosis not present

## 2020-06-14 DIAGNOSIS — Z66 Do not resuscitate: Secondary | ICD-10-CM | POA: Diagnosis not present

## 2020-06-14 DIAGNOSIS — L89324 Pressure ulcer of left buttock, stage 4: Secondary | ICD-10-CM | POA: Diagnosis not present

## 2020-06-23 DIAGNOSIS — E7800 Pure hypercholesterolemia, unspecified: Secondary | ICD-10-CM | POA: Diagnosis not present

## 2020-06-23 DIAGNOSIS — Z7401 Bed confinement status: Secondary | ICD-10-CM | POA: Diagnosis not present

## 2020-06-23 DIAGNOSIS — L89159 Pressure ulcer of sacral region, unspecified stage: Secondary | ICD-10-CM | POA: Diagnosis not present

## 2020-06-23 DIAGNOSIS — G8929 Other chronic pain: Secondary | ICD-10-CM | POA: Diagnosis not present

## 2020-06-23 DIAGNOSIS — L89314 Pressure ulcer of right buttock, stage 4: Secondary | ICD-10-CM | POA: Diagnosis not present

## 2020-06-23 DIAGNOSIS — Z743 Need for continuous supervision: Secondary | ICD-10-CM | POA: Diagnosis not present

## 2020-06-23 DIAGNOSIS — L8944 Pressure ulcer of contiguous site of back, buttock and hip, stage 4: Secondary | ICD-10-CM | POA: Diagnosis not present

## 2020-06-23 DIAGNOSIS — L89319 Pressure ulcer of right buttock, unspecified stage: Secondary | ICD-10-CM | POA: Diagnosis not present

## 2020-06-23 DIAGNOSIS — B955 Unspecified streptococcus as the cause of diseases classified elsewhere: Secondary | ICD-10-CM | POA: Diagnosis not present

## 2020-06-23 DIAGNOSIS — L89323 Pressure ulcer of left buttock, stage 3: Secondary | ICD-10-CM | POA: Diagnosis not present

## 2020-06-23 DIAGNOSIS — G839 Paralytic syndrome, unspecified: Secondary | ICD-10-CM | POA: Diagnosis not present

## 2020-06-23 DIAGNOSIS — E119 Type 2 diabetes mellitus without complications: Secondary | ICD-10-CM | POA: Diagnosis not present

## 2020-06-23 DIAGNOSIS — L89153 Pressure ulcer of sacral region, stage 3: Secondary | ICD-10-CM | POA: Diagnosis not present

## 2020-06-23 DIAGNOSIS — R279 Unspecified lack of coordination: Secondary | ICD-10-CM | POA: Diagnosis not present

## 2020-06-23 DIAGNOSIS — R627 Adult failure to thrive: Secondary | ICD-10-CM | POA: Diagnosis not present

## 2020-06-23 DIAGNOSIS — R0902 Hypoxemia: Secondary | ICD-10-CM | POA: Diagnosis not present

## 2020-06-23 DIAGNOSIS — G8220 Paraplegia, unspecified: Secondary | ICD-10-CM | POA: Diagnosis not present

## 2020-06-23 DIAGNOSIS — E039 Hypothyroidism, unspecified: Secondary | ICD-10-CM | POA: Diagnosis not present

## 2020-06-23 DIAGNOSIS — R000 Tachycardia, unspecified: Secondary | ICD-10-CM | POA: Diagnosis not present

## 2020-06-23 DIAGNOSIS — Z79899 Other long term (current) drug therapy: Secondary | ICD-10-CM | POA: Diagnosis not present

## 2020-06-23 DIAGNOSIS — E878 Other disorders of electrolyte and fluid balance, not elsewhere classified: Secondary | ICD-10-CM | POA: Diagnosis not present

## 2020-06-23 DIAGNOSIS — I10 Essential (primary) hypertension: Secondary | ICD-10-CM | POA: Diagnosis not present

## 2020-06-23 DIAGNOSIS — M8580 Other specified disorders of bone density and structure, unspecified site: Secondary | ICD-10-CM | POA: Diagnosis not present

## 2020-06-23 DIAGNOSIS — K5902 Outlet dysfunction constipation: Secondary | ICD-10-CM | POA: Diagnosis not present

## 2020-06-23 DIAGNOSIS — D649 Anemia, unspecified: Secondary | ICD-10-CM | POA: Diagnosis not present

## 2020-06-23 DIAGNOSIS — Z89611 Acquired absence of right leg above knee: Secondary | ICD-10-CM | POA: Diagnosis not present

## 2020-06-23 DIAGNOSIS — E782 Mixed hyperlipidemia: Secondary | ICD-10-CM | POA: Diagnosis not present

## 2020-06-23 DIAGNOSIS — E162 Hypoglycemia, unspecified: Secondary | ICD-10-CM | POA: Diagnosis not present

## 2020-06-23 DIAGNOSIS — L89313 Pressure ulcer of right buttock, stage 3: Secondary | ICD-10-CM | POA: Diagnosis not present

## 2020-06-23 DIAGNOSIS — E785 Hyperlipidemia, unspecified: Secondary | ICD-10-CM | POA: Diagnosis not present

## 2020-06-23 DIAGNOSIS — D518 Other vitamin B12 deficiency anemias: Secondary | ICD-10-CM | POA: Diagnosis not present

## 2020-06-23 DIAGNOSIS — I739 Peripheral vascular disease, unspecified: Secondary | ICD-10-CM | POA: Diagnosis not present

## 2020-06-23 DIAGNOSIS — E11649 Type 2 diabetes mellitus with hypoglycemia without coma: Secondary | ICD-10-CM | POA: Diagnosis not present

## 2020-06-23 DIAGNOSIS — E46 Unspecified protein-calorie malnutrition: Secondary | ICD-10-CM | POA: Diagnosis not present

## 2020-06-23 DIAGNOSIS — D62 Acute posthemorrhagic anemia: Secondary | ICD-10-CM | POA: Diagnosis not present

## 2020-06-23 DIAGNOSIS — L89309 Pressure ulcer of unspecified buttock, unspecified stage: Secondary | ICD-10-CM | POA: Diagnosis not present

## 2020-06-23 DIAGNOSIS — L89154 Pressure ulcer of sacral region, stage 4: Secondary | ICD-10-CM | POA: Diagnosis not present

## 2020-06-23 DIAGNOSIS — N319 Neuromuscular dysfunction of bladder, unspecified: Secondary | ICD-10-CM | POA: Diagnosis not present

## 2020-06-23 DIAGNOSIS — R5381 Other malaise: Secondary | ICD-10-CM | POA: Diagnosis not present

## 2020-06-23 DIAGNOSIS — E559 Vitamin D deficiency, unspecified: Secondary | ICD-10-CM | POA: Diagnosis not present

## 2020-06-23 DIAGNOSIS — A419 Sepsis, unspecified organism: Secondary | ICD-10-CM | POA: Diagnosis not present

## 2020-06-23 DIAGNOSIS — Z6825 Body mass index (BMI) 25.0-25.9, adult: Secondary | ICD-10-CM | POA: Diagnosis not present

## 2020-06-23 DIAGNOSIS — R69 Illness, unspecified: Secondary | ICD-10-CM | POA: Diagnosis not present

## 2020-06-23 DIAGNOSIS — R7881 Bacteremia: Secondary | ICD-10-CM | POA: Diagnosis not present

## 2020-06-23 DIAGNOSIS — E440 Moderate protein-calorie malnutrition: Secondary | ICD-10-CM | POA: Diagnosis not present

## 2020-06-28 DIAGNOSIS — G8220 Paraplegia, unspecified: Secondary | ICD-10-CM | POA: Diagnosis not present

## 2020-06-28 DIAGNOSIS — R627 Adult failure to thrive: Secondary | ICD-10-CM | POA: Diagnosis not present

## 2020-06-28 DIAGNOSIS — E119 Type 2 diabetes mellitus without complications: Secondary | ICD-10-CM | POA: Diagnosis not present

## 2020-06-28 DIAGNOSIS — E878 Other disorders of electrolyte and fluid balance, not elsewhere classified: Secondary | ICD-10-CM | POA: Diagnosis not present

## 2020-06-28 DIAGNOSIS — D649 Anemia, unspecified: Secondary | ICD-10-CM | POA: Diagnosis not present

## 2020-06-28 DIAGNOSIS — L89159 Pressure ulcer of sacral region, unspecified stage: Secondary | ICD-10-CM | POA: Diagnosis not present

## 2020-06-28 DIAGNOSIS — R7881 Bacteremia: Secondary | ICD-10-CM | POA: Diagnosis not present

## 2020-06-30 DIAGNOSIS — E46 Unspecified protein-calorie malnutrition: Secondary | ICD-10-CM | POA: Diagnosis not present

## 2020-06-30 DIAGNOSIS — D649 Anemia, unspecified: Secondary | ICD-10-CM | POA: Diagnosis not present

## 2020-06-30 DIAGNOSIS — K5902 Outlet dysfunction constipation: Secondary | ICD-10-CM | POA: Diagnosis not present

## 2020-06-30 DIAGNOSIS — L89159 Pressure ulcer of sacral region, unspecified stage: Secondary | ICD-10-CM | POA: Diagnosis not present

## 2020-07-12 DIAGNOSIS — L89314 Pressure ulcer of right buttock, stage 4: Secondary | ICD-10-CM | POA: Diagnosis not present

## 2020-07-12 DIAGNOSIS — M8580 Other specified disorders of bone density and structure, unspecified site: Secondary | ICD-10-CM | POA: Diagnosis not present

## 2020-07-12 DIAGNOSIS — E46 Unspecified protein-calorie malnutrition: Secondary | ICD-10-CM | POA: Diagnosis not present

## 2020-07-12 DIAGNOSIS — L89153 Pressure ulcer of sacral region, stage 3: Secondary | ICD-10-CM | POA: Diagnosis not present

## 2020-07-12 DIAGNOSIS — L89313 Pressure ulcer of right buttock, stage 3: Secondary | ICD-10-CM | POA: Diagnosis not present

## 2020-07-21 DIAGNOSIS — D649 Anemia, unspecified: Secondary | ICD-10-CM | POA: Diagnosis not present

## 2020-07-21 DIAGNOSIS — L89159 Pressure ulcer of sacral region, unspecified stage: Secondary | ICD-10-CM | POA: Diagnosis not present

## 2020-07-21 DIAGNOSIS — K5902 Outlet dysfunction constipation: Secondary | ICD-10-CM | POA: Diagnosis not present

## 2020-07-21 DIAGNOSIS — N319 Neuromuscular dysfunction of bladder, unspecified: Secondary | ICD-10-CM | POA: Diagnosis not present

## 2020-07-31 DIAGNOSIS — G8220 Paraplegia, unspecified: Secondary | ICD-10-CM | POA: Diagnosis not present

## 2020-07-31 DIAGNOSIS — L89159 Pressure ulcer of sacral region, unspecified stage: Secondary | ICD-10-CM | POA: Diagnosis not present

## 2020-07-31 DIAGNOSIS — E119 Type 2 diabetes mellitus without complications: Secondary | ICD-10-CM | POA: Diagnosis not present

## 2020-07-31 DIAGNOSIS — E46 Unspecified protein-calorie malnutrition: Secondary | ICD-10-CM | POA: Diagnosis not present

## 2020-08-15 ENCOUNTER — Encounter: Payer: Medicare Other | Admitting: Physical Medicine & Rehabilitation

## 2020-08-15 DIAGNOSIS — L89159 Pressure ulcer of sacral region, unspecified stage: Secondary | ICD-10-CM | POA: Diagnosis not present

## 2020-08-15 DIAGNOSIS — I10 Essential (primary) hypertension: Secondary | ICD-10-CM | POA: Diagnosis not present

## 2020-08-15 DIAGNOSIS — G839 Paralytic syndrome, unspecified: Secondary | ICD-10-CM | POA: Diagnosis not present

## 2020-08-15 DIAGNOSIS — E119 Type 2 diabetes mellitus without complications: Secondary | ICD-10-CM | POA: Diagnosis not present

## 2020-08-17 ENCOUNTER — Other Ambulatory Visit: Payer: Self-pay | Admitting: Student

## 2020-08-17 ENCOUNTER — Other Ambulatory Visit: Payer: Self-pay | Admitting: *Deleted

## 2020-08-17 DIAGNOSIS — E0800 Diabetes mellitus due to underlying condition with hyperosmolarity without nonketotic hyperglycemic-hyperosmolar coma (NKHHC): Secondary | ICD-10-CM

## 2020-08-17 MED ORDER — METFORMIN HCL ER 500 MG PO TB24: 1000.0000 mg | ORAL_TABLET | Freq: Two times a day (BID) | ORAL | 5 refills | Status: DC

## 2020-08-17 NOTE — Telephone Encounter (Signed)
Received faxed request for refill on Metformin ER 500 mg take 2 tabs BID. This was d/c on admission to Hospital on 05/30/2020. It was not restarted on d/c. It is not on our current med list but is on [facility] med list as of today. Please advise and send new Rx if appropriate. Thank you.

## 2020-08-17 NOTE — Telephone Encounter (Signed)
I sent in his Metformin prescription. The hospital note recommended restart Metformin. Maybe it did not get updated in the med list. Thank you

## 2020-08-24 DIAGNOSIS — L89153 Pressure ulcer of sacral region, stage 3: Secondary | ICD-10-CM | POA: Diagnosis not present

## 2020-08-24 DIAGNOSIS — L89314 Pressure ulcer of right buttock, stage 4: Secondary | ICD-10-CM | POA: Diagnosis not present

## 2020-08-24 DIAGNOSIS — L89313 Pressure ulcer of right buttock, stage 3: Secondary | ICD-10-CM | POA: Diagnosis not present

## 2020-09-12 DIAGNOSIS — G8929 Other chronic pain: Secondary | ICD-10-CM | POA: Diagnosis not present

## 2020-09-12 DIAGNOSIS — Z79899 Other long term (current) drug therapy: Secondary | ICD-10-CM | POA: Diagnosis not present

## 2020-09-13 DIAGNOSIS — L89314 Pressure ulcer of right buttock, stage 4: Secondary | ICD-10-CM | POA: Diagnosis not present

## 2020-09-13 DIAGNOSIS — L89319 Pressure ulcer of right buttock, unspecified stage: Secondary | ICD-10-CM | POA: Diagnosis not present

## 2020-09-13 DIAGNOSIS — L89159 Pressure ulcer of sacral region, unspecified stage: Secondary | ICD-10-CM | POA: Diagnosis not present

## 2020-09-27 DIAGNOSIS — G839 Paralytic syndrome, unspecified: Secondary | ICD-10-CM | POA: Diagnosis not present

## 2020-09-27 DIAGNOSIS — L89159 Pressure ulcer of sacral region, unspecified stage: Secondary | ICD-10-CM | POA: Diagnosis not present

## 2020-09-27 DIAGNOSIS — E119 Type 2 diabetes mellitus without complications: Secondary | ICD-10-CM | POA: Diagnosis not present

## 2020-09-27 DIAGNOSIS — I10 Essential (primary) hypertension: Secondary | ICD-10-CM | POA: Diagnosis not present

## 2020-10-02 DIAGNOSIS — D649 Anemia, unspecified: Secondary | ICD-10-CM | POA: Diagnosis not present

## 2020-10-02 DIAGNOSIS — R627 Adult failure to thrive: Secondary | ICD-10-CM | POA: Diagnosis not present

## 2020-10-02 DIAGNOSIS — S24102S Unspecified injury at T2-T6 level of thoracic spinal cord, sequela: Secondary | ICD-10-CM | POA: Diagnosis not present

## 2020-10-02 DIAGNOSIS — M160 Bilateral primary osteoarthritis of hip: Secondary | ICD-10-CM | POA: Diagnosis not present

## 2020-10-02 DIAGNOSIS — L89154 Pressure ulcer of sacral region, stage 4: Secondary | ICD-10-CM | POA: Diagnosis not present

## 2020-10-02 DIAGNOSIS — E46 Unspecified protein-calorie malnutrition: Secondary | ICD-10-CM | POA: Diagnosis not present

## 2020-10-02 DIAGNOSIS — G8929 Other chronic pain: Secondary | ICD-10-CM | POA: Diagnosis not present

## 2020-10-02 DIAGNOSIS — Z7984 Long term (current) use of oral hypoglycemic drugs: Secondary | ICD-10-CM | POA: Diagnosis not present

## 2020-10-02 DIAGNOSIS — Z89611 Acquired absence of right leg above knee: Secondary | ICD-10-CM | POA: Diagnosis not present

## 2020-10-02 DIAGNOSIS — E785 Hyperlipidemia, unspecified: Secondary | ICD-10-CM | POA: Diagnosis not present

## 2020-10-02 DIAGNOSIS — Z7401 Bed confinement status: Secondary | ICD-10-CM | POA: Diagnosis not present

## 2020-10-02 DIAGNOSIS — K219 Gastro-esophageal reflux disease without esophagitis: Secondary | ICD-10-CM | POA: Diagnosis not present

## 2020-10-02 DIAGNOSIS — L89323 Pressure ulcer of left buttock, stage 3: Secondary | ICD-10-CM | POA: Diagnosis not present

## 2020-10-02 DIAGNOSIS — E1151 Type 2 diabetes mellitus with diabetic peripheral angiopathy without gangrene: Secondary | ICD-10-CM | POA: Diagnosis not present

## 2020-10-02 DIAGNOSIS — I10 Essential (primary) hypertension: Secondary | ICD-10-CM | POA: Diagnosis not present

## 2020-10-02 DIAGNOSIS — F17210 Nicotine dependence, cigarettes, uncomplicated: Secondary | ICD-10-CM | POA: Diagnosis not present

## 2020-10-02 DIAGNOSIS — Z79891 Long term (current) use of opiate analgesic: Secondary | ICD-10-CM | POA: Diagnosis not present

## 2020-10-02 DIAGNOSIS — L89313 Pressure ulcer of right buttock, stage 3: Secondary | ICD-10-CM | POA: Diagnosis not present

## 2020-10-02 DIAGNOSIS — Z435 Encounter for attention to cystostomy: Secondary | ICD-10-CM | POA: Diagnosis not present

## 2020-10-02 DIAGNOSIS — M62562 Muscle wasting and atrophy, not elsewhere classified, left lower leg: Secondary | ICD-10-CM | POA: Diagnosis not present

## 2020-10-02 DIAGNOSIS — G8220 Paraplegia, unspecified: Secondary | ICD-10-CM | POA: Diagnosis not present

## 2020-10-02 DIAGNOSIS — M62561 Muscle wasting and atrophy, not elsewhere classified, right lower leg: Secondary | ICD-10-CM | POA: Diagnosis not present

## 2020-10-02 DIAGNOSIS — M8580 Other specified disorders of bone density and structure, unspecified site: Secondary | ICD-10-CM | POA: Diagnosis not present

## 2020-10-02 DIAGNOSIS — Z466 Encounter for fitting and adjustment of urinary device: Secondary | ICD-10-CM | POA: Diagnosis not present

## 2020-10-11 ENCOUNTER — Encounter: Payer: Self-pay | Admitting: Student

## 2020-10-11 ENCOUNTER — Ambulatory Visit (INDEPENDENT_AMBULATORY_CARE_PROVIDER_SITE_OTHER): Payer: Medicare Other | Admitting: Student

## 2020-10-11 VITALS — BP 144/85 | HR 79 | Temp 97.9°F | Ht 73.5 in

## 2020-10-11 DIAGNOSIS — E0800 Diabetes mellitus due to underlying condition with hyperosmolarity without nonketotic hyperglycemic-hyperosmolar coma (NKHHC): Secondary | ICD-10-CM | POA: Diagnosis not present

## 2020-10-11 DIAGNOSIS — E43 Unspecified severe protein-calorie malnutrition: Secondary | ICD-10-CM

## 2020-10-11 DIAGNOSIS — Z89611 Acquired absence of right leg above knee: Secondary | ICD-10-CM

## 2020-10-11 DIAGNOSIS — G8220 Paraplegia, unspecified: Secondary | ICD-10-CM

## 2020-10-11 DIAGNOSIS — G546 Phantom limb syndrome with pain: Secondary | ICD-10-CM

## 2020-10-11 DIAGNOSIS — I10 Essential (primary) hypertension: Secondary | ICD-10-CM

## 2020-10-11 DIAGNOSIS — Z7401 Bed confinement status: Secondary | ICD-10-CM | POA: Diagnosis not present

## 2020-10-11 DIAGNOSIS — E11622 Type 2 diabetes mellitus with other skin ulcer: Secondary | ICD-10-CM | POA: Diagnosis not present

## 2020-10-11 DIAGNOSIS — L89310 Pressure ulcer of right buttock, unstageable: Secondary | ICD-10-CM | POA: Diagnosis not present

## 2020-10-11 DIAGNOSIS — Z794 Long term (current) use of insulin: Secondary | ICD-10-CM | POA: Diagnosis not present

## 2020-10-11 DIAGNOSIS — R69 Illness, unspecified: Secondary | ICD-10-CM | POA: Diagnosis not present

## 2020-10-11 DIAGNOSIS — R5381 Other malaise: Secondary | ICD-10-CM | POA: Diagnosis not present

## 2020-10-11 DIAGNOSIS — G894 Chronic pain syndrome: Secondary | ICD-10-CM

## 2020-10-11 DIAGNOSIS — E785 Hyperlipidemia, unspecified: Secondary | ICD-10-CM

## 2020-10-11 DIAGNOSIS — D649 Anemia, unspecified: Secondary | ICD-10-CM

## 2020-10-11 LAB — POCT GLYCOSYLATED HEMOGLOBIN (HGB A1C): Hemoglobin A1C: 5.3 % (ref 4.0–5.6)

## 2020-10-11 LAB — GLUCOSE, CAPILLARY: Glucose-Capillary: 94 mg/dL (ref 70–99)

## 2020-10-11 MED ORDER — THIAMINE HCL 100 MG PO TABS: 100.0000 mg | ORAL_TABLET | Freq: Every day | ORAL | 2 refills | Status: DC

## 2020-10-11 MED ORDER — AMLODIPINE BESYLATE 10 MG PO TABS: 10.0000 mg | ORAL_TABLET | Freq: Every day | ORAL | 2 refills | Status: DC

## 2020-10-11 MED ORDER — OMEPRAZOLE 40 MG PO CPDR: 40.0000 mg | DELAYED_RELEASE_CAPSULE | Freq: Every day | ORAL | 2 refills | Status: DC

## 2020-10-11 MED ORDER — EZETIMIBE 10 MG PO TABS: 10.0000 mg | ORAL_TABLET | Freq: Every day | ORAL | 2 refills | Status: DC

## 2020-10-11 MED ORDER — DAKINS (1/2 STRENGTH) 0.25 % EX SOLN: 1.0000 "application " | Freq: Once | CUTANEOUS | 1 refills | Status: AC

## 2020-10-11 MED ORDER — TRIAMCINOLONE ACETONIDE 0.1 % EX CREA: 1.0000 "application " | TOPICAL_CREAM | Freq: Two times a day (BID) | CUTANEOUS | 0 refills | Status: DC

## 2020-10-11 MED ORDER — MIRTAZAPINE 7.5 MG PO TABS: 7.5000 mg | ORAL_TABLET | Freq: Every day | ORAL | 1 refills | Status: DC

## 2020-10-11 MED ORDER — GABAPENTIN 300 MG PO CAPS: 600.0000 mg | ORAL_CAPSULE | Freq: Two times a day (BID) | ORAL | 1 refills | Status: DC

## 2020-10-11 MED ORDER — PRO-STAT 64 PO LIQD: 30.0000 mL | Freq: Every day | ORAL | 2 refills | Status: DC

## 2020-10-11 MED ORDER — ZINC SULFATE 220 (50 ZN) MG PO CAPS: 220.0000 mg | ORAL_CAPSULE | Freq: Every day | ORAL | 1 refills | Status: AC

## 2020-10-11 MED ORDER — METHOCARBAMOL 500 MG PO TABS: ORAL_TABLET | ORAL | 5 refills | Status: DC

## 2020-10-11 MED ORDER — METFORMIN HCL ER 500 MG PO TB24: 1000.0000 mg | ORAL_TABLET | Freq: Two times a day (BID) | ORAL | 5 refills | Status: DC

## 2020-10-11 NOTE — Assessment & Plan Note (Addendum)
Assessment: Patient with history of malnutrition, increased risk with worsening wounds. Concern for refeeding syndrome during last admission as patient had decrease in PO intake 2-3 weeks prior to admission. Patient continued feeding supplement pro-stat after admission.  Plan: Continue Pro-stat, encourage PO diet Continue mirtazapine 7.5 mg daily bedtime for appetite stimulation Renal function panel ordered

## 2020-10-11 NOTE — Assessment & Plan Note (Signed)
Assessment: Patient with chronic phantom limb pain. Since admission patient's pain regimen has been gabapentin and oxycodone. He notes having "pain doctor" Dr. Posey Pronto. Discussed with patient to discuss oxycodone with pain doctor and if they will not write for it to schedule an appointment and we will decide if he will start seeing our clinic for his pain regimen/care.    Plan: Continue gabapentin and oxycodone, written by Dr. Posey Pronto of PM&R

## 2020-10-11 NOTE — Assessment & Plan Note (Addendum)
Assessment: BP of 144/85 today. Continued on amlodipine 10 mg. HCTZ discontinued while in the hospital for his hyponatremia secondary to malnutrition.  Plan: Continue amlodipine 10 mg Consider additional medication if BP not improved after skin grafting Renal function panel pending

## 2020-10-11 NOTE — Assessment & Plan Note (Addendum)
Assessment: Patient recently released from nursing home facility after being admitted for worsening decubitus ulcers. Patient followed by wound care at [facility]. Planning for skin graft of wound later this month. During admission, patient received 2 units of blood thought to be due to anemia of chronic disease and blood loss from wounds  Plan: Split thickness skin graft procedure occurring later this month Continue following with [facility] Repeat CBC iron studies ferritin Continue home wound care, sodium hypochlorite solution ordered

## 2020-10-11 NOTE — Assessment & Plan Note (Signed)
Assessment: Last A1c of 6.2 four months ago. On metformin 1000mg  XR, Lantus 18 untis at bedtime   Plan: Repeat A1c today, continue current regimen

## 2020-10-11 NOTE — Progress Notes (Signed)
CC: Chronic Sacral Wounds   HPI:  [name] is a 69 y.o. male with a past medical history stated below and presents today for medication refills, chronic wounds, meet new PCP. Please see problem based assessment and plan for additional details.  Past Medical History:  Diagnosis Date  . Allergy   . Arthritis   . C. difficile colitis 11/2008  . Decubitus ulcer 1999   excision of right ischial pressure sore with flap reconstruction done by Dr. Denese Killings 10/31/2008  . 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 GERD (gastroesophageal reflux disease)   . Hyperlipidemia   . Hypertension   . Paraplegia ([place])    secondary to [place]  . Perineal abscess   . Substance abuse ([place])    alcohol    Current Outpatient Medications on File Prior to Visit  Medication Sig Dispense Refill  . diclofenac sodium (VOLTAREN) 1 % GEL APPLY TOPICALLY 4 TIMES DAILY. (Patient taking differently: Apply 2 g topically 4 (four) times daily as needed (pain). ) 100 g 1  . insulin glargine (LANTUS) 100 UNIT/ML injection Inject 0.18 mLs (18 Units total) into the skin at bedtime.    Marland Kitchen ketoconazole (NIZORAL) 2 % cream Apply topically 2 (two) times daily. 60 g 1  . Nutritional Supplements (ENSURE COMPACT) LIQD Take 237 mLs by mouth 3 (three) times daily. Ensure Premier---only 1gm of carbs per 8 0z     No current facility-administered medications on file prior to visit.    Family History  Problem Relation Age of Onset  . Stroke Mother   . Coronary artery disease Father   . Coronary artery disease Paternal Uncle   . Coronary artery disease Paternal Aunt     Social History   Socioeconomic History  . Marital status: Married    Spouse name: Not on file  . Number of children: 1  . Years of education: Not on file  . Highest education level: Not on file  Occupational History  . Occupation: DISABILITY    Employer: UNEMPLOYED  Tobacco Use  . Smoking status: Former Smoker    Packs/day: 0.50    Years: 40.00     Pack years: 20.00    Types: Cigarettes    Quit date: 07/09/2020    Years since quitting: 0.2  . Smokeless tobacco: Never Used  . Tobacco comment: 1 pack every 2 -3 days  Vaping Use  . Vaping Use: Never used  Substance and Sexual Activity  . Alcohol use: No    Alcohol/week: 0.0 standard drinks  . Drug use: No  . Sexual activity: Not on file  Other Topics Concern  . Not on file  Social History Narrative   Married, disabled, medicaid, functions independently at home, paraplegic      Family hx of : hypertension, diabetes, kidney disease, and other cancer   Social Determinants of Health   Financial Resource Strain:   . Difficulty of Paying Living Expenses: Not on file  Food Insecurity:   . Worried About Charity fundraiser in the Last Year: Not on file  . Ran Out of Food in the Last Year: Not on file  Transportation Needs:   . Lack of Transportation (Medical): Not on file  . Lack of Transportation (Non-Medical): Not on file  Physical Activity:   . Days of Exercise per Week: Not on file  . Minutes of Exercise per Session: Not on file  Stress:   . Feeling of Stress : Not on file  Social Connections:   . Frequency of Communication with Friends and Family: Not on file  . Frequency of Social Gatherings with Friends and Family: Not on file  . Attends Religious Services: Not on file  . Active Member of Clubs or Organizations: Not on file  . Attends Archivist Meetings: Not on file  . Marital Status: Not on file  Intimate Partner Violence:   . Fear of Current or Ex-Partner: Not on file  . Emotionally Abused: Not on file  . Physically Abused: Not on file  . Sexually Abused: Not on file    Review of Systems: ROS negative except for what is noted on the assessment and plan.  Vitals:   10/11/20 1328  BP: (!) 144/85  Pulse: 79  Temp: 97.9 F (36.6 C)  TempSrc: Oral  SpO2: 98%  Height: 6' 1.5" (1.867 m)    Physical Exam: Physical Exam Vitals and nursing note  reviewed.  Constitutional:      Appearance: Normal appearance.  Cardiovascular:     Rate and Rhythm: Normal rate and regular rhythm.     Pulses: Normal pulses.     Heart sounds: Normal heart sounds. No murmur heard.  No friction rub. No gallop.   Pulmonary:     Effort: Pulmonary effort is normal. No respiratory distress.     Breath sounds: Normal breath sounds. No stridor. No wheezing, rhonchi or rales.  Musculoskeletal:     Left lower leg: No edema.     Comments: Right AKA  Skin:    General: Skin is warm and dry.  Neurological:     General: No focal deficit present.     Mental Status: He is alert and oriented to person, place, and time. Mental status is at baseline.  Psychiatric:        Mood and Affect: Mood normal.        Behavior: Behavior normal.      Assessment & Plan:   See Encounters Tab for problem based charting.  Patient discussed with Dr. Delano Metz, D.O. [facility], PGY-2 Pager: [phone], Phone: [phone] Date 10/11/2020 Time 4:52 PM

## 2020-10-11 NOTE — Patient Instructions (Addendum)
Thank you, Shawn [name]. for allowing [place] to provide your care today. Today we discussed your medication refills.    I have ordered the following labs for you:   Lab Orders     Glucose, capillary     Renal function panel     CBC no Diff     Magnesium     Ferritin     Iron and IBC (CPT-83540,83550)     POC Hbg A1C    Referrals ordered today:   Referral Orders  No referral(s) requested today    Stop Taking Potassium Phosphate   I have ordered the following medication/changed the following medications:   Meds ordered this encounter  Medications  . mirtazapine (REMERON) 7.5 MG tablet    Sig: Take 1 tablet (7.5 mg total) by mouth at bedtime.    Dispense:  30 tablet    Refill:  1  . amLODipine (NORVASC) 10 MG tablet    Sig: Take 1 tablet (10 mg total) by mouth daily.    Dispense:  90 tablet    Refill:  2  . omeprazole (PRILOSEC) 40 MG capsule    Sig: Take 1 capsule (40 mg total) by mouth daily.    Dispense:  90 capsule    Refill:  2  . Amino Acids-Protein Hydrolys (FEEDING SUPPLEMENT, PRO-STAT 64,) LIQD    Sig: Take 30 mLs by mouth daily.    Dispense:  887 mL    Refill:  2  . thiamine 100 MG tablet    Sig: Take 1 tablet (100 mg total) by mouth daily.    Dispense:  90 tablet    Refill:  2  . ezetimibe (ZETIA) 10 MG tablet    Sig: Take 1 tablet (10 mg total) by mouth daily.    Dispense:  90 tablet    Refill:  2  . zinc sulfate 220 (50 Zn) MG capsule    Sig: Take 1 capsule (220 mg total) by mouth daily.    Dispense:  90 capsule    Refill:  1  . metFORMIN (GLUCOPHAGE XR) 500 MG 24 hr tablet    Sig: Take 2 tablets (1,000 mg total) by mouth in the morning and at bedtime.    Dispense:  120 tablet    Refill:  5  . gabapentin (NEURONTIN) 300 MG capsule    Sig: Take 2 capsules (600 mg total) by mouth 2 (two) times daily.    Dispense:  180 capsule    Refill:  1  . methocarbamol (ROBAXIN) 500 MG tablet    Sig: TAKE 1 TABLET BY MOUTH 2 TIMES DAILY AS NEEDED FOR  MUSCLE SPASMS.    Dispense:  60 tablet    Refill:  5  . sodium hypochlorite (DAKIN'S 1/2 STRENGTH) external solution    Sig: Irrigate with 1 application as directed once for 1 dose.    Dispense:  473 mL    Refill:  1  . triamcinolone cream (KENALOG) 0.1 %    Sig: Apply 1 application topically 2 (two) times daily.    Dispense:  30 g    Refill:  0     Follow up: 3 months    Should you have any questions or concerns please call the internal medicine clinic at [phone].     Sanjuana Letters, D.O. [place]

## 2020-10-11 NOTE — Assessment & Plan Note (Signed)
Assessment: Lab Results  Component Value Date   CHOL 128 05/19/2019   HDL 29 (L) 05/19/2019   LDLCALC 79 05/19/2019   TRIG 102 05/19/2019   CHOLHDL 4.4 05/19/2019   Last lipid panel from 2020. Stable on current regimen  Plan: Continue ezetimibe 10 mg daily

## 2020-10-12 ENCOUNTER — Other Ambulatory Visit: Payer: Self-pay | Admitting: Student

## 2020-10-12 DIAGNOSIS — D509 Iron deficiency anemia, unspecified: Secondary | ICD-10-CM | POA: Insufficient documentation

## 2020-10-12 DIAGNOSIS — R404 Transient alteration of awareness: Secondary | ICD-10-CM | POA: Diagnosis not present

## 2020-10-12 DIAGNOSIS — Z89611 Acquired absence of right leg above knee: Secondary | ICD-10-CM | POA: Diagnosis not present

## 2020-10-12 DIAGNOSIS — E1165 Type 2 diabetes mellitus with hyperglycemia: Secondary | ICD-10-CM | POA: Diagnosis not present

## 2020-10-12 DIAGNOSIS — Z743 Need for continuous supervision: Secondary | ICD-10-CM | POA: Diagnosis not present

## 2020-10-12 DIAGNOSIS — R0902 Hypoxemia: Secondary | ICD-10-CM | POA: Diagnosis not present

## 2020-10-12 DIAGNOSIS — R279 Unspecified lack of coordination: Secondary | ICD-10-CM | POA: Diagnosis not present

## 2020-10-12 DIAGNOSIS — I10 Essential (primary) hypertension: Secondary | ICD-10-CM | POA: Diagnosis not present

## 2020-10-12 DIAGNOSIS — L89314 Pressure ulcer of right buttock, stage 4: Secondary | ICD-10-CM | POA: Diagnosis not present

## 2020-10-12 LAB — CBC
Hematocrit: 32.9 % — ABNORMAL LOW (ref 37.5–51.0)
Hemoglobin: 10 g/dL — ABNORMAL LOW (ref 13.0–17.7)
MCH: 22 pg — ABNORMAL LOW (ref 26.6–33.0)
MCHC: 30.4 g/dL — ABNORMAL LOW (ref 31.5–35.7)
MCV: 73 fL — ABNORMAL LOW (ref 79–97)
Platelets: 537 10*3/uL — ABNORMAL HIGH (ref 150–450)
RBC: 4.54 x10E6/uL (ref 4.14–5.80)
RDW: 16.9 % — ABNORMAL HIGH (ref 11.6–15.4)
WBC: 6.9 10*3/uL (ref 3.4–10.8)

## 2020-10-12 LAB — FERRITIN: Ferritin: 21 ng/mL — ABNORMAL LOW (ref 30–400)

## 2020-10-12 LAB — RENAL FUNCTION PANEL
Albumin: 3.6 g/dL — ABNORMAL LOW (ref 3.8–4.8)
BUN/Creatinine Ratio: 26 — ABNORMAL HIGH (ref 10–24)
BUN: 13 mg/dL (ref 8–27)
CO2: 23 mmol/L (ref 20–29)
Calcium: 9 mg/dL (ref 8.6–10.2)
Chloride: 102 mmol/L (ref 96–106)
Creatinine, Ser: 0.5 mg/dL — ABNORMAL LOW (ref 0.76–1.27)
GFR calc Af Amer: 127 mL/min/{1.73_m2}
GFR calc non Af Amer: 110 mL/min/{1.73_m2}
Glucose: 93 mg/dL (ref 65–99)
Phosphorus: 3.9 mg/dL (ref 2.8–4.1)
Potassium: 4.4 mmol/L (ref 3.5–5.2)
Sodium: 137 mmol/L (ref 134–144)

## 2020-10-12 LAB — IRON AND TIBC
Iron Saturation: 4 % — CL (ref 15–55)
Iron: 13 ug/dL — ABNORMAL LOW (ref 38–169)
Total Iron Binding Capacity: 289 ug/dL (ref 250–450)
UIBC: 276 ug/dL (ref 111–343)

## 2020-10-12 LAB — MAGNESIUM: Magnesium: 1.9 mg/dL (ref 1.6–2.3)

## 2020-10-12 MED ORDER — FERROUS SULFATE 325 (65 FE) MG PO TBEC: 325.0000 mg | DELAYED_RELEASE_TABLET | ORAL | 3 refills | Status: DC

## 2020-10-12 NOTE — Assessment & Plan Note (Signed)
Assessment: Hgb of 10.0, iron of 13, iron saturation 4, TIBC 289. Ferritin 21. Patient appears to have microcytic anemia due to iron deficiency, also suspect component of anemia of chronic disease due to patient's chronic osteomyelitis. Suspect patient's severe malnutrition is contributing to iron deficiency.    Plan: Prescribed Ferrous Sulfate 325 (65 FE) to be taken every other day

## 2020-10-12 NOTE — Progress Notes (Addendum)
Patient informed of his iron deficiency anemia. Prescribed ferrous sulfate 325 (65 FE) to take every other day.

## 2020-10-13 NOTE — Progress Notes (Signed)
Internal Medicine Clinic Attending  I saw and evaluated the patient.  I personally confirmed the key portions of the history and exam documented by Dr. Katsadouros and I reviewed pertinent patient test results.  The assessment, diagnosis, and plan were formulated together and I agree with the documentation in the resident's note.  

## 2020-10-26 DIAGNOSIS — L89322 Pressure ulcer of left buttock, stage 2: Secondary | ICD-10-CM | POA: Diagnosis not present

## 2020-10-26 DIAGNOSIS — L89314 Pressure ulcer of right buttock, stage 4: Secondary | ICD-10-CM | POA: Diagnosis not present

## 2020-10-26 DIAGNOSIS — S31809A Unspecified open wound of unspecified buttock, initial encounter: Secondary | ICD-10-CM | POA: Diagnosis not present

## 2020-10-29 ENCOUNTER — Other Ambulatory Visit: Payer: Self-pay | Admitting: Student

## 2020-11-01 DIAGNOSIS — F17210 Nicotine dependence, cigarettes, uncomplicated: Secondary | ICD-10-CM | POA: Diagnosis not present

## 2020-11-01 DIAGNOSIS — L89154 Pressure ulcer of sacral region, stage 4: Secondary | ICD-10-CM | POA: Diagnosis not present

## 2020-11-01 DIAGNOSIS — M8580 Other specified disorders of bone density and structure, unspecified site: Secondary | ICD-10-CM | POA: Diagnosis not present

## 2020-11-01 DIAGNOSIS — M62561 Muscle wasting and atrophy, not elsewhere classified, right lower leg: Secondary | ICD-10-CM | POA: Diagnosis not present

## 2020-11-01 DIAGNOSIS — G8220 Paraplegia, unspecified: Secondary | ICD-10-CM | POA: Diagnosis not present

## 2020-11-01 DIAGNOSIS — Z435 Encounter for attention to cystostomy: Secondary | ICD-10-CM | POA: Diagnosis not present

## 2020-11-01 DIAGNOSIS — E46 Unspecified protein-calorie malnutrition: Secondary | ICD-10-CM | POA: Diagnosis not present

## 2020-11-01 DIAGNOSIS — I10 Essential (primary) hypertension: Secondary | ICD-10-CM | POA: Diagnosis not present

## 2020-11-01 DIAGNOSIS — Z89611 Acquired absence of right leg above knee: Secondary | ICD-10-CM | POA: Diagnosis not present

## 2020-11-01 DIAGNOSIS — L89323 Pressure ulcer of left buttock, stage 3: Secondary | ICD-10-CM | POA: Diagnosis not present

## 2020-11-01 DIAGNOSIS — Z79891 Long term (current) use of opiate analgesic: Secondary | ICD-10-CM | POA: Diagnosis not present

## 2020-11-01 DIAGNOSIS — D649 Anemia, unspecified: Secondary | ICD-10-CM | POA: Diagnosis not present

## 2020-11-01 DIAGNOSIS — E1151 Type 2 diabetes mellitus with diabetic peripheral angiopathy without gangrene: Secondary | ICD-10-CM | POA: Diagnosis not present

## 2020-11-01 DIAGNOSIS — E785 Hyperlipidemia, unspecified: Secondary | ICD-10-CM | POA: Diagnosis not present

## 2020-11-01 DIAGNOSIS — R627 Adult failure to thrive: Secondary | ICD-10-CM | POA: Diagnosis not present

## 2020-11-01 DIAGNOSIS — Z7401 Bed confinement status: Secondary | ICD-10-CM | POA: Diagnosis not present

## 2020-11-01 DIAGNOSIS — Z466 Encounter for fitting and adjustment of urinary device: Secondary | ICD-10-CM | POA: Diagnosis not present

## 2020-11-01 DIAGNOSIS — G8929 Other chronic pain: Secondary | ICD-10-CM | POA: Diagnosis not present

## 2020-11-01 DIAGNOSIS — L89314 Pressure ulcer of right buttock, stage 4: Secondary | ICD-10-CM | POA: Diagnosis not present

## 2020-11-01 DIAGNOSIS — M160 Bilateral primary osteoarthritis of hip: Secondary | ICD-10-CM | POA: Diagnosis not present

## 2020-11-01 DIAGNOSIS — M62562 Muscle wasting and atrophy, not elsewhere classified, left lower leg: Secondary | ICD-10-CM | POA: Diagnosis not present

## 2020-11-01 DIAGNOSIS — S24102S Unspecified injury at T2-T6 level of thoracic spinal cord, sequela: Secondary | ICD-10-CM | POA: Diagnosis not present

## 2020-11-01 DIAGNOSIS — E139 Other specified diabetes mellitus without complications: Secondary | ICD-10-CM | POA: Diagnosis not present

## 2020-11-01 DIAGNOSIS — K219 Gastro-esophageal reflux disease without esophagitis: Secondary | ICD-10-CM | POA: Diagnosis not present

## 2020-11-01 DIAGNOSIS — Z7984 Long term (current) use of oral hypoglycemic drugs: Secondary | ICD-10-CM | POA: Diagnosis not present

## 2020-11-01 DIAGNOSIS — L89313 Pressure ulcer of right buttock, stage 3: Secondary | ICD-10-CM | POA: Diagnosis not present

## 2020-11-01 DIAGNOSIS — Z794 Long term (current) use of insulin: Secondary | ICD-10-CM | POA: Diagnosis not present

## 2020-11-03 DIAGNOSIS — L89313 Pressure ulcer of right buttock, stage 3: Secondary | ICD-10-CM | POA: Diagnosis not present

## 2020-11-03 DIAGNOSIS — S24102S Unspecified injury at T2-T6 level of thoracic spinal cord, sequela: Secondary | ICD-10-CM | POA: Diagnosis not present

## 2020-11-03 DIAGNOSIS — L89323 Pressure ulcer of left buttock, stage 3: Secondary | ICD-10-CM | POA: Diagnosis not present

## 2020-11-03 DIAGNOSIS — L89154 Pressure ulcer of sacral region, stage 4: Secondary | ICD-10-CM | POA: Diagnosis not present

## 2020-11-03 DIAGNOSIS — E1151 Type 2 diabetes mellitus with diabetic peripheral angiopathy without gangrene: Secondary | ICD-10-CM | POA: Diagnosis not present

## 2020-11-03 DIAGNOSIS — G8220 Paraplegia, unspecified: Secondary | ICD-10-CM | POA: Diagnosis not present

## 2020-11-04 ENCOUNTER — Other Ambulatory Visit: Payer: Self-pay | Admitting: Student

## 2020-11-04 DIAGNOSIS — E43 Unspecified severe protein-calorie malnutrition: Secondary | ICD-10-CM

## 2020-11-06 ENCOUNTER — Telehealth (INDEPENDENT_AMBULATORY_CARE_PROVIDER_SITE_OTHER): Payer: Medicare Other | Admitting: Internal Medicine

## 2020-11-06 ENCOUNTER — Other Ambulatory Visit: Payer: Self-pay

## 2020-11-06 ENCOUNTER — Telehealth: Payer: Self-pay | Admitting: *Deleted

## 2020-11-06 DIAGNOSIS — L2089 Other atopic dermatitis: Secondary | ICD-10-CM | POA: Diagnosis not present

## 2020-11-06 DIAGNOSIS — E1151 Type 2 diabetes mellitus with diabetic peripheral angiopathy without gangrene: Secondary | ICD-10-CM | POA: Diagnosis not present

## 2020-11-06 DIAGNOSIS — L89154 Pressure ulcer of sacral region, stage 4: Secondary | ICD-10-CM | POA: Diagnosis not present

## 2020-11-06 DIAGNOSIS — L89313 Pressure ulcer of right buttock, stage 3: Secondary | ICD-10-CM | POA: Diagnosis not present

## 2020-11-06 DIAGNOSIS — G8220 Paraplegia, unspecified: Secondary | ICD-10-CM | POA: Diagnosis not present

## 2020-11-06 DIAGNOSIS — L89323 Pressure ulcer of left buttock, stage 3: Secondary | ICD-10-CM | POA: Diagnosis not present

## 2020-11-06 DIAGNOSIS — S24102S Unspecified injury at T2-T6 level of thoracic spinal cord, sequela: Secondary | ICD-10-CM | POA: Diagnosis not present

## 2020-11-06 MED ORDER — TRIAMCINOLONE ACETONIDE 0.1 % EX CREA
TOPICAL_CREAM | Freq: Three times a day (TID) | CUTANEOUS | 0 refills | Status: DC
Start: 2020-11-06 — End: 2021-07-09

## 2020-11-06 NOTE — Progress Notes (Signed)
  [facility] Internal Medicine Residency Telephone Encounter Continuity Care Appointment  HPI:   This telephone encounter was created for [name]. on 11/06/2020 for the following purpose/cc rash.  This is a 69 year old male with a history of HTN, HLD, paraplegia, decubitus ulcer s/p skin graft on 10/26/20 presenting for evaluation of rash on upper body area.   Patient reports that this has been going on for approximately 2 months, states it has been on and off, initially it was mostly itchiness however now it has developed into a rash. States that it has been worsening since 1-2 weeks ago. Is on both arms and spread to his shoulders and neck, it is very itchy. Describes it as a red/purplish color. Right arm is worse then left. He never had this before. He has tried benadryl and gold bond cream for itching, reports that it helped a little bit. He had his refill of his triamcinolone cream recently however they state that he is not using any steroid cream or Kenalog cream. His only medication that had recently been changed with starting an iron pill about 3 weeks ago.  He has had no other changes to his medication.  Denies any new exposures, no changes in his laundry detergents, soaps, diet, animal exposure, carpet exposure, or other new exposures.  He recently had a skin graft on 11/18 however patient states that the rash was present prior to the graft.  He denies fevers, chills, nausea, vomiting, difficulty breathing, SOB, diarrhea, chest pain. Reports some acid reflux.    Past Medical History:  Past Medical History:  Diagnosis Date  . Allergy   . Arthritis   . C. difficile colitis 11/2008  . Decubitus ulcer 1999   excision of right ischial pressure sore with flap reconstruction done by Dr. Denese Killings 10/31/2008  . 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 GERD (gastroesophageal reflux disease)   . Hyperlipidemia   . Hypertension   . Paraplegia ([place])    secondary to [place]  . Perineal  abscess   . Substance abuse ([place])    alcohol      ROS:  Constitutional: Negative for chills and fever.  Respiratory: Negative for shortness of breath.   Cardiovascular: Negative for chest pain and leg swelling.   Gastrointestinal: Negative for abdominal pain, nausea and vomiting.   Dermatology: Positive for red, raised rash, and itchiness.   Neurological: Negative for dizziness and headaches.    Assessment / Plan / Recommendations:   Please see A&P under problem oriented charting for assessment of the patient's acute and chronic medical conditions.   As always, pt is advised that if symptoms worsen or new symptoms arise, they should go to an urgent care facility or to to ER for further evaluation.   Consent and Medical Decision Making:   Patient discussed with Dr. Evette Doffing  This is a telephone encounter between [place] on 11/06/2020 for rash on arms. The visit was conducted with the patient located at home and Asencion Noble at [facility]. The patient's identity was confirmed using their DOB and current address. The patient, patient's son and wife has consented to being evaluated through a telephone encounter and understands the associated risks (an examination cannot be done and the patient may need to come in for an appointment) / benefits (allows the patient to remain at home, decreasing exposure to coronavirus). I personally spent 20 minutes on medical discussion.

## 2020-11-06 NOTE — Telephone Encounter (Signed)
Kristi, RN with [facility] called in to report patient has a 7 day hx of raised, itchy rash on his entire upper body. Worse on both arms than trunk. States he received a skin graft to bottom about 2 weeks ago but was not given any new meds oror new products. Patient has a difficult time coming to clinic as he needs to be transported by EMS. Will transfer to FO to schedule virtual appt. Hubbard Hartshorn, BSN, RN-BC

## 2020-11-06 NOTE — Telephone Encounter (Signed)
Patient's wife notified that virtual visit has been scheduled for today at 3:45. Please call wife's cell [phone]. Hubbard Hartshorn, BSN, RN-BC

## 2020-11-07 ENCOUNTER — Other Ambulatory Visit: Payer: Self-pay | Admitting: Dietician

## 2020-11-07 DIAGNOSIS — L89154 Pressure ulcer of sacral region, stage 4: Secondary | ICD-10-CM | POA: Diagnosis not present

## 2020-11-07 DIAGNOSIS — L209 Atopic dermatitis, unspecified: Secondary | ICD-10-CM | POA: Insufficient documentation

## 2020-11-07 DIAGNOSIS — L89313 Pressure ulcer of right buttock, stage 3: Secondary | ICD-10-CM | POA: Diagnosis not present

## 2020-11-07 DIAGNOSIS — G8220 Paraplegia, unspecified: Secondary | ICD-10-CM | POA: Diagnosis not present

## 2020-11-07 DIAGNOSIS — E0800 Diabetes mellitus due to underlying condition with hyperosmolarity without nonketotic hyperglycemic-hyperosmolar coma (NKHHC): Secondary | ICD-10-CM

## 2020-11-07 DIAGNOSIS — S24102S Unspecified injury at T2-T6 level of thoracic spinal cord, sequela: Secondary | ICD-10-CM | POA: Diagnosis not present

## 2020-11-07 DIAGNOSIS — L89323 Pressure ulcer of left buttock, stage 3: Secondary | ICD-10-CM | POA: Diagnosis not present

## 2020-11-07 DIAGNOSIS — E1151 Type 2 diabetes mellitus with diabetic peripheral angiopathy without gangrene: Secondary | ICD-10-CM | POA: Diagnosis not present

## 2020-11-07 MED ORDER — ACCU-CHEK GUIDE VI STRP: ORAL_STRIP | 12 refills | Status: DC

## 2020-11-07 MED ORDER — ACCU-CHEK SOFTCLIX LANCETS MISC: 12 refills | Status: DC

## 2020-11-07 NOTE — Assessment & Plan Note (Signed)
This is a 69 year old male with a history of HTN, HLD, paraplegia, decubitus ulcer s/p skin graft on 10/26/20 presenting for evaluation of rash on upper body area.  Patient reports that this has been going on for approximately 2 months, states it has been on and off, initially it was mostly itchiness however now it has developed into a rash. States that it has been worsening since 1-2 weeks ago. Is on both arms and spread to his shoulders and neck, it is very itchy. Describes it as a red/purplish color. Right arm is worse then left. He never had this before. He has tried benadryl and gold bond cream for itching, reports that it helped a little bit. He had his refill of his triamcinolone cream recently however they state that he is not using any steroid cream or Kenalog cream. His only medication that had recently been changed with starting an iron pill about 3 weeks ago.  He has had no other changes to his medication.  Denies any new exposures, no changes in his laundry detergents, soaps, diet, animal exposure, carpet exposure, or other new exposures.  He recently had a skin graft on 11/18 however patient states that the rash was present prior to the graft.  He denies fevers, chills, nausea, vomiting, difficulty breathing, SOB, diarrhea, chest pain. Reports some acid reflux.  Exam limited by video quality, noted well demarcated, red, plaques on upper extremities, up to mid/upper arm, with smaller well demarcated plaques on neck.   Overall cause of his rash is unclear due to limited exam. Patient and family reported that he would not be able to come in to be evaluated at this time. Since rash was present prior to skin graft and prior to his new medication a reaction to this appears less likely. Does appears to be mostly around elbow creases with some localized area on neck. This could be consistent with atopic dermatitis.  -Will treat empirically with steroid cream.  -Advised that if symptoms do not improve or  worsen then he will need in person evaluation.

## 2020-11-07 NOTE — Progress Notes (Signed)
Internal Medicine Clinic Attending  Case discussed with Dr. Sherry Ruffing  At the time of the visit.  We reviewed the resident's history and pertinent patient test results.  I agree with the assessment, diagnosis, and plan of care documented in the resident's note.

## 2020-11-07 NOTE — Addendum Note (Signed)
Addended by: Lalla Brothers T on: 11/07/2020 11:17 AM   Modules accepted: Level of Service

## 2020-11-07 NOTE — Telephone Encounter (Signed)
Patient's wife calls for glucose testing supplies for sample ,eter provided to them at his last visit.

## 2020-11-08 DIAGNOSIS — Z89611 Acquired absence of right leg above knee: Secondary | ICD-10-CM | POA: Diagnosis not present

## 2020-11-08 DIAGNOSIS — L89313 Pressure ulcer of right buttock, stage 3: Secondary | ICD-10-CM | POA: Diagnosis not present

## 2020-11-08 DIAGNOSIS — L89153 Pressure ulcer of sacral region, stage 3: Secondary | ICD-10-CM | POA: Diagnosis not present

## 2020-11-08 DIAGNOSIS — G8220 Paraplegia, unspecified: Secondary | ICD-10-CM | POA: Diagnosis not present

## 2020-11-08 DIAGNOSIS — E46 Unspecified protein-calorie malnutrition: Secondary | ICD-10-CM | POA: Diagnosis not present

## 2020-11-08 DIAGNOSIS — I10 Essential (primary) hypertension: Secondary | ICD-10-CM | POA: Diagnosis not present

## 2020-11-08 DIAGNOSIS — L89314 Pressure ulcer of right buttock, stage 4: Secondary | ICD-10-CM | POA: Diagnosis not present

## 2020-11-09 DIAGNOSIS — L89313 Pressure ulcer of right buttock, stage 3: Secondary | ICD-10-CM | POA: Diagnosis not present

## 2020-11-09 DIAGNOSIS — S24102S Unspecified injury at T2-T6 level of thoracic spinal cord, sequela: Secondary | ICD-10-CM | POA: Diagnosis not present

## 2020-11-09 DIAGNOSIS — L89323 Pressure ulcer of left buttock, stage 3: Secondary | ICD-10-CM | POA: Diagnosis not present

## 2020-11-09 DIAGNOSIS — G8220 Paraplegia, unspecified: Secondary | ICD-10-CM | POA: Diagnosis not present

## 2020-11-09 DIAGNOSIS — L89154 Pressure ulcer of sacral region, stage 4: Secondary | ICD-10-CM | POA: Diagnosis not present

## 2020-11-09 DIAGNOSIS — E1151 Type 2 diabetes mellitus with diabetic peripheral angiopathy without gangrene: Secondary | ICD-10-CM | POA: Diagnosis not present

## 2020-11-14 DIAGNOSIS — L89313 Pressure ulcer of right buttock, stage 3: Secondary | ICD-10-CM | POA: Diagnosis not present

## 2020-11-14 DIAGNOSIS — E1151 Type 2 diabetes mellitus with diabetic peripheral angiopathy without gangrene: Secondary | ICD-10-CM | POA: Diagnosis not present

## 2020-11-14 DIAGNOSIS — S24102S Unspecified injury at T2-T6 level of thoracic spinal cord, sequela: Secondary | ICD-10-CM | POA: Diagnosis not present

## 2020-11-14 DIAGNOSIS — G8220 Paraplegia, unspecified: Secondary | ICD-10-CM | POA: Diagnosis not present

## 2020-11-14 DIAGNOSIS — L89323 Pressure ulcer of left buttock, stage 3: Secondary | ICD-10-CM | POA: Diagnosis not present

## 2020-11-14 DIAGNOSIS — L89154 Pressure ulcer of sacral region, stage 4: Secondary | ICD-10-CM | POA: Diagnosis not present

## 2020-11-17 DIAGNOSIS — E1151 Type 2 diabetes mellitus with diabetic peripheral angiopathy without gangrene: Secondary | ICD-10-CM | POA: Diagnosis not present

## 2020-11-17 DIAGNOSIS — L89313 Pressure ulcer of right buttock, stage 3: Secondary | ICD-10-CM | POA: Diagnosis not present

## 2020-11-17 DIAGNOSIS — G8220 Paraplegia, unspecified: Secondary | ICD-10-CM | POA: Diagnosis not present

## 2020-11-17 DIAGNOSIS — L89323 Pressure ulcer of left buttock, stage 3: Secondary | ICD-10-CM | POA: Diagnosis not present

## 2020-11-17 DIAGNOSIS — S24102S Unspecified injury at T2-T6 level of thoracic spinal cord, sequela: Secondary | ICD-10-CM | POA: Diagnosis not present

## 2020-11-17 DIAGNOSIS — L89154 Pressure ulcer of sacral region, stage 4: Secondary | ICD-10-CM | POA: Diagnosis not present

## 2020-11-19 ENCOUNTER — Other Ambulatory Visit: Payer: Self-pay | Admitting: Student

## 2020-11-19 DIAGNOSIS — Z89611 Acquired absence of right leg above knee: Secondary | ICD-10-CM

## 2020-11-20 DIAGNOSIS — E1151 Type 2 diabetes mellitus with diabetic peripheral angiopathy without gangrene: Secondary | ICD-10-CM | POA: Diagnosis not present

## 2020-11-20 DIAGNOSIS — L89154 Pressure ulcer of sacral region, stage 4: Secondary | ICD-10-CM | POA: Diagnosis not present

## 2020-11-20 DIAGNOSIS — L89323 Pressure ulcer of left buttock, stage 3: Secondary | ICD-10-CM | POA: Diagnosis not present

## 2020-11-20 DIAGNOSIS — G8220 Paraplegia, unspecified: Secondary | ICD-10-CM | POA: Diagnosis not present

## 2020-11-20 DIAGNOSIS — L89313 Pressure ulcer of right buttock, stage 3: Secondary | ICD-10-CM | POA: Diagnosis not present

## 2020-11-20 DIAGNOSIS — S24102S Unspecified injury at T2-T6 level of thoracic spinal cord, sequela: Secondary | ICD-10-CM | POA: Diagnosis not present

## 2020-11-23 ENCOUNTER — Telehealth: Payer: Self-pay

## 2020-11-23 ENCOUNTER — Other Ambulatory Visit: Payer: Self-pay

## 2020-11-23 ENCOUNTER — Inpatient Hospital Stay (HOSPITAL_COMMUNITY)
Admission: EM | Admit: 2020-11-23 | Discharge: 2020-11-29 | DRG: 871 | Disposition: A | Discharge status: Home Health | Payer: Medicare Other | Attending: Internal Medicine | Admitting: Internal Medicine

## 2020-11-23 DIAGNOSIS — N319 Neuromuscular dysfunction of bladder, unspecified: Secondary | ICD-10-CM | POA: Diagnosis present

## 2020-11-23 DIAGNOSIS — N390 Urinary tract infection, site not specified: Secondary | ICD-10-CM | POA: Diagnosis present

## 2020-11-23 DIAGNOSIS — R220 Localized swelling, mass and lump, head: Secondary | ICD-10-CM | POA: Diagnosis not present

## 2020-11-23 DIAGNOSIS — N3001 Acute cystitis with hematuria: Secondary | ICD-10-CM | POA: Diagnosis present

## 2020-11-23 DIAGNOSIS — L89154 Pressure ulcer of sacral region, stage 4: Secondary | ICD-10-CM | POA: Diagnosis not present

## 2020-11-23 DIAGNOSIS — Z87891 Personal history of nicotine dependence: Secondary | ICD-10-CM

## 2020-11-23 DIAGNOSIS — L89219 Pressure ulcer of right hip, unspecified stage: Secondary | ICD-10-CM | POA: Diagnosis not present

## 2020-11-23 DIAGNOSIS — Z7984 Long term (current) use of oral hypoglycemic drugs: Secondary | ICD-10-CM | POA: Diagnosis not present

## 2020-11-23 DIAGNOSIS — L89899 Pressure ulcer of other site, unspecified stage: Secondary | ICD-10-CM | POA: Diagnosis not present

## 2020-11-23 DIAGNOSIS — M8619 Other acute osteomyelitis, multiple sites: Secondary | ICD-10-CM | POA: Diagnosis not present

## 2020-11-23 DIAGNOSIS — M8668 Other chronic osteomyelitis, other site: Secondary | ICD-10-CM | POA: Diagnosis not present

## 2020-11-23 DIAGNOSIS — T7840XA Allergy, unspecified, initial encounter: Secondary | ICD-10-CM | POA: Diagnosis not present

## 2020-11-23 DIAGNOSIS — R310 Gross hematuria: Secondary | ICD-10-CM | POA: Diagnosis not present

## 2020-11-23 DIAGNOSIS — L89323 Pressure ulcer of left buttock, stage 3: Secondary | ICD-10-CM | POA: Diagnosis not present

## 2020-11-23 DIAGNOSIS — B9629 Other Escherichia coli [E. coli] as the cause of diseases classified elsewhere: Secondary | ICD-10-CM | POA: Diagnosis not present

## 2020-11-23 DIAGNOSIS — N179 Acute kidney failure, unspecified: Secondary | ICD-10-CM | POA: Diagnosis present

## 2020-11-23 DIAGNOSIS — R7881 Bacteremia: Secondary | ICD-10-CM | POA: Diagnosis not present

## 2020-11-23 DIAGNOSIS — I10 Essential (primary) hypertension: Secondary | ICD-10-CM | POA: Diagnosis present

## 2020-11-23 DIAGNOSIS — J189 Pneumonia, unspecified organism: Secondary | ICD-10-CM | POA: Diagnosis not present

## 2020-11-23 DIAGNOSIS — M8610 Other acute osteomyelitis, unspecified site: Secondary | ICD-10-CM

## 2020-11-23 DIAGNOSIS — E119 Type 2 diabetes mellitus without complications: Secondary | ICD-10-CM | POA: Diagnosis not present

## 2020-11-23 DIAGNOSIS — B379 Candidiasis, unspecified: Secondary | ICD-10-CM | POA: Diagnosis present

## 2020-11-23 DIAGNOSIS — Z20822 Contact with and (suspected) exposure to covid-19: Secondary | ICD-10-CM | POA: Diagnosis present

## 2020-11-23 DIAGNOSIS — Z794 Long term (current) use of insulin: Secondary | ICD-10-CM | POA: Diagnosis not present

## 2020-11-23 DIAGNOSIS — B9620 Unspecified Escherichia coli [E. coli] as the cause of diseases classified elsewhere: Secondary | ICD-10-CM | POA: Diagnosis not present

## 2020-11-23 DIAGNOSIS — E1151 Type 2 diabetes mellitus with diabetic peripheral angiopathy without gangrene: Secondary | ICD-10-CM | POA: Diagnosis not present

## 2020-11-23 DIAGNOSIS — L89153 Pressure ulcer of sacral region, stage 3: Secondary | ICD-10-CM | POA: Diagnosis present

## 2020-11-23 DIAGNOSIS — Z1612 Extended spectrum beta lactamase (ESBL) resistance: Secondary | ICD-10-CM | POA: Diagnosis present

## 2020-11-23 DIAGNOSIS — L209 Atopic dermatitis, unspecified: Secondary | ICD-10-CM | POA: Diagnosis present

## 2020-11-23 DIAGNOSIS — E1169 Type 2 diabetes mellitus with other specified complication: Secondary | ICD-10-CM | POA: Diagnosis present

## 2020-11-23 DIAGNOSIS — D62 Acute posthemorrhagic anemia: Secondary | ICD-10-CM | POA: Diagnosis present

## 2020-11-23 DIAGNOSIS — G8220 Paraplegia, unspecified: Secondary | ICD-10-CM | POA: Diagnosis present

## 2020-11-23 DIAGNOSIS — Z79899 Other long term (current) drug therapy: Secondary | ICD-10-CM

## 2020-11-23 DIAGNOSIS — Z880 Allergy status to penicillin: Secondary | ICD-10-CM

## 2020-11-23 DIAGNOSIS — I959 Hypotension, unspecified: Secondary | ICD-10-CM | POA: Diagnosis not present

## 2020-11-23 DIAGNOSIS — M86659 Other chronic osteomyelitis, unspecified thigh: Secondary | ICD-10-CM

## 2020-11-23 DIAGNOSIS — L89313 Pressure ulcer of right buttock, stage 3: Secondary | ICD-10-CM | POA: Diagnosis not present

## 2020-11-23 DIAGNOSIS — E0800 Diabetes mellitus due to underlying condition with hyperosmolarity without nonketotic hyperglycemic-hyperosmolar coma (NKHHC): Secondary | ICD-10-CM

## 2020-11-23 DIAGNOSIS — E1159 Type 2 diabetes mellitus with other circulatory complications: Secondary | ICD-10-CM | POA: Diagnosis not present

## 2020-11-23 DIAGNOSIS — L89159 Pressure ulcer of sacral region, unspecified stage: Secondary | ICD-10-CM | POA: Diagnosis not present

## 2020-11-23 DIAGNOSIS — E785 Hyperlipidemia, unspecified: Secondary | ICD-10-CM | POA: Diagnosis present

## 2020-11-23 DIAGNOSIS — R21 Rash and other nonspecific skin eruption: Secondary | ICD-10-CM | POA: Diagnosis not present

## 2020-11-23 DIAGNOSIS — E876 Hypokalemia: Secondary | ICD-10-CM | POA: Diagnosis present

## 2020-11-23 DIAGNOSIS — Z7401 Bed confinement status: Secondary | ICD-10-CM | POA: Diagnosis not present

## 2020-11-23 DIAGNOSIS — L89319 Pressure ulcer of right buttock, unspecified stage: Secondary | ICD-10-CM | POA: Diagnosis present

## 2020-11-23 DIAGNOSIS — D509 Iron deficiency anemia, unspecified: Secondary | ICD-10-CM | POA: Diagnosis present

## 2020-11-23 DIAGNOSIS — A499 Bacterial infection, unspecified: Secondary | ICD-10-CM | POA: Diagnosis not present

## 2020-11-23 DIAGNOSIS — L304 Erythema intertrigo: Secondary | ICD-10-CM | POA: Diagnosis present

## 2020-11-23 DIAGNOSIS — A419 Sepsis, unspecified organism: Secondary | ICD-10-CM | POA: Diagnosis not present

## 2020-11-23 DIAGNOSIS — B9562 Methicillin resistant Staphylococcus aureus infection as the cause of diseases classified elsewhere: Secondary | ICD-10-CM | POA: Diagnosis present

## 2020-11-23 DIAGNOSIS — M533 Sacrococcygeal disorders, not elsewhere classified: Secondary | ICD-10-CM | POA: Diagnosis not present

## 2020-11-23 DIAGNOSIS — Z888 Allergy status to other drugs, medicaments and biological substances status: Secondary | ICD-10-CM

## 2020-11-23 DIAGNOSIS — Z8249 Family history of ischemic heart disease and other diseases of the circulatory system: Secondary | ICD-10-CM

## 2020-11-23 DIAGNOSIS — M868X0 Other osteomyelitis, multiple sites: Secondary | ICD-10-CM | POA: Diagnosis not present

## 2020-11-23 DIAGNOSIS — Z89611 Acquired absence of right leg above knee: Secondary | ICD-10-CM | POA: Diagnosis not present

## 2020-11-23 DIAGNOSIS — J90 Pleural effusion, not elsewhere classified: Secondary | ICD-10-CM | POA: Diagnosis not present

## 2020-11-23 DIAGNOSIS — Z823 Family history of stroke: Secondary | ICD-10-CM

## 2020-11-23 DIAGNOSIS — A4151 Sepsis due to Escherichia coli [E. coli]: Principal | ICD-10-CM | POA: Diagnosis present

## 2020-11-23 DIAGNOSIS — B3789 Other sites of candidiasis: Secondary | ICD-10-CM | POA: Diagnosis not present

## 2020-11-23 DIAGNOSIS — M86651 Other chronic osteomyelitis, right thigh: Secondary | ICD-10-CM | POA: Diagnosis not present

## 2020-11-23 DIAGNOSIS — L853 Xerosis cutis: Secondary | ICD-10-CM | POA: Diagnosis not present

## 2020-11-23 DIAGNOSIS — L8990 Pressure ulcer of unspecified site, unspecified stage: Secondary | ICD-10-CM | POA: Insufficient documentation

## 2020-11-23 DIAGNOSIS — R000 Tachycardia, unspecified: Secondary | ICD-10-CM | POA: Diagnosis not present

## 2020-11-23 DIAGNOSIS — S24102S Unspecified injury at T2-T6 level of thoracic spinal cord, sequela: Secondary | ICD-10-CM | POA: Diagnosis not present

## 2020-11-23 DIAGNOSIS — R609 Edema, unspecified: Secondary | ICD-10-CM | POA: Diagnosis not present

## 2020-11-23 NOTE — ED Notes (Signed)
Wife called having concerns about her diabetic husband and would like a call back on his status. [phone]

## 2020-11-23 NOTE — ED Triage Notes (Signed)
Ems was called by home health care for blood in urine when she changed the foley today.

## 2020-11-23 NOTE — Telephone Encounter (Signed)
Pt wife called stated the EMS is taking him to [facility] ER

## 2020-11-23 NOTE — Telephone Encounter (Signed)
Received TC from Janett Billow, a nurse with [facility] who is at the patient's home.  She states she believes the patient is having an allergic reaction.  States his lips are very swollen.  She reports on her arrival only his bottom lip was swollen, but since she has been in the home his top lip is now swelling.  She also reports dark, red blood draining from his suprapubic catheter.  Instructed Janett Billow to call 911 and have patient taken to ED for evaluation. SChaplin, RN,BSN

## 2020-11-23 NOTE — Telephone Encounter (Signed)
Perfect, thank you Stacee! 

## 2020-11-24 ENCOUNTER — Emergency Department (HOSPITAL_COMMUNITY): Payer: Medicare Other

## 2020-11-24 DIAGNOSIS — L8990 Pressure ulcer of unspecified site, unspecified stage: Secondary | ICD-10-CM | POA: Insufficient documentation

## 2020-11-24 DIAGNOSIS — L89319 Pressure ulcer of right buttock, unspecified stage: Secondary | ICD-10-CM | POA: Diagnosis present

## 2020-11-24 DIAGNOSIS — N3001 Acute cystitis with hematuria: Secondary | ICD-10-CM | POA: Diagnosis present

## 2020-11-24 DIAGNOSIS — Z7401 Bed confinement status: Secondary | ICD-10-CM | POA: Diagnosis not present

## 2020-11-24 DIAGNOSIS — N319 Neuromuscular dysfunction of bladder, unspecified: Secondary | ICD-10-CM | POA: Diagnosis present

## 2020-11-24 DIAGNOSIS — L304 Erythema intertrigo: Secondary | ICD-10-CM

## 2020-11-24 DIAGNOSIS — I10 Essential (primary) hypertension: Secondary | ICD-10-CM | POA: Diagnosis present

## 2020-11-24 DIAGNOSIS — M86651 Other chronic osteomyelitis, right thigh: Secondary | ICD-10-CM | POA: Diagnosis not present

## 2020-11-24 DIAGNOSIS — Z79899 Other long term (current) drug therapy: Secondary | ICD-10-CM | POA: Diagnosis not present

## 2020-11-24 DIAGNOSIS — A499 Bacterial infection, unspecified: Secondary | ICD-10-CM | POA: Diagnosis not present

## 2020-11-24 DIAGNOSIS — R7881 Bacteremia: Secondary | ICD-10-CM | POA: Diagnosis not present

## 2020-11-24 DIAGNOSIS — L89154 Pressure ulcer of sacral region, stage 4: Secondary | ICD-10-CM | POA: Diagnosis not present

## 2020-11-24 DIAGNOSIS — B3789 Other sites of candidiasis: Secondary | ICD-10-CM | POA: Diagnosis not present

## 2020-11-24 DIAGNOSIS — N179 Acute kidney failure, unspecified: Secondary | ICD-10-CM | POA: Diagnosis present

## 2020-11-24 DIAGNOSIS — Z794 Long term (current) use of insulin: Secondary | ICD-10-CM

## 2020-11-24 DIAGNOSIS — L209 Atopic dermatitis, unspecified: Secondary | ICD-10-CM | POA: Diagnosis present

## 2020-11-24 DIAGNOSIS — Z1612 Extended spectrum beta lactamase (ESBL) resistance: Secondary | ICD-10-CM | POA: Diagnosis present

## 2020-11-24 DIAGNOSIS — M868X0 Other osteomyelitis, multiple sites: Secondary | ICD-10-CM | POA: Diagnosis not present

## 2020-11-24 DIAGNOSIS — E1151 Type 2 diabetes mellitus with diabetic peripheral angiopathy without gangrene: Secondary | ICD-10-CM | POA: Diagnosis present

## 2020-11-24 DIAGNOSIS — R220 Localized swelling, mass and lump, head: Secondary | ICD-10-CM

## 2020-11-24 DIAGNOSIS — E785 Hyperlipidemia, unspecified: Secondary | ICD-10-CM | POA: Diagnosis present

## 2020-11-24 DIAGNOSIS — D509 Iron deficiency anemia, unspecified: Secondary | ICD-10-CM | POA: Diagnosis present

## 2020-11-24 DIAGNOSIS — R310 Gross hematuria: Secondary | ICD-10-CM

## 2020-11-24 DIAGNOSIS — A4151 Sepsis due to Escherichia coli [E. coli]: Secondary | ICD-10-CM | POA: Diagnosis present

## 2020-11-24 DIAGNOSIS — E876 Hypokalemia: Secondary | ICD-10-CM

## 2020-11-24 DIAGNOSIS — Z20822 Contact with and (suspected) exposure to covid-19: Secondary | ICD-10-CM | POA: Diagnosis present

## 2020-11-24 DIAGNOSIS — Z89611 Acquired absence of right leg above knee: Secondary | ICD-10-CM

## 2020-11-24 DIAGNOSIS — B9620 Unspecified Escherichia coli [E. coli] as the cause of diseases classified elsewhere: Secondary | ICD-10-CM | POA: Diagnosis not present

## 2020-11-24 DIAGNOSIS — L89153 Pressure ulcer of sacral region, stage 3: Secondary | ICD-10-CM | POA: Diagnosis present

## 2020-11-24 DIAGNOSIS — R000 Tachycardia, unspecified: Secondary | ICD-10-CM

## 2020-11-24 DIAGNOSIS — Z7984 Long term (current) use of oral hypoglycemic drugs: Secondary | ICD-10-CM | POA: Diagnosis not present

## 2020-11-24 DIAGNOSIS — B9562 Methicillin resistant Staphylococcus aureus infection as the cause of diseases classified elsewhere: Secondary | ICD-10-CM | POA: Diagnosis present

## 2020-11-24 DIAGNOSIS — B9629 Other Escherichia coli [E. coli] as the cause of diseases classified elsewhere: Secondary | ICD-10-CM | POA: Diagnosis not present

## 2020-11-24 DIAGNOSIS — N390 Urinary tract infection, site not specified: Secondary | ICD-10-CM | POA: Diagnosis present

## 2020-11-24 DIAGNOSIS — B379 Candidiasis, unspecified: Secondary | ICD-10-CM | POA: Diagnosis present

## 2020-11-24 DIAGNOSIS — E1169 Type 2 diabetes mellitus with other specified complication: Secondary | ICD-10-CM | POA: Diagnosis present

## 2020-11-24 DIAGNOSIS — D62 Acute posthemorrhagic anemia: Secondary | ICD-10-CM | POA: Diagnosis present

## 2020-11-24 DIAGNOSIS — M8668 Other chronic osteomyelitis, other site: Secondary | ICD-10-CM

## 2020-11-24 DIAGNOSIS — A419 Sepsis, unspecified organism: Secondary | ICD-10-CM | POA: Insufficient documentation

## 2020-11-24 DIAGNOSIS — G8220 Paraplegia, unspecified: Secondary | ICD-10-CM | POA: Diagnosis present

## 2020-11-24 DIAGNOSIS — E119 Type 2 diabetes mellitus without complications: Secondary | ICD-10-CM

## 2020-11-24 LAB — BLOOD CULTURE ID PANEL (REFLEXED) - BCID2
A.calcoaceticus-baumannii: NOT DETECTED
Bacteroides fragilis: NOT DETECTED
CTX-M ESBL: DETECTED — AB
Candida albicans: NOT DETECTED
Candida auris: NOT DETECTED
Candida glabrata: NOT DETECTED
Candida krusei: NOT DETECTED
Candida parapsilosis: NOT DETECTED
Candida tropicalis: NOT DETECTED
Carbapenem resist OXA 48 LIKE: NOT DETECTED
Carbapenem resistance IMP: NOT DETECTED
Carbapenem resistance KPC: NOT DETECTED
Carbapenem resistance NDM: NOT DETECTED
Carbapenem resistance VIM: NOT DETECTED
Cryptococcus neoformans/gattii: NOT DETECTED
Enterobacter cloacae complex: NOT DETECTED
Enterobacterales: DETECTED — AB
Enterococcus Faecium: NOT DETECTED
Enterococcus faecalis: NOT DETECTED
Escherichia coli: DETECTED — AB
Haemophilus influenzae: NOT DETECTED
Klebsiella aerogenes: NOT DETECTED
Klebsiella oxytoca: NOT DETECTED
Klebsiella pneumoniae: NOT DETECTED
Listeria monocytogenes: NOT DETECTED
Meth resistant mecA/C and MREJ: DETECTED — AB
Neisseria meningitidis: NOT DETECTED
Proteus species: NOT DETECTED
Pseudomonas aeruginosa: NOT DETECTED
Salmonella species: NOT DETECTED
Serratia marcescens: NOT DETECTED
Staphylococcus aureus (BCID): DETECTED — AB
Staphylococcus epidermidis: NOT DETECTED
Staphylococcus lugdunensis: NOT DETECTED
Staphylococcus species: DETECTED — AB
Stenotrophomonas maltophilia: NOT DETECTED
Streptococcus agalactiae: NOT DETECTED
Streptococcus pneumoniae: NOT DETECTED
Streptococcus pyogenes: NOT DETECTED
Streptococcus species: NOT DETECTED

## 2020-11-24 LAB — BASIC METABOLIC PANEL
Anion gap: 17 — ABNORMAL HIGH (ref 5–15)
BUN: 26 mg/dL — ABNORMAL HIGH (ref 8–23)
CO2: 18 mmol/L — ABNORMAL LOW (ref 22–32)
Calcium: 8.5 mg/dL — ABNORMAL LOW (ref 8.9–10.3)
Chloride: 106 mmol/L (ref 98–111)
Creatinine, Ser: 1.06 mg/dL (ref 0.61–1.24)
GFR, Estimated: >60 mL/min (ref >60)
Glucose, Bld: 111 mg/dL — ABNORMAL HIGH (ref 70–99)
Potassium: 3.2 mmol/L — ABNORMAL LOW (ref 3.5–5.1)
Sodium: 141 mmol/L (ref 135–145)

## 2020-11-24 LAB — DIFFERENTIAL
Abs Immature Granulocytes: 0.09 10*3/uL — ABNORMAL HIGH (ref 0.00–0.07)
Basophils Absolute: 0.1 10*3/uL (ref 0.0–0.1)
Basophils Relative: 0 %
Eosinophils Absolute: 0 10*3/uL (ref 0.0–0.5)
Eosinophils Relative: 0 %
Immature Granulocytes: 1 %
Lymphocytes Relative: 2 %
Lymphs Abs: 0.3 10*3/uL — ABNORMAL LOW (ref 0.7–4.0)
Monocytes Absolute: 0.1 10*3/uL (ref 0.1–1.0)
Monocytes Relative: 1 %
Neutro Abs: 13.5 10*3/uL — ABNORMAL HIGH (ref 1.7–7.7)
Neutrophils Relative %: 96 %

## 2020-11-24 LAB — URINALYSIS, ROUTINE W REFLEX MICROSCOPIC
Glucose, UA: NEGATIVE mg/dL
Ketones, ur: NEGATIVE mg/dL
Nitrite: POSITIVE — AB
Protein, ur: >300 mg/dL — AB
Specific Gravity, Urine: 1.015 (ref 1.005–1.030)
pH: 8.5 — ABNORMAL HIGH (ref 5.0–8.0)

## 2020-11-24 LAB — CBC
HCT: 38.5 % — ABNORMAL LOW (ref 39.0–52.0)
Hemoglobin: 11.2 g/dL — ABNORMAL LOW (ref 13.0–17.0)
MCH: 20.3 pg — ABNORMAL LOW (ref 26.0–34.0)
MCHC: 29.1 g/dL — ABNORMAL LOW (ref 30.0–36.0)
MCV: 69.9 fL — ABNORMAL LOW (ref 80.0–100.0)
Platelets: 322 10*3/uL (ref 150–400)
RBC: 5.51 MIL/uL (ref 4.22–5.81)
RDW: 21.8 % — ABNORMAL HIGH (ref 11.5–15.5)
WBC: 14.6 10*3/uL — ABNORMAL HIGH (ref 4.0–10.5)
nRBC: 0 % (ref 0.0–0.2)

## 2020-11-24 LAB — SEDIMENTATION RATE: Sed Rate: 23 mm/hr — ABNORMAL HIGH (ref 0–16)

## 2020-11-24 LAB — RESP PANEL BY RT-PCR (FLU A&B, COVID) ARPGX2
Influenza A by PCR: NEGATIVE
Influenza B by PCR: NEGATIVE
SARS Coronavirus 2 by RT PCR: NEGATIVE

## 2020-11-24 LAB — LACTIC ACID, PLASMA
Lactic Acid, Venous: 3.2 mmol/L (ref 0.5–1.9)
Lactic Acid, Venous: 3 mmol/L (ref 0.5–1.9)
Lactic Acid, Venous: 1.9 mmol/L (ref 0.5–1.9)
Lactic Acid, Venous: 2.1 mmol/L (ref 0.5–1.9)

## 2020-11-24 LAB — URINALYSIS, MICROSCOPIC (REFLEX): RBC / HPF: >50 RBC/hpf (ref 0–5)

## 2020-11-24 LAB — CBG MONITORING, ED
Glucose-Capillary: 102 mg/dL — ABNORMAL HIGH (ref 70–99)
Glucose-Capillary: 108 mg/dL — ABNORMAL HIGH (ref 70–99)
Glucose-Capillary: 132 mg/dL — ABNORMAL HIGH (ref 70–99)

## 2020-11-24 LAB — GLUCOSE, CAPILLARY: Glucose-Capillary: 113 mg/dL — ABNORMAL HIGH (ref 70–99)

## 2020-11-24 LAB — C-REACTIVE PROTEIN: CRP: 7.2 mg/dL — ABNORMAL HIGH (ref <1.0)

## 2020-11-24 MED ORDER — INSULIN ASPART 100 UNIT/ML ~~LOC~~ SOLN
0.0000 [IU] | Freq: Three times a day (TID) | SUBCUTANEOUS | Status: DC
  Administered 2020-11-24: 18:00:00 2 [IU] via SUBCUTANEOUS
  Administered 2020-11-25 (×2): 3 [IU] via SUBCUTANEOUS
  Administered 2020-11-28 – 2020-11-29 (×2): 2 [IU] via SUBCUTANEOUS

## 2020-11-24 MED ORDER — EZETIMIBE 10 MG PO TABS
10.0000 mg | ORAL_TABLET | Freq: Every day | ORAL | Status: DC
  Administered 2020-11-24 – 2020-11-29 (×6): 10 mg via ORAL
  Filled 2020-11-24 (×6): qty 1

## 2020-11-24 MED ORDER — CHLORHEXIDINE GLUCONATE CLOTH 2 % EX PADS
6.0000 | MEDICATED_PAD | Freq: Every day | CUTANEOUS | Status: DC
  Administered 2020-11-27 – 2020-11-28 (×2): 6 via TOPICAL

## 2020-11-24 MED ORDER — LACTATED RINGERS IV BOLUS
1000.0000 mL | Freq: Once | INTRAVENOUS | Status: AC
  Administered 2020-11-24: 13:00:00 1000 mL via INTRAVENOUS

## 2020-11-24 MED ORDER — MIRTAZAPINE 15 MG PO TABS
7.5000 mg | ORAL_TABLET | Freq: Two times a day (BID) | ORAL | Status: DC | PRN
  Administered 2020-11-24 – 2020-11-29 (×5): 7.5 mg via ORAL
  Filled 2020-11-24 (×6): qty 1

## 2020-11-24 MED ORDER — SODIUM CHLORIDE 0.9 % IV SOLN
1.0000 g | Freq: Once | INTRAVENOUS | Status: AC
  Administered 2020-11-24: 06:00:00 1 g via INTRAVENOUS
  Filled 2020-11-24: qty 10

## 2020-11-24 MED ORDER — TRIAMCINOLONE ACETONIDE 0.1 % EX CREA
TOPICAL_CREAM | Freq: Three times a day (TID) | CUTANEOUS | Status: DC
  Filled 2020-11-24: qty 15

## 2020-11-24 MED ORDER — PANTOPRAZOLE SODIUM 40 MG PO TBEC
80.0000 mg | DELAYED_RELEASE_TABLET | Freq: Every day | ORAL | Status: DC
  Administered 2020-11-24 – 2020-11-25 (×2): 80 mg via ORAL
  Filled 2020-11-24 (×2): qty 2

## 2020-11-24 MED ORDER — COLLAGENASE 250 UNIT/GM EX OINT
TOPICAL_OINTMENT | Freq: Every day | CUTANEOUS | Status: DC
  Filled 2020-11-24 (×2): qty 30

## 2020-11-24 MED ORDER — SODIUM CHLORIDE 0.9 % IV SOLN
2.0000 g | Freq: Three times a day (TID) | INTRAVENOUS | Status: DC
  Administered 2020-11-24 – 2020-11-28 (×11): 2 g via INTRAVENOUS
  Filled 2020-11-24 (×14): qty 2

## 2020-11-24 MED ORDER — TRIAMCINOLONE ACETONIDE 0.1 % EX OINT
TOPICAL_OINTMENT | Freq: Three times a day (TID) | CUTANEOUS | Status: DC
  Filled 2020-11-24: qty 15

## 2020-11-24 MED ORDER — LACTATED RINGERS IV BOLUS
1000.0000 mL | Freq: Once | INTRAVENOUS | Status: AC
  Administered 2020-11-24: 05:00:00 1000 mL via INTRAVENOUS

## 2020-11-24 MED ORDER — SODIUM CHLORIDE 0.9 % IV SOLN
2.0000 g | Freq: Three times a day (TID) | INTRAVENOUS | Status: DC
  Administered 2020-11-24: 12:00:00 2 g via INTRAVENOUS
  Filled 2020-11-24: qty 2

## 2020-11-24 MED ORDER — GABAPENTIN 300 MG PO CAPS
300.0000 mg | ORAL_CAPSULE | Freq: Two times a day (BID) | ORAL | Status: DC
  Administered 2020-11-24 – 2020-11-28 (×9): 300 mg via ORAL
  Filled 2020-11-24 (×9): qty 1

## 2020-11-24 MED ORDER — POTASSIUM CHLORIDE CRYS ER 20 MEQ PO TBCR
40.0000 meq | EXTENDED_RELEASE_TABLET | Freq: Two times a day (BID) | ORAL | Status: AC
  Administered 2020-11-24 (×2): 40 meq via ORAL
  Filled 2020-11-24: qty 2

## 2020-11-24 MED ORDER — VANCOMYCIN HCL IN DEXTROSE 1-5 GM/200ML-% IV SOLN
1000.0000 mg | Freq: Two times a day (BID) | INTRAVENOUS | Status: DC
  Administered 2020-11-24 – 2020-11-27 (×7): 1000 mg via INTRAVENOUS
  Filled 2020-11-24 (×8): qty 200

## 2020-11-24 MED ORDER — ACETAMINOPHEN 325 MG PO TABS
650.0000 mg | ORAL_TABLET | Freq: Four times a day (QID) | ORAL | Status: DC | PRN
  Administered 2020-11-24 – 2020-11-29 (×7): 650 mg via ORAL
  Filled 2020-11-24 (×7): qty 2

## 2020-11-24 MED ORDER — LACTATED RINGERS IV BOLUS
30.0000 mL/kg | Freq: Once | INTRAVENOUS | Status: AC
  Administered 2020-11-24: 06:00:00 2313 mL via INTRAVENOUS

## 2020-11-24 MED ORDER — ACETAMINOPHEN 650 MG RE SUPP: 650.0000 mg | Freq: Four times a day (QID) | RECTAL | Status: DC | PRN

## 2020-11-24 NOTE — Hospital Course (Addendum)
Admitted 11/23/2020  Allergies: Penicillins and Statins Pertinent Hx: HTN, HLD, paraplegia with chronic suprapubic cath, R AKA, decubitus ulcer, chronic osteomyelitis  69 y.o. male p/w hematuria, lips swelling, rash  * Sepsis 2/2 UTI: Tachy, soft Bps, U/A showed +nitrites, leukocytes, hgb, and bacteria. WBC 14, LA 3.2. EDP changed catheter. Given ceftriaxone, switched to cefepime. Getting blood and urine cultures.   *Paraplegia, decubitus ulcer, chronic OM: No obvious drainage noted, does not appear to be source at this time. Consulted wound care.   *Diffuse rash: Diffuse plaques, unclear etiology, resumed home steroid cream.   *Lip swelling: No other associated symptoms, occurred after taking gabapentin and metformin, resolved with benadryl.  Holding metformin for today.   Consults: None  Meds: Cefepime, gabapentin, mirtazipine, SSI, kenalog VTE ppx: SCDs IVF: None Diet: HH   *** # Acute on Chronic Microcytic Anemia, Stable  Likely acute blood loss from deep sacral wounds; however, does have significant microcytosis with target cells and ovalocytes concerning for thalassemia.   - May benefit from outpatient iron studies / thalassemia workup - No immediate management necessary  - Transfuse if Hgb < 7.0

## 2020-11-24 NOTE — ED Notes (Signed)
Pt to Room 21; suprapubic catheter noted to have small amount of blood, with clear, yellow urine noted to sheet beneath patient.  MD to bedside; new 67F catheter inserted and secured, with return of 852ml bloody urine.  Specimen sent to lab.

## 2020-11-24 NOTE — Consult Note (Signed)
WOC Nurse Consult Note: Patient receiving care in [facility] ED room 21.  Assisted with turning patient by primary RN, Santiago Glad, and 2 NTs.  The patient was heavily soiled with soft brown stool.  ED RN and NTs cleanse patient of stool. Reason for Consult: sacral ulcer stage 3 Wound type: stage 3 PI to sacrum Pressure Injury POA: Yes  Measurement: Entire sacral wound measured 10.5 cm x 3 cm x 0.5 cm with undermining from 9 to 12 of 1.3 cm.  Wound bed was pink.  The topical care for this wound is: Cleanse sacral wound with saline or soap and water. Pat dry. Place a Xeroform gauze into the upper margin of the wound in the area of undermining. Cover the entire wound with a foam dressing. Change daily.  Patient reported a right hip wound. He was turned and I found a stage 4 PI to the right ischial area.  It measured 4 cm x 3 cm x 3 cm.  The wound base was grey.  I will order santyl for this wound.  Both wounds are surrounded by partial thickness skin loss areas that are all clean and pink.  I am not sure if these areas were initially part of the wounds, or possibly related to skin stripping from tape.  For these areas I have added for them to be covered with Xeroform gauze.  I have ordered a standard size bed with air mattress.  Monitor the wound area(s) for worsening of condition such as: Signs/symptoms of infection,  Increase in size,  Development of or worsening of odor, Development of pain, or increased pain at the affected locations.  Notify the medical team if any of these develop.  Thank you for the consult.  Discussed plan of care with the patient and bedside nurse.  [facility] nurse will not follow at this time.  Please re-consult the [facility] team if needed.  Val Riles, RN, MSN, CWOCN, CNS-BC, pager [phone]

## 2020-11-24 NOTE — Progress Notes (Signed)
.  PHARMACY - PHYSICIAN COMMUNICATION CRITICAL VALUE ALERT - BLOOD CULTURE IDENTIFICATION (BCID)  Shawn [name]. is an 69 y.o. male who presented to [facility] on 11/23/2020   Assessment: ESBL Ecoli, MRSA in blood  Name of physician (or Provider) Contacted: Dr. Laural Golden  Current antibiotics: Cefepime  Results for orders placed or performed during the hospital encounter of 11/23/20  Blood Culture ID Panel (Reflexed) (Collected: 11/24/2020  4:35 AM)  Result Value Ref Range   Enterococcus faecalis NOT DETECTED NOT DETECTED   Enterococcus Faecium NOT DETECTED NOT DETECTED   Listeria monocytogenes NOT DETECTED NOT DETECTED   Staphylococcus species DETECTED (A) NOT DETECTED   Staphylococcus aureus (BCID) DETECTED (A) NOT DETECTED   Staphylococcus epidermidis NOT DETECTED NOT DETECTED   Staphylococcus lugdunensis NOT DETECTED NOT DETECTED   Streptococcus species NOT DETECTED NOT DETECTED   Streptococcus agalactiae NOT DETECTED NOT DETECTED   Streptococcus pneumoniae NOT DETECTED NOT DETECTED   Streptococcus pyogenes NOT DETECTED NOT DETECTED   A.calcoaceticus-baumannii NOT DETECTED NOT DETECTED   Bacteroides fragilis NOT DETECTED NOT DETECTED   Enterobacterales DETECTED (A) NOT DETECTED   Enterobacter cloacae complex NOT DETECTED NOT DETECTED   Escherichia coli DETECTED (A) NOT DETECTED   Klebsiella aerogenes NOT DETECTED NOT DETECTED   Klebsiella oxytoca NOT DETECTED NOT DETECTED   Klebsiella pneumoniae NOT DETECTED NOT DETECTED   Proteus species NOT DETECTED NOT DETECTED   Salmonella species NOT DETECTED NOT DETECTED   Serratia marcescens NOT DETECTED NOT DETECTED   Haemophilus influenzae NOT DETECTED NOT DETECTED   Neisseria meningitidis NOT DETECTED NOT DETECTED   Pseudomonas aeruginosa NOT DETECTED NOT DETECTED   Stenotrophomonas maltophilia NOT DETECTED NOT DETECTED   Candida albicans NOT DETECTED NOT DETECTED   Candida auris NOT DETECTED NOT DETECTED   Candida glabrata NOT  DETECTED NOT DETECTED   Candida krusei NOT DETECTED NOT DETECTED   Candida parapsilosis NOT DETECTED NOT DETECTED   Candida tropicalis NOT DETECTED NOT DETECTED   Cryptococcus neoformans/gattii NOT DETECTED NOT DETECTED   CTX-M ESBL DETECTED (A) NOT DETECTED   Carbapenem resistance IMP NOT DETECTED NOT DETECTED   Carbapenem resistance KPC NOT DETECTED NOT DETECTED   Meth resistant mecA/C and MREJ DETECTED (A) NOT DETECTED   Carbapenem resistance NDM NOT DETECTED NOT DETECTED   Carbapenem resist OXA 48 LIKE NOT DETECTED NOT DETECTED   Carbapenem resistance VIM NOT DETECTED NOT DETECTED    Changes to prescribed antibiotics recommended:  DC Cefepime Meropenem 2 g q8h Vanc 1 g q12 Monitor renal fx cx vanc lvls prn F/U ID Consult   Barth Kirks, PharmD, BCPS, BCCCP Clinical Pharmacist [phone]  Please check AMION for all [place] numbers  11/24/2020 9:01 PM

## 2020-11-24 NOTE — ED Notes (Signed)
Coccyx decub packed with saline soaked gauze, covered with mepalex bandage per instructions from wound care nurse. 4x4 placed at suprapubic catheter insertion site.  Incontinent of stool - ADL completed.

## 2020-11-24 NOTE — ED Notes (Addendum)
Date and time results received: 11/24/20 0545 (use smartphrase ".now" to insert current time)  Test: Lactic Critical Value: 3.2  Name of Provider Notified: Karle Starch  Orders Received? Or Actions Taken?: Awaiting orders

## 2020-11-24 NOTE — ED Provider Notes (Signed)
[facility] EMERGENCY DEPARTMENT Provider Note  CSN: [id number] Arrival date & time: 11/23/20 1516    History Chief Complaint  Patient presents with  . Hematuria    HPI  Shawn [name]. is a 69 y.o. male with history of paraplegia and chronic suprapubic catheter had his catheter exchanged Thursday afternoon by home health who apparently saw blood in the bag and so they called EMS to bring him to the ED. He denies any complaints at the time of my evaluation. Per EMR notes, there was also apparently concern for lip swelling by Home Health. Patient denies any lip or tongue swelling now, states he is thirsty and wants something to drink.    Past Medical History:  Diagnosis Date  . Allergy   . Arthritis   . C. difficile colitis 11/2008  . Decubitus ulcer 1999   excision of right ischial pressure sore with flap reconstruction done by Dr. Denese Killings 10/31/2008  . Diabetic ketosis ([place]) 05/05/2019  . GERD (gastroesophageal reflux disease)   . Hyperlipidemia   . Hypertension   . Paraplegia ([place])    secondary to [place]  . Perineal abscess   . Substance abuse ([place])    alcohol    Past Surgical History:  Procedure Laterality Date  . BONE BIOPSY  11/2007   negative for organisms  . COLONOSCOPY WITH PROPOFOL N/A 08/27/2017   Procedure: COLONOSCOPY WITH PROPOFOL;  Surgeon: Irene Shipper, MD;  Location: [facility];  Service: Endoscopy;  Laterality: N/A;  . sacral wound flap  2002   done by Dr. Stephanie Coup  . SPINE SURGERY    . THROAT SURGERY      Family History  Problem Relation Age of Onset  . Stroke Mother   . Coronary artery disease Father   . Coronary artery disease Paternal Uncle   . Coronary artery disease Paternal Aunt     Social History   Tobacco Use  . Smoking status: Former Smoker    Packs/day: 0.50    Years: 40.00    Pack years: 20.00    Types: Cigarettes    Quit date: 07/09/2020    Years since quitting: 0.3  . Smokeless tobacco: Never Used  . Tobacco comment:  1 pack every 2 -3 days  Vaping Use  . Vaping Use: Never used  Substance Use Topics  . Alcohol use: No    Alcohol/week: 0.0 standard drinks  . Drug use: No     Home Medications Prior to Admission medications   Medication Sig Start Date End Date Taking? Authorizing Provider  Accu-Chek Softclix Lancets lancets Use as instructed to check blood sugar one time a day 11/07/20   Katsadouros, Vasilios, MD  Amino Acids-Protein Hydrolys (FEEDING SUPPLEMENT, PRO-STAT 64,) LIQD Take 30 mLs by mouth daily. 10/11/20   Katsadouros, Vasilios, MD  amLODipine (NORVASC) 10 MG tablet Take 1 tablet (10 mg total) by mouth daily. 10/11/20   Katsadouros, Vasilios, MD  diclofenac sodium (VOLTAREN) 1 % GEL APPLY TOPICALLY 4 TIMES DAILY. Patient taking differently: Apply 2 g topically 4 (four) times daily as needed (pain).  11/13/17   Jamse Arn, MD  ezetimibe (ZETIA) 10 MG tablet Take 1 tablet (10 mg total) by mouth daily. 10/11/20   Katsadouros, Vasilios, MD  ferrous sulfate 325 (65 FE) MG EC tablet Take 1 tablet (325 mg total) by mouth every other day. 10/12/20 10/07/21  Riesa Pope, MD  gabapentin (NEURONTIN) 300 MG capsule Take 1 capsule (300 mg total) by mouth 2 (  two) times daily. 11/20/20 02/18/21  Jean Rosenthal, MD  glucose blood (ACCU-CHEK GUIDE) test strip Check blood sugar 1 time per day 11/07/20   Riesa Pope, MD  insulin glargine (LANTUS) 100 UNIT/ML injection Inject 0.18 mLs (18 Units total) into the skin at bedtime. 06/03/20   Johnson, Clanford L, MD  ketoconazole (NIZORAL) 2 % cream Apply topically 2 (two) times daily. 06/03/20   Johnson, Clanford L, MD  metFORMIN (GLUCOPHAGE XR) 500 MG 24 hr tablet Take 2 tablets (1,000 mg total) by mouth in the morning and at bedtime. 10/11/20 04/09/21  Katsadouros, Vasilios, MD  methocarbamol (ROBAXIN) 500 MG tablet TAKE 1 TABLET BY MOUTH 2 TIMES DAILY AS NEEDED FOR MUSCLE SPASMS. 10/11/20   Katsadouros, Vasilios, MD  mirtazapine (REMERON) 7.5 MG  tablet TAKE 1 TABLET BY MOUTH AT BEDTIME. 11/06/20   Katsadouros, Vasilios, MD  Nutritional Supplements (ENSURE COMPACT) LIQD Take 237 mLs by mouth 3 (three) times daily. Ensure Premier---only 1gm of carbs per 8 0z    [provider]  omeprazole (PRILOSEC) 40 MG capsule Take 1 capsule (40 mg total) by mouth daily. 10/11/20   Riesa Pope, MD  thiamine 100 MG tablet Take 1 tablet (100 mg total) by mouth daily. 10/11/20   Katsadouros, Vasilios, MD  triamcinolone (KENALOG) 0.1 % Apply topically 3 (three) times daily. To rash on arms 11/06/20   Asencion Noble, MD  zinc sulfate 220 (50 Zn) MG capsule Take 1 capsule (220 mg total) by mouth daily. 10/11/20 04/09/21  Riesa Pope, MD     Allergies    Penicillins and Statins   Review of Systems   Review of Systems A comprehensive review of systems was completed and negative except as noted in HPI.    Physical Exam BP (!) 120/93   Pulse (!) 128   Temp 98.5 F (36.9 C)   Resp 16   Ht 6' 1.5" (1.867 m)   Wt 77.1 kg   SpO2 98%   BMI 22.12 kg/m   Physical Exam Vitals and nursing note reviewed.  Constitutional:      Appearance: Normal appearance.  HENT:     Head: Normocephalic and atraumatic.     Nose: Nose normal.     Mouth/Throat:     Mouth: Mucous membranes are moist.  Eyes:     Extraocular Movements: Extraocular movements intact.     Conjunctiva/sclera: Conjunctivae normal.  Cardiovascular:     Rate and Rhythm: Tachycardia present.  Pulmonary:     Effort: Pulmonary effort is normal.     Breath sounds: Normal breath sounds.  Abdominal:     General: Abdomen is flat.     Palpations: Abdomen is soft.     Tenderness: There is no abdominal tenderness.     Comments: Suprapubic catheter in place  Musculoskeletal:        General: No swelling. Normal range of motion.     Cervical back: Neck supple.     Comments: R AKA  Skin:    General: Skin is warm and dry.  Neurological:     General: No focal deficit  present.     Mental Status: He is alert.  Psychiatric:        Mood and Affect: Mood normal.      ED Results / Procedures / Treatments   Labs (all labs ordered are listed, but only abnormal results are displayed) Labs Reviewed  URINALYSIS, ROUTINE W REFLEX MICROSCOPIC - Abnormal; Notable for the following components:  Result Value   Color, Urine RED (*)    APPearance TURBID (*)    pH 8.5 (*)    Hgb urine dipstick LARGE (*)    Bilirubin Urine SMALL (*)    Protein, ur >300 (*)    Nitrite POSITIVE (*)    Leukocytes,Ua MODERATE (*)    All other components within normal limits  CBC - Abnormal; Notable for the following components:   WBC 14.6 (*)    Hemoglobin 11.2 (*)    HCT 38.5 (*)    MCV 69.9 (*)    MCH 20.3 (*)    MCHC 29.1 (*)    RDW 21.8 (*)    All other components within normal limits  BASIC METABOLIC PANEL - Abnormal; Notable for the following components:   Potassium 3.2 (*)    CO2 18 (*)    Glucose, Bld 111 (*)    BUN 26 (*)    Calcium 8.5 (*)    Anion gap 17 (*)    All other components within normal limits  LACTIC ACID, PLASMA - Abnormal; Notable for the following components:   Lactic Acid, Venous 3.2 (*)    All other components within normal limits  URINALYSIS, MICROSCOPIC (REFLEX) - Abnormal; Notable for the following components:   Bacteria, UA MANY (*)    All other components within normal limits  CBG MONITORING, ED - Abnormal; Notable for the following components:   Glucose-Capillary 102 (*)    All other components within normal limits  CULTURE, BLOOD (ROUTINE X 2)  CULTURE, BLOOD (ROUTINE X 2)  URINE CULTURE  RESP PANEL BY RT-PCR (FLU A&B, COVID) ARPGX2  LACTIC ACID, PLASMA    EKG EKG Interpretation  Date/Time:  Friday November 24 2020 05:00:48 EST Ventricular Rate:  126 PR Interval:    QRS Duration: 91 QT Interval:  317 QTC Calculation: 459 R Axis:   22 Text Interpretation: Sinus tachycardia Anteroseptal infarct, age indeterminate No  significant change since last tracing Confirmed by Calvert Cantor [phone]) on 11/24/2020 5:03:40 AM   Radiology DG Chest Port 1 View  Result Date: 11/24/2020 CLINICAL DATA:  Sepsis. EXAM: PORTABLE CHEST 1 VIEW COMPARISON:  05/30/2020 FINDINGS: 0606 hours. Lordotic positioning. The lungs are clear without focal pneumonia, edema, pneumothorax or pleural effusion. Interstitial markings are diffusely coarsened with chronic features. Cardiopericardial silhouette is at upper limits of normal for size. The visualized bony structures of the thorax show no acute abnormality. Telemetry leads overlie the chest. IMPRESSION: Chronic interstitial coarsening without acute cardiopulmonary findings. Electronically Signed   By: Misty Stanley M.D.   On: 11/24/2020 06:20    Procedures BLADDER CATHETERIZATION  Date/Time: 11/24/2020 4:37 AM Performed by: Truddie Hidden, MD Authorized by: Truddie Hidden, MD   Consent:    Consent obtained:  Verbal   Consent given by:  Patient   Risks discussed:  False passage and pain   Alternatives discussed:  No treatment Universal protocol:    Immediately prior to procedure, a time out was called: yes     Patient identity confirmed:  Verbally with patient Pre-procedure details:    Procedure purpose:  Therapeutic Procedure details:    Provider performed due to:  Complicated insertion   Catheter insertion:  Indwelling (suprapubic)   Catheter type:  Foley   Catheter size:  16 Fr   Bladder irrigation: no     Number of attempts:  1   Urine characteristics:  Blood-tinged Post-procedure details:    Procedure completion:  Tolerated  Medications Ordered in the ED Medications  cefTRIAXone (ROCEPHIN) 1 g in sodium chloride 0.9 % 100 mL IVPB (1 g Intravenous New Bag/Given 11/24/20 0624)  lactated ringers bolus 1,000 mL (0 mLs Intravenous Stopped 11/24/20 0542)  lactated ringers bolus 2,313 mL (2,313 mLs Intravenous New Bag/Given 11/24/20 0621)     MDM  Rules/Calculators/A&P MDM Patient not noted to be hypotensive while in the ED waiting room on multiple vital signs checks, but on arrival to ED exam room BP had dropped to 60s. He was placed in trendelenburg with some improvement. Labs and LR bolus ordered. Suprapubic catheter noted to not be draining with urine leaking around the catheter and from his urethra as well. Attempts to flush catheter were unsuccessful and so it was replaced with a 16Fr foley with drainage of large amount of blood tinged urine.   ED Course  I have reviewed the triage vital signs and the nursing notes.  Pertinent labs & imaging results that were available during my care of the patient were reviewed by me and considered in my medical decision making (see chart for details).  Clinical Course as of 11/24/20 0625  Fri Nov 24, 2020  0454 BP and HR improving. Patient asking for something to eat.  [CS]  6384 CBG is 102.  [CS]  6659 CBC with leukocytosis, mild anemia is at baseline. BMP without signs of significant dehydration. UA concerning for infection but large blood makes interpretation difficult. With continued tachycardia will treat as sepsis, Rocephin ordered for urine source. Delay in identification of sepsis due to evolving vitals during a prolonged wait due to ED overcrowding. Additional LR bolus ordered to total 30cc/kg. Plan admission.  [CS]  9357 Spoke with IM Resident on call who will evaluate for admission.  [CS]    Clinical Course User Index [CS] Truddie Hidden, MD    Final Clinical Impression(s) / ED Diagnoses Final diagnoses:  Acute cystitis with hematuria  Sepsis without acute organ dysfunction, due to unspecified organism [facility])    Rx / DC Orders ED Discharge Orders    None       Truddie Hidden, MD 11/24/20 [phone]
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
## 2020-11-24 NOTE — Progress Notes (Signed)
Pharmacy Antibiotic Note  Shawn [name]. is a 69 y.o. male admitted on 11/23/2020 with sepsis with suspected UTI source.  Pharmacy has been consulted for cefepime dosing.  Plan: Cefepime 2g IV every 8 hours Monitor renal function, Cx and clinical progression to narrow  Height: 6' 1.5" (186.7 cm) Weight: 77.1 kg (170 lb) IBW/kg (Calculated) : 81.05  Temp (24hrs), Avg:98.4 F (36.9 C), Min:97.5 F (36.4 C), Max:98.9 F (37.2 C)  Recent Labs  Lab 11/24/20 0435  WBC 14.6*  CREATININE 1.06  LATICACIDVEN 3.2*    Estimated Creatinine Clearance: 69.7 mL/min (by C-G formula based on SCr of 1.06 mg/dL).    Allergies  Allergen Reactions  . Penicillins Hives    Tolerated cefazolin June 2021  . Statins     Uncontrolled diarrhea    Bertis Ruddy, PharmD Clinical Pharmacist ED Pharmacist Phone # [phone] 11/24/2020 8:23 AM

## 2020-11-24 NOTE — Progress Notes (Signed)
New Admission Note:  Arrival Method: Stretcher Mental Orientation: Alert and oriented x 4 Telemetry: Box 19.ST/SR Assessment: Completed Skin: Warm and dry. Flaky all over. Pressure area in sacral and R BKA IV: NSL Pain: Denies Tubes: Supra Pubic cath  Safety Measures: Safety Fall Prevention Plan initiated.  Admission: Completed 5 M  Orientation: Patient has been orientated to the room, unit and the staff. Welcome booklet given.  Family: None  Patient placed on an overlay mattress.   Orders have been reviewed and implemented. Will continue to monitor the patient. Call light has been placed within reach and bed alarm has been activated.   Sima Matas BSN, RN  Phone Number: [phone]

## 2020-11-24 NOTE — ED Notes (Signed)
Please call wife at [phone] or cell [phone]

## 2020-11-25 ENCOUNTER — Inpatient Hospital Stay (HOSPITAL_COMMUNITY): Payer: Medicare Other

## 2020-11-25 ENCOUNTER — Encounter (HOSPITAL_COMMUNITY): Payer: Self-pay | Admitting: Internal Medicine

## 2020-11-25 DIAGNOSIS — L853 Xerosis cutis: Secondary | ICD-10-CM

## 2020-11-25 DIAGNOSIS — R7881 Bacteremia: Secondary | ICD-10-CM

## 2020-11-25 DIAGNOSIS — B9562 Methicillin resistant Staphylococcus aureus infection as the cause of diseases classified elsewhere: Secondary | ICD-10-CM

## 2020-11-25 DIAGNOSIS — Z1612 Extended spectrum beta lactamase (ESBL) resistance: Secondary | ICD-10-CM

## 2020-11-25 DIAGNOSIS — A499 Bacterial infection, unspecified: Secondary | ICD-10-CM

## 2020-11-25 DIAGNOSIS — N319 Neuromuscular dysfunction of bladder, unspecified: Secondary | ICD-10-CM

## 2020-11-25 DIAGNOSIS — M86651 Other chronic osteomyelitis, right thigh: Secondary | ICD-10-CM

## 2020-11-25 DIAGNOSIS — G8220 Paraplegia, unspecified: Secondary | ICD-10-CM

## 2020-11-25 DIAGNOSIS — R21 Rash and other nonspecific skin eruption: Secondary | ICD-10-CM

## 2020-11-25 LAB — CBC
HCT: 32.2 % — ABNORMAL LOW (ref 39.0–52.0)
Hemoglobin: 10.3 g/dL — ABNORMAL LOW (ref 13.0–17.0)
MCH: 21.2 pg — ABNORMAL LOW (ref 26.0–34.0)
MCHC: 32 g/dL (ref 30.0–36.0)
MCV: 66.4 fL — ABNORMAL LOW (ref 80.0–100.0)
Platelets: 194 10*3/uL (ref 150–400)
RBC: 4.85 MIL/uL (ref 4.22–5.81)
RDW: 21.2 % — ABNORMAL HIGH (ref 11.5–15.5)
WBC: 4.8 10*3/uL (ref 4.0–10.5)
nRBC: 0.4 % — ABNORMAL HIGH (ref 0.0–0.2)

## 2020-11-25 LAB — BASIC METABOLIC PANEL
Anion gap: 12 (ref 5–15)
BUN: 24 mg/dL — ABNORMAL HIGH (ref 8–23)
CO2: 22 mmol/L (ref 22–32)
Calcium: 8 mg/dL — ABNORMAL LOW (ref 8.9–10.3)
Chloride: 105 mmol/L (ref 98–111)
Creatinine, Ser: 0.97 mg/dL (ref 0.61–1.24)
GFR, Estimated: >60 mL/min (ref >60)
Glucose, Bld: 89 mg/dL (ref 70–99)
Potassium: 4.1 mmol/L (ref 3.5–5.1)
Sodium: 139 mmol/L (ref 135–145)

## 2020-11-25 LAB — ECHOCARDIOGRAM COMPLETE
Area-P 1/2: 3.6 cm2
Height: 73.5 in
MV M vel: 4.65 m/s
MV Peak grad: 86.5 mmHg
S' Lateral: 3.7 cm
Weight: 2720 oz

## 2020-11-25 LAB — GLUCOSE, CAPILLARY
Glucose-Capillary: 181 mg/dL — ABNORMAL HIGH (ref 70–99)
Glucose-Capillary: 75 mg/dL (ref 70–99)
Glucose-Capillary: 100 mg/dL — ABNORMAL HIGH (ref 70–99)

## 2020-11-25 LAB — PROTIME-INR
INR: 1.2 (ref 0.8–1.2)
Prothrombin Time: 14.8 seconds (ref 11.4–15.2)

## 2020-11-25 LAB — LACTIC ACID, PLASMA
Lactic Acid, Venous: 2.3 mmol/L (ref 0.5–1.9)
Lactic Acid, Venous: 6.2 mmol/L (ref 0.5–1.9)

## 2020-11-25 MED ORDER — LACTATED RINGERS IV BOLUS: 500.0000 mL | Freq: Once | INTRAVENOUS | Status: DC

## 2020-11-25 MED ORDER — GADOBUTROL 1 MMOL/ML IV SOLN
7.5000 mL | Freq: Once | INTRAVENOUS | Status: AC | PRN
  Administered 2020-11-25: 7.5 mL via INTRAVENOUS

## 2020-11-25 MED ORDER — PANTOPRAZOLE SODIUM 40 MG PO TBEC
40.0000 mg | DELAYED_RELEASE_TABLET | Freq: Every day | ORAL | Status: DC
  Administered 2020-11-26 – 2020-11-29 (×4): 40 mg via ORAL
  Filled 2020-11-25 (×5): qty 1

## 2020-11-25 MED ORDER — LACTATED RINGERS IV SOLN: INTRAVENOUS | Status: DC

## 2020-11-25 MED ORDER — LACTATED RINGERS IV BOLUS
1000.0000 mL | Freq: Once | INTRAVENOUS | Status: AC
  Administered 2020-11-25: 1000 mL via INTRAVENOUS

## 2020-11-25 NOTE — Progress Notes (Signed)
*  PRELIMINARY RESULTS* Echocardiogram 2D Echocardiogram has been performed.  Shawn [name] 11/25/2020, 2:55 PM

## 2020-11-25 NOTE — Progress Notes (Signed)
Paged Dr Charleen Kirks regarding critical value lactic 2.3.
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
## 2020-11-25 NOTE — Progress Notes (Signed)
Subjective:   Shawn [name] states that he is not having any pain at this time.  He is feeling better than he was yesterday. We discussed his results of bacteremia and he expresses understanding, but he is worried about how he became infected in the first place.  We did discuss that his sources are likely secondary to urinary and his wounds.  He expressed understanding.  Objective:  Vital signs in last 24 hours: Vitals:   11/25/20 0334 11/25/20 0428 11/25/20 0527 11/25/20 0640  BP: 133/74 95/64 96/68  99/65  Pulse: (!) 140 (!) 135 (!) 120 (!) 113  Resp: 20 20 20 18   Temp: (!) 103 F (39.4 C) (!) 100.6 F (38.1 C) 99.6 F (37.6 C) 98.4 F (36.9 C)  TempSrc:      SpO2: 96% 94% 93% 92%  Weight:      Height:       Physical Exam Vitals and nursing note reviewed.  Constitutional:      Appearance: He is not ill-appearing.  Musculoskeletal:     Right Lower Extremity: Right leg is amputated above knee.  Skin:    General: Skin is warm and dry.     Comments: Significant decubitus sacral ulcers, stage 4, with bone exposed on the right ischial region.  Superior aspect has poor wound base, however the majority of the ulcer appears healthy.  Significant irritation of the inferior aspect of the scrotum with some bloody oozing.  Please see pictures below.   Neurological:     Mental Status: He is alert and oriented to person, place, and time. Mental status is at baseline.  Psychiatric:        Mood and Affect: Mood normal.        Behavior: Behavior normal.        Assessment/Plan:  Active Problems:   UTI (urinary tract infection)   Pressure injury of skin  # Sepsis  # MRSA and ESBL E. Coli Bacteremia # Chronic Osteomyelitis 3/3 blood cultures positive for both pathogens.  Culture thus far is also positive for E. coli, so likely source of the ESBL E. coli is from the urine.  However I am concerned his MRSA source is from his sacral ulcer.  TTE was obtained and negative for evidence of  endocarditis; will consider TEE based off IDs recommendations.  Will obtain further imaging of his pelvis due to large ulcer to determine any areas of acute on chronic osteomyelitis and if there is any areas requiring surgical intervention.  - ID consulted; appreciate their recommendations - Cefepime discontinued  - Continue Meropenem and Vancomycin, per pharmacy dosing (Day 1)  - Repeat blood cultures tomorrow  - Consider TEE, will discuss with ID - MRI pelvis w/wo contrast pending  - Tylenol PRN for fever  - Wet to dry dressing with Santyl applied PRN - Continue IVF   # AKI  Likely prerenal due to acute illness.  Renal function has improved with IV fluids.  - Continue IVF   # Hx of HTN Blood pressures have been low due to sepsis.   - Continue holding home anti-hypertensives   # T2DM - SSI   # Facial Swelling Resolved.  No further intervention at this time.  # Atopic Dermatitis  - Continue Kenalog cream as needed  Prior to Admission Living Arrangement: Home  Anticipated Discharge Location: TBD Barriers to Discharge: Pending further medical stabilization   Dr. Jose Persia Internal Medicine PGY-2  Pager: [phone] After 5pm on weekdays and 1pm on  weekends: On Call pager [phone]  11/25/2020, 7:23 AM

## 2020-11-25 NOTE — Progress Notes (Signed)
   11/25/20 0334  Assess: MEWS Score  Temp (!) 103 F (39.4 C)  BP 133/74  Pulse Rate (!) 140  Resp 20  Level of Consciousness Alert  SpO2 96 %  O2 Device Room Air  Assess: MEWS Score  MEWS Temp 2  MEWS Systolic 0  MEWS Pulse 3  MEWS RR 0  MEWS LOC 0  MEWS Score 5  MEWS Score Color Red  Assess: if the MEWS score is Yellow or Red  Were vital signs taken at a resting state? Yes  Focused Assessment Change from prior assessment (see assessment flowsheet)  Early Detection of Sepsis Score *See Row Information* Low  MEWS guidelines implemented *See Row Information* Yes  Treat  MEWS Interventions Escalated (See documentation below);Administered prn meds/treatments  Pain Scale 0-10  Pain Score 0  Take Vital Signs  Increase Vital Sign Frequency  Red: Q 1hr X 4 then Q 4hr X 4, if remains red, continue Q 4hrs  Escalate  MEWS: Escalate Red: discuss with charge nurse/RN and provider, consider discussing with RRT  Notify: Charge Nurse/RN  Name of Charge Nurse/RN Notified Charito B RN  Date Charge Nurse/RN Notified 11/25/20  Time Charge Nurse/RN Notified [phone]  MD on call paged

## 2020-11-26 DIAGNOSIS — N390 Urinary tract infection, site not specified: Secondary | ICD-10-CM

## 2020-11-26 LAB — CBC WITH DIFFERENTIAL/PLATELET
Abs Immature Granulocytes: 0.08 10*3/uL — ABNORMAL HIGH (ref 0.00–0.07)
Basophils Absolute: 0 10*3/uL (ref 0.0–0.1)
Basophils Relative: 0 %
Eosinophils Absolute: 0.3 10*3/uL (ref 0.0–0.5)
Eosinophils Relative: 3 %
HCT: 25.5 % — ABNORMAL LOW (ref 39.0–52.0)
Hemoglobin: 8.3 g/dL — ABNORMAL LOW (ref 13.0–17.0)
Immature Granulocytes: 1 %
Lymphocytes Relative: 9 %
Lymphs Abs: 0.8 10*3/uL (ref 0.7–4.0)
MCH: 21.2 pg — ABNORMAL LOW (ref 26.0–34.0)
MCHC: 32.5 g/dL (ref 30.0–36.0)
MCV: 65.2 fL — ABNORMAL LOW (ref 80.0–100.0)
Monocytes Absolute: 0.5 10*3/uL (ref 0.1–1.0)
Monocytes Relative: 5 %
Neutro Abs: 7.4 10*3/uL (ref 1.7–7.7)
Neutrophils Relative %: 82 %
Platelets: 189 10*3/uL (ref 150–400)
RBC: 3.91 MIL/uL — ABNORMAL LOW (ref 4.22–5.81)
RDW: 20.9 % — ABNORMAL HIGH (ref 11.5–15.5)
WBC: 9.1 10*3/uL (ref 4.0–10.5)
nRBC: 0 % (ref 0.0–0.2)

## 2020-11-26 LAB — HEPATITIS C ANTIBODY: HCV Ab: NONREACTIVE

## 2020-11-26 LAB — BASIC METABOLIC PANEL
Anion gap: 9 (ref 5–15)
BUN: 16 mg/dL (ref 8–23)
CO2: 21 mmol/L — ABNORMAL LOW (ref 22–32)
Calcium: 7.5 mg/dL — ABNORMAL LOW (ref 8.9–10.3)
Chloride: 107 mmol/L (ref 98–111)
Creatinine, Ser: 0.69 mg/dL (ref 0.61–1.24)
GFR, Estimated: >60 mL/min (ref >60)
Glucose, Bld: 97 mg/dL (ref 70–99)
Potassium: 3.3 mmol/L — ABNORMAL LOW (ref 3.5–5.1)
Sodium: 137 mmol/L (ref 135–145)

## 2020-11-26 LAB — GLUCOSE, CAPILLARY
Glucose-Capillary: 83 mg/dL (ref 70–99)
Glucose-Capillary: 108 mg/dL — ABNORMAL HIGH (ref 70–99)
Glucose-Capillary: 105 mg/dL — ABNORMAL HIGH (ref 70–99)

## 2020-11-26 LAB — C-REACTIVE PROTEIN: CRP: 22.1 mg/dL — ABNORMAL HIGH (ref <1.0)

## 2020-11-26 LAB — SEDIMENTATION RATE: Sed Rate: 52 mm/hr — ABNORMAL HIGH (ref 0–16)

## 2020-11-26 LAB — HEPATITIS B SURFACE ANTIGEN: Hepatitis B Surface Ag: NONREACTIVE

## 2020-11-26 MED ORDER — POTASSIUM CHLORIDE CRYS ER 20 MEQ PO TBCR
40.0000 meq | EXTENDED_RELEASE_TABLET | Freq: Once | ORAL | Status: AC
  Administered 2020-11-26: 40 meq via ORAL
  Filled 2020-11-26: qty 2

## 2020-11-26 MED ORDER — TRIAMCINOLONE ACETONIDE 0.1 % EX LOTN
TOPICAL_LOTION | Freq: Three times a day (TID) | CUTANEOUS | Status: DC
  Filled 2020-11-26 (×3): qty 60

## 2020-11-26 MED ORDER — TRIAMCINOLONE ACETONIDE 0.1 % EX LOTN: TOPICAL_LOTION | Freq: Three times a day (TID) | CUTANEOUS | Status: DC

## 2020-11-26 MED ORDER — CLOTRIMAZOLE 1 % EX CREA
TOPICAL_CREAM | Freq: Two times a day (BID) | CUTANEOUS | Status: DC
  Filled 2020-11-26: qty 15

## 2020-11-26 MED ORDER — SODIUM CHLORIDE 0.9% FLUSH
10.0000 mL | INTRAVENOUS | Status: DC | PRN
  Administered 2020-11-28: 10 mL

## 2020-11-26 MED ORDER — SODIUM CHLORIDE 0.9% FLUSH
10.0000 mL | Freq: Two times a day (BID) | INTRAVENOUS | Status: DC
  Administered 2020-11-27 – 2020-11-28 (×2): 10 mL
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
## 2020-11-26 NOTE — Plan of Care (Signed)
  Problem: Education: Goal: Knowledge of General Education information will improve Description Including pain rating scale, medication(s)/side effects and non-pharmacologic comfort measures Outcome: Progressing   

## 2020-11-26 NOTE — Plan of Care (Signed)
  Problem: Activity: Goal: Risk for activity intolerance will decrease Outcome: Progressing   Problem: Nutrition: Goal: Adequate nutrition will be maintained Outcome: Progressing   

## 2020-11-26 NOTE — Progress Notes (Signed)
° °  Subjective:   Shawn [name] states he is feeling better today compared to yesterday. He is having some pain at his ulcer right on the right buttock but notes it is unchanged from previously. He is also having pain in his bilateral legs but this is unchanged as well. He notes that the rashes on his skin seem to be getting better. At home, he uses an antifungal cream on his genital area as he was told it is a fungal infection.   Objective:  Vital signs in last 24 hours: Vitals:   11/25/20 1623 11/25/20 2108 11/26/20 0107 11/26/20 0541  BP: 132/80 121/83 111/73 (!) 152/89  Pulse: 95 (!) 104 93 87  Resp: 14 18 18 18   Temp: 98.5 F (36.9 C) 99.3 F (37.4 C) 99 F (37.2 C) 98.4 F (36.9 C)  TempSrc:  Oral  Oral  SpO2: 99% 98% 95% 100%  Weight:      Height:       Physical Exam Vitals and nursing note reviewed.  Constitutional:      Appearance: He is not ill-appearing.  Musculoskeletal:     Right Lower Extremity: Right leg is amputated above knee.  Skin:    General: Skin is warm and dry.     Comments: Diffuse rashes on bilateral shoulders, chest and abdomen that are non-erythematous, irregular and flat. On the genital region and bilateral tights on the lateral aspect, erythematous, very dry rash.   Neurological:     Mental Status: He is alert and oriented to person, place, and time. Mental status is at baseline.  Psychiatric:        Mood and Affect: Mood normal.        Behavior: Behavior normal.    Assessment/Plan:  Active Problems:   UTI (urinary tract infection)   Pressure injury of skin  # Sepsis  # MRSA and ESBL E. Coli Bacteremia # Chronic Osteomyelitis 3/3 blood cultures positive for both pathogens. I am concerned his MRSA source is from his sacral ulcer. MRI of pelvis still pending. TTE was obtained and negative for evidence of endocarditis. Will hold off on TEE per ID recommendations.  - ID consulted; appreciate their recommendations - Continue Meropenem and  Vancomycin, per pharmacy dosing - Repeat blood cultures tomorrow  - MRI pelvis w/wo contrast pending  - Tylenol PRN for fever  - Wet to dry dressing with Santyl applied PRN  # Acute on Chronic Microcytic Anemia Likely acute blood loss from deep sacral wounds.   - Continue to monitor - Transfuse if < 7.0   # Hx of HTN Blood pressures improving but still normotensive now.   - Continue holding home anti-hypertensives   # T2DM - SSI   # Atopic Dermatitis  - Continue Kenalog cream as needed  # Genital Candidiasis  - Clotrimazole cream   # AKI: Resolved # Facial Swelling: Resolved  Prior to Admission Living Arrangement: Home  Anticipated Discharge Location: TBD Barriers to Discharge: Pending further medical stabilization   Dr. Jose Persia Internal Medicine PGY-2  Pager: [phone] After 5pm on weekdays and 1pm on weekends: On Call pager [phone]  11/26/2020, 7:02 AM

## 2020-11-27 DIAGNOSIS — N179 Acute kidney failure, unspecified: Secondary | ICD-10-CM

## 2020-11-27 DIAGNOSIS — L89154 Pressure ulcer of sacral region, stage 4: Secondary | ICD-10-CM

## 2020-11-27 DIAGNOSIS — L209 Atopic dermatitis, unspecified: Secondary | ICD-10-CM

## 2020-11-27 DIAGNOSIS — Z1612 Extended spectrum beta lactamase (ESBL) resistance: Secondary | ICD-10-CM

## 2020-11-27 DIAGNOSIS — B3789 Other sites of candidiasis: Secondary | ICD-10-CM

## 2020-11-27 DIAGNOSIS — B9620 Unspecified Escherichia coli [E. coli] as the cause of diseases classified elsewhere: Secondary | ICD-10-CM

## 2020-11-27 DIAGNOSIS — M8610 Other acute osteomyelitis, unspecified site: Secondary | ICD-10-CM

## 2020-11-27 DIAGNOSIS — B9629 Other Escherichia coli [E. coli] as the cause of diseases classified elsewhere: Secondary | ICD-10-CM

## 2020-11-27 DIAGNOSIS — M8619 Other acute osteomyelitis, multiple sites: Secondary | ICD-10-CM

## 2020-11-27 DIAGNOSIS — D62 Acute posthemorrhagic anemia: Secondary | ICD-10-CM

## 2020-11-27 DIAGNOSIS — E1159 Type 2 diabetes mellitus with other circulatory complications: Secondary | ICD-10-CM

## 2020-11-27 DIAGNOSIS — M868X0 Other osteomyelitis, multiple sites: Secondary | ICD-10-CM

## 2020-11-27 DIAGNOSIS — A499 Bacterial infection, unspecified: Secondary | ICD-10-CM

## 2020-11-27 DIAGNOSIS — I10 Essential (primary) hypertension: Secondary | ICD-10-CM

## 2020-11-27 LAB — CBC WITH DIFFERENTIAL/PLATELET
Abs Immature Granulocytes: 0.04 10*3/uL (ref 0.00–0.07)
Basophils Absolute: 0 10*3/uL (ref 0.0–0.1)
Basophils Relative: 1 %
Eosinophils Absolute: 0.5 10*3/uL (ref 0.0–0.5)
Eosinophils Relative: 9 %
HCT: 29.5 % — ABNORMAL LOW (ref 39.0–52.0)
Hemoglobin: 9 g/dL — ABNORMAL LOW (ref 13.0–17.0)
Immature Granulocytes: 1 %
Lymphocytes Relative: 16 %
Lymphs Abs: 0.9 10*3/uL (ref 0.7–4.0)
MCH: 20.4 pg — ABNORMAL LOW (ref 26.0–34.0)
MCHC: 30.5 g/dL (ref 30.0–36.0)
MCV: 66.7 fL — ABNORMAL LOW (ref 80.0–100.0)
Monocytes Absolute: 0.6 10*3/uL (ref 0.1–1.0)
Monocytes Relative: 11 %
Neutro Abs: 3.6 10*3/uL (ref 1.7–7.7)
Neutrophils Relative %: 62 %
Platelets: 196 10*3/uL (ref 150–400)
RBC: 4.42 MIL/uL (ref 4.22–5.81)
RDW: 21.2 % — ABNORMAL HIGH (ref 11.5–15.5)
WBC: 5.7 10*3/uL (ref 4.0–10.5)
nRBC: 0 % (ref 0.0–0.2)

## 2020-11-27 LAB — COMPREHENSIVE METABOLIC PANEL
ALT: 11 U/L (ref 0–44)
AST: 11 U/L — ABNORMAL LOW (ref 15–41)
Albumin: 2.4 g/dL — ABNORMAL LOW (ref 3.5–5.0)
Alkaline Phosphatase: 56 U/L (ref 38–126)
Anion gap: 9 (ref 5–15)
BUN: 10 mg/dL (ref 8–23)
CO2: 24 mmol/L (ref 22–32)
Calcium: 8.1 mg/dL — ABNORMAL LOW (ref 8.9–10.3)
Chloride: 108 mmol/L (ref 98–111)
Creatinine, Ser: 0.68 mg/dL (ref 0.61–1.24)
GFR, Estimated: >60 mL/min (ref >60)
Glucose, Bld: 102 mg/dL — ABNORMAL HIGH (ref 70–99)
Potassium: 3.6 mmol/L (ref 3.5–5.1)
Sodium: 141 mmol/L (ref 135–145)
Total Bilirubin: 0.4 mg/dL (ref 0.3–1.2)
Total Protein: 6 g/dL — ABNORMAL LOW (ref 6.5–8.1)

## 2020-11-27 LAB — VANCOMYCIN, TROUGH: Vancomycin Tr: 25 ug/mL (ref 15–20)

## 2020-11-27 LAB — GLUCOSE, CAPILLARY
Glucose-Capillary: 101 mg/dL — ABNORMAL HIGH (ref 70–99)
Glucose-Capillary: 118 mg/dL — ABNORMAL HIGH (ref 70–99)
Glucose-Capillary: 119 mg/dL — ABNORMAL HIGH (ref 70–99)

## 2020-11-27 LAB — HEPATITIS B SURFACE ANTIBODY, QUANTITATIVE: Hep B S AB Quant (Post): 20 m[IU]/mL (ref >9.9)

## 2020-11-27 MED ORDER — METHOCARBAMOL 500 MG PO TABS
500.0000 mg | ORAL_TABLET | Freq: Two times a day (BID) | ORAL | Status: DC | PRN
  Administered 2020-11-27 – 2020-11-29 (×4): 500 mg via ORAL
  Filled 2020-11-27 (×4): qty 1

## 2020-11-27 MED ORDER — AMLODIPINE BESYLATE 10 MG PO TABS
10.0000 mg | ORAL_TABLET | Freq: Every day | ORAL | Status: DC
  Administered 2020-11-27 – 2020-11-29 (×3): 10 mg via ORAL
  Filled 2020-11-27 (×3): qty 1

## 2020-11-27 MED ORDER — HYDROCODONE-ACETAMINOPHEN 5-325 MG PO TABS
1.0000 | ORAL_TABLET | Freq: Four times a day (QID) | ORAL | Status: DC | PRN
  Administered 2020-11-27 – 2020-11-29 (×9): 2 via ORAL
  Filled 2020-11-27 (×9): qty 2

## 2020-11-27 NOTE — Progress Notes (Signed)
Pharmacy Antibiotic Note  Shawn [name]. is a 69 y.o. male admitted on 11/23/2020 with MRSA and ESBL E.coli bacteremia.  Pharmacy has been consulted for meropenem and vancomycin dosing. Unable to get vancomycin level today. Will f/u in AM for trough. Patient being seen by ID and may transition to daptomycin depending on coverage per ID note.   Plan: Meropenem 2g IV every 8 hours Vancomycin 1000mg  IV q12h Vancomycin trough on 12/21 Monitor renal function, Cx and clinical progression to narrow  Height: 6' 1.5" (186.7 cm) Weight: 77.1 kg (170 lb) IBW/kg (Calculated) : 81.05  Temp (24hrs), Avg:98.6 F (37 C), Min:98.1 F (36.7 C), Max:98.9 F (37.2 C)  Recent Labs  Lab 11/24/20 0435 11/24/20 1540 11/24/20 1926 11/24/20 2129 11/25/20 0243 11/25/20 0827 11/25/20 1029 11/26/20 0136 11/27/20 0702  WBC 14.6*  --   --   --  4.8  --   --  9.1 5.7  CREATININE 1.06  --   --   --  0.97  --   --  0.69 0.68  LATICACIDVEN 3.2* 3.0* 1.9 2.1*  --  2.3* 6.2*  --   --     Estimated Creatinine Clearance: 92.4 mL/min (by C-G formula based on SCr of 0.68 mg/dL).    Allergies  Allergen Reactions  . Penicillins Hives    Tolerated cefazolin June 2021  . Statins     Uncontrolled diarrhea    [name] A. Levada Dy, PharmD, BCPS, FNKF Clinical Pharmacist [place] Please utilize Amion for appropriate phone number to reach the unit pharmacist ([place])   11/27/2020 11:51 AM

## 2020-11-27 NOTE — Progress Notes (Signed)
   Subjective:   Mr. Davids states he continues to have severe pain of his sacral wound despite pain medication. He says his fevers and chills have resolved. He continues to have itching of his shoulders and abdomen but state these have improved with cream. He notes his images of his wound appear improved from prior pictures taken in the past.   Objective:  Vital signs in last 24 hours: Vitals:   11/26/20 0923 11/26/20 1641 11/26/20 2050 11/27/20 0509  BP: 136/76 (!) 142/88 (!) 151/94 (!) 144/98  Pulse: 96 88 84 76  Resp: 14 18 18 18   Temp: 98.6 F (37 C) 98.1 F (36.7 C) 98.8 F (37.1 C) 98.9 F (37.2 C)  TempSrc:  Oral Oral   SpO2: 97% 95% 95% 97%  Weight:      Height:       General: Patient is resting comfortably in no acute distress.  Cardiology: RRR. No murmurs, rubs or gallops.  Respiratory: No increased work of breathing or tachypnea on room air.  Neurological: Awake and Alert.  Skin: Erythematous, scaly plaques on right shoulder, abdomen, and groin have significantly improved.   Assessment/Plan:  Active Problems:   UTI (urinary tract infection)   Pressure injury of skin  # Sepsis  # MRSA and ESBL E. Coli Bacteremia # Acute on Chronic Osteomyelitis 3/3 blood cultures positive for both pathogens. I am concerned his MRSA source is from his sacral ulcer. MR pelvis shows bone marrow edema consistent with acute osteomyelitis of the R ischium and possibly of the first coccygeal segment of the sacrum. TTE was obtained and negative for evidence of endocarditis. Will hold off on TEE per ID recommendations.  - ID consulted; appreciate their recommendations - Spoke with general surgery who feel that debridement is not necessary  - Continue with medical management: Meropenem and Vancomycin, per pharmacy dosing, total of 6 weeks  - Will repeat blood cultures  - Patient will need midline exchanged for PICC line given need for prolonged ABX, likely tomorrow  - Tylenol PRN for fever  and mild-moderate pain - NORCO/Vicodin 5-325mg  1-2 tablets q6 hours PRN for breakthrough pain  - Wet to dry dressing with Santyl applied PRN  # Acute on Chronic Microcytic Anemia, Stable  Likely acute blood loss from deep sacral wounds; however, does have significant microcytosis with target cells and ovalocytes concerning for thalassemia.   - Would benefit from outpatient iron studies / thalassemia workup - Continue to monitor for now in setting of infection - Transfuse if < 7.0   # Hypertension Blood pressures more elevated today.   - Start home amlodipine 10mg  daily   # T2DM - SSI   # Atopic Dermatitis  - Continue Kenalog cream as needed  # Genital Candidiasis  - Clotrimazole cream   # AKI: Resolved # Facial Swelling: Resolved  Prior to Admission Living Arrangement: Home  Anticipated Discharge Location: TBD Barriers to Discharge: Pending further medical stabilization   Dr. Jeralyn Bennett Internal Medicine PGY-1 Pager: [phone] After 5pm on weekdays and 1pm on weekends: On Call pager [phone]  11/27/2020, 12:49 PM

## 2020-11-27 NOTE — Plan of Care (Signed)
  Problem: Nutrition: Goal: Adequate nutrition will be maintained Outcome: Progressing   Problem: Pain Managment: Goal: General experience of comfort will improve Outcome: Progressing   Problem: Skin Integrity: Goal: Risk for impaired skin integrity will decrease Outcome: Progressing   

## 2020-11-27 NOTE — Plan of Care (Signed)
  Problem: Education: Goal: Knowledge of General Education information will improve Description Including pain rating scale, medication(s)/side effects and non-pharmacologic comfort measures Outcome: Progressing   
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
## 2020-11-28 DIAGNOSIS — B964 Proteus (mirabilis) (morganii) as the cause of diseases classified elsewhere: Secondary | ICD-10-CM

## 2020-11-28 LAB — BASIC METABOLIC PANEL
Anion gap: 10 (ref 5–15)
BUN: 11 mg/dL (ref 8–23)
CO2: 24 mmol/L (ref 22–32)
Calcium: 8.1 mg/dL — ABNORMAL LOW (ref 8.9–10.3)
Chloride: 104 mmol/L (ref 98–111)
Creatinine, Ser: 0.67 mg/dL (ref 0.61–1.24)
GFR, Estimated: >60 mL/min (ref >60)
Glucose, Bld: 90 mg/dL (ref 70–99)
Potassium: 3.7 mmol/L (ref 3.5–5.1)
Sodium: 138 mmol/L (ref 135–145)

## 2020-11-28 LAB — CULTURE, BLOOD (ROUTINE X 2): Special Requests: ADEQUATE

## 2020-11-28 LAB — CBC
HCT: 28.3 % — ABNORMAL LOW (ref 39.0–52.0)
Hemoglobin: 8.8 g/dL — ABNORMAL LOW (ref 13.0–17.0)
MCH: 21 pg — ABNORMAL LOW (ref 26.0–34.0)
MCHC: 31.1 g/dL (ref 30.0–36.0)
MCV: 67.5 fL — ABNORMAL LOW (ref 80.0–100.0)
Platelets: 167 10*3/uL (ref 150–400)
RBC: 4.19 MIL/uL — ABNORMAL LOW (ref 4.22–5.81)
RDW: 21.2 % — ABNORMAL HIGH (ref 11.5–15.5)
WBC: 4.5 10*3/uL (ref 4.0–10.5)
nRBC: 0 % (ref 0.0–0.2)

## 2020-11-28 LAB — GLUCOSE, CAPILLARY
Glucose-Capillary: 93 mg/dL (ref 70–99)
Glucose-Capillary: 129 mg/dL — ABNORMAL HIGH (ref 70–99)
Glucose-Capillary: 111 mg/dL — ABNORMAL HIGH (ref 70–99)
Glucose-Capillary: 135 mg/dL — ABNORMAL HIGH (ref 70–99)

## 2020-11-28 LAB — URINE CULTURE: Culture: >=100000 — AB

## 2020-11-28 LAB — VANCOMYCIN, TROUGH: Vancomycin Tr: 27 ug/mL (ref 15–20)

## 2020-11-28 MED ORDER — GABAPENTIN 600 MG PO TABS: 600.0000 mg | ORAL_TABLET | Freq: Two times a day (BID) | ORAL | Status: DC

## 2020-11-28 MED ORDER — VANCOMYCIN HCL 1250 MG/250ML IV SOLN
1250.0000 mg | INTRAVENOUS | Status: DC
  Administered 2020-11-29: 1250 mg via INTRAVENOUS
  Filled 2020-11-28 (×2): qty 250

## 2020-11-28 MED ORDER — SODIUM CHLORIDE 0.9 % IV SOLN
1.0000 g | INTRAVENOUS | Status: DC
  Administered 2020-11-28 – 2020-11-29 (×2): 1000 mg via INTRAVENOUS
  Filled 2020-11-28 (×2): qty 1

## 2020-11-28 MED ORDER — GABAPENTIN 400 MG PO CAPS
400.0000 mg | ORAL_CAPSULE | Freq: Two times a day (BID) | ORAL | Status: DC
  Administered 2020-11-28 – 2020-11-29 (×2): 400 mg via ORAL
  Filled 2020-11-28 (×2): qty 1

## 2020-11-28 NOTE — Plan of Care (Signed)
  Problem: Education: Goal: Knowledge of General Education information will improve Description: Including pain rating scale, medication(s)/side effects and non-pharmacologic comfort measures Outcome: Progressing   Problem: Pain Managment: Goal: General experience of comfort will improve Outcome: Progressing   

## 2020-11-28 NOTE — Progress Notes (Signed)
   Subjective:   Shawn [name] states that his pain is under better control today and is tolerable. He endorses shooting pains into his left leg, worsened since hospitalization. Denies fevers, chills, urinary or bowel changes, nausea, abdominal pain, flank pain.  Objective:  Vital signs in last 24 hours: Vitals:   11/27/20 2114 11/27/20 2358 11/28/20 0549 11/28/20 1021  BP: (!) 170/94 (!) 152/92 (!) 146/89 (!) 145/87  Pulse: 97 82 73 78  Resp: 18   14  Temp: 97.6 F (36.4 C)  97.7 F (36.5 C) 97.8 F (36.6 C)  TempSrc: Oral  Oral Oral  SpO2: 99%  98% 95%  Weight:      Height:       General: Patient is resting comfortably in no acute distress.  Cardiology: RRR. No murmurs, rubs or gallops.  Respiratory: No increased work of breathing or tachypnea on room air.  Abdominal: Distended but soft without tenderness to palpation. Bowel sounds intact.  Neurological: Awake and Alert.   Assessment/Plan:  Principal Problem:   Bacteremia Active Problems:   UTI (urinary tract infection)   Pressure injury of skin   Acute osteomyelitis (HCC)   ESBL (extended spectrum beta-lactamase) producing bacteria infection  # Sepsis  # MRSA, Providencia Stuartii, Proteus Mirabilis, ESBL E. Coli Bacteremia # Acute on Chronic Osteomyelitis # Urinary Tract Infection MR pelvis shows bone marrow edema consistent with acute osteomyelitis of the R ischium and possibly of the first coccygeal segment of the sacrum. Blood cultures positive for above organisms. Urinalysis consistent with UTI and urine cultures positive for ESBL E. Coli and Providencia Stuartii. Source of sepsis likely urinary and due to sacral wounds. TTE was obtained and negative for evidence of endocarditis. Will hold off on TEE per ID recommendations.  - ID consulted; appreciate their recommendations - Spoke with general surgery who feel that debridement is not necessary  - Continue with medical management: Meropenem and Vancomycin, per pharmacy  dosing, total of 6 weeks  - Consider switching to Ertapenem if cost is not an issue, per ID  - Repeat cultures x 2 negative < 24 hours - Will wait until repeat cultures negative x 48 hours prior to switching midline to PICC in RUE for prolonged ABX - Tylenol PRN for fever and mild-moderate pain - NORCO/Vicodin 5-325mg  1-2 tablets q6 hours PRN for breakthrough pain - Wet to dry dressing with Santyl applied PRN - Patient prefers to go home with home ABX  # Acute on Chronic Microcytic Anemia, Stable  Likely acute blood loss from deep sacral wounds; however, does have significant microcytosis with target cells and ovalocytes concerning for thalassemia.   - May benefit from outpatient iron studies / thalassemia workup - No immediate management necessary  - Transfuse if Hgb < 7.0   # Hypertension Blood pressures remain elevated on home amlodipine 10mg  daily started yesterday.  - Continue to monitor for now - May require additional anti-HTN for discharge  # T2DM - SSI   # Atopic Dermatitis  - Continue Kenalog cream as needed  # Genital Candidiasis  - Clotrimazole cream   # AKI: Resolved # Facial Swelling: Resolved  Prior to Admission Living Arrangement: Home  Anticipated Discharge Location: Home Barriers to Discharge: PICC line placement  Dr. Jeralyn Bennett Internal Medicine PGY-1 Pager: [phone] After 5pm on weekdays and 1pm on weekends: On Call pager [phone]  11/28/2020, 3:13 PM

## 2020-11-28 NOTE — Progress Notes (Signed)
Diagnosis: Sacral Osteomyelitis   Culture Result: ESBL E coli, Providencia stuartii, Proteus mirabilis. MRSA  Allergies  Allergen Reactions  . Penicillins Hives    Tolerated cefazolin June 2021  . Statins     Uncontrolled diarrhea    OPAT Orders Discharge antibiotics to be given via PICC line Discharge antibiotics: Vancomycin and Meropenem  Per pharmacy protocol Aim for Vancomycin trough 15-20 or AUC 400-550 (unless otherwise indicated) Duration: 6 weeks  End Date: Jan 09, 2020  [place] Per Protocol:  Home health RN for IV administration and teaching; PICC line care and labs.    Labs weekly while on IV antibiotics: X__ CBC with differential X__ BMP __ CMP X_ CRP X__ ESR X__ Vancomycin trough __ CK  X__ Please pull PIC at completion of IV antibiotics __ Please leave PIC in place until doctor has seen patient or been notified  Fax weekly labs to [phone]  Clinic Follow Up Appt: Dr West Bali  12/15/2020  @ 2: 45 pm
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
## 2020-11-28 NOTE — Progress Notes (Signed)
Pharmacy Antibiotic Note  Shawn [name]. is a 69 y.o. male admitted on 11/23/2020 with MRSA and ESBL E.coli bacteremia.  Pharmacy has been consulted for  vancomycin dosing. Unable to get vancomycin level 12/20. Trough drawn at 13 hours post dose= 27 mcg/ml (goal 15-20 mcg/ml). Patient to discharge on vancomycin and ertapenem. Will decrease frequency to q24h with next dose 24 hours from last dose. Renal function stable.   Plan: Decrease Vancomycin to 1250mg  IV q24h Monitor renal function, Cx and clinical progression to narrow  Height: 6' 1.5" (186.7 cm) Weight: 77.1 kg (170 lb) IBW/kg (Calculated) : 81.05  Temp (24hrs), Avg:97.9 F (36.6 C), Min:97.6 F (36.4 C), Max:98.5 F (36.9 C)  Recent Labs  Lab 11/24/20 0435 11/24/20 1540 11/24/20 1926 11/24/20 2129 11/25/20 0243 11/25/20 0827 11/25/20 1029 11/26/20 0136 11/27/20 0702 11/27/20 1222 11/28/20 0433 11/28/20 1228  WBC 14.6*  --   --   --  4.8  --   --  9.1 5.7  --  4.5  --   CREATININE 1.06  --   --   --  0.97  --   --  0.69 0.68  --  0.67  --   LATICACIDVEN 3.2* 3.0* 1.9 2.1*  --  2.3* 6.2*  --   --   --   --   --   VANCOTROUGH  --   --   --   --   --   --   --   --   --  25*  --  27*    Estimated Creatinine Clearance: 92.4 mL/min (by C-G formula based on SCr of 0.67 mg/dL).    Allergies  Allergen Reactions  . Penicillins Hives    Tolerated cefazolin June 2021  . Statins     Uncontrolled diarrhea    [place] A. Levada Dy, PharmD, BCPS, FNKF Clinical Pharmacist [place] Please utilize Amion for appropriate phone number to reach the unit pharmacist ([place])   11/28/2020 2:42 PM

## 2020-11-28 NOTE — Progress Notes (Signed)
PHARMACY CONSULT NOTE FOR:  OUTPATIENT  PARENTERAL ANTIBIOTIC THERAPY (OPAT)  Indication: Sacral osteomyelitis  Regimen: Vancomycin IV 1000 mg every 12 hours + Ertapenem 1 gm Q 24 hours  End date: 01/08/21  Ertapenem is more affordable than meropenem for patient. Discussed with Dr. West Bali.   IV antibiotic discharge orders are pended. To discharging provider:  please sign these orders via discharge navigator,  Select New Orders & click on the button choice - Manage This Unsigned Work.     Thank you for allowing pharmacy to be a part of this patient's care.  Jimmy Footman, PharmD, BCPS, BCIDP Infectious Diseases Clinical Pharmacist Phone: [phone] 11/28/2020, 11:30 AM

## 2020-11-29 ENCOUNTER — Inpatient Hospital Stay: Payer: Self-pay

## 2020-11-29 ENCOUNTER — Other Ambulatory Visit (HOSPITAL_COMMUNITY): Payer: Self-pay | Admitting: Student

## 2020-11-29 LAB — GLUCOSE, CAPILLARY
Glucose-Capillary: 97 mg/dL (ref 70–99)
Glucose-Capillary: 116 mg/dL — ABNORMAL HIGH (ref 70–99)
Glucose-Capillary: 127 mg/dL — ABNORMAL HIGH (ref 70–99)

## 2020-11-29 LAB — CBC
HCT: 29.1 % — ABNORMAL LOW (ref 39.0–52.0)
Hemoglobin: 9.1 g/dL — ABNORMAL LOW (ref 13.0–17.0)
MCH: 21 pg — ABNORMAL LOW (ref 26.0–34.0)
MCHC: 31.3 g/dL (ref 30.0–36.0)
MCV: 67.1 fL — ABNORMAL LOW (ref 80.0–100.0)
Platelets: 258 10*3/uL (ref 150–400)
RBC: 4.34 MIL/uL (ref 4.22–5.81)
RDW: 20.9 % — ABNORMAL HIGH (ref 11.5–15.5)
WBC: 4.4 10*3/uL (ref 4.0–10.5)
nRBC: 0 % (ref 0.0–0.2)

## 2020-11-29 LAB — BASIC METABOLIC PANEL
Anion gap: 8 (ref 5–15)
BUN: 11 mg/dL (ref 8–23)
CO2: 23 mmol/L (ref 22–32)
Calcium: 8 mg/dL — ABNORMAL LOW (ref 8.9–10.3)
Chloride: 104 mmol/L (ref 98–111)
Creatinine, Ser: 0.59 mg/dL — ABNORMAL LOW (ref 0.61–1.24)
GFR, Estimated: >60 mL/min (ref >60)
Glucose, Bld: 96 mg/dL (ref 70–99)
Potassium: 3.6 mmol/L (ref 3.5–5.1)
Sodium: 135 mmol/L (ref 135–145)

## 2020-11-29 MED ORDER — HYDROCODONE-ACETAMINOPHEN 5-325 MG PO TABS: 1.0000 | ORAL_TABLET | Freq: Four times a day (QID) | ORAL | 0 refills | Status: DC | PRN

## 2020-11-29 MED ORDER — GABAPENTIN 400 MG PO CAPS: 400.0000 mg | ORAL_CAPSULE | Freq: Two times a day (BID) | ORAL | 0 refills | Status: DC

## 2020-11-29 MED ORDER — ERTAPENEM IV (FOR PTA / DISCHARGE USE ONLY): 1.0000 g | INTRAVENOUS | 0 refills | Status: AC

## 2020-11-29 MED ORDER — CLOTRIMAZOLE 1 % EX CREA: TOPICAL_CREAM | Freq: Two times a day (BID) | CUTANEOUS | 0 refills | Status: DC

## 2020-11-29 MED ORDER — SODIUM CHLORIDE 0.9% FLUSH: 10.0000 mL | INTRAVENOUS | Status: DC | PRN

## 2020-11-29 MED ORDER — SODIUM CHLORIDE 0.9% FLUSH: 10.0000 mL | Freq: Two times a day (BID) | INTRAVENOUS | Status: DC

## 2020-11-29 MED ORDER — VANCOMYCIN IV (FOR PTA / DISCHARGE USE ONLY): 1000.0000 mg | Freq: Two times a day (BID) | INTRAVENOUS | 0 refills | Status: AC

## 2020-11-29 MED FILL — HYDROCODON-APAP 5-325: 5-325 | 3 days supply | Qty: 30 | Fill #0

## 2020-11-29 MED FILL — GABAPENTIN 400 MG CAPSULE: 400 | 30 days supply | Qty: 60 | Fill #0

## 2020-11-29 MED FILL — ANTIFUNGAL CLOTRIMAZOLE 1 %: 1 | 3 days supply | Qty: 28 | Fill #0

## 2020-11-29 NOTE — Progress Notes (Signed)
DISCHARGE NOTE HOME [name] to be discharged Home per MD order. Discussed prescriptions and follow up appointments with the patient. Prescriptions given to patient; medication list explained in detail. Patient verbalized understanding.  Skin clean, dry and intact without evidence of new skin break down, no evidence of new skin tears noted. IV catheter discontinued intact. Site without signs and symptoms of complications. Dressing and pressure applied. Pt denies pain at the site currently. No complaints noted.  Patient discharged with left upper arm PICC line due to receiving out patient antibiotics, existing Subpubic catheter and previously existing sacral wounds.   An After Visit Summary (AVS) was printed and given to the patient. Patient escorted via wheelchair, and discharged home via private auto.  Berneta Levins, RN

## 2020-11-29 NOTE — TOC Transition Note (Signed)
Transition of Care [facility]) - CM/SW Discharge Note   Patient Details  Name: Shawn [name]. MRN: [id number] Date of Birth: June 08, 1951  Transition of Care [facility] CM/SW Contact:  Bartholomew Crews, RN Phone Number: [phone] 11/29/2020, 4:20 PM   Clinical Narrative:     Spoke with patient at the bedside. PTA home with spouse. Has all needed DME. Discussed going home with IV antibiotics. Spouse has assisted with this in the past. [facility] active for nursing. Patient will need [facility] RN orders for resumption of services. MD notified of needed orders. Family to provide transport home in wheelchair accessible [place]. No further TOC needs identified.   Final next level of care: [place] Barriers to Discharge: No Barriers Identified   Patient Goals and CMS Choice Patient states their goals for this hospitalization and ongoing recovery are:: return home with family CMS Medicare.gov Compare Post Acute Care list provided to:: Patient Choice offered to / list presented to : Patient  Discharge Placement                       Discharge Plan and Services In-house Referral: NA Discharge Planning Services: CM Consult Post Acute Care Choice: Resumption of Svcs/PTA Provider          DME Arranged: N/A DME Agency: NA       HH Arranged: RN [place] Agency: [place] ([facility] Date [place]: 11/29/20 Time [place]: 1620 Representative spoke with at [place]: Ramond Marrow at [employer]; Pam at [place] Determinants of Health ([place]) Interventions     Readmission Risk Interventions No flowsheet data found.
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
## 2020-11-29 NOTE — Discharge Instructions (Signed)
Sepsis, Self Care, Adult Sepsis is a serious illness that may require intensive care in the hospital. The following information explains what you need to know in order to manage your condition after you are discharged from the hospital. What are the risks? After being treated for sepsis and discharged from the hospital, you may be at a higher risk for certain problems. These problems may be physical or mental. Physical problems:  Weakness and tiredness.  Shortness of breath.  Pain in many areas of the body.  Difficulty walking.  Dry, itchy skin.  Lack of appetite. This may lead to weight loss.  Organ failure. Mental problems:  Difficulty sleeping.  Depression.  Confusion.  Anxiety and worry caused by having gone through a bad experience (post-traumatic stress disorder,PTSD).  Low self-esteem. Follow these instructions at home: Medicines  Take over-the-counter and prescription medicines only as told by your health care provider.  If you were prescribed an antibiotic, antiviral, or antifungal medicine, take it as told by your health care provider. Do not stop taking the medicine even if you start to feel better. Eating and drinking   Eat a healthy diet that includes plenty of vegetables, fruits, whole grains, low-fat dairy products, and lean protein. Ask your health care provider if you should avoid certain foods.  Drink enough fluid to keep your urine pale yellow. Alcohol use  Do not drink alcohol if: ? Your health care provider tells you not to drink. ? You are pregnant, may be pregnant, or are planning to become pregnant.  If you drink alcohol, limit how much you use to: ? 0-1 drink a day for women. ? 0-2 drinks a day for men.  Be aware of how much alcohol is in your drink. In the [place] one drink equals one 12 oz bottle of beer (355 mL), one 5 oz glass of wine (148 mL), or one 1 oz glass of hard liquor (44 mL). Activity  Rest and gradually return to your  normal activities. Ask your health care provider what activities are safe for you.  Avoid sitting for a long time without moving. Get up to take short walks every 1-2 hours. This is important to improve blood flow and breathing. Ask for help if you feel weak or unsteady.  Try to set small, achievable goals each week, such as dressing yourself, bathing, or walking up the stairs. It may take a while to rebuild your strength.  Try to exercise regularly if you feel healthy enough to do so. Ask your health care provider what exercises are safe for you. Preventing infection   Keep your vaccinations up to date. Get the flu shot every year.  Wash your hands often using soap and water. Use hand sanitizer if soap and water are not available.  Practice good hygiene. Keep cuts clean and covered until healed. Managing stress Talk with your health care provider or counselor about ways to reduce stress. He or she may suggest:  Meditation, muscle relaxation, and breathing exercises.  Talk therapy.  Spending time on hobbies and activities that you enjoy. General instructions  Get the right amount and quality of sleep. Most adults need 7-9 hours of sleep each night. To help with sleep: ? Keep your bedroom cool and dark. ? Do not eat a heavy meal within one hour of bedtime. ? Do not drink alcohol or caffeinated drinks before bed. ? Avoid screen time, such as television, computers, tablets, or cell phones before bed.  Do not use any products  that contain nicotine or tobacco, such as cigarettes, e-cigarettes, and chewing tobacco. If you need help quitting, ask your health care provider.  Talk to trusted family members and friends about your condition. Explain your symptoms to them, and let them know that you are working with a health care provider to treat your condition. This can provide you with one way to get support and guidance.  Keep all follow-up visits as told by your health care provider. This  is important. Questions to ask your health care provider:  What physical and emotional changes do I need to report?  Do I need to have someone with me all the time?  Is it safe for me to drive? Contact a health care provider if you:  Do not feel like you are getting better or regaining strength.  Have muscle or joint pain.  Frequently feel tired.  Are having trouble coping with your recovery.  Have nightmares, or trouble falling asleep or staying asleep.  Feel sad, down, or depressed more often than not, every day for more than 2 weeks.  Have difficulty concentrating.  Feel irritable or you cry for no reason. Get help right away if you:  Have difficulty breathing.  Have a rapid or skipping heartbeat.  Become confused or disoriented.  See, hear, or feel things that do not exist (hallucinations).  Have a high fever.  Have an infection that is getting worse or not getting better.  You have thoughts of hurting yourself or others. If you ever feel like you may hurt yourself or others, or have thoughts about taking your own life, get help right away. You can go to your nearest emergency department or call:  Your local emergency services (911 in the [place]  A suicide crisis helpline, such as the National Suicide Prevention Lifeline at 1-800-273-8255. This is open 24 hours a day. Summary  Sepsis is a serious illness that may require intensive care in a hospital. You may experience long-term health effects after you are discharged from the hospital.  Try to set small, achievable goals each week, such as dressing yourself, bathing, or walking up the stairs. It may take a while to rebuild your strength.  Keep all follow-up visits as told by your health care provider. This is important.  Know what symptoms you should get help right away for. This information is not intended to replace advice given to you by your health care provider. Make sure you discuss any questions you  have with your health care provider. Document Revised: 07/03/2018 Document Reviewed: 07/03/2018 Elsevier Patient Education  2020 [employer]  

## 2020-11-29 NOTE — Care Management (Signed)
Verified that patient is active with [facility].

## 2020-11-29 NOTE — Progress Notes (Signed)
Peripherally Inserted Central Catheter Placement  The IV Nurse has discussed with the patient and/or persons authorized to consent for the patient, the purpose of this procedure and the potential benefits and risks involved with this procedure.  The benefits include less needle sticks, lab draws from the catheter, and the patient may be discharged home with the catheter. Risks include, but not limited to, infection, bleeding, blood clot (thrombus formation), and puncture of an artery; nerve damage and irregular heartbeat and possibility to perform a PICC exchange if needed/ordered by physician.  Alternatives to this procedure were also discussed.  Bard Power PICC patient education guide, fact sheet on infection prevention and patient information card has been provided to patient /or left at bedside.    PICC Placement Documentation  PICC Single Lumen 11/29/20 PICC Left Brachial 49 cm 0 cm (Active)  Indication for Insertion or Continuance of Line Home intravenous therapies (PICC only) 11/29/20 1445  Exposed Catheter (cm) 0 cm 11/29/20 1445  Site Assessment Clean;Intact;Dry 11/29/20 1445  Line Status Flushed;Saline locked;Blood return noted 11/29/20 1445  Dressing Type Transparent 11/29/20 1445  Dressing Status Clean;Dry;Intact 11/29/20 1445  Antimicrobial disc in place? Yes 11/29/20 1445  Dressing Intervention New dressing 11/29/20 1445  Dressing Change Due 12/06/20 11/29/20 1445       Shawn [name] 11/29/2020, 2:46 PM

## 2020-11-29 NOTE — Progress Notes (Signed)
   Subjective:   Shawn [name] states that he is doing well this morning. He denies significant pain of his sacrum and states the shooting pain in his left leg has resolved on higher dose of gabapentin. Continues to deny fevers, chills, abdominal pain.   Objective:  Vital signs in last 24 hours: Vitals:   11/28/20 1021 11/28/20 2139 11/29/20 0438 11/29/20 1131  BP: (!) 145/87 (!) 144/96 (!) 148/77 121/81  Pulse: 78 77 78 78  Resp: 14  18 19   Temp: 97.8 F (36.6 C) 98.2 F (36.8 C) 97.9 F (36.6 C) 98.3 F (36.8 C)  TempSrc: Oral     SpO2: 95% 98% 96% 98%  Weight:  113.4 kg    Height:       General: Patient is resting comfortably in no acute distress.  Cardiology: RRR. No murmurs, rubs or gallops.  Respiratory: No increased work of breathing or tachypnea on room air. Bibasilar lung sounds CTA, bilaterally.  Neurological: Awake, alert and oriented.  Assessment/Plan:  Principal Problem:   Bacteremia Active Problems:   UTI (urinary tract infection)   Pressure injury of skin   Acute osteomyelitis (HCC)   ESBL (extended spectrum beta-lactamase) producing bacteria infection  # Sepsis  # MRSA, Providencia Stuartii, Proteus Mirabilis, ESBL E. Coli Bacteremia # Acute on Chronic Osteomyelitis # Urinary Tract Infection MR pelvis shows bone marrow edema consistent with acute osteomyelitis of the R ischium and possibly of the first coccygeal segment of the sacrum. Blood cultures positive for above organisms. Urinalysis consistent with UTI and urine cultures positive for ESBL E. Coli and Providencia Stuartii. Source of sepsis likely urinary and due to sacral wounds. TTE was obtained and negative for evidence of endocarditis. Debridement unnecessary per General Surgery.  - ID consulted; appreciate their recommendations - Continue Vancomycin IV 1000mg  q12 hours and Ertapenem 1g daily until 01/08/21  - Repeat cultures x 2 negative < 24 hours - Will switch midline for PICC in RUE, hopefully  this afternoon once cultures negative x 48 hours for prolonged ABX - Tylenol PRN for fever and mild-moderate pain - NORCO/Vicodin 5-325mg  1-2 tablets q6 hours PRN for breakthrough pain - Wet to dry dressing with Santyl applied PRN - Plan for discharge home after PICC placement   # Hypertension Blood pressures stable on home amlodipine 10mg  daily started yesterday.  - Continue to monitor for now - Outpatient follow up   # T2DM CBG's stable with minimal need for insulin.  - SSI   # Atopic Dermatitis  - Continue Kenalog cream as needed  # Genital Candidiasis  - Clotrimazole cream   Prior to Admission Living Arrangement: Home  Anticipated Discharge Location: Home Barriers to Discharge: PICC line placement  Dr. Jeralyn Bennett Internal Medicine PGY-1 Pager: [phone] After 5pm on weekdays and 1pm on weekends: On Call pager [phone]  11/29/2020, 11:42 AM

## 2020-11-29 NOTE — Plan of Care (Signed)
  Problem: Nutrition: Goal: Adequate nutrition will be maintained Outcome: Progressing   Problem: Elimination: Goal: Will not experience complications related to bowel motility Outcome: Progressing   

## 2020-11-30 ENCOUNTER — Other Ambulatory Visit: Payer: Self-pay | Admitting: Internal Medicine

## 2020-11-30 DIAGNOSIS — E1151 Type 2 diabetes mellitus with diabetic peripheral angiopathy without gangrene: Secondary | ICD-10-CM | POA: Diagnosis not present

## 2020-11-30 DIAGNOSIS — M8610 Other acute osteomyelitis, unspecified site: Secondary | ICD-10-CM

## 2020-11-30 DIAGNOSIS — L89323 Pressure ulcer of left buttock, stage 3: Secondary | ICD-10-CM | POA: Diagnosis not present

## 2020-11-30 DIAGNOSIS — L89313 Pressure ulcer of right buttock, stage 3: Secondary | ICD-10-CM | POA: Diagnosis not present

## 2020-11-30 DIAGNOSIS — S24102S Unspecified injury at T2-T6 level of thoracic spinal cord, sequela: Secondary | ICD-10-CM | POA: Diagnosis not present

## 2020-11-30 DIAGNOSIS — G8220 Paraplegia, unspecified: Secondary | ICD-10-CM | POA: Diagnosis not present

## 2020-11-30 DIAGNOSIS — L89154 Pressure ulcer of sacral region, stage 4: Secondary | ICD-10-CM | POA: Diagnosis not present

## 2020-11-30 LAB — CULTURE, BLOOD (ROUTINE X 2)
Culture: NO GROWTH
Culture: NO GROWTH
Special Requests: ADEQUATE
Special Requests: ADEQUATE

## 2020-11-30 LAB — GLUCOSE, CAPILLARY
Glucose-Capillary: 86 mg/dL (ref 70–99)
Glucose-Capillary: 116 mg/dL — ABNORMAL HIGH (ref 70–99)
Glucose-Capillary: 104 mg/dL — ABNORMAL HIGH (ref 70–99)

## 2020-12-01 DIAGNOSIS — M62562 Muscle wasting and atrophy, not elsewhere classified, left lower leg: Secondary | ICD-10-CM | POA: Diagnosis not present

## 2020-12-01 DIAGNOSIS — N319 Neuromuscular dysfunction of bladder, unspecified: Secondary | ICD-10-CM | POA: Diagnosis not present

## 2020-12-01 DIAGNOSIS — E46 Unspecified protein-calorie malnutrition: Secondary | ICD-10-CM | POA: Diagnosis not present

## 2020-12-01 DIAGNOSIS — G8929 Other chronic pain: Secondary | ICD-10-CM | POA: Diagnosis not present

## 2020-12-01 DIAGNOSIS — L89154 Pressure ulcer of sacral region, stage 4: Secondary | ICD-10-CM | POA: Diagnosis not present

## 2020-12-01 DIAGNOSIS — L89312 Pressure ulcer of right buttock, stage 2: Secondary | ICD-10-CM | POA: Diagnosis not present

## 2020-12-01 DIAGNOSIS — L209 Atopic dermatitis, unspecified: Secondary | ICD-10-CM | POA: Diagnosis not present

## 2020-12-01 DIAGNOSIS — M8580 Other specified disorders of bone density and structure, unspecified site: Secondary | ICD-10-CM | POA: Diagnosis not present

## 2020-12-01 DIAGNOSIS — G8220 Paraplegia, unspecified: Secondary | ICD-10-CM | POA: Diagnosis not present

## 2020-12-01 DIAGNOSIS — Z7401 Bed confinement status: Secondary | ICD-10-CM | POA: Diagnosis not present

## 2020-12-01 DIAGNOSIS — Z435 Encounter for attention to cystostomy: Secondary | ICD-10-CM | POA: Diagnosis not present

## 2020-12-01 DIAGNOSIS — L89323 Pressure ulcer of left buttock, stage 3: Secondary | ICD-10-CM | POA: Diagnosis not present

## 2020-12-01 DIAGNOSIS — Z89611 Acquired absence of right leg above knee: Secondary | ICD-10-CM | POA: Diagnosis not present

## 2020-12-01 DIAGNOSIS — Z1612 Extended spectrum beta lactamase (ESBL) resistance: Secondary | ICD-10-CM | POA: Diagnosis not present

## 2020-12-01 DIAGNOSIS — B3749 Other urogenital candidiasis: Secondary | ICD-10-CM | POA: Diagnosis not present

## 2020-12-01 DIAGNOSIS — B9689 Other specified bacterial agents as the cause of diseases classified elsewhere: Secondary | ICD-10-CM | POA: Diagnosis not present

## 2020-12-01 DIAGNOSIS — Z7984 Long term (current) use of oral hypoglycemic drugs: Secondary | ICD-10-CM | POA: Diagnosis not present

## 2020-12-01 DIAGNOSIS — E1151 Type 2 diabetes mellitus with diabetic peripheral angiopathy without gangrene: Secondary | ICD-10-CM | POA: Diagnosis not present

## 2020-12-01 DIAGNOSIS — I10 Essential (primary) hypertension: Secondary | ICD-10-CM | POA: Diagnosis not present

## 2020-12-01 DIAGNOSIS — L304 Erythema intertrigo: Secondary | ICD-10-CM | POA: Diagnosis not present

## 2020-12-01 DIAGNOSIS — D649 Anemia, unspecified: Secondary | ICD-10-CM | POA: Diagnosis not present

## 2020-12-01 DIAGNOSIS — A4151 Sepsis due to Escherichia coli [E. coli]: Secondary | ICD-10-CM | POA: Diagnosis not present

## 2020-12-01 DIAGNOSIS — E1169 Type 2 diabetes mellitus with other specified complication: Secondary | ICD-10-CM | POA: Diagnosis not present

## 2020-12-01 DIAGNOSIS — S24102S Unspecified injury at T2-T6 level of thoracic spinal cord, sequela: Secondary | ICD-10-CM | POA: Diagnosis not present

## 2020-12-01 DIAGNOSIS — M160 Bilateral primary osteoarthritis of hip: Secondary | ICD-10-CM | POA: Diagnosis not present

## 2020-12-01 DIAGNOSIS — N390 Urinary tract infection, site not specified: Secondary | ICD-10-CM | POA: Diagnosis not present

## 2020-12-01 DIAGNOSIS — F17210 Nicotine dependence, cigarettes, uncomplicated: Secondary | ICD-10-CM | POA: Diagnosis not present

## 2020-12-01 DIAGNOSIS — E785 Hyperlipidemia, unspecified: Secondary | ICD-10-CM | POA: Diagnosis not present

## 2020-12-01 DIAGNOSIS — L89313 Pressure ulcer of right buttock, stage 3: Secondary | ICD-10-CM | POA: Diagnosis not present

## 2020-12-01 DIAGNOSIS — B9562 Methicillin resistant Staphylococcus aureus infection as the cause of diseases classified elsewhere: Secondary | ICD-10-CM | POA: Diagnosis not present

## 2020-12-01 DIAGNOSIS — Z466 Encounter for fitting and adjustment of urinary device: Secondary | ICD-10-CM | POA: Diagnosis not present

## 2020-12-01 DIAGNOSIS — M62561 Muscle wasting and atrophy, not elsewhere classified, right lower leg: Secondary | ICD-10-CM | POA: Diagnosis not present

## 2020-12-01 DIAGNOSIS — B964 Proteus (mirabilis) (morganii) as the cause of diseases classified elsewhere: Secondary | ICD-10-CM | POA: Diagnosis not present

## 2020-12-01 DIAGNOSIS — R627 Adult failure to thrive: Secondary | ICD-10-CM | POA: Diagnosis not present

## 2020-12-01 DIAGNOSIS — K219 Gastro-esophageal reflux disease without esophagitis: Secondary | ICD-10-CM | POA: Diagnosis not present

## 2020-12-01 DIAGNOSIS — M4628 Osteomyelitis of vertebra, sacral and sacrococcygeal region: Secondary | ICD-10-CM | POA: Diagnosis not present

## 2020-12-01 DIAGNOSIS — A4159 Other Gram-negative sepsis: Secondary | ICD-10-CM | POA: Diagnosis not present

## 2020-12-01 DIAGNOSIS — Z79891 Long term (current) use of opiate analgesic: Secondary | ICD-10-CM | POA: Diagnosis not present

## 2020-12-02 LAB — CULTURE, BLOOD (ROUTINE X 2)
Culture: NO GROWTH
Culture: NO GROWTH
Special Requests: ADEQUATE
Special Requests: ADEQUATE

## 2020-12-04 ENCOUNTER — Telehealth: Payer: Self-pay | Admitting: Student

## 2020-12-04 NOTE — Telephone Encounter (Signed)
TOC HFU appointment 12/12/2020 at 2:45 pm with Dr. Marva Panda.  Appt confirmed with patient's spouse, Hassan Rowan.

## 2020-12-04 NOTE — Telephone Encounter (Signed)
-----   Message from Glenford Bayley, MD sent at 11/29/2020  2:58 PM EST ----- Regarding: HFU Appointment Needed Hello,  Could you please call to schedule Shawn [name] a hospital follow up appointment for bacteremia/UTI/osteomyelitis within the next week?  Thank you,  Dr. Laddie Aquas

## 2020-12-05 ENCOUNTER — Other Ambulatory Visit (HOSPITAL_COMMUNITY)
Admission: RE | Admit: 2020-12-05 | Discharge: 2020-12-05 | Disposition: A | Discharge status: Home / Self Care | Payer: Medicare Other | Source: Ambulatory Visit | Attending: Internal Medicine | Admitting: Internal Medicine

## 2020-12-05 DIAGNOSIS — Z5181 Encounter for therapeutic drug level monitoring: Secondary | ICD-10-CM | POA: Diagnosis not present

## 2020-12-05 DIAGNOSIS — M869 Osteomyelitis, unspecified: Secondary | ICD-10-CM | POA: Insufficient documentation

## 2020-12-05 LAB — CBC WITH DIFFERENTIAL/PLATELET
Abs Immature Granulocytes: 0.02 10*3/uL (ref 0.00–0.07)
Basophils Absolute: 0.1 10*3/uL (ref 0.0–0.1)
Basophils Relative: 1 %
Eosinophils Absolute: 0.5 10*3/uL (ref 0.0–0.5)
Eosinophils Relative: 11 %
HCT: 30.1 % — ABNORMAL LOW (ref 39.0–52.0)
Hemoglobin: 9.3 g/dL — ABNORMAL LOW (ref 13.0–17.0)
Immature Granulocytes: 0 %
Lymphocytes Relative: 34 %
Lymphs Abs: 1.6 10*3/uL (ref 0.7–4.0)
MCH: 21.1 pg — ABNORMAL LOW (ref 26.0–34.0)
MCHC: 30.9 g/dL (ref 30.0–36.0)
MCV: 68.4 fL — ABNORMAL LOW (ref 80.0–100.0)
Monocytes Absolute: 0.4 10*3/uL (ref 0.1–1.0)
Monocytes Relative: 9 %
Neutro Abs: 2.1 10*3/uL (ref 1.7–7.7)
Neutrophils Relative %: 45 %
Platelets: 538 10*3/uL — ABNORMAL HIGH (ref 150–400)
RBC: 4.4 MIL/uL (ref 4.22–5.81)
RDW: 22.1 % — ABNORMAL HIGH (ref 11.5–15.5)
WBC: 4.7 10*3/uL (ref 4.0–10.5)
nRBC: 0 % (ref 0.0–0.2)

## 2020-12-05 LAB — BASIC METABOLIC PANEL
Anion gap: 9 (ref 5–15)
BUN: 12 mg/dL (ref 8–23)
CO2: 24 mmol/L (ref 22–32)
Calcium: 8.8 mg/dL — ABNORMAL LOW (ref 8.9–10.3)
Chloride: 103 mmol/L (ref 98–111)
Creatinine, Ser: 0.53 mg/dL — ABNORMAL LOW (ref 0.61–1.24)
GFR, Estimated: >60 mL/min (ref >60)
Glucose, Bld: 82 mg/dL (ref 70–99)
Potassium: 3.8 mmol/L (ref 3.5–5.1)
Sodium: 136 mmol/L (ref 135–145)

## 2020-12-05 LAB — VANCOMYCIN, TROUGH: Vancomycin Tr: 12 ug/mL — ABNORMAL LOW (ref 15–20)

## 2020-12-07 ENCOUNTER — Other Ambulatory Visit (HOSPITAL_COMMUNITY)
Admission: RE | Admit: 2020-12-07 | Discharge: 2020-12-07 | Disposition: A | Discharge status: Home / Self Care | Payer: Medicare Other | Source: Ambulatory Visit | Attending: Internal Medicine | Admitting: Internal Medicine

## 2020-12-07 DIAGNOSIS — Z5181 Encounter for therapeutic drug level monitoring: Secondary | ICD-10-CM | POA: Diagnosis not present

## 2020-12-07 DIAGNOSIS — M869 Osteomyelitis, unspecified: Secondary | ICD-10-CM | POA: Diagnosis not present

## 2020-12-07 LAB — BASIC METABOLIC PANEL
Anion gap: 8 (ref 5–15)
BUN: 20 mg/dL (ref 8–23)
CO2: 25 mmol/L (ref 22–32)
Calcium: 8.2 mg/dL — ABNORMAL LOW (ref 8.9–10.3)
Chloride: 105 mmol/L (ref 98–111)
Creatinine, Ser: 0.63 mg/dL (ref 0.61–1.24)
GFR, Estimated: >60 mL/min (ref >60)
Glucose, Bld: 92 mg/dL (ref 70–99)
Potassium: 3.8 mmol/L (ref 3.5–5.1)
Sodium: 138 mmol/L (ref 135–145)

## 2020-12-07 LAB — SEDIMENTATION RATE: Sed Rate: 43 mm/hr — ABNORMAL HIGH (ref 0–16)

## 2020-12-07 LAB — C-REACTIVE PROTEIN: CRP: 3 mg/dL — ABNORMAL HIGH (ref <1.0)

## 2020-12-07 LAB — VANCOMYCIN, TROUGH: Vancomycin Tr: 9 ug/mL — ABNORMAL LOW (ref 15–20)

## 2020-12-11 ENCOUNTER — Encounter (HOSPITAL_COMMUNITY)
Admission: RE | Admit: 2020-12-11 | Discharge: 2020-12-11 | Disposition: A | Discharge status: Home / Self Care | Payer: Medicare Other | Source: Skilled Nursing Facility | Attending: Internal Medicine | Admitting: Internal Medicine

## 2020-12-11 DIAGNOSIS — M869 Osteomyelitis, unspecified: Secondary | ICD-10-CM | POA: Diagnosis not present

## 2020-12-11 LAB — CBC WITH DIFFERENTIAL/PLATELET
Abs Immature Granulocytes: 0.01 10*3/uL (ref 0.00–0.07)
Basophils Absolute: 0.1 10*3/uL (ref 0.0–0.1)
Basophils Relative: 1 %
Eosinophils Absolute: 0.2 10*3/uL (ref 0.0–0.5)
Eosinophils Relative: 3 %
HCT: 32.2 % — ABNORMAL LOW (ref 39.0–52.0)
Hemoglobin: 9.6 g/dL — ABNORMAL LOW (ref 13.0–17.0)
Immature Granulocytes: 0 %
Lymphocytes Relative: 21 %
Lymphs Abs: 1.3 10*3/uL (ref 0.7–4.0)
MCH: 20.7 pg — ABNORMAL LOW (ref 26.0–34.0)
MCHC: 29.8 g/dL — ABNORMAL LOW (ref 30.0–36.0)
MCV: 69.4 fL — ABNORMAL LOW (ref 80.0–100.0)
Monocytes Absolute: 0.7 10*3/uL (ref 0.1–1.0)
Monocytes Relative: 12 %
Neutro Abs: 3.9 10*3/uL (ref 1.7–7.7)
Neutrophils Relative %: 63 %
Platelets: 507 10*3/uL — ABNORMAL HIGH (ref 150–400)
RBC: 4.64 MIL/uL (ref 4.22–5.81)
RDW: 22.7 % — ABNORMAL HIGH (ref 11.5–15.5)
WBC: 6.2 10*3/uL (ref 4.0–10.5)
nRBC: 0 % (ref 0.0–0.2)

## 2020-12-11 LAB — BASIC METABOLIC PANEL
Anion gap: 10 (ref 5–15)
BUN: 13 mg/dL (ref 8–23)
CO2: 24 mmol/L (ref 22–32)
Calcium: 9 mg/dL (ref 8.9–10.3)
Chloride: 105 mmol/L (ref 98–111)
Creatinine, Ser: 0.56 mg/dL — ABNORMAL LOW (ref 0.61–1.24)
GFR, Estimated: >60 mL/min (ref >60)
Glucose, Bld: 89 mg/dL (ref 70–99)
Potassium: 4.2 mmol/L (ref 3.5–5.1)
Sodium: 139 mmol/L (ref 135–145)

## 2020-12-11 LAB — VANCOMYCIN, TROUGH: Vancomycin Tr: 20 ug/mL (ref 15–20)

## 2020-12-12 ENCOUNTER — Encounter: Payer: PRIVATE HEALTH INSURANCE | Admitting: Internal Medicine

## 2020-12-14 ENCOUNTER — Other Ambulatory Visit (HOSPITAL_COMMUNITY)
Admission: RE | Admit: 2020-12-14 | Discharge: 2020-12-14 | Disposition: A | Discharge status: Home / Self Care | Payer: Medicare Other | Source: Ambulatory Visit | Attending: Internal Medicine | Admitting: Internal Medicine

## 2020-12-14 DIAGNOSIS — Z5181 Encounter for therapeutic drug level monitoring: Secondary | ICD-10-CM | POA: Insufficient documentation

## 2020-12-14 DIAGNOSIS — M8610 Other acute osteomyelitis, unspecified site: Secondary | ICD-10-CM | POA: Diagnosis not present

## 2020-12-14 LAB — BASIC METABOLIC PANEL
Anion gap: 9 (ref 5–15)
BUN: 13 mg/dL (ref 8–23)
CO2: 25 mmol/L (ref 22–32)
Calcium: 8.8 mg/dL — ABNORMAL LOW (ref 8.9–10.3)
Chloride: 107 mmol/L (ref 98–111)
Creatinine, Ser: 0.68 mg/dL (ref 0.61–1.24)
GFR, Estimated: >60 mL/min (ref >60)
Glucose, Bld: 97 mg/dL (ref 70–99)
Potassium: 4.4 mmol/L (ref 3.5–5.1)
Sodium: 141 mmol/L (ref 135–145)

## 2020-12-14 LAB — SEDIMENTATION RATE: Sed Rate: 48 mm/hr — ABNORMAL HIGH (ref 0–16)

## 2020-12-14 LAB — VANCOMYCIN, TROUGH: Vancomycin Tr: 22 ug/mL (ref 15–20)

## 2020-12-14 LAB — C-REACTIVE PROTEIN: CRP: 5.8 mg/dL — ABNORMAL HIGH (ref <1.0)

## 2020-12-15 ENCOUNTER — Encounter: Payer: Self-pay | Admitting: Infectious Diseases

## 2020-12-15 ENCOUNTER — Other Ambulatory Visit: Payer: Self-pay

## 2020-12-15 ENCOUNTER — Ambulatory Visit (INDEPENDENT_AMBULATORY_CARE_PROVIDER_SITE_OTHER): Payer: Medicare Other | Admitting: Infectious Diseases

## 2020-12-15 VITALS — BP 147/80 | HR 70 | Temp 97.7°F

## 2020-12-15 DIAGNOSIS — M8610 Other acute osteomyelitis, unspecified site: Secondary | ICD-10-CM

## 2020-12-15 DIAGNOSIS — Z5181 Encounter for therapeutic drug level monitoring: Secondary | ICD-10-CM

## 2020-12-15 DIAGNOSIS — Z7185 Encounter for immunization safety counseling: Secondary | ICD-10-CM
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
## 2020-12-16 DIAGNOSIS — Z7185 Encounter for immunization safety counseling: Secondary | ICD-10-CM | POA: Insufficient documentation

## 2020-12-16 DIAGNOSIS — Z5181 Encounter for therapeutic drug level monitoring: Secondary | ICD-10-CM | POA: Insufficient documentation

## 2020-12-16 LAB — C-REACTIVE PROTEIN: CRP: 35.4 mg/L — ABNORMAL HIGH (ref <8.0)

## 2020-12-16 LAB — SEDIMENTATION RATE: Sed Rate: 34 mm/h — ABNORMAL HIGH (ref 0–20)

## 2020-12-16 NOTE — Assessment & Plan Note (Signed)
Continue Vancomycin and Ertapenem. Plan to continue until 01/09/20 ESR and CRP today Fu in 3 weeks around the end of therapy

## 2020-12-16 NOTE — Assessment & Plan Note (Signed)
Counseled on COVID booster vaccine

## 2020-12-16 NOTE — Assessment & Plan Note (Addendum)
Labs reviewed from care everywehere Cr WNL

## 2020-12-18 ENCOUNTER — Other Ambulatory Visit: Payer: Self-pay

## 2020-12-18 ENCOUNTER — Encounter: Payer: Self-pay | Admitting: Internal Medicine

## 2020-12-18 ENCOUNTER — Encounter (HOSPITAL_COMMUNITY)
Admission: RE | Admit: 2020-12-18 | Discharge: 2020-12-18 | Disposition: A | Discharge status: Home / Self Care | Payer: Medicare Other | Source: Skilled Nursing Facility | Attending: Internal Medicine | Admitting: Internal Medicine

## 2020-12-18 ENCOUNTER — Ambulatory Visit (INDEPENDENT_AMBULATORY_CARE_PROVIDER_SITE_OTHER): Payer: Medicare Other | Admitting: Internal Medicine

## 2020-12-18 VITALS — BP 118/76 | HR 70 | Temp 98.1°F

## 2020-12-18 DIAGNOSIS — M86659 Other chronic osteomyelitis, unspecified thigh: Secondary | ICD-10-CM

## 2020-12-18 DIAGNOSIS — M869 Osteomyelitis, unspecified: Secondary | ICD-10-CM | POA: Diagnosis not present

## 2020-12-18 DIAGNOSIS — E11622 Type 2 diabetes mellitus with other skin ulcer: Secondary | ICD-10-CM

## 2020-12-18 LAB — CBC WITH DIFFERENTIAL/PLATELET
Abs Immature Granulocytes: 0.02 10*3/uL (ref 0.00–0.07)
Basophils Absolute: 0 10*3/uL (ref 0.0–0.1)
Basophils Relative: 1 %
Eosinophils Absolute: 0.5 10*3/uL (ref 0.0–0.5)
Eosinophils Relative: 10 %
HCT: 31.4 % — ABNORMAL LOW (ref 39.0–52.0)
Hemoglobin: 9.3 g/dL — ABNORMAL LOW (ref 13.0–17.0)
Immature Granulocytes: 0 %
Lymphocytes Relative: 25 %
Lymphs Abs: 1.3 10*3/uL (ref 0.7–4.0)
MCH: 20.8 pg — ABNORMAL LOW (ref 26.0–34.0)
MCHC: 29.6 g/dL — ABNORMAL LOW (ref 30.0–36.0)
MCV: 70.2 fL — ABNORMAL LOW (ref 80.0–100.0)
Monocytes Absolute: 0.4 10*3/uL (ref 0.1–1.0)
Monocytes Relative: 8 %
Neutro Abs: 3 10*3/uL (ref 1.7–7.7)
Neutrophils Relative %: 56 %
Platelets: 531 10*3/uL — ABNORMAL HIGH (ref 150–400)
RBC: 4.47 MIL/uL (ref 4.22–5.81)
RDW: 21.8 % — ABNORMAL HIGH (ref 11.5–15.5)
WBC: 5.3 10*3/uL (ref 4.0–10.5)
nRBC: 0 % (ref 0.0–0.2)

## 2020-12-18 LAB — BASIC METABOLIC PANEL
Anion gap: 8 (ref 5–15)
BUN: 15 mg/dL (ref 8–23)
CO2: 25 mmol/L (ref 22–32)
Calcium: 8.4 mg/dL — ABNORMAL LOW (ref 8.9–10.3)
Chloride: 106 mmol/L (ref 98–111)
Creatinine, Ser: 0.6 mg/dL — ABNORMAL LOW (ref 0.61–1.24)
GFR, Estimated: >60 mL/min (ref >60)
Glucose, Bld: 76 mg/dL (ref 70–99)
Potassium: 4.2 mmol/L (ref 3.5–5.1)
Sodium: 139 mmol/L (ref 135–145)

## 2020-12-18 LAB — POCT GLYCOSYLATED HEMOGLOBIN (HGB A1C): Hemoglobin A1C: 5.5 % (ref 4.0–5.6)

## 2020-12-18 LAB — GLUCOSE, CAPILLARY: Glucose-Capillary: 87 mg/dL (ref 70–99)

## 2020-12-18 LAB — VANCOMYCIN, TROUGH: Vancomycin Tr: 19 ug/mL (ref 15–20)

## 2020-12-18 NOTE — Patient Instructions (Addendum)
Thank you, [name] for allowing [place] to provide your care today. Today we discussed wound.    I have ordered the following labs for you:   Lab Orders     POC Hbg A1C   Tests ordered today:  none  Referrals ordered today:    Referral Orders     Ambulatory referral to Ophthalmology   I have ordered the following medication/changed the following medications:   Stop the following medications: Medications Discontinued During This Encounter  Medication Reason  . insulin glargine (LANTUS) 100 UNIT/ML injection      Start the following medications: No orders of the defined types were placed in this encounter.    Follow up: 2-3 months with your PCP   Should you have any questions or concerns please call the internal medicine clinic at [phone].     Marianna Payment, D.O. [place]

## 2020-12-18 NOTE — Progress Notes (Signed)
CC: Sacral wound  HPI:  Shawn [name]. is a 70 y.o. male with a past medical history stated below and presents today for sacral wound and hospital follow up. Please see problem based assessment and plan for additional details.  Past Medical History:  Diagnosis Date  . Allergy   . Arthritis   . C. difficile colitis 11/2008  . Decubitus ulcer 1999   excision of right ischial pressure sore with flap reconstruction done by Dr. Denese Killings 10/31/2008  . Diabetic ketosis ([place]) 05/05/2019  . GERD (gastroesophageal reflux disease)   . Hyperlipidemia   . Hypertension   . Paraplegia ([place])    secondary to [place]  . Perineal abscess   . Substance abuse ([place])    alcohol    Current Outpatient Medications on File Prior to Visit  Medication Sig Dispense Refill  . Accu-Chek Softclix Lancets lancets Use as instructed to check blood sugar one time a day 100 each 12  . acetaminophen (TYLENOL) 500 MG tablet Take 1,000 mg by mouth every 6 (six) hours as needed for mild pain.    Marland Kitchen amLODipine (NORVASC) 10 MG tablet Take 1 tablet (10 mg total) by mouth daily. 90 tablet 2  . clotrimazole (LOTRIMIN) 1 % cream Apply topically 2 (two) times daily. 113 g 0  . diclofenac sodium (VOLTAREN) 1 % GEL APPLY TOPICALLY 4 TIMES DAILY. (Patient taking differently: Apply 2 g topically 4 (four) times daily as needed (pain).) 100 g 1  . ertapenem (INVANZ) IVPB Inject 1 g into the vein daily. Indication:  Sacral osteomyelitis  First Dose: Yes Last Day of Therapy:  01/08/21 Labs - Once weekly:  CBC/D and BMP, Labs - Every other week:  ESR and CRP Method of administration: Mini-Bag Plus / Gravity Method of administration may be changed at the discretion of home infusion pharmacist based upon assessment of the patient and/or caregiver's ability to self-administer the medication ordered. 41 Units 0  . ezetimibe (ZETIA) 10 MG tablet Take 1 tablet (10 mg total) by mouth daily. 90 tablet 2  . ferrous sulfate 325 (65 FE) MG  EC tablet Take 1 tablet (325 mg total) by mouth every other day. 45 tablet 3  . gabapentin (NEURONTIN) 400 MG capsule Take 1 capsule (400 mg total) by mouth 2 (two) times daily. 60 capsule 0  . glucose blood (ACCU-CHEK GUIDE) test strip Check blood sugar 1 time per day 100 each 12  . metFORMIN (GLUCOPHAGE XR) 500 MG 24 hr tablet Take 2 tablets (1,000 mg total) by mouth in the morning and at bedtime. 120 tablet 5  . methocarbamol (ROBAXIN) 500 MG tablet TAKE 1 TABLET BY MOUTH 2 TIMES DAILY AS NEEDED FOR MUSCLE SPASMS. (Patient taking differently: Take by mouth 2 (two) times daily as needed for muscle spasms. TAKE 1 TABLET BY MOUTH 2 TIMES DAILY AS NEEDED FOR MUSCLE SPASMS.) 60 tablet 5  . mirtazapine (REMERON) 7.5 MG tablet TAKE 1 TABLET BY MOUTH AT BEDTIME. (Patient taking differently: Take 7.5 mg by mouth at bedtime as needed (sleep).) 90 tablet 1  . Nutritional Supplements (ENSURE COMPACT) LIQD Take 237 mLs by mouth in the morning, at noon, and at bedtime. Ensure Premier---only 1gm of carbs per 8 0z    . omeprazole (PRILOSEC) 40 MG capsule Take 1 capsule (40 mg total) by mouth daily. 90 capsule 2  . thiamine 100 MG tablet Take 1 tablet (100 mg total) by mouth daily. 90 tablet 2  . triamcinolone (KENALOG) 0.1 %  Apply topically 3 (three) times daily. To rash on arms 45 g 0  . vancomycin IVPB Inject 1,000 mg into the vein every 12 (twelve) hours. Indication:  Sacral osteomyelitis  First Dose: Yes Last Day of Therapy:  01/08/21 Labs - Sunday/Monday:  CBC/D, BMP, and vancomycin trough. Labs - Thursday:  BMP and vancomycin trough Labs - Every other week:  ESR and CRP Method of administration:Elastomeric Method of administration may be changed at the discretion of the patient and/or caregiver's ability to self-administer the medication ordered. 82 Units 0  . zinc sulfate 220 (50 Zn) MG capsule Take 1 capsule (220 mg total) by mouth daily. 90 capsule 1   No current facility-administered medications on  file prior to visit.    Family History  Problem Relation Age of Onset  . Stroke Mother   . Coronary artery disease Father   . Coronary artery disease Paternal Uncle   . Coronary artery disease Paternal Aunt     Social History   Socioeconomic History  . Marital status: Married    Spouse name: Not on file  . Number of children: 1  . Years of education: Not on file  . Highest education level: Not on file  Occupational History  . Occupation: DISABILITY    Employer: UNEMPLOYED  Tobacco Use  . Smoking status: Former Smoker    Packs/day: 0.50    Years: 40.00    Pack years: 20.00    Types: Cigarettes    Quit date: 07/09/2020    Years since quitting: 0.4  . Smokeless tobacco: Never Used  . Tobacco comment: 1 pack every 2 -3 days  Vaping Use  . Vaping Use: Never used  Substance and Sexual Activity  . Alcohol use: No    Alcohol/week: 0.0 standard drinks  . Drug use: No  . Sexual activity: Not on file  Other Topics Concern  . Not on file  Social History Narrative   Married, disabled, medicaid, functions independently at home, paraplegic      Family hx of : hypertension, diabetes, kidney disease, and other cancer   Social Determinants of Health   Financial Resource Strain: Not on file  Food Insecurity: Not on file  Transportation Needs: Not on file  Physical Activity: Not on file  Stress: Not on file  Social Connections: Not on file  Intimate Partner Violence: Not on file    Review of Systems: ROS negative except for what is noted on the assessment and plan.  Vitals:   12/18/20 1344  BP: 118/76  Pulse: 70  Temp: 98.1 F (36.7 C)  TempSrc: Oral  SpO2: 93%     Physical Exam: Physical Exam Constitutional:      Appearance: Normal appearance.  HENT:     Head: Normocephalic and atraumatic.  Cardiovascular:     Rate and Rhythm: Normal rate and regular rhythm.     Pulses: Normal pulses.     Heart sounds: Normal heart sounds.  Pulmonary:     Effort: Pulmonary  effort is normal.     Breath sounds: Normal breath sounds.  Musculoskeletal:        General: Deformity (right AKA) present.     Cervical back: Normal range of motion.  Skin:    General: Skin is warm and dry.     Findings: Lesion (sacral wound with residual sinus tracts, fibrinous drainage clean wound base and margines. ) present.  Neurological:     General: No focal deficit present.     Mental Status:  He is alert and oriented to person, place, and time.      Assessment & Plan:   See Encounters Tab for problem based charting.  Patient discussed with Dr. Bertell Maria, D.O. [facility], PGY-2 Pager: [phone], Phone: [phone] Date 12/19/2020 Time 5:12 PM

## 2020-12-19 ENCOUNTER — Encounter: Payer: Self-pay | Admitting: Internal Medicine

## 2020-12-19 MED ORDER — GLUCOSE BLOOD VI STRP: ORAL_STRIP | 12 refills | Status: AC

## 2020-12-19 NOTE — Assessment & Plan Note (Signed)
Patient presents for hospital follow-up for his sacral osteomyelitis.  Patient states that he is doing well since discharge.  They are performing bandage changes once to twice daily depending on his bowel movements.  He recently saw his plastic surgeon at [facility] 2 weeks ago.  His follow-up appointment with them will be on January 19th. He just had a hospital follow up appointment with ID this past Friday. He is currently on vancomycin and ertapenem. He will remain on these antibiotics until the end of the month (01/08/21). Recent labs drawn yesterday are unchanged from previous.  Plan: -Continue antibiotics within the month -Continue daily to twice daily dressing changes -Continue following with plastic surgery infectious disease

## 2020-12-19 NOTE — Telephone Encounter (Signed)
Pt came to his appt yesterday; saw Dr Marianna Payment.

## 2020-12-19 NOTE — Assessment & Plan Note (Signed)
Refilled patient's test trips today.  Patient states that he continues to take metformin but has recently stopped taking his insulin due to lower blood sugars.  I will repeat an A1c today and refer him for an eye exam.  Patient will need to follow-up with his PCP for further evaluation management of his diabetes.

## 2020-12-20 ENCOUNTER — Telehealth: Payer: Self-pay

## 2020-12-20 NOTE — Telephone Encounter (Signed)
Received call from [facility] from [facility]. She is wondering about patient's physical limitations and restrictions in regards to his sacral wound. RN advised her there is no information regarding restrictions in the providers note. Provided Anne Ng with wound care team phone number.  Beryle Flock, RN

## 2020-12-21 ENCOUNTER — Other Ambulatory Visit (HOSPITAL_COMMUNITY)
Admission: RE | Admit: 2020-12-21 | Discharge: 2020-12-21 | Disposition: A | Discharge status: Home / Self Care | Payer: Medicare Other | Source: Skilled Nursing Facility | Attending: Internal Medicine | Admitting: Internal Medicine

## 2020-12-21 DIAGNOSIS — Z5181 Encounter for therapeutic drug level monitoring: Secondary | ICD-10-CM | POA: Insufficient documentation

## 2020-12-21 LAB — SEDIMENTATION RATE: Sed Rate: 28 mm/hr — ABNORMAL HIGH (ref 0–16)

## 2020-12-21 LAB — BASIC METABOLIC PANEL
Anion gap: 8 (ref 5–15)
BUN: 16 mg/dL (ref 8–23)
CO2: 27 mmol/L (ref 22–32)
Calcium: 8.4 mg/dL — ABNORMAL LOW (ref 8.9–10.3)
Chloride: 104 mmol/L (ref 98–111)
Creatinine, Ser: 0.77 mg/dL (ref 0.61–1.24)
GFR, Estimated: >60 mL/min (ref >60)
Glucose, Bld: 89 mg/dL (ref 70–99)
Potassium: 3.8 mmol/L (ref 3.5–5.1)
Sodium: 139 mmol/L (ref 135–145)

## 2020-12-21 LAB — C-REACTIVE PROTEIN: CRP: 3.5 mg/dL — ABNORMAL HIGH (ref <1.0)

## 2020-12-21 LAB — VANCOMYCIN, TROUGH: Vancomycin Tr: 16 ug/mL (ref 15–20)

## 2020-12-26 ENCOUNTER — Other Ambulatory Visit (HOSPITAL_COMMUNITY)
Admission: RE | Admit: 2020-12-26 | Discharge: 2020-12-26 | Disposition: A | Discharge status: Home / Self Care | Payer: Medicare Other | Source: Skilled Nursing Facility | Attending: Internal Medicine | Admitting: Internal Medicine

## 2020-12-26 DIAGNOSIS — M869 Osteomyelitis, unspecified: Secondary | ICD-10-CM | POA: Diagnosis not present

## 2020-12-26 DIAGNOSIS — Z5181 Encounter for therapeutic drug level monitoring: Secondary | ICD-10-CM | POA: Diagnosis not present

## 2020-12-26 LAB — CBC WITH DIFFERENTIAL/PLATELET
Abs Immature Granulocytes: 0.01 10*3/uL (ref 0.00–0.07)
Basophils Absolute: 0.1 10*3/uL (ref 0.0–0.1)
Basophils Relative: 1 %
Eosinophils Absolute: 0.5 10*3/uL (ref 0.0–0.5)
Eosinophils Relative: 10 %
HCT: 30.5 % — ABNORMAL LOW (ref 39.0–52.0)
Hemoglobin: 9.1 g/dL — ABNORMAL LOW (ref 13.0–17.0)
Immature Granulocytes: 0 %
Lymphocytes Relative: 27 %
Lymphs Abs: 1.3 10*3/uL (ref 0.7–4.0)
MCH: 20.3 pg — ABNORMAL LOW (ref 26.0–34.0)
MCHC: 29.8 g/dL — ABNORMAL LOW (ref 30.0–36.0)
MCV: 68.1 fL — ABNORMAL LOW (ref 80.0–100.0)
Monocytes Absolute: 0.6 10*3/uL (ref 0.1–1.0)
Monocytes Relative: 13 %
Neutro Abs: 2.3 10*3/uL (ref 1.7–7.7)
Neutrophils Relative %: 49 %
Platelets: 383 10*3/uL (ref 150–400)
RBC: 4.48 MIL/uL (ref 4.22–5.81)
RDW: 21.2 % — ABNORMAL HIGH (ref 11.5–15.5)
WBC: 4.8 10*3/uL (ref 4.0–10.5)
nRBC: 0 % (ref 0.0–0.2)

## 2020-12-26 LAB — BASIC METABOLIC PANEL
Anion gap: 7 (ref 5–15)
BUN: 12 mg/dL (ref 8–23)
CO2: 26 mmol/L (ref 22–32)
Calcium: 8.6 mg/dL — ABNORMAL LOW (ref 8.9–10.3)
Chloride: 105 mmol/L (ref 98–111)
Creatinine, Ser: 0.61 mg/dL (ref 0.61–1.24)
GFR, Estimated: >60 mL/min (ref >60)
Glucose, Bld: 84 mg/dL (ref 70–99)
Potassium: 3.7 mmol/L (ref 3.5–5.1)
Sodium: 138 mmol/L (ref 135–145)

## 2020-12-26 LAB — VANCOMYCIN, TROUGH: Vancomycin Tr: 14 ug/mL — ABNORMAL LOW (ref 15–20)

## 2020-12-26 NOTE — Progress Notes (Signed)
Internal Medicine Clinic Attending  Case discussed with Dr. Coe  At the time of the visit.  We reviewed the resident's history and exam and pertinent patient test results.  I agree with the assessment, diagnosis, and plan of care documented in the resident's note.  

## 2020-12-28 ENCOUNTER — Other Ambulatory Visit (HOSPITAL_COMMUNITY)
Admission: RE | Admit: 2020-12-28 | Discharge: 2020-12-28 | Disposition: A | Discharge status: Home / Self Care | Payer: Medicare Other | Source: Skilled Nursing Facility | Attending: Internal Medicine | Admitting: Internal Medicine

## 2020-12-28 DIAGNOSIS — E1169 Type 2 diabetes mellitus with other specified complication: Secondary | ICD-10-CM | POA: Insufficient documentation

## 2020-12-28 DIAGNOSIS — A4151 Sepsis due to Escherichia coli [E. coli]: Secondary | ICD-10-CM | POA: Insufficient documentation

## 2020-12-28 LAB — BASIC METABOLIC PANEL
Anion gap: 10 (ref 5–15)
BUN: 16 mg/dL (ref 8–23)
CO2: 22 mmol/L (ref 22–32)
Calcium: 8.8 mg/dL — ABNORMAL LOW (ref 8.9–10.3)
Chloride: 105 mmol/L (ref 98–111)
Creatinine, Ser: 0.64 mg/dL (ref 0.61–1.24)
GFR, Estimated: >60 mL/min (ref >60)
Glucose, Bld: 83 mg/dL (ref 70–99)
Potassium: 4.4 mmol/L (ref 3.5–5.1)
Sodium: 137 mmol/L (ref 135–145)

## 2020-12-28 LAB — VANCOMYCIN, TROUGH: Vancomycin Tr: 19 ug/mL (ref 15–20)

## 2020-12-31 DIAGNOSIS — G8929 Other chronic pain: Secondary | ICD-10-CM | POA: Diagnosis not present

## 2020-12-31 DIAGNOSIS — M160 Bilateral primary osteoarthritis of hip: Secondary | ICD-10-CM | POA: Diagnosis not present

## 2020-12-31 DIAGNOSIS — B3749 Other urogenital candidiasis: Secondary | ICD-10-CM | POA: Diagnosis not present

## 2020-12-31 DIAGNOSIS — N390 Urinary tract infection, site not specified: Secondary | ICD-10-CM | POA: Diagnosis not present

## 2020-12-31 DIAGNOSIS — N319 Neuromuscular dysfunction of bladder, unspecified: Secondary | ICD-10-CM | POA: Diagnosis not present

## 2020-12-31 DIAGNOSIS — L209 Atopic dermatitis, unspecified: Secondary | ICD-10-CM | POA: Diagnosis not present

## 2020-12-31 DIAGNOSIS — B964 Proteus (mirabilis) (morganii) as the cause of diseases classified elsewhere: Secondary | ICD-10-CM | POA: Diagnosis not present

## 2020-12-31 DIAGNOSIS — L89312 Pressure ulcer of right buttock, stage 2: Secondary | ICD-10-CM | POA: Diagnosis not present

## 2020-12-31 DIAGNOSIS — S24102S Unspecified injury at T2-T6 level of thoracic spinal cord, sequela: Secondary | ICD-10-CM | POA: Diagnosis not present

## 2020-12-31 DIAGNOSIS — G8220 Paraplegia, unspecified: Secondary | ICD-10-CM | POA: Diagnosis not present

## 2020-12-31 DIAGNOSIS — L89154 Pressure ulcer of sacral region, stage 4: Secondary | ICD-10-CM | POA: Diagnosis not present

## 2020-12-31 DIAGNOSIS — M4628 Osteomyelitis of vertebra, sacral and sacrococcygeal region: Secondary | ICD-10-CM | POA: Diagnosis not present

## 2020-12-31 DIAGNOSIS — K219 Gastro-esophageal reflux disease without esophagitis: Secondary | ICD-10-CM | POA: Diagnosis not present

## 2020-12-31 DIAGNOSIS — A4151 Sepsis due to Escherichia coli [E. coli]: Secondary | ICD-10-CM | POA: Diagnosis not present

## 2020-12-31 DIAGNOSIS — E1169 Type 2 diabetes mellitus with other specified complication: Secondary | ICD-10-CM | POA: Diagnosis not present

## 2020-12-31 DIAGNOSIS — L89313 Pressure ulcer of right buttock, stage 3: Secondary | ICD-10-CM | POA: Diagnosis not present

## 2020-12-31 DIAGNOSIS — Z1612 Extended spectrum beta lactamase (ESBL) resistance: Secondary | ICD-10-CM | POA: Diagnosis not present

## 2020-12-31 DIAGNOSIS — E1151 Type 2 diabetes mellitus with diabetic peripheral angiopathy without gangrene: Secondary | ICD-10-CM | POA: Diagnosis not present

## 2020-12-31 DIAGNOSIS — M62562 Muscle wasting and atrophy, not elsewhere classified, left lower leg: Secondary | ICD-10-CM | POA: Diagnosis not present

## 2020-12-31 DIAGNOSIS — I10 Essential (primary) hypertension: Secondary | ICD-10-CM | POA: Diagnosis not present

## 2020-12-31 DIAGNOSIS — B9562 Methicillin resistant Staphylococcus aureus infection as the cause of diseases classified elsewhere: Secondary | ICD-10-CM | POA: Diagnosis not present

## 2020-12-31 DIAGNOSIS — A4159 Other Gram-negative sepsis: Secondary | ICD-10-CM | POA: Diagnosis not present

## 2020-12-31 DIAGNOSIS — L304 Erythema intertrigo: Secondary | ICD-10-CM | POA: Diagnosis not present

## 2020-12-31 DIAGNOSIS — B9689 Other specified bacterial agents as the cause of diseases classified elsewhere: Secondary | ICD-10-CM | POA: Diagnosis not present

## 2020-12-31 DIAGNOSIS — M8580 Other specified disorders of bone density and structure, unspecified site: Secondary | ICD-10-CM | POA: Diagnosis not present

## 2021-01-01 ENCOUNTER — Other Ambulatory Visit (HOSPITAL_COMMUNITY)
Admission: RE | Admit: 2021-01-01 | Discharge: 2021-01-01 | Disposition: A | Discharge status: Home / Self Care | Payer: Medicare Other | Source: Other Acute Inpatient Hospital | Attending: Internal Medicine | Admitting: Internal Medicine

## 2021-01-01 DIAGNOSIS — A4151 Sepsis due to Escherichia coli [E. coli]: Secondary | ICD-10-CM | POA: Diagnosis not present

## 2021-01-01 LAB — CBC WITH DIFFERENTIAL/PLATELET
Abs Immature Granulocytes: 0.01 10*3/uL (ref 0.00–0.07)
Basophils Absolute: 0.1 10*3/uL (ref 0.0–0.1)
Basophils Relative: 1 %
Eosinophils Absolute: 0.7 10*3/uL — ABNORMAL HIGH (ref 0.0–0.5)
Eosinophils Relative: 17 %
HCT: 30.8 % — ABNORMAL LOW (ref 39.0–52.0)
Hemoglobin: 9.3 g/dL — ABNORMAL LOW (ref 13.0–17.0)
Immature Granulocytes: 0 %
Lymphocytes Relative: 32 %
Lymphs Abs: 1.3 10*3/uL (ref 0.7–4.0)
MCH: 20.7 pg — ABNORMAL LOW (ref 26.0–34.0)
MCHC: 30.2 g/dL (ref 30.0–36.0)
MCV: 68.6 fL — ABNORMAL LOW (ref 80.0–100.0)
Monocytes Absolute: 0.4 10*3/uL (ref 0.1–1.0)
Monocytes Relative: 9 %
Neutro Abs: 1.7 10*3/uL (ref 1.7–7.7)
Neutrophils Relative %: 41 %
Platelets: 394 10*3/uL (ref 150–400)
RBC: 4.49 MIL/uL (ref 4.22–5.81)
RDW: 21.2 % — ABNORMAL HIGH (ref 11.5–15.5)
WBC: 4.1 10*3/uL (ref 4.0–10.5)
nRBC: 0 % (ref 0.0–0.2)

## 2021-01-01 LAB — BASIC METABOLIC PANEL
Anion gap: 7 (ref 5–15)
BUN: 17 mg/dL (ref 8–23)
CO2: 23 mmol/L (ref 22–32)
Calcium: 8.6 mg/dL — ABNORMAL LOW (ref 8.9–10.3)
Chloride: 106 mmol/L (ref 98–111)
Creatinine, Ser: 0.62 mg/dL (ref 0.61–1.24)
GFR, Estimated: >60 mL/min (ref >60)
Glucose, Bld: 84 mg/dL (ref 70–99)
Potassium: 4.1 mmol/L (ref 3.5–5.1)
Sodium: 136 mmol/L (ref 135–145)

## 2021-01-01 LAB — VANCOMYCIN, TROUGH: Vancomycin Tr: 18 ug/mL (ref 15–20)

## 2021-01-03 DIAGNOSIS — L89153 Pressure ulcer of sacral region, stage 3: Secondary | ICD-10-CM | POA: Diagnosis not present

## 2021-01-03 DIAGNOSIS — T1490XA Injury, unspecified, initial encounter: Secondary | ICD-10-CM | POA: Diagnosis not present

## 2021-01-03 DIAGNOSIS — L89154 Pressure ulcer of sacral region, stage 4: Secondary | ICD-10-CM | POA: Diagnosis not present

## 2021-01-03 DIAGNOSIS — Z7401 Bed confinement status: Secondary | ICD-10-CM | POA: Diagnosis not present

## 2021-01-03 DIAGNOSIS — L89314 Pressure ulcer of right buttock, stage 4: Secondary | ICD-10-CM | POA: Diagnosis not present

## 2021-01-03 DIAGNOSIS — I10 Essential (primary) hypertension: Secondary | ICD-10-CM | POA: Diagnosis not present

## 2021-01-04 ENCOUNTER — Other Ambulatory Visit (HOSPITAL_COMMUNITY)
Admission: RE | Admit: 2021-01-04 | Discharge: 2021-01-04 | Disposition: A | Discharge status: Home / Self Care | Payer: Medicare Other | Source: Other Acute Inpatient Hospital | Attending: Internal Medicine | Admitting: Internal Medicine

## 2021-01-04 DIAGNOSIS — Z5181 Encounter for therapeutic drug level monitoring: Secondary | ICD-10-CM | POA: Diagnosis not present

## 2021-01-04 LAB — VANCOMYCIN, TROUGH: Vancomycin Tr: 16 ug/mL (ref 15–20)

## 2021-01-04 LAB — BASIC METABOLIC PANEL
Anion gap: 5 (ref 5–15)
BUN: 16 mg/dL (ref 8–23)
CO2: 23 mmol/L (ref 22–32)
Calcium: 8.6 mg/dL — ABNORMAL LOW (ref 8.9–10.3)
Chloride: 108 mmol/L (ref 98–111)
Creatinine, Ser: 0.53 mg/dL — ABNORMAL LOW (ref 0.61–1.24)
GFR, Estimated: >60 mL/min (ref >60)
Glucose, Bld: 85 mg/dL (ref 70–99)
Potassium: 4.4 mmol/L (ref 3.5–5.1)
Sodium: 136 mmol/L (ref 135–145)

## 2021-01-04 LAB — C-REACTIVE PROTEIN: CRP: 3.4 mg/dL — ABNORMAL HIGH (ref <1.0)

## 2021-01-04 LAB — SEDIMENTATION RATE: Sed Rate: 26 mm/hr — ABNORMAL HIGH (ref 0–16)

## 2021-01-08 ENCOUNTER — Other Ambulatory Visit (HOSPITAL_COMMUNITY)
Admission: RE | Admit: 2021-01-08 | Discharge: 2021-01-08 | Disposition: A | Discharge status: Home / Self Care | Payer: Medicare Other | Source: Ambulatory Visit | Attending: Internal Medicine | Admitting: Internal Medicine

## 2021-01-08 DIAGNOSIS — A4151 Sepsis due to Escherichia coli [E. coli]: Secondary | ICD-10-CM | POA: Insufficient documentation

## 2021-01-08 LAB — CBC WITH DIFFERENTIAL/PLATELET
Abs Immature Granulocytes: 0.01 10*3/uL (ref 0.00–0.07)
Basophils Absolute: 0 10*3/uL (ref 0.0–0.1)
Basophils Relative: 1 %
Eosinophils Absolute: 0.5 10*3/uL (ref 0.0–0.5)
Eosinophils Relative: 8 %
HCT: 30.3 % — ABNORMAL LOW (ref 39.0–52.0)
Hemoglobin: 9 g/dL — ABNORMAL LOW (ref 13.0–17.0)
Immature Granulocytes: 0 %
Lymphocytes Relative: 21 %
Lymphs Abs: 1.2 10*3/uL (ref 0.7–4.0)
MCH: 20.1 pg — ABNORMAL LOW (ref 26.0–34.0)
MCHC: 29.7 g/dL — ABNORMAL LOW (ref 30.0–36.0)
MCV: 67.6 fL — ABNORMAL LOW (ref 80.0–100.0)
Monocytes Absolute: 0.6 10*3/uL (ref 0.1–1.0)
Monocytes Relative: 11 %
Neutro Abs: 3.4 10*3/uL (ref 1.7–7.7)
Neutrophils Relative %: 59 %
Platelets: 397 10*3/uL (ref 150–400)
RBC: 4.48 MIL/uL (ref 4.22–5.81)
RDW: 21 % — ABNORMAL HIGH (ref 11.5–15.5)
WBC: 5.8 10*3/uL (ref 4.0–10.5)
nRBC: 0 % (ref 0.0–0.2)

## 2021-01-08 LAB — BASIC METABOLIC PANEL
Anion gap: 9 (ref 5–15)
BUN: 15 mg/dL (ref 8–23)
CO2: 20 mmol/L — ABNORMAL LOW (ref 22–32)
Calcium: 8.6 mg/dL — ABNORMAL LOW (ref 8.9–10.3)
Chloride: 106 mmol/L (ref 98–111)
Creatinine, Ser: 0.63 mg/dL (ref 0.61–1.24)
GFR, Estimated: >60 mL/min (ref >60)
Glucose, Bld: 108 mg/dL — ABNORMAL HIGH (ref 70–99)
Potassium: 4.1 mmol/L (ref 3.5–5.1)
Sodium: 135 mmol/L (ref 135–145)

## 2021-01-08 LAB — VANCOMYCIN, TROUGH: Vancomycin Tr: 21 ug/mL (ref 15–20)

## 2021-01-10 ENCOUNTER — Other Ambulatory Visit: Payer: Self-pay

## 2021-01-10 ENCOUNTER — Encounter: Payer: Self-pay | Admitting: Infectious Diseases

## 2021-01-10 ENCOUNTER — Ambulatory Visit (INDEPENDENT_AMBULATORY_CARE_PROVIDER_SITE_OTHER): Payer: Medicare Other | Admitting: Infectious Diseases

## 2021-01-10 VITALS — BP 149/88 | HR 75 | Temp 97.5°F

## 2021-01-10 DIAGNOSIS — M8610 Other acute osteomyelitis, unspecified site: Secondary | ICD-10-CM

## 2021-01-10 NOTE — Progress Notes (Signed)
Per verbal order from Dr. [place] Bali, 49 cm PICC removed from left brachial, tip intact. No sutures present. RN confirmed length per chart. Dressing was clean and dry. Petroleum dressing applied. Patient advised no heavy lifting with this arm and to leave dressing in place for 24 hours. Advised patient to call office or seek emergency medical care if dressing becomes soaked with blood, swelling, or sharp pain presents. Advised patient to seek emergent care if develops neurological symptoms, chest pain, or shortness of breath. Patient verbalized understanding and agreement. RN answered patient's questions. Patient tolerated procedure well and RN walked patient to check out. RN notified Jeani Hawking at [facility] of removal.    Beryle Flock, RN
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
## 2021-01-11 ENCOUNTER — Encounter: Payer: Self-pay | Admitting: Infectious Diseases

## 2021-01-11 ENCOUNTER — Ambulatory Visit (INDEPENDENT_AMBULATORY_CARE_PROVIDER_SITE_OTHER): Payer: Medicare Other | Admitting: Student

## 2021-01-11 ENCOUNTER — Encounter: Payer: Self-pay | Admitting: Student

## 2021-01-11 ENCOUNTER — Other Ambulatory Visit: Payer: Self-pay | Admitting: Infectious Diseases

## 2021-01-11 DIAGNOSIS — D509 Iron deficiency anemia, unspecified: Secondary | ICD-10-CM

## 2021-01-11 DIAGNOSIS — E0800 Diabetes mellitus due to underlying condition with hyperosmolarity without nonketotic hyperglycemic-hyperosmolar coma (NKHHC): Secondary | ICD-10-CM

## 2021-01-11 DIAGNOSIS — L299 Pruritus, unspecified: Secondary | ICD-10-CM | POA: Diagnosis not present

## 2021-01-11 DIAGNOSIS — I10 Essential (primary) hypertension: Secondary | ICD-10-CM

## 2021-01-11 DIAGNOSIS — R7881 Bacteremia: Secondary | ICD-10-CM

## 2021-01-11 NOTE — Assessment & Plan Note (Signed)
Lab Results  Component Value Date   HGBA1C 5.5 12/18/2020   Assessment: A1c of 5.5. last month. Patient stopped metformin per Dr. Marianna Payment. Patient has continued to check his glucose levels and they have been within good range. If a1c continues to drop and he has good glycemic control, will consider decreasing metformin doses.   Plan: - Repeat A1c in three months - Consider decreasing metformin

## 2021-01-11 NOTE — Patient Instructions (Signed)
Thank you, [name] for allowing [place] to provide your care today. Today we discussed:  Anemia - Please start retaking your feraheme medicine  Sacral Wound - I am glad to hear it is improving, please continue to follow with [facility].  Chest Itching - Please use over the counter creams. You can also try Sarna lotion  I have ordered the following labs for you:  Lab Orders  No laboratory test(s) ordered today     Tests ordered today: None  Referrals ordered today:   Referral Orders  No referral(s) requested today     I have ordered the following medication/changed the following medications:   Stop the following medications: There are no discontinued medications.   Start the following medications: No orders of the defined types were placed in this encounter.    Follow up: 3 months    Remember: If you feel as though you need more help around the house, please give [place] a call!  Should you have any questions or concerns please call the internal medicine clinic at [phone].     Shawn [name], D.O. [place]

## 2021-01-11 NOTE — Assessment & Plan Note (Signed)
Assessment: Patient recently admitted for bactermia 2/2 ischial/sacral osteomyelitis and UTI w/ [facility] for neurogenic bladder. He completed course of vanc/ertapenem on 01/08/21. His PICC line was removed as well yesterday 01/10/21.   Plan: -Patient doing well. Completed course of ABx. PICC removed 01/10/21

## 2021-01-11 NOTE — Progress Notes (Signed)
CC: Anemia, Sacral Wound  HPI:  [name] is a 70 y.o. male with a past medical history stated below and presents today for anemia and sacral wound check. Please see problem based assessment and plan for additional details.  Past Medical History:  Diagnosis Date  . Allergy   . Arthritis   . C. difficile colitis 11/2008  . Decubitus ulcer 1999   excision of right ischial pressure sore with flap reconstruction done by Dr. Denese Killings 10/31/2008  . Diabetic ketosis ([place]) 05/05/2019  . GERD (gastroesophageal reflux disease)   . Hyperlipidemia   . Hypertension   . Paraplegia ([place])    secondary to [place]  . Perineal abscess   . Substance abuse ([place])    alcohol    Current Outpatient Medications on File Prior to Visit  Medication Sig Dispense Refill  . Accu-Chek Softclix Lancets lancets Use as instructed to check blood sugar one time a day 100 each 12  . acetaminophen (TYLENOL) 500 MG tablet Take 1,000 mg by mouth every 6 (six) hours as needed for mild pain.    Marland Kitchen amLODipine (NORVASC) 10 MG tablet Take 1 tablet (10 mg total) by mouth daily. 90 tablet 2  . clotrimazole (LOTRIMIN) 1 % cream Apply topically 2 (two) times daily. 113 g 0  . diclofenac sodium (VOLTAREN) 1 % GEL APPLY TOPICALLY 4 TIMES DAILY. (Patient taking differently: Apply 2 g topically 4 (four) times daily as needed (pain).) 100 g 1  . ezetimibe (ZETIA) 10 MG tablet Take 1 tablet (10 mg total) by mouth daily. 90 tablet 2  . ferrous sulfate 325 (65 FE) MG EC tablet Take 1 tablet (325 mg total) by mouth every other day. 45 tablet 3  . gabapentin (NEURONTIN) 300 MG capsule Take by mouth.    . gabapentin (NEURONTIN) 400 MG capsule Take 1 capsule (400 mg total) by mouth 2 (two) times daily. 60 capsule 0  . glucose blood (ACCU-CHEK GUIDE) test strip Check blood sugar 1 time per day 100 each 12  . glucose blood test strip Use as instructed 100 each 12  . metFORMIN (GLUCOPHAGE XR) 500 MG 24 hr tablet Take 2 tablets (1,000  mg total) by mouth in the morning and at bedtime. 120 tablet 5  . methocarbamol (ROBAXIN) 500 MG tablet TAKE 1 TABLET BY MOUTH 2 TIMES DAILY AS NEEDED FOR MUSCLE SPASMS. (Patient taking differently: Take by mouth 2 (two) times daily as needed for muscle spasms. TAKE 1 TABLET BY MOUTH 2 TIMES DAILY AS NEEDED FOR MUSCLE SPASMS.) 60 tablet 5  . mirtazapine (REMERON) 7.5 MG tablet TAKE 1 TABLET BY MOUTH AT BEDTIME. (Patient taking differently: Take 7.5 mg by mouth at bedtime as needed (sleep).) 90 tablet 1  . Nutritional Supplements (ENSURE COMPACT) LIQD Take 237 mLs by mouth in the morning, at noon, and at bedtime. Ensure Premier---only 1gm of carbs per 8 0z    . omeprazole (PRILOSEC) 40 MG capsule Take 1 capsule (40 mg total) by mouth daily. 90 capsule 2  . thiamine 100 MG tablet Take 1 tablet (100 mg total) by mouth daily. 90 tablet 2  . triamcinolone (KENALOG) 0.1 % Apply topically 3 (three) times daily. To rash on arms 45 g 0  . zinc sulfate 220 (50 Zn) MG capsule Take 1 capsule (220 mg total) by mouth daily. 90 capsule 1   No current facility-administered medications on file prior to visit.    Family History  Problem Relation Age of Onset  .  Stroke Mother   . Coronary artery disease Father   . Coronary artery disease Paternal Uncle   . Coronary artery disease Paternal Aunt     Social History   Socioeconomic History  . Marital status: Married    Spouse name: Not on file  . Number of children: 1  . Years of education: Not on file  . Highest education level: Not on file  Occupational History  . Occupation: DISABILITY    Employer: UNEMPLOYED  Tobacco Use  . Smoking status: Former Smoker    Packs/day: 0.50    Years: 40.00    Pack years: 20.00    Types: Cigarettes    Quit date: 07/09/2020    Years since quitting: 0.5  . Smokeless tobacco: Never Used  . Tobacco comment: 1 pack every 2 -3 days  Vaping Use  . Vaping Use: Never used  Substance and Sexual Activity  . Alcohol use: No     Alcohol/week: 0.0 standard drinks  . Drug use: No  . Sexual activity: Not on file  Other Topics Concern  . Not on file  Social History Narrative   Married, disabled, medicaid, functions independently at home, paraplegic      Family hx of : hypertension, diabetes, kidney disease, and other cancer   Social Determinants of Health   Financial Resource Strain: Not on file  Food Insecurity: Not on file  Transportation Needs: Not on file  Physical Activity: Not on file  Stress: Not on file  Social Connections: Not on file  Intimate Partner Violence: Not on file    Review of Systems: ROS negative except for what is noted on the assessment and plan.  Vitals:   01/11/21 1322  BP: (!) 153/99  Pulse: 70  Temp: 98 F (36.7 C)  TempSrc: Oral  SpO2: 96%    Physical Exam: Constitutional: Well-appearing in wheelchair HENT: normocephalic atraumatic, mucous membranes moist Eyes: conjunctiva non-erythematous Neck: supple Cardiovascular: regular rate and rhythm, no m/r/g Pulmonary/Chest: normal work of breathing on room air, lungs clear to auscultation bilaterally Abdominal:  non-distended MSK: right AKA. Well healed tissue over area. No erythema, warmth.   Neurological: alert & oriented x 3 Skin: warm and dry Psych: Normal mood  Assessment & Plan:   See Encounters Tab for problem based charting.  Patient discussed with Dr. [name] Coral, D.O. [facility], PGY-1 Pager: [phone], Phone: [phone] Date 01/11/2021 Time 6:04 PM

## 2021-01-11 NOTE — Progress Notes (Signed)
Labs 01/08/21 Cr 0.63, WBC 5.8, hb 9, platelets 397, vancomycin trough 21

## 2021-01-11 NOTE — Assessment & Plan Note (Signed)
Assessment: BP Readings from Last 3 Encounters:  01/11/21 (!) 153/99  01/10/21 (!) 149/88  12/18/20 118/76   Patient with elevated BP last two visits, not at goal. Will call tomorrow and request he keep a BP log. If continues to be elevated will restart him on HCTZ. He was on HCTZ prior but this was discontinued in setting of hyponatremia.   Plan: - continue amlodipine 10 mg - add back HCTZ if BP persistently elevated

## 2021-01-11 NOTE — Assessment & Plan Note (Signed)
Assessment: Patient endorses acute itchiness of his chest and upper extremities. Denies itching anywhere else on his body. Does not appear to have any type of rash or discoloration of the skin.   Last t. Bili a month ago was unremarkable. Do not believe he is uremic, BUN on 01/08/21 normal. Suspect patient is having some dry skin. He also notes he was applying the "steroid" cream he was prescribed to his chest which I noted could be worsening his symptoms.   Plan: - Recommended Sarna lotion and using dove moisturizing body wash.

## 2021-01-11 NOTE — Assessment & Plan Note (Signed)
Assessment: Patient with history of iron deficiency anemia, thought to be secondary to chronic sacral wound/ostemyelitis. He was discontinued off of his feraheme but is unsure when this happened and why they stopped it. I suspect it was discontinued in the setting of his recent bacteremia.   Plan: - restart feraheme every other day - repeat hgb at next visit

## 2021-01-16 NOTE — Progress Notes (Signed)
Internal Medicine Clinic Attending  Case discussed with Dr. Johnney Ou  at the time of the visit.  We reviewed the resident's history and exam and pertinent patient test results.  I agree with the assessment, diagnosis, and plan of care documented in the resident's note, with the correction of type of iron supplement discussed - patient is taking oral ferrous sulfate preparation rather than Feraheme which is an IV preparation.

## 2021-01-24 DIAGNOSIS — R0902 Hypoxemia: Secondary | ICD-10-CM | POA: Diagnosis not present

## 2021-01-24 DIAGNOSIS — R531 Weakness: Secondary | ICD-10-CM | POA: Diagnosis not present

## 2021-01-24 DIAGNOSIS — G8220 Paraplegia, unspecified: Secondary | ICD-10-CM | POA: Diagnosis not present

## 2021-01-24 DIAGNOSIS — L89314 Pressure ulcer of right buttock, stage 4: Secondary | ICD-10-CM | POA: Diagnosis not present

## 2021-01-24 DIAGNOSIS — L89153 Pressure ulcer of sacral region, stage 3: Secondary | ICD-10-CM | POA: Diagnosis not present

## 2021-01-24 DIAGNOSIS — E46 Unspecified protein-calorie malnutrition: Secondary | ICD-10-CM | POA: Diagnosis not present

## 2021-01-24 DIAGNOSIS — Z7401 Bed confinement status: Secondary | ICD-10-CM | POA: Diagnosis not present

## 2021-01-24 DIAGNOSIS — Z89611 Acquired absence of right leg above knee: Secondary | ICD-10-CM | POA: Diagnosis not present

## 2021-01-29 ENCOUNTER — Telehealth: Payer: Self-pay | Admitting: *Deleted

## 2021-01-30 DIAGNOSIS — E119 Type 2 diabetes mellitus without complications: Secondary | ICD-10-CM | POA: Diagnosis not present

## 2021-01-30 DIAGNOSIS — H5213 Myopia, bilateral: Secondary | ICD-10-CM | POA: Diagnosis not present

## 2021-01-30 DIAGNOSIS — H524 Presbyopia: Secondary | ICD-10-CM | POA: Diagnosis not present

## 2021-01-30 DIAGNOSIS — H25813 Combined forms of age-related cataract, bilateral: Secondary | ICD-10-CM | POA: Diagnosis not present

## 2021-01-30 DIAGNOSIS — H52203 Unspecified astigmatism, bilateral: Secondary | ICD-10-CM | POA: Diagnosis not present

## 2021-01-30 LAB — HM DIABETES EYE EXAM

## 2021-02-05 ENCOUNTER — Encounter: Payer: Self-pay | Admitting: *Deleted

## 2021-02-14 ENCOUNTER — Telehealth: Payer: Self-pay | Admitting: *Deleted

## 2021-02-14 NOTE — Telephone Encounter (Addendum)
Information was sent through CoverMyMeds for [place] for Methocarbamol 500 mg tablets.  Approved 02/12/2021 through 3/9/20023.  Sander Nephew, RN 02/14/2021 2:53 PM. Fax from [place] for Methocarbamol 500 mg was approved 01/10/2021 through 02/14/2022.   Sander Nephew, RN 02/15/2021 8:53 AM.

## 2021-02-20 DIAGNOSIS — N312 Flaccid neuropathic bladder, not elsewhere classified: Secondary | ICD-10-CM | POA: Diagnosis not present

## 2021-02-21 DIAGNOSIS — E46 Unspecified protein-calorie malnutrition: Secondary | ICD-10-CM | POA: Diagnosis not present

## 2021-02-21 DIAGNOSIS — I10 Essential (primary) hypertension: Secondary | ICD-10-CM | POA: Diagnosis not present

## 2021-02-21 DIAGNOSIS — Z89611 Acquired absence of right leg above knee: Secondary | ICD-10-CM | POA: Diagnosis not present

## 2021-02-21 DIAGNOSIS — L89314 Pressure ulcer of right buttock, stage 4: Secondary | ICD-10-CM | POA: Diagnosis not present

## 2021-02-21 DIAGNOSIS — R279 Unspecified lack of coordination: Secondary | ICD-10-CM | POA: Diagnosis not present

## 2021-02-21 DIAGNOSIS — Z7401 Bed confinement status: Secondary | ICD-10-CM | POA: Diagnosis not present

## 2021-02-21 DIAGNOSIS — R531 Weakness: Secondary | ICD-10-CM | POA: Diagnosis not present

## 2021-02-21 DIAGNOSIS — L89153 Pressure ulcer of sacral region, stage 3: Secondary | ICD-10-CM | POA: Diagnosis not present

## 2021-02-21 DIAGNOSIS — R0902 Hypoxemia: Secondary | ICD-10-CM | POA: Diagnosis not present

## 2021-02-21 DIAGNOSIS — L89154 Pressure ulcer of sacral region, stage 4: Secondary | ICD-10-CM | POA: Diagnosis not present

## 2021-02-21 DIAGNOSIS — G8220 Paraplegia, unspecified: Secondary | ICD-10-CM | POA: Diagnosis not present

## 2021-03-27 DIAGNOSIS — N312 Flaccid neuropathic bladder, not elsewhere classified: Secondary | ICD-10-CM | POA: Diagnosis not present

## 2021-03-28 DIAGNOSIS — G8220 Paraplegia, unspecified: Secondary | ICD-10-CM | POA: Diagnosis not present

## 2021-03-28 DIAGNOSIS — L89154 Pressure ulcer of sacral region, stage 4: Secondary | ICD-10-CM | POA: Diagnosis not present

## 2021-03-28 DIAGNOSIS — Z89611 Acquired absence of right leg above knee: Secondary | ICD-10-CM | POA: Diagnosis not present

## 2021-03-28 DIAGNOSIS — Z7401 Bed confinement status: Secondary | ICD-10-CM | POA: Diagnosis not present

## 2021-03-28 DIAGNOSIS — R0902 Hypoxemia: Secondary | ICD-10-CM | POA: Diagnosis not present

## 2021-03-28 DIAGNOSIS — L89314 Pressure ulcer of right buttock, stage 4: Secondary | ICD-10-CM | POA: Diagnosis not present

## 2021-03-28 DIAGNOSIS — I10 Essential (primary) hypertension: Secondary | ICD-10-CM | POA: Diagnosis not present

## 2021-03-28 DIAGNOSIS — E46 Unspecified protein-calorie malnutrition: Secondary | ICD-10-CM | POA: Diagnosis not present

## 2021-04-10 ENCOUNTER — Ambulatory Visit (INDEPENDENT_AMBULATORY_CARE_PROVIDER_SITE_OTHER): Payer: Medicare Other | Admitting: Student

## 2021-04-10 ENCOUNTER — Encounter: Payer: Self-pay | Admitting: Student

## 2021-04-10 VITALS — BP 147/79 | HR 78 | Temp 97.8°F | Ht 73.0 in | Wt 180.0 lb

## 2021-04-10 DIAGNOSIS — Z89611 Acquired absence of right leg above knee: Secondary | ICD-10-CM

## 2021-04-10 DIAGNOSIS — E0800 Diabetes mellitus due to underlying condition with hyperosmolarity without nonketotic hyperglycemic-hyperosmolar coma (NKHHC): Secondary | ICD-10-CM

## 2021-04-10 DIAGNOSIS — E11622 Type 2 diabetes mellitus with other skin ulcer: Secondary | ICD-10-CM | POA: Diagnosis not present

## 2021-04-10 DIAGNOSIS — D509 Iron deficiency anemia, unspecified: Secondary | ICD-10-CM

## 2021-04-10 DIAGNOSIS — E43 Unspecified severe protein-calorie malnutrition: Secondary | ICD-10-CM | POA: Diagnosis not present

## 2021-04-10 DIAGNOSIS — I10 Essential (primary) hypertension: Secondary | ICD-10-CM

## 2021-04-10 DIAGNOSIS — G546 Phantom limb syndrome with pain: Secondary | ICD-10-CM

## 2021-04-10 LAB — POCT GLYCOSYLATED HEMOGLOBIN (HGB A1C): Hemoglobin A1C: 5.6 % (ref 4.0–5.6)

## 2021-04-10 LAB — GLUCOSE, CAPILLARY: Glucose-Capillary: 127 mg/dL — ABNORMAL HIGH (ref 70–99)

## 2021-04-10 MED ORDER — OMEPRAZOLE 40 MG PO CPDR: 40.0000 mg | DELAYED_RELEASE_CAPSULE | Freq: Every day | ORAL | 2 refills | Status: DC

## 2021-04-10 MED ORDER — ACCU-CHEK GUIDE VI STRP: ORAL_STRIP | 12 refills | Status: DC

## 2021-04-10 MED ORDER — FERROUS SULFATE 325 (65 FE) MG PO TBEC: 325.0000 mg | DELAYED_RELEASE_TABLET | ORAL | 3 refills | Status: DC

## 2021-04-10 MED ORDER — GABAPENTIN 400 MG PO CAPS: ORAL_CAPSULE | ORAL | 0 refills | Status: DC

## 2021-04-10 MED ORDER — ACCU-CHEK SOFTCLIX LANCETS MISC: 12 refills | Status: DC

## 2021-04-10 MED ORDER — ACCU-CHEK SOFTCLIX LANCETS MISC: 12 refills | Status: AC

## 2021-04-10 MED ORDER — ZINC SULFATE 220 (50 ZN) MG PO CAPS: 220.0000 mg | ORAL_CAPSULE | Freq: Every day | ORAL | 3 refills | Status: DC

## 2021-04-10 NOTE — Assessment & Plan Note (Signed)
Assessment: Current pain medication of gabapentin 300 mg twice daily.  Patient states in the past he was on 400 mg twice daily and he felt as though that was more effective.  We will increase his dosing today from 300-400   Plan: -Increase gabapentin from 300 to 400 mg twice daily -No significant improvement consider 3 times daily dosing

## 2021-04-10 NOTE — Assessment & Plan Note (Addendum)
Assessment: Patient with history of iron deficiency anemia, he was restarted on his Feraheme during his last visit 3 months ago.  If no improvement to his hemoglobin, will need repeat iron studies and possibly earlier colonoscopy.  Patient states in the past he has had polyps in his postop colonoscopies every 2 years, however with his sacral wound and osteomyelitis this was delayed.  Asked patient to follow-up with plastic surgeon as to when they think it would be best safe for patient to have a colonoscopy.  Plan: -Continue Feraheme -CBC today -If no improvement in hemoglobin with supplementation, must consider earlier colonoscopy.   Addendum: Improvement of Hgb from 9 to 12. Continue Ferrous sulfate treatment

## 2021-04-10 NOTE — Assessment & Plan Note (Signed)
BP Readings from Last 3 Encounters:  04/10/21 (!) 147/79  01/11/21 (!) 153/99  01/10/21 (!) 149/88   Assessment: Patient with elevated blood pressure during clinic appointments.  These are highly here and at home his pressures are reading 250N to 397 systolic.  Asked patient to bring in blood pressure log as well as blood pressure cuff during next visit.    Plan: -Continue amlodipine 10 mg daily -If blood pressures elevated during next appointment, consider adding losartan or HCTZ

## 2021-04-10 NOTE — Progress Notes (Signed)
CC: Diabetes, hypertension, iron deficiency anemia  HPI: [name] is a 70 y.o. male with a past medical history stated below and presents today for evaluation of his chronic conditions of diabetes, hypertension, iron deficiency anemia. Please see problem based assessment and plan for additional details.  Past Medical History:  Diagnosis Date  . Allergy   . Arthritis   . C. difficile colitis 11/2008  . Decubitus ulcer 1999   excision of right ischial pressure sore with flap reconstruction done by Dr. Denese Killings 10/31/2008  . Diabetic ketosis ([place]) 05/05/2019  . GERD (gastroesophageal reflux disease)   . Hyperlipidemia   . Hypertension   . Paraplegia ([place])    secondary to [place]  . Perineal abscess   . Substance abuse ([place])    alcohol    Current Outpatient Medications on File Prior to Visit  Medication Sig Dispense Refill  . acetaminophen (TYLENOL) 500 MG tablet Take 1,000 mg by mouth every 6 (six) hours as needed for mild pain.    Marland Kitchen amLODipine (NORVASC) 10 MG tablet Take 1 tablet (10 mg total) by mouth daily. 90 tablet 2  . clotrimazole (LOTRIMIN) 1 % cream APPLY TOPICALLY TWO TIMES DAILY. 28 g 0  . diclofenac sodium (VOLTAREN) 1 % GEL APPLY TOPICALLY 4 TIMES DAILY. (Patient taking differently: Apply 2 g topically 4 (four) times daily as needed (pain).) 100 g 1  . ezetimibe (ZETIA) 10 MG tablet Take 1 tablet (10 mg total) by mouth daily. 90 tablet 2  . glucose blood test strip Use as instructed 100 each 12  . HYDROcodone-acetaminophen (NORCO/VICODIN) 5-325 MG tablet TAKE 1-2 TABLETS BY MOUTH EVERY SIX HOURS AS NEEDED FOR UP TO 5 DAYS FOR SEVERE PAIN. 30 tablet 0  . metFORMIN (GLUCOPHAGE XR) 500 MG 24 hr tablet Take 2 tablets (1,000 mg total) by mouth in the morning and at bedtime. 120 tablet 5  . methocarbamol (ROBAXIN) 500 MG tablet TAKE 1 TABLET BY MOUTH 2 TIMES DAILY AS NEEDED FOR MUSCLE SPASMS. (Patient taking differently: Take by mouth 2 (two) times daily as needed  for muscle spasms. TAKE 1 TABLET BY MOUTH 2 TIMES DAILY AS NEEDED FOR MUSCLE SPASMS.) 60 tablet 5  . mirtazapine (REMERON) 7.5 MG tablet TAKE 1 TABLET BY MOUTH AT BEDTIME. (Patient taking differently: Take 7.5 mg by mouth at bedtime as needed (sleep).) 90 tablet 1  . Nutritional Supplements (ENSURE COMPACT) LIQD Take 237 mLs by mouth in the morning, at noon, and at bedtime. Ensure Premier---only 1gm of carbs per 8 0z    . thiamine 100 MG tablet Take 1 tablet (100 mg total) by mouth daily. 90 tablet 2  . triamcinolone (KENALOG) 0.1 % Apply topically 3 (three) times daily. To rash on arms 45 g 0   No current facility-administered medications on file prior to visit.    Family History  Problem Relation Age of Onset  . Stroke Mother   . Coronary artery disease Father   . Coronary artery disease Paternal Uncle   . Coronary artery disease Paternal Aunt     Social History   Socioeconomic History  . Marital status: Married    Spouse name: Not on file  . Number of children: 1  . Years of education: Not on file  . Highest education level: Not on file  Occupational History  . Occupation: DISABILITY    Employer: UNEMPLOYED  Tobacco Use  . Smoking status: Former Smoker    Packs/day: 0.50    Years:  40.00    Pack years: 20.00    Types: Cigarettes    Quit date: 07/09/2020    Years since quitting: 0.7  . Smokeless tobacco: Never Used  . Tobacco comment: 1 pack every 2 -3 days  Vaping Use  . Vaping Use: Never used  Substance and Sexual Activity  . Alcohol use: No    Alcohol/week: 0.0 standard drinks  . Drug use: No  . Sexual activity: Not on file  Other Topics Concern  . Not on file  Social History Narrative   Married, disabled, medicaid, functions independently at home, paraplegic      Family hx of : hypertension, diabetes, kidney disease, and other cancer   Social Determinants of Health   Financial Resource Strain: Not on file  Food Insecurity: Not on file  Transportation Needs:  Not on file  Physical Activity: Not on file  Stress: Not on file  Social Connections: Not on file  Intimate Partner Violence: Not on file    Review of Systems: ROS negative except for what is noted on the assessment and plan.  Vitals:   04/10/21 1500 04/10/21 1613  BP: (!) 160/87 (!) 147/79  Pulse: 87 78  Temp: 97.8 F (36.6 C)   TempSrc: Oral   SpO2: 95%   Weight: 180 lb (81.6 kg)   Height: 6\' 1"  (1.854 m)      Physical Exam: Constitutional: Well-appearing male in wheelchair in no acute distress HENT: normocephalic atraumatic Eyes: conjunctiva non-erythematous Cardiovascular: regular rate Pulmonary/Chest: normal work of breathing on room air MSK: normal bulk and tone, right-sided AKA Neurological: alert & oriented x 3 Skin: warm and dry Psych: Normal mood and thought process   Assessment & Plan:   See Encounters Tab for problem based charting.  Patient discussed with Dr. Caffie Damme, D.O. [facility], PGY-1 Pager: [phone], Phone: [phone] Date 04/10/2021 Time 5:17 PM

## 2021-04-10 NOTE — Assessment & Plan Note (Addendum)
Assessment: A1c today of 5.6 from 5.5.  Diabetes is well controlled on Feliz milligrams metformin XR twice daily.  Will check microalbumin creatinine ratio, if no evidence of kidney damage can decrease metformin during next appointment.   Plan: -Repeat A1c in 6 months. -If no evidence of kidney damage from diabetes, consider decreasing metformin  Addendum Microalbumin to creatinine ratio of 648.  Plan to add ARB during next follow-up visit.

## 2021-04-10 NOTE — Patient Instructions (Signed)
Thank you, Shawn [name]. for allowing [place] to provide your care today. Today we discussed   Blood pressure Please bring in your blood pressure logs to your next visit as well as your blood pressure cuff.  Diabetes Your A1c has improved.  We will continue on your diabetes medication.  We checked your urine studies today and if these are doing well, we will lower your metformin dose.  Amputation pain We will go up on your gabapentin to 400 mg twice a day  Medication refills I have refilled your ferrous sulfate, lancets and strips and your omeprazole.  I have ordered the following labs for you:   Lab Orders     Microalbumin / Creatinine Urine Ratio     CBC no Diff     Glucose, capillary     POC Hbg A1C    Referrals ordered today:   Referral Orders  No referral(s) requested today     I have ordered the following medication/changed the following medications:   Stop the following medications: Medications Discontinued During This Encounter  Medication Reason  . gabapentin (NEURONTIN) 300 MG capsule   . ferrous sulfate 325 (65 FE) MG EC tablet   . omeprazole (PRILOSEC) 40 MG capsule Reorder  . gabapentin (NEURONTIN) 400 MG capsule Reorder  . zinc sulfate 220 (50 Zn) MG capsule      Start the following medications: Meds ordered this encounter  Medications  . omeprazole (PRILOSEC) 40 MG capsule    Sig: Take 1 capsule (40 mg total) by mouth daily.    Dispense:  90 capsule    Refill:  2  . gabapentin (NEURONTIN) 400 MG capsule    Sig: TAKE 1 CAPSULE (400 MG TOTAL) BY MOUTH TWO TIMES DAILY.    Dispense:  60 capsule    Refill:  0  . DISCONTD: zinc sulfate 220 (50 Zn) MG capsule    Sig: Take 1 capsule (220 mg total) by mouth daily.    Dispense:  100 capsule    Refill:  3  . ferrous sulfate 325 (65 FE) MG EC tablet    Sig: Take 1 tablet (325 mg total) by mouth every other day.    Dispense:  45 tablet    Refill:  3     Follow up: 4-6 months     Should you have  any questions or concerns please call the internal medicine clinic at [phone].     Sanjuana Letters, D.O. [place]

## 2021-04-11 LAB — MICROALBUMIN / CREATININE URINE RATIO
Creatinine, Urine: 51.4 mg/dL
Microalb/Creat Ratio: 648 mg/g creat — ABNORMAL HIGH (ref 0–29)
Microalbumin, Urine: 333.3 ug/mL

## 2021-04-11 LAB — CBC
Hematocrit: 43.3 % (ref 37.5–51.0)
Hemoglobin: 12.8 g/dL — ABNORMAL LOW (ref 13.0–17.7)
MCH: 21.4 pg — ABNORMAL LOW (ref 26.6–33.0)
MCHC: 29.6 g/dL — ABNORMAL LOW (ref 31.5–35.7)
MCV: 72 fL — ABNORMAL LOW (ref 79–97)
Platelets: 441 10*3/uL (ref 150–450)
RBC: 5.98 x10E6/uL — ABNORMAL HIGH (ref 4.14–5.80)
RDW: 20.8 % — ABNORMAL HIGH (ref 11.6–15.4)
WBC: 6.7 10*3/uL (ref 3.4–10.8)

## 2021-04-12 NOTE — Progress Notes (Signed)
Internal Medicine Clinic Attending  Case discussed with Dr. Katsadouros  At the time of the visit.  We reviewed the resident's history and exam and pertinent patient test results.  I agree with the assessment, diagnosis, and plan of care documented in the resident's note.  

## 2021-04-22 ENCOUNTER — Encounter: Payer: Self-pay | Admitting: *Deleted

## 2021-04-22 NOTE — Progress Notes (Signed)

## 2021-04-23 NOTE — Progress Notes (Signed)
Things That May Be Affecting Your Health:  Alcohol  Hearing loss  Pain    Depression  Home Safety  Sexual Health  + Diabetes + Lack of physical activity  Stress  + Difficulty with daily activities  Loneliness  Tiredness   Drug use  Medicines  Tobacco use   Falls  Motor Vehicle Safety  Weight   Food choices  Oral Health  Other    YOUR PERSONALIZED HEALTH PLAN : 1. Schedule your next subsequent Medicare Wellness visit in one year 2. Attend all of your regular appointments to address your medical issues 3. Complete the preventative screenings and services   Annual Wellness Visit   Medicare Covered Preventative Screenings and [place] Men and Women Who How Often Need? Date of Last Service Action  Abdominal Aortic Aneurysm Adults with AAA risk factors Once      Alcohol Misuse and Counseling All Adults Screening once a year if no alcohol misuse. Counseling up to 4 face to face sessions.     Bone Density Measurement  Adults at risk for osteoporosis Once every 2 yrs      Lipid Panel Z13.6 All adults without CV disease Once every 5 yrs       Colorectal Cancer   Stool sample or  Colonoscopy All adults 72 and older   Once every year  Every 10 years        Depression All Adults Once a year  Today   Diabetes Screening Blood glucose, post glucose load, or GTT Z13.1  All adults at risk  Pre-diabetics  Once per year  Twice per year      Diabetes  Self-Management Training All adults Diabetics 10 hrs first year; 2 hours subsequent years. Requires Copay     Glaucoma  Diabetics  Family history of glaucoma  African Americans 26 yrs +  Hispanic Americans 68 yrs + Annually - requires coppay      Hepatitis C Z72.89 or F19.20  High Risk for HCV  Born between 1945 and 1965  Annually  Once      HIV Z11.4 All adults based on risk  Annually btw ages 29 & 64 regardless of risk  Annually > 65 yrs if at increased risk      Lung Cancer Screening  Asymptomatic adults aged 74-77 with 30 pack yr history and current smoker OR quit within the last 15 yrs Annually Must have counseling and shared decision making documentation before first screen      Medical Nutrition Therapy Adults with   Diabetes  Renal disease  Kidney transplant within past 3 yrs 3 hours first year; 2 hours subsequent years     Obesity and Counseling All adults Screening once a year Counseling if BMI 30 or higher  Today   Tobacco Use Counseling Adults who use tobacco  Up to 8 visits in one year     Vaccines Z23  Hepatitis B  Influenza   Pneumonia  Adults   Once  Once every flu season  Two different vaccines separated by one year     Next Annual Wellness Visit People with Medicare Every year  Today     COVID booster shot  Services & Screenings Women Who How Often Need  Date of Last Service Action  Mammogram  Z12.31 Women over 35 One baseline ages 87-39. Annually ager 40 yrs+      Pap tests All women Annually if high risk. Every 2 yrs for normal risk women  Screening for cervical cancer with   Pap (Z01.419 nl or Z01.411abnl) &  HPV Z11.51 Women aged 36 to 56 Once every 5 yrs     Screening pelvic and breast exams All women Annually if high risk. Every 2 yrs for normal risk women     Sexually Transmitted Diseases  Chlamydia  Gonorrhea  Syphilis All at risk adults Annually for non pregnant females at increased risk         [place] Men Who How Ofter Need  Date of Last Service Action  Prostate Cancer - DRE & PSA Men over 50 Annually.  DRE might require a copay.        Sexually Transmitted Diseases  Syphilis All at risk adults Annually for men at increased risk      Health Maintenance List Health Maintenance  Topic Date Due  . LIPID PANEL  05/18/2020  . COVID-19 Vaccine (3 - Booster for Moderna series) 07/22/2020  . INFLUENZA VACCINE  07/09/2021  . HEMOGLOBIN A1C  10/11/2021  . TETANUS/TDAP  11/28/2021  .  OPHTHALMOLOGY EXAM  01/30/2022  . FOOT EXAM  04/10/2022  . URINE MICROALBUMIN  04/10/2022  . COLONOSCOPY (Pts 45-60yrs Insurance coverage will need to be confirmed)  08/28/2027  . Hepatitis C Screening  Completed  . PNA vac Low Risk Adult  Completed  . HPV VACCINES  Aged Out

## 2021-04-25 DIAGNOSIS — E46 Unspecified protein-calorie malnutrition: Secondary | ICD-10-CM | POA: Diagnosis not present

## 2021-04-25 DIAGNOSIS — L89153 Pressure ulcer of sacral region, stage 3: Secondary | ICD-10-CM | POA: Diagnosis not present

## 2021-04-25 DIAGNOSIS — L89313 Pressure ulcer of right buttock, stage 3: Secondary | ICD-10-CM | POA: Diagnosis not present

## 2021-04-25 DIAGNOSIS — L89154 Pressure ulcer of sacral region, stage 4: Secondary | ICD-10-CM | POA: Diagnosis not present

## 2021-04-25 DIAGNOSIS — L89314 Pressure ulcer of right buttock, stage 4: Secondary | ICD-10-CM | POA: Diagnosis not present

## 2021-04-30 ENCOUNTER — Other Ambulatory Visit: Payer: Self-pay | Admitting: Student

## 2021-04-30 DIAGNOSIS — E43 Unspecified severe protein-calorie malnutrition: Secondary | ICD-10-CM

## 2021-05-01 DIAGNOSIS — N312 Flaccid neuropathic bladder, not elsewhere classified: Secondary | ICD-10-CM | POA: Diagnosis not present

## 2021-05-23 DIAGNOSIS — L89314 Pressure ulcer of right buttock, stage 4: Secondary | ICD-10-CM | POA: Diagnosis not present

## 2021-05-23 DIAGNOSIS — L89153 Pressure ulcer of sacral region, stage 3: Secondary | ICD-10-CM | POA: Diagnosis not present

## 2021-05-23 DIAGNOSIS — E46 Unspecified protein-calorie malnutrition: Secondary | ICD-10-CM | POA: Diagnosis not present

## 2021-05-23 DIAGNOSIS — Z89611 Acquired absence of right leg above knee: Secondary | ICD-10-CM | POA: Diagnosis not present

## 2021-05-25 DIAGNOSIS — U071 COVID-19: Secondary | ICD-10-CM | POA: Diagnosis not present

## 2021-05-29 DIAGNOSIS — N312 Flaccid neuropathic bladder, not elsewhere classified: Secondary | ICD-10-CM | POA: Diagnosis not present

## 2021-05-30 ENCOUNTER — Telehealth: Payer: Self-pay | Admitting: Student

## 2021-05-30 NOTE — Telephone Encounter (Signed)
Pt's wife took a home Covid test last night and both her and her husband came back positive.  Per the wife she was instructed to contact her husband PCP.  Please call back.

## 2021-05-30 NOTE — Telephone Encounter (Signed)
Return call to pt; talked to pt's wife. Stated pt and she were tested using home test yesterday afternoon and came back positive. Stated several family members tested + also. Stated only symptoms have been coughing, sneezing and not feeling well which started on Saturday. Denied any other symptoms. They have been drinking plenty of water and juice. Wants to know if pt can be prescribed medication, Paxlovid? Thanks

## 2021-05-30 NOTE — Telephone Encounter (Signed)
Return call to pt's wife - informed l"latest studies show paxlovid is of no benefit in vaccinated patients. " Per Dr Laural Golden. She confirmed pt has been vaccinated. Instructed to continue rest,fluids and to call the office if his condition changes- she stated he's feeling better. She also stated her doctor ordered Paxlovid and she's vaccinated.

## 2021-05-31 NOTE — Telephone Encounter (Signed)
Pt's wife stated his symptoms started on Saturday.

## 2021-06-06 ENCOUNTER — Other Ambulatory Visit: Payer: Self-pay | Admitting: Student

## 2021-06-06 DIAGNOSIS — E43 Unspecified severe protein-calorie malnutrition: Secondary | ICD-10-CM

## 2021-06-07 ENCOUNTER — Other Ambulatory Visit: Payer: Self-pay

## 2021-06-07 ENCOUNTER — Ambulatory Visit (INDEPENDENT_AMBULATORY_CARE_PROVIDER_SITE_OTHER): Payer: Medicare Other | Admitting: Internal Medicine

## 2021-06-07 ENCOUNTER — Encounter: Payer: Self-pay | Admitting: Internal Medicine

## 2021-06-07 DIAGNOSIS — Z89611 Acquired absence of right leg above knee: Secondary | ICD-10-CM

## 2021-06-07 DIAGNOSIS — Z0000 Encounter for general adult medical examination without abnormal findings: Secondary | ICD-10-CM

## 2021-06-07 MED ORDER — GABAPENTIN 400 MG PO CAPS: ORAL_CAPSULE | ORAL | 0 refills | Status: DC

## 2021-06-07 NOTE — Progress Notes (Signed)
I discussed the AWV findings with the RN who conducted the visit. I was present in the office suite and immediately available to provide assistance and direction throughout the time the service was provided.    Harlow Ohms, DO PGY-2 IMTS

## 2021-06-07 NOTE — Patient Instructions (Addendum)
Things That May Be Affecting Your Health:   Alcohol   Hearing loss   Pain    Depression   Home Safety   Sexual Health  + Diabetes + Lack of physical activity   Stress  + Difficulty with daily activities   Loneliness   Tiredness    Drug use   Medicines   Tobacco use    Falls   Motor Vehicle Safety   Weight    Food choices   Oral Health   Other      YOUR PERSONALIZED HEALTH PLAN : 1. Schedule your next subsequent Medicare Wellness visit in one year 2. Attend all of your regular appointments to address your medical issues 3. Complete the preventative screenings and services 4. Please call back to schedule 4-6 month follow up appt with Dr. Johnney Ou ~9-10/2021 5. Please consider receiving Covid Booster vaccine and Shingrix vaccine 6. Please continue doing physical activity 15-30 minutes daily. I have provided an exercise band to increase resistance.   Annual Wellness Visit                       Medicare Covered Preventative Screenings and Services   Services & Screenings Men and Women Who How Often Need? Date of Last Service Action  Abdominal Aortic Aneurysm Adults with AAA risk factors Once        Alcohol Misuse and Counseling All Adults Screening once a year if no alcohol misuse. Counseling up to 4 face to face sessions.        Bone Density Measurement Adults at risk for osteoporosis Once every 2 yrs        Lipid Panel Z13.6 All adults without CV disease Once every 5 yrs            Colorectal Cancer Stool sample or Colonoscopy All adults 27 and older   Once every year Every 10 years              Depression All Adults Once a year   Today    Diabetes Screening Blood glucose, post glucose load, or GTT Z13.1 All adults at risk Pre-diabetics Once per year Twice per year          Diabetes  Self-Management Training All adults Diabetics 10 hrs first year; 2 hours subsequent years. Requires Copay        Glaucoma Diabetics Family history of glaucoma African Americans 37 yrs  + Hispanic Americans 19 yrs + Annually - requires coppay          Hepatitis C Z72.89 or F19.20 High Risk for HCV Born between 1945 and 1965 Annually Once          HIV Z11.4 All adults based on risk Annually btw ages 74 & 51 regardless of risk Annually > 65 yrs if at increased risk          Lung Cancer Screening Asymptomatic adults aged 24-77 with 30 pack yr history and current smoker OR quit within the last 15 yrs Annually Must have counseling and shared decision making documentation before first screen          Medical Nutrition Therapy Adults with Diabetes Renal disease Kidney transplant within past 3 yrs 3 hours first year; 2 hours subsequent years        Obesity and Counseling All adults Screening once a year Counseling if BMI 30 or higher   Today    Tobacco Use Counseling Adults who use tobacco Up to 8 visits in  one year        Vaccines Z23 Hepatitis B Influenza Pneumonia Adults   Once Once every flu season Two different vaccines separated by one year     Yearly Flu vaccine starting September 1  Next Annual Wellness Visit People with Medicare Every year   Today        COVID booster shot   [place] Women Who How Often Need Date of Last Service Action  Mammogram  Z12.31 Women over 84 One baseline ages 38-39. Annually ager 40 yrs+          Pap tests All women Annually if high risk. Every 2 yrs for normal risk women          Screening for cervical cancer with Pap (Z01.419 nl or Z01.411abnl) & HPV Z11.51 Women aged 17 to 67 Once every 5 yrs        Screening pelvic and breast exams All women Annually if high risk. Every 2 yrs for normal risk women        Sexually Transmitted Diseases Chlamydia Gonorrhea Syphilis All at risk adults Annually for non pregnant females at increased risk                [place] Men Who How Ofter Need Date of Last Service Action  Prostate Cancer - DRE & PSA Men over 50 Annually. DRE might require a copay.               Sexually Transmitted Diseases Syphilis All at risk adults Annually for men at increased risk         Fall Prevention in the Home, Adult Falls can cause injuries and can happen to people of all ages. There are many things you can do to make your home safe and to help prevent falls. Ask forhelp when making these changes. What actions can I take to prevent falls? General Instructions Use good lighting in all rooms. Replace any light bulbs that burn out. Turn on the lights in dark areas. Use night-lights. Keep items that you use often in easy-to-reach places. Lower the shelves around your home if needed. Set up your furniture so you have a clear path. Avoid moving your furniture around. Do not have throw rugs or other things on the floor that can make you trip. Avoid walking on wet floors. If any of your floors are uneven, fix them. Add color or contrast paint or tape to clearly mark and help you see: Grab bars or handrails. First and last steps of staircases. Where the edge of each step is. If you use a stepladder: Make sure that it is fully opened. Do not climb a closed stepladder. Make sure the sides of the stepladder are locked in place. Ask someone to hold the stepladder while you use it. Know where your pets are when moving through your home. What can I do in the bathroom?     Keep the floor dry. Clean up any water on the floor right away. Remove soap buildup in the tub or shower. Use nonskid mats or decals on the floor of the tub or shower. Attach bath mats securely with double-sided, nonslip rug tape. If you need to sit down in the shower, use a plastic, nonslip stool. Install grab bars by the toilet and in the tub and shower. Do not use towel bars as grab bars. What can I do in the bedroom? Make sure that you have a light by your bed that is  easy to reach. Do not use any sheets or blankets for your bed that hang to the floor. Have a firm chair with side arms  that you can use for support when you get dressed. What can I do in the kitchen? Clean up any spills right away. If you need to reach something above you, use a step stool with a grab bar. Keep electrical cords out of the way. Do not use floor polish or wax that makes floors slippery. What can I do with my stairs? Do not leave any items on the stairs. Make sure that you have a light switch at the top and the bottom of the stairs. Make sure that there are handrails on both sides of the stairs. Fix handrails that are broken or loose. Install nonslip stair treads on all your stairs. Avoid having throw rugs at the top or bottom of the stairs. Choose a carpet that does not hide the edge of the steps on the stairs. Check carpeting to make sure that it is firmly attached to the stairs. Fix carpet that is loose or worn. What can I do on the outside of my home? Use bright outdoor lighting. Fix the edges of walkways and driveways and fix any cracks. Remove anything that might make you trip as you walk through a door, such as a raised step or threshold. Trim any bushes or trees on paths to your home. Check to see if handrails are loose or broken and that both sides of all steps have handrails. Install guardrails along the edges of any raised decks and porches. Clear paths of anything that can make you trip, such as tools or rocks. Have leaves, snow, or ice cleared regularly. Use sand or salt on paths during winter. Clean up any spills in your garage right away. This includes grease or oil spills. What other actions can I take? Wear shoes that: Have a low heel. Do not wear high heels. Have rubber bottoms. Feel good on your feet and fit well. Are closed at the toe. Do not wear open-toe sandals. Use tools that help you move around if needed. These include: Canes. Walkers. Scooters. Crutches. Review your medicines with your doctor. Some medicines can make you feel dizzy. This can increase your  chance of falling. Ask your doctor what else you can do to help prevent falls. Where to find more information Centers for Disease Control and Prevention, STEAD[employer]: http://www.wolf.info/ National Institute on Aging: http://kim-miller.com/ Contact a doctor if: You are afraid of falling at home. You feel weak, drowsy, or dizzy at home. You fall at home. Summary There are many simple things that you can do to make your home safe and to help prevent falls. Ways to make your home safe include removing things that can make you trip and installing grab bars in the bathroom. Ask for help when making these changes in your home. This information is not intended to replace advice given to you by your health care provider. Make sure you discuss any questions you have with your healthcare provider. Document Revised: 06/28/2020 Document Reviewed: 06/28/2020 Elsevier Patient Education  [place] Maintenance, Male Adopting a healthy lifestyle and getting preventive care are important in promoting health and wellness. Ask your health care provider about: The right schedule for you to have regular tests and exams. Things you can do on your own to prevent diseases and keep yourself healthy. What should I know about diet, weight, and exercise? Eat a healthy diet  Eat a diet that includes plenty of vegetables, fruits, low-fat dairy products, and lean protein. Do not eat a lot of foods that are high in solid fats, added sugars, or sodium.  Maintain a healthy weight Body mass index (BMI) is a measurement that can be used to identify possible weight problems. It estimates body fat based on height and weight. Your health care provider can help determine your BMI and help you achieve or maintain ahealthy weight. Get regular exercise Get regular exercise. This is one of the most important things you can do for your health. Most adults should: Exercise for at least 150 minutes each week. The exercise should  increase your heart rate and make you sweat (moderate-intensity exercise). Do strengthening exercises at least twice a week. This is in addition to the moderate-intensity exercise. Spend less time sitting. Even light physical activity can be beneficial. Watch cholesterol and blood lipids Have your blood tested for lipids and cholesterol at 71 years of age, then havethis test every 5 years. You may need to have your cholesterol levels checked more often if: Your lipid or cholesterol levels are high. You are older than 72 years of age. You are at high risk for heart disease. What should I know about cancer screening? Many types of cancers can be detected early and may often be prevented. Depending on your health history and family history, you may need to have cancer screening at various ages. This may include screening for: Colorectal cancer. Prostate cancer. Skin cancer. Lung cancer. What should I know about heart disease, diabetes, and high blood pressure? Blood pressure and heart disease High blood pressure causes heart disease and increases the risk of stroke. This is more likely to develop in people who have high blood pressure readings, are of African descent, or are overweight. Talk with your health care provider about your target blood pressure readings. Have your blood pressure checked: Every 3-5 years if you are 32-56 years of age. Every year if you are 43 years old or older. If you are between the ages of 91 and 63 and are a current or former smoker, ask your health care provider if you should have a one-time screening for abdominal aortic aneurysm (AAA). Diabetes Have regular diabetes screenings. This checks your fasting blood sugar level. Have the screening done: Once every three years after age 3 if you are at a normal weight and have a low risk for diabetes. More often and at a younger age if you are overweight or have a high risk for diabetes. What should I know about  preventing infection? Hepatitis B If you have a higher risk for hepatitis B, you should be screened for this virus. Talk with your health care provider to find out if you are at risk forhepatitis B infection. Hepatitis C Blood testing is recommended for: Everyone born from [birth year] through 1965. Anyone with known risk factors for hepatitis C. Sexually transmitted infections (STIs) You should be screened each year for STIs, including gonorrhea and chlamydia, if: You are sexually active and are younger than 73 years of age. You are older than 72 years of age and your health care provider tells you that you are at risk for this type of infection. Your sexual activity has changed since you were last screened, and you are at increased risk for chlamydia or gonorrhea. Ask your health care provider if you are at risk. Ask your health care provider about whether you are at high risk for HIV. Your  health care provider may recommend a prescription medicine to help prevent HIV infection. If you choose to take medicine to prevent HIV, you should first get tested for HIV. You should then be tested every 3 months for as long as you are taking the medicine. Follow these instructions at home: Lifestyle Do not use any products that contain nicotine or tobacco, such as cigarettes, e-cigarettes, and chewing tobacco. If you need help quitting, ask your health care provider. Do not use street drugs. Do not share needles. Ask your health care provider for help if you need support or information about quitting drugs. Alcohol use Do not drink alcohol if your health care provider tells you not to drink. If you drink alcohol: Limit how much you have to 0-2 drinks a day. Be aware of how much alcohol is in your drink. In the [place] one drink equals one 12 oz bottle of beer (355 mL), one 5 oz glass of wine (148 mL), or one 1 oz glass of hard liquor (44 mL). General instructions Schedule regular health, dental, and eye  exams. Stay current with your vaccines. Tell your health care provider if: You often feel depressed. You have ever been abused or do not feel safe at home. Summary Adopting a healthy lifestyle and getting preventive care are important in promoting health and wellness. Follow your health care provider's instructions about healthy diet, exercising, and getting tested or screened for diseases. Follow your health care provider's instructions on monitoring your cholesterol and blood pressure. This information is not intended to replace advice given to you by your health care provider. Make sure you discuss any questions you have with your healthcare provider. Document Revised: 11/18/2018 Document Reviewed: 11/18/2018 Elsevier Patient Education  2022 [employer].
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
## 2021-06-13 DIAGNOSIS — Z20822 Contact with and (suspected) exposure to covid-19: Secondary | ICD-10-CM | POA: Diagnosis not present

## 2021-06-20 DIAGNOSIS — E46 Unspecified protein-calorie malnutrition: Secondary | ICD-10-CM | POA: Diagnosis not present

## 2021-06-20 DIAGNOSIS — L89314 Pressure ulcer of right buttock, stage 4: Secondary | ICD-10-CM | POA: Diagnosis not present

## 2021-06-20 DIAGNOSIS — L89153 Pressure ulcer of sacral region, stage 3: Secondary | ICD-10-CM | POA: Diagnosis not present

## 2021-06-20 DIAGNOSIS — L89313 Pressure ulcer of right buttock, stage 3: Secondary | ICD-10-CM | POA: Diagnosis not present

## 2021-07-03 DIAGNOSIS — N312 Flaccid neuropathic bladder, not elsewhere classified: Secondary | ICD-10-CM | POA: Diagnosis not present

## 2021-07-05 ENCOUNTER — Other Ambulatory Visit: Payer: Self-pay | Admitting: Student

## 2021-07-05 DIAGNOSIS — E0800 Diabetes mellitus due to underlying condition with hyperosmolarity without nonketotic hyperglycemic-hyperosmolar coma (NKHHC): Secondary | ICD-10-CM

## 2021-07-05 MED ORDER — METFORMIN HCL ER 500 MG PO TB24: 500.0000 mg | ORAL_TABLET | Freq: Every day | ORAL | 1 refills | Status: DC

## 2021-07-05 NOTE — Assessment & Plan Note (Signed)
Assessment: Last A1c of 5.6 (04/12/21). Patient has been titrating down on metformin, currently taking 500 mg metformin XR daily. Microalbumin/creatinine ratio of 648 during last visit. Can consider arb at follow up if room with BP.   Today adjusted his metformin to reflect true dosing of once daily rather than BID.   Plan: -continue metformin 500 mg daily

## 2021-07-07 ENCOUNTER — Other Ambulatory Visit: Payer: Self-pay | Admitting: Internal Medicine

## 2021-07-07 ENCOUNTER — Other Ambulatory Visit: Payer: Self-pay | Admitting: Student

## 2021-07-07 DIAGNOSIS — Z89611 Acquired absence of right leg above knee: Secondary | ICD-10-CM

## 2021-07-07 DIAGNOSIS — G894 Chronic pain syndrome: Secondary | ICD-10-CM

## 2021-07-11 DIAGNOSIS — U071 COVID-19: Secondary | ICD-10-CM | POA: Diagnosis not present

## 2021-07-18 DIAGNOSIS — L89314 Pressure ulcer of right buttock, stage 4: Secondary | ICD-10-CM | POA: Diagnosis not present

## 2021-07-18 DIAGNOSIS — L89313 Pressure ulcer of right buttock, stage 3: Secondary | ICD-10-CM | POA: Diagnosis not present

## 2021-07-18 DIAGNOSIS — E46 Unspecified protein-calorie malnutrition: Secondary | ICD-10-CM | POA: Diagnosis not present

## 2021-07-18 DIAGNOSIS — L89154 Pressure ulcer of sacral region, stage 4: Secondary | ICD-10-CM | POA: Diagnosis not present

## 2021-07-18 DIAGNOSIS — L89153 Pressure ulcer of sacral region, stage 3: Secondary | ICD-10-CM | POA: Diagnosis not present

## 2021-07-31 DIAGNOSIS — N312 Flaccid neuropathic bladder, not elsewhere classified: Secondary | ICD-10-CM | POA: Diagnosis not present

## 2021-08-22 DIAGNOSIS — E46 Unspecified protein-calorie malnutrition: Secondary | ICD-10-CM | POA: Diagnosis not present

## 2021-08-22 DIAGNOSIS — L89154 Pressure ulcer of sacral region, stage 4: Secondary | ICD-10-CM | POA: Diagnosis not present

## 2021-08-22 DIAGNOSIS — L89153 Pressure ulcer of sacral region, stage 3: Secondary | ICD-10-CM | POA: Diagnosis not present

## 2021-08-22 DIAGNOSIS — L89314 Pressure ulcer of right buttock, stage 4: Secondary | ICD-10-CM | POA: Diagnosis not present

## 2021-08-22 DIAGNOSIS — E0821 Diabetes mellitus due to underlying condition with diabetic nephropathy: Secondary | ICD-10-CM | POA: Diagnosis not present

## 2021-08-22 DIAGNOSIS — L89313 Pressure ulcer of right buttock, stage 3: Secondary | ICD-10-CM | POA: Diagnosis not present

## 2021-08-28 DIAGNOSIS — N312 Flaccid neuropathic bladder, not elsewhere classified: Secondary | ICD-10-CM | POA: Diagnosis not present

## 2021-09-07 DIAGNOSIS — U071 COVID-19: Secondary | ICD-10-CM | POA: Diagnosis not present

## 2021-09-25 DIAGNOSIS — N312 Flaccid neuropathic bladder, not elsewhere classified: Secondary | ICD-10-CM | POA: Diagnosis not present

## 2021-10-03 DIAGNOSIS — L89153 Pressure ulcer of sacral region, stage 3: Secondary | ICD-10-CM | POA: Diagnosis not present

## 2021-10-03 DIAGNOSIS — L89314 Pressure ulcer of right buttock, stage 4: Secondary | ICD-10-CM | POA: Diagnosis not present

## 2021-10-03 DIAGNOSIS — L89154 Pressure ulcer of sacral region, stage 4: Secondary | ICD-10-CM | POA: Diagnosis not present

## 2021-10-05 DIAGNOSIS — M160 Bilateral primary osteoarthritis of hip: Secondary | ICD-10-CM | POA: Diagnosis not present

## 2021-10-05 DIAGNOSIS — R627 Adult failure to thrive: Secondary | ICD-10-CM | POA: Diagnosis not present

## 2021-10-05 DIAGNOSIS — Z435 Encounter for attention to cystostomy: Secondary | ICD-10-CM | POA: Diagnosis not present

## 2021-10-05 DIAGNOSIS — G8220 Paraplegia, unspecified: Secondary | ICD-10-CM | POA: Diagnosis not present

## 2021-10-05 DIAGNOSIS — K219 Gastro-esophageal reflux disease without esophagitis: Secondary | ICD-10-CM | POA: Diagnosis not present

## 2021-10-05 DIAGNOSIS — L89154 Pressure ulcer of sacral region, stage 4: Secondary | ICD-10-CM | POA: Diagnosis not present

## 2021-10-05 DIAGNOSIS — Z89611 Acquired absence of right leg above knee: Secondary | ICD-10-CM | POA: Diagnosis not present

## 2021-10-05 DIAGNOSIS — I10 Essential (primary) hypertension: Secondary | ICD-10-CM | POA: Diagnosis not present

## 2021-10-05 DIAGNOSIS — Z7409 Other reduced mobility: Secondary | ICD-10-CM | POA: Diagnosis not present

## 2021-10-05 DIAGNOSIS — Z7984 Long term (current) use of oral hypoglycemic drugs: Secondary | ICD-10-CM | POA: Diagnosis not present

## 2021-10-05 DIAGNOSIS — Z466 Encounter for fitting and adjustment of urinary device: Secondary | ICD-10-CM | POA: Diagnosis not present

## 2021-10-05 DIAGNOSIS — I739 Peripheral vascular disease, unspecified: Secondary | ICD-10-CM | POA: Diagnosis not present

## 2021-10-05 DIAGNOSIS — F17210 Nicotine dependence, cigarettes, uncomplicated: Secondary | ICD-10-CM | POA: Diagnosis not present

## 2021-10-05 DIAGNOSIS — L89314 Pressure ulcer of right buttock, stage 4: Secondary | ICD-10-CM | POA: Diagnosis not present

## 2021-10-08 DIAGNOSIS — I10 Essential (primary) hypertension: Secondary | ICD-10-CM | POA: Diagnosis not present

## 2021-10-08 DIAGNOSIS — L89314 Pressure ulcer of right buttock, stage 4: Secondary | ICD-10-CM | POA: Diagnosis not present

## 2021-10-08 DIAGNOSIS — L89154 Pressure ulcer of sacral region, stage 4: Secondary | ICD-10-CM | POA: Diagnosis not present

## 2021-10-08 DIAGNOSIS — I739 Peripheral vascular disease, unspecified: Secondary | ICD-10-CM | POA: Diagnosis not present

## 2021-10-08 DIAGNOSIS — G8220 Paraplegia, unspecified: Secondary | ICD-10-CM | POA: Diagnosis not present

## 2021-10-08 DIAGNOSIS — M160 Bilateral primary osteoarthritis of hip: Secondary | ICD-10-CM | POA: Diagnosis not present

## 2021-10-09 DIAGNOSIS — I739 Peripheral vascular disease, unspecified: Secondary | ICD-10-CM | POA: Diagnosis not present

## 2021-10-09 DIAGNOSIS — M160 Bilateral primary osteoarthritis of hip: Secondary | ICD-10-CM | POA: Diagnosis not present

## 2021-10-09 DIAGNOSIS — L89314 Pressure ulcer of right buttock, stage 4: Secondary | ICD-10-CM | POA: Diagnosis not present

## 2021-10-09 DIAGNOSIS — I10 Essential (primary) hypertension: Secondary | ICD-10-CM | POA: Diagnosis not present

## 2021-10-09 DIAGNOSIS — G8220 Paraplegia, unspecified: Secondary | ICD-10-CM | POA: Diagnosis not present

## 2021-10-09 DIAGNOSIS — L89154 Pressure ulcer of sacral region, stage 4: Secondary | ICD-10-CM | POA: Diagnosis not present

## 2021-10-10 DIAGNOSIS — I739 Peripheral vascular disease, unspecified: Secondary | ICD-10-CM | POA: Diagnosis not present

## 2021-10-10 DIAGNOSIS — M160 Bilateral primary osteoarthritis of hip: Secondary | ICD-10-CM | POA: Diagnosis not present

## 2021-10-10 DIAGNOSIS — G8220 Paraplegia, unspecified: Secondary | ICD-10-CM | POA: Diagnosis not present

## 2021-10-10 DIAGNOSIS — I10 Essential (primary) hypertension: Secondary | ICD-10-CM | POA: Diagnosis not present

## 2021-10-10 DIAGNOSIS — L89154 Pressure ulcer of sacral region, stage 4: Secondary | ICD-10-CM | POA: Diagnosis not present

## 2021-10-10 DIAGNOSIS — L89314 Pressure ulcer of right buttock, stage 4: Secondary | ICD-10-CM | POA: Diagnosis not present

## 2021-10-11 DIAGNOSIS — Z20828 Contact with and (suspected) exposure to other viral communicable diseases: Secondary | ICD-10-CM | POA: Diagnosis not present

## 2021-10-12 DIAGNOSIS — L89314 Pressure ulcer of right buttock, stage 4: Secondary | ICD-10-CM | POA: Diagnosis not present

## 2021-10-12 DIAGNOSIS — G8220 Paraplegia, unspecified: Secondary | ICD-10-CM | POA: Diagnosis not present

## 2021-10-12 DIAGNOSIS — I10 Essential (primary) hypertension: Secondary | ICD-10-CM | POA: Diagnosis not present

## 2021-10-12 DIAGNOSIS — L89154 Pressure ulcer of sacral region, stage 4: Secondary | ICD-10-CM | POA: Diagnosis not present

## 2021-10-12 DIAGNOSIS — M160 Bilateral primary osteoarthritis of hip: Secondary | ICD-10-CM | POA: Diagnosis not present

## 2021-10-12 DIAGNOSIS — I739 Peripheral vascular disease, unspecified: Secondary | ICD-10-CM | POA: Diagnosis not present

## 2021-10-15 DIAGNOSIS — I10 Essential (primary) hypertension: Secondary | ICD-10-CM | POA: Diagnosis not present

## 2021-10-15 DIAGNOSIS — M160 Bilateral primary osteoarthritis of hip: Secondary | ICD-10-CM | POA: Diagnosis not present

## 2021-10-15 DIAGNOSIS — I739 Peripheral vascular disease, unspecified: Secondary | ICD-10-CM | POA: Diagnosis not present

## 2021-10-15 DIAGNOSIS — L89314 Pressure ulcer of right buttock, stage 4: Secondary | ICD-10-CM | POA: Diagnosis not present

## 2021-10-15 DIAGNOSIS — G8220 Paraplegia, unspecified: Secondary | ICD-10-CM | POA: Diagnosis not present

## 2021-10-15 DIAGNOSIS — L89154 Pressure ulcer of sacral region, stage 4: Secondary | ICD-10-CM | POA: Diagnosis not present

## 2021-10-17 DIAGNOSIS — I739 Peripheral vascular disease, unspecified: Secondary | ICD-10-CM | POA: Diagnosis not present

## 2021-10-17 DIAGNOSIS — G8220 Paraplegia, unspecified: Secondary | ICD-10-CM | POA: Diagnosis not present

## 2021-10-17 DIAGNOSIS — L89314 Pressure ulcer of right buttock, stage 4: Secondary | ICD-10-CM | POA: Diagnosis not present

## 2021-10-17 DIAGNOSIS — M160 Bilateral primary osteoarthritis of hip: Secondary | ICD-10-CM | POA: Diagnosis not present

## 2021-10-17 DIAGNOSIS — L89154 Pressure ulcer of sacral region, stage 4: Secondary | ICD-10-CM | POA: Diagnosis not present

## 2021-10-17 DIAGNOSIS — I10 Essential (primary) hypertension: Secondary | ICD-10-CM | POA: Diagnosis not present

## 2021-10-19 DIAGNOSIS — L89314 Pressure ulcer of right buttock, stage 4: Secondary | ICD-10-CM | POA: Diagnosis not present

## 2021-10-19 DIAGNOSIS — I739 Peripheral vascular disease, unspecified: Secondary | ICD-10-CM | POA: Diagnosis not present

## 2021-10-19 DIAGNOSIS — M160 Bilateral primary osteoarthritis of hip: Secondary | ICD-10-CM | POA: Diagnosis not present

## 2021-10-19 DIAGNOSIS — L89154 Pressure ulcer of sacral region, stage 4: Secondary | ICD-10-CM | POA: Diagnosis not present

## 2021-10-19 DIAGNOSIS — I10 Essential (primary) hypertension: Secondary | ICD-10-CM | POA: Diagnosis not present

## 2021-10-19 DIAGNOSIS — G8220 Paraplegia, unspecified: Secondary | ICD-10-CM | POA: Diagnosis not present

## 2021-10-22 ENCOUNTER — Other Ambulatory Visit: Payer: Self-pay | Admitting: Student

## 2021-10-22 DIAGNOSIS — L89154 Pressure ulcer of sacral region, stage 4: Secondary | ICD-10-CM | POA: Diagnosis not present

## 2021-10-22 DIAGNOSIS — G8220 Paraplegia, unspecified: Secondary | ICD-10-CM | POA: Diagnosis not present

## 2021-10-22 DIAGNOSIS — L89314 Pressure ulcer of right buttock, stage 4: Secondary | ICD-10-CM | POA: Diagnosis not present

## 2021-10-22 DIAGNOSIS — M160 Bilateral primary osteoarthritis of hip: Secondary | ICD-10-CM | POA: Diagnosis not present

## 2021-10-22 DIAGNOSIS — I10 Essential (primary) hypertension: Secondary | ICD-10-CM | POA: Diagnosis not present

## 2021-10-22 DIAGNOSIS — I739 Peripheral vascular disease, unspecified: Secondary | ICD-10-CM | POA: Diagnosis not present

## 2021-10-22 DIAGNOSIS — E785 Hyperlipidemia, unspecified: Secondary | ICD-10-CM

## 2021-10-22 NOTE — Telephone Encounter (Signed)
Patient has appointment upcoming on 11/05/2021.

## 2021-10-23 DIAGNOSIS — N312 Flaccid neuropathic bladder, not elsewhere classified: Secondary | ICD-10-CM | POA: Diagnosis not present

## 2021-10-24 ENCOUNTER — Other Ambulatory Visit: Payer: Self-pay | Admitting: Student

## 2021-10-24 DIAGNOSIS — E43 Unspecified severe protein-calorie malnutrition: Secondary | ICD-10-CM

## 2021-10-24 DIAGNOSIS — I10 Essential (primary) hypertension: Secondary | ICD-10-CM | POA: Diagnosis not present

## 2021-10-24 DIAGNOSIS — L89314 Pressure ulcer of right buttock, stage 4: Secondary | ICD-10-CM | POA: Diagnosis not present

## 2021-10-24 DIAGNOSIS — I739 Peripheral vascular disease, unspecified: Secondary | ICD-10-CM | POA: Diagnosis not present

## 2021-10-24 DIAGNOSIS — M160 Bilateral primary osteoarthritis of hip: Secondary | ICD-10-CM | POA: Diagnosis not present

## 2021-10-24 DIAGNOSIS — G8220 Paraplegia, unspecified: Secondary | ICD-10-CM | POA: Diagnosis not present

## 2021-10-24 DIAGNOSIS — L89154 Pressure ulcer of sacral region, stage 4: Secondary | ICD-10-CM | POA: Diagnosis not present

## 2021-10-26 DIAGNOSIS — L89154 Pressure ulcer of sacral region, stage 4: Secondary | ICD-10-CM | POA: Diagnosis not present

## 2021-10-26 DIAGNOSIS — M160 Bilateral primary osteoarthritis of hip: Secondary | ICD-10-CM | POA: Diagnosis not present

## 2021-10-26 DIAGNOSIS — I10 Essential (primary) hypertension: Secondary | ICD-10-CM | POA: Diagnosis not present

## 2021-10-26 DIAGNOSIS — G8220 Paraplegia, unspecified: Secondary | ICD-10-CM | POA: Diagnosis not present

## 2021-10-26 DIAGNOSIS — L89314 Pressure ulcer of right buttock, stage 4: Secondary | ICD-10-CM | POA: Diagnosis not present

## 2021-10-26 DIAGNOSIS — I739 Peripheral vascular disease, unspecified: Secondary | ICD-10-CM | POA: Diagnosis not present

## 2021-10-28 DIAGNOSIS — U071 COVID-19: Secondary | ICD-10-CM | POA: Diagnosis not present

## 2021-10-29 DIAGNOSIS — L89154 Pressure ulcer of sacral region, stage 4: Secondary | ICD-10-CM | POA: Diagnosis not present

## 2021-10-29 DIAGNOSIS — M160 Bilateral primary osteoarthritis of hip: Secondary | ICD-10-CM | POA: Diagnosis not present

## 2021-10-29 DIAGNOSIS — I10 Essential (primary) hypertension: Secondary | ICD-10-CM | POA: Diagnosis not present

## 2021-10-29 DIAGNOSIS — G8220 Paraplegia, unspecified: Secondary | ICD-10-CM | POA: Diagnosis not present

## 2021-10-29 DIAGNOSIS — L89314 Pressure ulcer of right buttock, stage 4: Secondary | ICD-10-CM | POA: Diagnosis not present

## 2021-10-29 DIAGNOSIS — I739 Peripheral vascular disease, unspecified: Secondary | ICD-10-CM | POA: Diagnosis not present

## 2021-10-31 ENCOUNTER — Other Ambulatory Visit: Payer: Self-pay | Admitting: Student

## 2021-10-31 DIAGNOSIS — L89314 Pressure ulcer of right buttock, stage 4: Secondary | ICD-10-CM | POA: Diagnosis not present

## 2021-10-31 DIAGNOSIS — L89154 Pressure ulcer of sacral region, stage 4: Secondary | ICD-10-CM | POA: Diagnosis not present

## 2021-10-31 DIAGNOSIS — G8220 Paraplegia, unspecified: Secondary | ICD-10-CM | POA: Diagnosis not present

## 2021-10-31 DIAGNOSIS — I739 Peripheral vascular disease, unspecified: Secondary | ICD-10-CM | POA: Diagnosis not present

## 2021-10-31 DIAGNOSIS — M160 Bilateral primary osteoarthritis of hip: Secondary | ICD-10-CM | POA: Diagnosis not present

## 2021-10-31 DIAGNOSIS — I10 Essential (primary) hypertension: Secondary | ICD-10-CM

## 2021-11-02 DIAGNOSIS — M160 Bilateral primary osteoarthritis of hip: Secondary | ICD-10-CM | POA: Diagnosis not present

## 2021-11-02 DIAGNOSIS — L89154 Pressure ulcer of sacral region, stage 4: Secondary | ICD-10-CM | POA: Diagnosis not present

## 2021-11-02 DIAGNOSIS — I10 Essential (primary) hypertension: Secondary | ICD-10-CM | POA: Diagnosis not present

## 2021-11-02 DIAGNOSIS — I739 Peripheral vascular disease, unspecified: Secondary | ICD-10-CM | POA: Diagnosis not present

## 2021-11-02 DIAGNOSIS — L89314 Pressure ulcer of right buttock, stage 4: Secondary | ICD-10-CM | POA: Diagnosis not present

## 2021-11-02 DIAGNOSIS — G8220 Paraplegia, unspecified: Secondary | ICD-10-CM | POA: Diagnosis not present

## 2021-11-04 DIAGNOSIS — Z466 Encounter for fitting and adjustment of urinary device: Secondary | ICD-10-CM | POA: Diagnosis not present

## 2021-11-04 DIAGNOSIS — M160 Bilateral primary osteoarthritis of hip: Secondary | ICD-10-CM | POA: Diagnosis not present

## 2021-11-04 DIAGNOSIS — G8220 Paraplegia, unspecified: Secondary | ICD-10-CM | POA: Diagnosis not present

## 2021-11-04 DIAGNOSIS — R627 Adult failure to thrive: Secondary | ICD-10-CM | POA: Diagnosis not present

## 2021-11-04 DIAGNOSIS — Z7409 Other reduced mobility: Secondary | ICD-10-CM | POA: Diagnosis not present

## 2021-11-04 DIAGNOSIS — Z435 Encounter for attention to cystostomy: Secondary | ICD-10-CM | POA: Diagnosis not present

## 2021-11-04 DIAGNOSIS — K219 Gastro-esophageal reflux disease without esophagitis: Secondary | ICD-10-CM | POA: Diagnosis not present

## 2021-11-04 DIAGNOSIS — L89154 Pressure ulcer of sacral region, stage 4: Secondary | ICD-10-CM | POA: Diagnosis not present

## 2021-11-04 DIAGNOSIS — Z7984 Long term (current) use of oral hypoglycemic drugs: Secondary | ICD-10-CM | POA: Diagnosis not present

## 2021-11-04 DIAGNOSIS — F17210 Nicotine dependence, cigarettes, uncomplicated: Secondary | ICD-10-CM | POA: Diagnosis not present

## 2021-11-04 DIAGNOSIS — Z89611 Acquired absence of right leg above knee: Secondary | ICD-10-CM | POA: Diagnosis not present

## 2021-11-04 DIAGNOSIS — L89314 Pressure ulcer of right buttock, stage 4: Secondary | ICD-10-CM | POA: Diagnosis not present

## 2021-11-04 DIAGNOSIS — I739 Peripheral vascular disease, unspecified: Secondary | ICD-10-CM | POA: Diagnosis not present

## 2021-11-04 DIAGNOSIS — I10 Essential (primary) hypertension: Secondary | ICD-10-CM | POA: Diagnosis not present

## 2021-11-05 ENCOUNTER — Other Ambulatory Visit: Payer: Self-pay | Admitting: Student

## 2021-11-05 ENCOUNTER — Other Ambulatory Visit: Payer: Self-pay

## 2021-11-05 ENCOUNTER — Encounter: Payer: Self-pay | Admitting: Student

## 2021-11-05 ENCOUNTER — Ambulatory Visit (INDEPENDENT_AMBULATORY_CARE_PROVIDER_SITE_OTHER): Payer: Medicare Other | Admitting: Student

## 2021-11-05 VITALS — BP 170/91 | HR 87 | Temp 98.7°F | Resp 28 | Ht 73.0 in

## 2021-11-05 DIAGNOSIS — I10 Essential (primary) hypertension: Secondary | ICD-10-CM

## 2021-11-05 DIAGNOSIS — Z89611 Acquired absence of right leg above knee: Secondary | ICD-10-CM | POA: Diagnosis not present

## 2021-11-05 DIAGNOSIS — E0800 Diabetes mellitus due to underlying condition with hyperosmolarity without nonketotic hyperglycemic-hyperosmolar coma (NKHHC): Secondary | ICD-10-CM

## 2021-11-05 DIAGNOSIS — E785 Hyperlipidemia, unspecified: Secondary | ICD-10-CM | POA: Diagnosis not present

## 2021-11-05 DIAGNOSIS — L89154 Pressure ulcer of sacral region, stage 4: Secondary | ICD-10-CM | POA: Diagnosis not present

## 2021-11-05 DIAGNOSIS — M160 Bilateral primary osteoarthritis of hip: Secondary | ICD-10-CM | POA: Diagnosis not present

## 2021-11-05 DIAGNOSIS — Z23 Encounter for immunization: Secondary | ICD-10-CM

## 2021-11-05 DIAGNOSIS — L89314 Pressure ulcer of right buttock, stage 4: Secondary | ICD-10-CM | POA: Diagnosis not present

## 2021-11-05 DIAGNOSIS — I739 Peripheral vascular disease, unspecified: Secondary | ICD-10-CM | POA: Diagnosis not present

## 2021-11-05 DIAGNOSIS — G8220 Paraplegia, unspecified: Secondary | ICD-10-CM | POA: Diagnosis not present

## 2021-11-05 LAB — POCT GLYCOSYLATED HEMOGLOBIN (HGB A1C): Hemoglobin A1C: 6.4 % — AB (ref 4.0–5.6)

## 2021-11-05 LAB — GLUCOSE, CAPILLARY: Glucose-Capillary: 95 mg/dL (ref 70–99)

## 2021-11-05 MED ORDER — LOSARTAN POTASSIUM 50 MG PO TABS: 50.0000 mg | ORAL_TABLET | Freq: Every day | ORAL | 1 refills | Status: DC

## 2021-11-05 MED ORDER — EZETIMIBE 10 MG PO TABS: 10.0000 mg | ORAL_TABLET | Freq: Every day | ORAL | 2 refills | Status: DC

## 2021-11-05 NOTE — Patient Instructions (Signed)
Thank you, [name] for allowing [place] to provide your care today. Today we discussed .    Diabetes You are doing well with managing this, please continue taking metformin 500 mg daily.  High blood pressure Please continue to check your blood pressure daily.  We will be starting on a new medication losartan.  Please take half a tab for the first week and then after that take 1 whole tab daily.  If you find yourself very lightheaded or dizzy check your blood pressures.  If you notice that your blood pressure is going below 120/80, please record these.  If you are feeling lightheaded and dizzy and noticed that your blood pressures are low please call our clinic.  Prior right leg amputation I will provide you with the number for the [place] clinic.  The number is [phone].  Please call and schedule appointment to be seen.  I have placed order for prosthetic accessories  I have ordered the following labs for you:   Lab Orders         Glucose, capillary         POC Hbg A1C       Referrals ordered today:   Referral Orders  No referral(s) requested today     I have ordered the following medication/changed the following medications:   Stop the following medications: Medications Discontinued During This Encounter  Medication Reason   ezetimibe (ZETIA) 10 MG tablet Reorder     Start the following medications: Meds ordered this encounter  Medications   ezetimibe (ZETIA) 10 MG tablet    Sig: Take 1 tablet (10 mg total) by mouth daily.    Dispense:  90 tablet    Refill:  2   losartan (COZAAR) 50 MG tablet    Sig: Take 1 tablet (50 mg total) by mouth daily.    Dispense:  90 tablet    Refill:  1     Follow up: 1 month follow up blood pressure    Should you have any questions or concerns please call the internal medicine clinic at [phone].    Sanjuana Letters, D.O. [address]

## 2021-11-05 NOTE — Assessment & Plan Note (Signed)
Assessment: BP remains persistently elevated during his office visits. Wife endorses BP readings with wide range of 286 to 381'R systolic. Was on HCTZ in the past, however, this was discontinued secondary to hyponatremia. Will start losartan today, have patient take half of 50 mg dose for one week and then full pill after. Will have him follow up in one month for repeat BP check. Patient given home BP log to bring in during next visit.   Instructed if having episodes of lightheadedness or dizziness to check BP and call clinic.   Plan: -continue amlodipine 10 mg, start losartan 25 mg and uptitrate to 50 mg after 1st week -follow up in 1 month -check BP at home daily.

## 2021-11-05 NOTE — Assessment & Plan Note (Signed)
Patient requesting stocking for right AKA, will place DME referral for [facility] prosthetic supplies.

## 2021-11-05 NOTE — Assessment & Plan Note (Signed)
Assessment: A1c of 6.4. Current regimen of metformin 500 mg daily. Continue on regimen and recheck A1c in 6 months.   Plan: -continue metformin 500 mg daily
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
## 2021-11-06 NOTE — Progress Notes (Signed)
Internal Medicine Clinic Attending  Case discussed with Dr. Katsadouros  At the time of the visit.  We reviewed the resident's history and exam and pertinent patient test results.  I agree with the assessment, diagnosis, and plan of care documented in the resident's note.  

## 2021-11-07 DIAGNOSIS — G8220 Paraplegia, unspecified: Secondary | ICD-10-CM | POA: Diagnosis not present

## 2021-11-07 DIAGNOSIS — L89154 Pressure ulcer of sacral region, stage 4: Secondary | ICD-10-CM | POA: Diagnosis not present

## 2021-11-07 DIAGNOSIS — I10 Essential (primary) hypertension: Secondary | ICD-10-CM | POA: Diagnosis not present

## 2021-11-07 DIAGNOSIS — M160 Bilateral primary osteoarthritis of hip: Secondary | ICD-10-CM | POA: Diagnosis not present

## 2021-11-07 DIAGNOSIS — I739 Peripheral vascular disease, unspecified: Secondary | ICD-10-CM | POA: Diagnosis not present

## 2021-11-07 DIAGNOSIS — L89314 Pressure ulcer of right buttock, stage 4: Secondary | ICD-10-CM | POA: Diagnosis not present

## 2021-11-09 DIAGNOSIS — I10 Essential (primary) hypertension: Secondary | ICD-10-CM | POA: Diagnosis not present

## 2021-11-09 DIAGNOSIS — I739 Peripheral vascular disease, unspecified: Secondary | ICD-10-CM | POA: Diagnosis not present

## 2021-11-09 DIAGNOSIS — G8220 Paraplegia, unspecified: Secondary | ICD-10-CM | POA: Diagnosis not present

## 2021-11-09 DIAGNOSIS — L89154 Pressure ulcer of sacral region, stage 4: Secondary | ICD-10-CM | POA: Diagnosis not present

## 2021-11-09 DIAGNOSIS — M160 Bilateral primary osteoarthritis of hip: Secondary | ICD-10-CM | POA: Diagnosis not present

## 2021-11-09 DIAGNOSIS — L89314 Pressure ulcer of right buttock, stage 4: Secondary | ICD-10-CM | POA: Diagnosis not present

## 2021-11-12 DIAGNOSIS — I10 Essential (primary) hypertension: Secondary | ICD-10-CM | POA: Diagnosis not present

## 2021-11-12 DIAGNOSIS — L89154 Pressure ulcer of sacral region, stage 4: Secondary | ICD-10-CM | POA: Diagnosis not present

## 2021-11-12 DIAGNOSIS — G8220 Paraplegia, unspecified: Secondary | ICD-10-CM | POA: Diagnosis not present

## 2021-11-12 DIAGNOSIS — M160 Bilateral primary osteoarthritis of hip: Secondary | ICD-10-CM | POA: Diagnosis not present

## 2021-11-12 DIAGNOSIS — I739 Peripheral vascular disease, unspecified: Secondary | ICD-10-CM | POA: Diagnosis not present

## 2021-11-12 DIAGNOSIS — L89314 Pressure ulcer of right buttock, stage 4: Secondary | ICD-10-CM | POA: Diagnosis not present

## 2021-11-14 DIAGNOSIS — I10 Essential (primary) hypertension: Secondary | ICD-10-CM | POA: Diagnosis not present

## 2021-11-14 DIAGNOSIS — L89314 Pressure ulcer of right buttock, stage 4: Secondary | ICD-10-CM | POA: Diagnosis not present

## 2021-11-14 DIAGNOSIS — I739 Peripheral vascular disease, unspecified: Secondary | ICD-10-CM | POA: Diagnosis not present

## 2021-11-14 DIAGNOSIS — M160 Bilateral primary osteoarthritis of hip: Secondary | ICD-10-CM | POA: Diagnosis not present

## 2021-11-14 DIAGNOSIS — L89154 Pressure ulcer of sacral region, stage 4: Secondary | ICD-10-CM | POA: Diagnosis not present

## 2021-11-14 DIAGNOSIS — G8220 Paraplegia, unspecified: Secondary | ICD-10-CM | POA: Diagnosis not present

## 2021-11-16 DIAGNOSIS — I739 Peripheral vascular disease, unspecified: Secondary | ICD-10-CM | POA: Diagnosis not present

## 2021-11-16 DIAGNOSIS — I10 Essential (primary) hypertension: Secondary | ICD-10-CM | POA: Diagnosis not present

## 2021-11-16 DIAGNOSIS — M160 Bilateral primary osteoarthritis of hip: Secondary | ICD-10-CM | POA: Diagnosis not present

## 2021-11-16 DIAGNOSIS — G8220 Paraplegia, unspecified: Secondary | ICD-10-CM | POA: Diagnosis not present

## 2021-11-16 DIAGNOSIS — L89154 Pressure ulcer of sacral region, stage 4: Secondary | ICD-10-CM | POA: Diagnosis not present

## 2021-11-16 DIAGNOSIS — L89314 Pressure ulcer of right buttock, stage 4: Secondary | ICD-10-CM | POA: Diagnosis not present

## 2021-11-19 DIAGNOSIS — M160 Bilateral primary osteoarthritis of hip: Secondary | ICD-10-CM | POA: Diagnosis not present

## 2021-11-19 DIAGNOSIS — L89154 Pressure ulcer of sacral region, stage 4: Secondary | ICD-10-CM | POA: Diagnosis not present

## 2021-11-19 DIAGNOSIS — L89314 Pressure ulcer of right buttock, stage 4: Secondary | ICD-10-CM | POA: Diagnosis not present

## 2021-11-19 DIAGNOSIS — G8220 Paraplegia, unspecified: Secondary | ICD-10-CM | POA: Diagnosis not present

## 2021-11-19 DIAGNOSIS — I10 Essential (primary) hypertension: Secondary | ICD-10-CM | POA: Diagnosis not present

## 2021-11-19 DIAGNOSIS — I739 Peripheral vascular disease, unspecified: Secondary | ICD-10-CM | POA: Diagnosis not present

## 2021-11-20 DIAGNOSIS — N312 Flaccid neuropathic bladder, not elsewhere classified: Secondary | ICD-10-CM | POA: Diagnosis not present

## 2021-11-21 DIAGNOSIS — M160 Bilateral primary osteoarthritis of hip: Secondary | ICD-10-CM | POA: Diagnosis not present

## 2021-11-21 DIAGNOSIS — L89154 Pressure ulcer of sacral region, stage 4: Secondary | ICD-10-CM | POA: Diagnosis not present

## 2021-11-21 DIAGNOSIS — I10 Essential (primary) hypertension: Secondary | ICD-10-CM | POA: Diagnosis not present

## 2021-11-21 DIAGNOSIS — G8220 Paraplegia, unspecified: Secondary | ICD-10-CM | POA: Diagnosis not present

## 2021-11-21 DIAGNOSIS — L89314 Pressure ulcer of right buttock, stage 4: Secondary | ICD-10-CM | POA: Diagnosis not present

## 2021-11-21 DIAGNOSIS — I739 Peripheral vascular disease, unspecified: Secondary | ICD-10-CM | POA: Diagnosis not present

## 2021-11-23 DIAGNOSIS — L89314 Pressure ulcer of right buttock, stage 4: Secondary | ICD-10-CM | POA: Diagnosis not present

## 2021-11-23 DIAGNOSIS — L89154 Pressure ulcer of sacral region, stage 4: Secondary | ICD-10-CM | POA: Diagnosis not present

## 2021-11-23 DIAGNOSIS — I739 Peripheral vascular disease, unspecified: Secondary | ICD-10-CM | POA: Diagnosis not present

## 2021-11-23 DIAGNOSIS — I10 Essential (primary) hypertension: Secondary | ICD-10-CM | POA: Diagnosis not present

## 2021-11-23 DIAGNOSIS — G8220 Paraplegia, unspecified: Secondary | ICD-10-CM | POA: Diagnosis not present

## 2021-11-23 DIAGNOSIS — M160 Bilateral primary osteoarthritis of hip: Secondary | ICD-10-CM | POA: Diagnosis not present

## 2021-11-26 DIAGNOSIS — I739 Peripheral vascular disease, unspecified: Secondary | ICD-10-CM | POA: Diagnosis not present

## 2021-11-26 DIAGNOSIS — M160 Bilateral primary osteoarthritis of hip: Secondary | ICD-10-CM | POA: Diagnosis not present

## 2021-11-26 DIAGNOSIS — I10 Essential (primary) hypertension: Secondary | ICD-10-CM | POA: Diagnosis not present

## 2021-11-26 DIAGNOSIS — L89154 Pressure ulcer of sacral region, stage 4: Secondary | ICD-10-CM | POA: Diagnosis not present

## 2021-11-26 DIAGNOSIS — L89314 Pressure ulcer of right buttock, stage 4: Secondary | ICD-10-CM | POA: Diagnosis not present

## 2021-11-26 DIAGNOSIS — G8220 Paraplegia, unspecified: Secondary | ICD-10-CM | POA: Diagnosis not present

## 2021-11-28 DIAGNOSIS — I739 Peripheral vascular disease, unspecified: Secondary | ICD-10-CM | POA: Diagnosis not present

## 2021-11-28 DIAGNOSIS — G8220 Paraplegia, unspecified: Secondary | ICD-10-CM | POA: Diagnosis not present

## 2021-11-28 DIAGNOSIS — L89154 Pressure ulcer of sacral region, stage 4: Secondary | ICD-10-CM | POA: Diagnosis not present

## 2021-11-28 DIAGNOSIS — I10 Essential (primary) hypertension: Secondary | ICD-10-CM | POA: Diagnosis not present

## 2021-11-28 DIAGNOSIS — M160 Bilateral primary osteoarthritis of hip: Secondary | ICD-10-CM | POA: Diagnosis not present

## 2021-11-28 DIAGNOSIS — L89314 Pressure ulcer of right buttock, stage 4: Secondary | ICD-10-CM | POA: Diagnosis not present

## 2021-11-29 DIAGNOSIS — Z20822 Contact with and (suspected) exposure to covid-19: Secondary | ICD-10-CM | POA: Diagnosis not present

## 2021-11-30 DIAGNOSIS — I739 Peripheral vascular disease, unspecified: Secondary | ICD-10-CM | POA: Diagnosis not present

## 2021-11-30 DIAGNOSIS — L89314 Pressure ulcer of right buttock, stage 4: Secondary | ICD-10-CM | POA: Diagnosis not present

## 2021-11-30 DIAGNOSIS — G8220 Paraplegia, unspecified: Secondary | ICD-10-CM | POA: Diagnosis not present

## 2021-11-30 DIAGNOSIS — L89154 Pressure ulcer of sacral region, stage 4: Secondary | ICD-10-CM | POA: Diagnosis not present

## 2021-11-30 DIAGNOSIS — I10 Essential (primary) hypertension: Secondary | ICD-10-CM | POA: Diagnosis not present

## 2021-11-30 DIAGNOSIS — M160 Bilateral primary osteoarthritis of hip: Secondary | ICD-10-CM | POA: Diagnosis not present

## 2021-12-04 DIAGNOSIS — G8220 Paraplegia, unspecified: Secondary | ICD-10-CM | POA: Diagnosis not present

## 2021-12-04 DIAGNOSIS — I10 Essential (primary) hypertension: Secondary | ICD-10-CM | POA: Diagnosis not present

## 2021-12-04 DIAGNOSIS — R627 Adult failure to thrive: Secondary | ICD-10-CM | POA: Diagnosis not present

## 2021-12-04 DIAGNOSIS — I739 Peripheral vascular disease, unspecified: Secondary | ICD-10-CM | POA: Diagnosis not present

## 2021-12-04 DIAGNOSIS — Z435 Encounter for attention to cystostomy: Secondary | ICD-10-CM | POA: Diagnosis not present

## 2021-12-04 DIAGNOSIS — K219 Gastro-esophageal reflux disease without esophagitis: Secondary | ICD-10-CM | POA: Diagnosis not present

## 2021-12-04 DIAGNOSIS — Z89611 Acquired absence of right leg above knee: Secondary | ICD-10-CM | POA: Diagnosis not present

## 2021-12-04 DIAGNOSIS — L89314 Pressure ulcer of right buttock, stage 4: Secondary | ICD-10-CM | POA: Diagnosis not present

## 2021-12-04 DIAGNOSIS — L89154 Pressure ulcer of sacral region, stage 4: Secondary | ICD-10-CM | POA: Diagnosis not present

## 2021-12-04 DIAGNOSIS — F17210 Nicotine dependence, cigarettes, uncomplicated: Secondary | ICD-10-CM | POA: Diagnosis not present

## 2021-12-04 DIAGNOSIS — Z466 Encounter for fitting and adjustment of urinary device: Secondary | ICD-10-CM | POA: Diagnosis not present

## 2021-12-04 DIAGNOSIS — Z993 Dependence on wheelchair: Secondary | ICD-10-CM | POA: Diagnosis not present

## 2021-12-04 DIAGNOSIS — M160 Bilateral primary osteoarthritis of hip: Secondary | ICD-10-CM | POA: Diagnosis not present

## 2021-12-04 DIAGNOSIS — Z7984 Long term (current) use of oral hypoglycemic drugs: Secondary | ICD-10-CM | POA: Diagnosis not present

## 2021-12-05 ENCOUNTER — Other Ambulatory Visit: Payer: Self-pay

## 2021-12-05 ENCOUNTER — Encounter: Payer: Self-pay | Admitting: Internal Medicine

## 2021-12-05 ENCOUNTER — Ambulatory Visit (INDEPENDENT_AMBULATORY_CARE_PROVIDER_SITE_OTHER): Payer: Medicare Other | Admitting: Internal Medicine

## 2021-12-05 VITALS — BP 137/87 | HR 78 | Temp 98.3°F

## 2021-12-05 DIAGNOSIS — L89154 Pressure ulcer of sacral region, stage 4: Secondary | ICD-10-CM | POA: Diagnosis not present

## 2021-12-05 DIAGNOSIS — I10 Essential (primary) hypertension: Secondary | ICD-10-CM | POA: Diagnosis not present

## 2021-12-05 DIAGNOSIS — M160 Bilateral primary osteoarthritis of hip: Secondary | ICD-10-CM | POA: Diagnosis not present

## 2021-12-05 DIAGNOSIS — G8220 Paraplegia, unspecified: Secondary | ICD-10-CM | POA: Diagnosis not present

## 2021-12-05 DIAGNOSIS — I739 Peripheral vascular disease, unspecified: Secondary | ICD-10-CM | POA: Diagnosis not present

## 2021-12-05 DIAGNOSIS — L89314 Pressure ulcer of right buttock, stage 4: Secondary | ICD-10-CM | POA: Diagnosis not present

## 2021-12-05 MED ORDER — LOSARTAN POTASSIUM 100 MG PO TABS: 100.0000 mg | ORAL_TABLET | Freq: Every day | ORAL | 0 refills | Status: DC
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
## 2021-12-05 NOTE — Assessment & Plan Note (Signed)
BP: 137/87   Shawn [name] states that he has been taking his losartan as instructed, particularly he started with half a tablet per day for 5 days and then increase to full tablet.  He also continues to take amlodipine daily.  He denies any difficulty with his new medications or any adverse side effects.  He denies any dizziness, chest pain, palpitations, shortness of breath.  He has no concerns at this time.  He has been taking his blood pressure at home daily with assistance of his wife.  He brought his blood pressure log with him today.    Assessment/plan: Blood pressure is significantly improved at today's office visit, but this is contradictory to his blood pressure readings at home, which demonstrate an average systolic between 703-403.  Given that his blood pressure remains above goal, will continue titrating his losartan.  Patient is in agreement with this plan.  - Increase losartan to 100 mg daily - BMP pending - Follow-up in 1 month

## 2021-12-05 NOTE — Patient Instructions (Addendum)
It was nice seeing you today! Thank you for choosing [facility] for your Primary Care.    Today we talked about:   High blood pressure:   Your blood pressure log was extremely helpful today! Continue to take your blood pressure at home once a week.   We will increase your Losartan to 100 mg daily.  I have sent in the updated prescription to the pharmacy. When you pick that up, take 1 tablet daily You can finish up the current bottle you have if you prefer. Take 2 tablets of the 50 mg daily.  We will check some blood work today

## 2021-12-06 LAB — BMP8+ANION GAP
Anion Gap: 17 mmol/L (ref 10.0–18.0)
BUN/Creatinine Ratio: 16 (ref 10–24)
BUN: 12 mg/dL (ref 8–27)
CO2: 21 mmol/L (ref 20–29)
Calcium: 8.7 mg/dL (ref 8.6–10.2)
Chloride: 99 mmol/L (ref 96–106)
Creatinine, Ser: 0.74 mg/dL — ABNORMAL LOW (ref 0.76–1.27)
Glucose: 98 mg/dL (ref 70–99)
Potassium: 4.3 mmol/L (ref 3.5–5.2)
Sodium: 137 mmol/L (ref 134–144)
eGFR: 96 mL/min/{1.73_m2} (ref >59)

## 2021-12-07 DIAGNOSIS — I10 Essential (primary) hypertension: Secondary | ICD-10-CM | POA: Diagnosis not present

## 2021-12-07 DIAGNOSIS — L89154 Pressure ulcer of sacral region, stage 4: Secondary | ICD-10-CM | POA: Diagnosis not present

## 2021-12-07 DIAGNOSIS — L89314 Pressure ulcer of right buttock, stage 4: Secondary | ICD-10-CM | POA: Diagnosis not present

## 2021-12-07 DIAGNOSIS — I739 Peripheral vascular disease, unspecified: Secondary | ICD-10-CM | POA: Diagnosis not present

## 2021-12-07 DIAGNOSIS — G8220 Paraplegia, unspecified: Secondary | ICD-10-CM | POA: Diagnosis not present

## 2021-12-07 DIAGNOSIS — M160 Bilateral primary osteoarthritis of hip: Secondary | ICD-10-CM | POA: Diagnosis not present

## 2021-12-10 DIAGNOSIS — I739 Peripheral vascular disease, unspecified: Secondary | ICD-10-CM | POA: Diagnosis not present

## 2021-12-10 DIAGNOSIS — G8220 Paraplegia, unspecified: Secondary | ICD-10-CM | POA: Diagnosis not present

## 2021-12-10 DIAGNOSIS — M160 Bilateral primary osteoarthritis of hip: Secondary | ICD-10-CM | POA: Diagnosis not present

## 2021-12-10 DIAGNOSIS — I10 Essential (primary) hypertension: Secondary | ICD-10-CM | POA: Diagnosis not present

## 2021-12-10 DIAGNOSIS — L89154 Pressure ulcer of sacral region, stage 4: Secondary | ICD-10-CM | POA: Diagnosis not present

## 2021-12-10 DIAGNOSIS — L89314 Pressure ulcer of right buttock, stage 4: Secondary | ICD-10-CM | POA: Diagnosis not present

## 2021-12-10 NOTE — Progress Notes (Signed)
Internal Medicine Clinic Attending  Case discussed with Dr. Basaraba at the time of the visit.  We reviewed the resident's history and exam and pertinent patient test results.  I agree with the assessment, diagnosis, and plan of care documented in the resident's note.    

## 2021-12-12 DIAGNOSIS — I10 Essential (primary) hypertension: Secondary | ICD-10-CM | POA: Diagnosis not present

## 2021-12-12 DIAGNOSIS — U071 COVID-19: Secondary | ICD-10-CM | POA: Diagnosis not present

## 2021-12-12 DIAGNOSIS — Z7984 Long term (current) use of oral hypoglycemic drugs: Secondary | ICD-10-CM | POA: Diagnosis not present

## 2021-12-12 DIAGNOSIS — L89154 Pressure ulcer of sacral region, stage 4: Secondary | ICD-10-CM | POA: Diagnosis not present

## 2021-12-12 DIAGNOSIS — E1169 Type 2 diabetes mellitus with other specified complication: Secondary | ICD-10-CM | POA: Diagnosis not present

## 2021-12-12 DIAGNOSIS — G8220 Paraplegia, unspecified: Secondary | ICD-10-CM | POA: Diagnosis not present

## 2021-12-12 DIAGNOSIS — M160 Bilateral primary osteoarthritis of hip: Secondary | ICD-10-CM | POA: Diagnosis not present

## 2021-12-12 DIAGNOSIS — L89314 Pressure ulcer of right buttock, stage 4: Secondary | ICD-10-CM | POA: Diagnosis not present

## 2021-12-12 DIAGNOSIS — I739 Peripheral vascular disease, unspecified: Secondary | ICD-10-CM | POA: Diagnosis not present

## 2021-12-14 DIAGNOSIS — L89154 Pressure ulcer of sacral region, stage 4: Secondary | ICD-10-CM | POA: Diagnosis not present

## 2021-12-14 DIAGNOSIS — I739 Peripheral vascular disease, unspecified: Secondary | ICD-10-CM | POA: Diagnosis not present

## 2021-12-14 DIAGNOSIS — I10 Essential (primary) hypertension: Secondary | ICD-10-CM | POA: Diagnosis not present

## 2021-12-14 DIAGNOSIS — G8220 Paraplegia, unspecified: Secondary | ICD-10-CM | POA: Diagnosis not present

## 2021-12-14 DIAGNOSIS — L89314 Pressure ulcer of right buttock, stage 4: Secondary | ICD-10-CM | POA: Diagnosis not present

## 2021-12-14 DIAGNOSIS — M160 Bilateral primary osteoarthritis of hip: Secondary | ICD-10-CM | POA: Diagnosis not present

## 2021-12-17 DIAGNOSIS — G8220 Paraplegia, unspecified: Secondary | ICD-10-CM | POA: Diagnosis not present

## 2021-12-17 DIAGNOSIS — L89154 Pressure ulcer of sacral region, stage 4: Secondary | ICD-10-CM | POA: Diagnosis not present

## 2021-12-17 DIAGNOSIS — I739 Peripheral vascular disease, unspecified: Secondary | ICD-10-CM | POA: Diagnosis not present

## 2021-12-17 DIAGNOSIS — L89314 Pressure ulcer of right buttock, stage 4: Secondary | ICD-10-CM | POA: Diagnosis not present

## 2021-12-17 DIAGNOSIS — I10 Essential (primary) hypertension: Secondary | ICD-10-CM | POA: Diagnosis not present

## 2021-12-17 DIAGNOSIS — M160 Bilateral primary osteoarthritis of hip: Secondary | ICD-10-CM | POA: Diagnosis not present

## 2021-12-18 DIAGNOSIS — N312 Flaccid neuropathic bladder, not elsewhere classified: Secondary | ICD-10-CM | POA: Diagnosis not present

## 2021-12-19 DIAGNOSIS — I10 Essential (primary) hypertension: Secondary | ICD-10-CM | POA: Diagnosis not present

## 2021-12-19 DIAGNOSIS — M160 Bilateral primary osteoarthritis of hip: Secondary | ICD-10-CM | POA: Diagnosis not present

## 2021-12-19 DIAGNOSIS — I739 Peripheral vascular disease, unspecified: Secondary | ICD-10-CM | POA: Diagnosis not present

## 2021-12-19 DIAGNOSIS — L89314 Pressure ulcer of right buttock, stage 4: Secondary | ICD-10-CM | POA: Diagnosis not present

## 2021-12-19 DIAGNOSIS — G8220 Paraplegia, unspecified: Secondary | ICD-10-CM | POA: Diagnosis not present

## 2021-12-19 DIAGNOSIS — L89154 Pressure ulcer of sacral region, stage 4: Secondary | ICD-10-CM | POA: Diagnosis not present

## 2021-12-21 DIAGNOSIS — M160 Bilateral primary osteoarthritis of hip: Secondary | ICD-10-CM | POA: Diagnosis not present

## 2021-12-21 DIAGNOSIS — I739 Peripheral vascular disease, unspecified: Secondary | ICD-10-CM | POA: Diagnosis not present

## 2021-12-21 DIAGNOSIS — G8220 Paraplegia, unspecified: Secondary | ICD-10-CM | POA: Diagnosis not present

## 2021-12-21 DIAGNOSIS — I10 Essential (primary) hypertension: Secondary | ICD-10-CM | POA: Diagnosis not present

## 2021-12-21 DIAGNOSIS — L89314 Pressure ulcer of right buttock, stage 4: Secondary | ICD-10-CM | POA: Diagnosis not present

## 2021-12-21 DIAGNOSIS — L89154 Pressure ulcer of sacral region, stage 4: Secondary | ICD-10-CM | POA: Diagnosis not present

## 2021-12-24 DIAGNOSIS — I10 Essential (primary) hypertension: Secondary | ICD-10-CM | POA: Diagnosis not present

## 2021-12-24 DIAGNOSIS — M160 Bilateral primary osteoarthritis of hip: Secondary | ICD-10-CM | POA: Diagnosis not present

## 2021-12-24 DIAGNOSIS — L89154 Pressure ulcer of sacral region, stage 4: Secondary | ICD-10-CM | POA: Diagnosis not present

## 2021-12-24 DIAGNOSIS — G8220 Paraplegia, unspecified: Secondary | ICD-10-CM | POA: Diagnosis not present

## 2021-12-24 DIAGNOSIS — L89314 Pressure ulcer of right buttock, stage 4: Secondary | ICD-10-CM | POA: Diagnosis not present

## 2021-12-24 DIAGNOSIS — I739 Peripheral vascular disease, unspecified: Secondary | ICD-10-CM | POA: Diagnosis not present

## 2021-12-26 DIAGNOSIS — M160 Bilateral primary osteoarthritis of hip: Secondary | ICD-10-CM | POA: Diagnosis not present

## 2021-12-26 DIAGNOSIS — L89314 Pressure ulcer of right buttock, stage 4: Secondary | ICD-10-CM | POA: Diagnosis not present

## 2021-12-26 DIAGNOSIS — L89154 Pressure ulcer of sacral region, stage 4: Secondary | ICD-10-CM | POA: Diagnosis not present

## 2021-12-26 DIAGNOSIS — I739 Peripheral vascular disease, unspecified: Secondary | ICD-10-CM | POA: Diagnosis not present

## 2021-12-26 DIAGNOSIS — I10 Essential (primary) hypertension: Secondary | ICD-10-CM | POA: Diagnosis not present

## 2021-12-26 DIAGNOSIS — G8220 Paraplegia, unspecified: Secondary | ICD-10-CM | POA: Diagnosis not present

## 2021-12-28 DIAGNOSIS — L89154 Pressure ulcer of sacral region, stage 4: Secondary | ICD-10-CM | POA: Diagnosis not present

## 2021-12-28 DIAGNOSIS — L89314 Pressure ulcer of right buttock, stage 4: Secondary | ICD-10-CM | POA: Diagnosis not present

## 2021-12-28 DIAGNOSIS — I10 Essential (primary) hypertension: Secondary | ICD-10-CM | POA: Diagnosis not present

## 2021-12-28 DIAGNOSIS — G8220 Paraplegia, unspecified: Secondary | ICD-10-CM | POA: Diagnosis not present

## 2021-12-28 DIAGNOSIS — I739 Peripheral vascular disease, unspecified: Secondary | ICD-10-CM | POA: Diagnosis not present

## 2021-12-28 DIAGNOSIS — M160 Bilateral primary osteoarthritis of hip: Secondary | ICD-10-CM | POA: Diagnosis not present

## 2021-12-31 DIAGNOSIS — I739 Peripheral vascular disease, unspecified: Secondary | ICD-10-CM | POA: Diagnosis not present

## 2021-12-31 DIAGNOSIS — G8220 Paraplegia, unspecified: Secondary | ICD-10-CM | POA: Diagnosis not present

## 2021-12-31 DIAGNOSIS — M8580 Other specified disorders of bone density and structure, unspecified site: Secondary | ICD-10-CM | POA: Diagnosis not present

## 2021-12-31 DIAGNOSIS — L89314 Pressure ulcer of right buttock, stage 4: Secondary | ICD-10-CM | POA: Diagnosis not present

## 2021-12-31 DIAGNOSIS — I10 Essential (primary) hypertension: Secondary | ICD-10-CM | POA: Diagnosis not present

## 2021-12-31 DIAGNOSIS — L89154 Pressure ulcer of sacral region, stage 4: Secondary | ICD-10-CM | POA: Diagnosis not present

## 2021-12-31 DIAGNOSIS — E1149 Type 2 diabetes mellitus with other diabetic neurological complication: Secondary | ICD-10-CM | POA: Diagnosis not present

## 2021-12-31 DIAGNOSIS — M160 Bilateral primary osteoarthritis of hip: Secondary | ICD-10-CM | POA: Diagnosis not present

## 2021-12-31 NOTE — Progress Notes (Signed)
Renal function and electrolytes stable.

## 2022-01-02 DIAGNOSIS — I10 Essential (primary) hypertension: Secondary | ICD-10-CM | POA: Diagnosis not present

## 2022-01-02 DIAGNOSIS — L89154 Pressure ulcer of sacral region, stage 4: Secondary | ICD-10-CM | POA: Diagnosis not present

## 2022-01-02 DIAGNOSIS — M160 Bilateral primary osteoarthritis of hip: Secondary | ICD-10-CM | POA: Diagnosis not present

## 2022-01-02 DIAGNOSIS — L89314 Pressure ulcer of right buttock, stage 4: Secondary | ICD-10-CM | POA: Diagnosis not present

## 2022-01-02 DIAGNOSIS — I739 Peripheral vascular disease, unspecified: Secondary | ICD-10-CM | POA: Diagnosis not present

## 2022-01-02 DIAGNOSIS — G8220 Paraplegia, unspecified: Secondary | ICD-10-CM | POA: Diagnosis not present

## 2022-01-03 ENCOUNTER — Other Ambulatory Visit: Payer: Self-pay | Admitting: Student

## 2022-01-03 ENCOUNTER — Other Ambulatory Visit: Payer: Self-pay | Admitting: Internal Medicine

## 2022-01-03 DIAGNOSIS — G8220 Paraplegia, unspecified: Secondary | ICD-10-CM | POA: Diagnosis not present

## 2022-01-03 DIAGNOSIS — Z466 Encounter for fitting and adjustment of urinary device: Secondary | ICD-10-CM | POA: Diagnosis not present

## 2022-01-03 DIAGNOSIS — R627 Adult failure to thrive: Secondary | ICD-10-CM | POA: Diagnosis not present

## 2022-01-03 DIAGNOSIS — M160 Bilateral primary osteoarthritis of hip: Secondary | ICD-10-CM | POA: Diagnosis not present

## 2022-01-03 DIAGNOSIS — Z993 Dependence on wheelchair: Secondary | ICD-10-CM | POA: Diagnosis not present

## 2022-01-03 DIAGNOSIS — L89314 Pressure ulcer of right buttock, stage 4: Secondary | ICD-10-CM | POA: Diagnosis not present

## 2022-01-03 DIAGNOSIS — Z89611 Acquired absence of right leg above knee: Secondary | ICD-10-CM | POA: Diagnosis not present

## 2022-01-03 DIAGNOSIS — I10 Essential (primary) hypertension: Secondary | ICD-10-CM | POA: Diagnosis not present

## 2022-01-03 DIAGNOSIS — K219 Gastro-esophageal reflux disease without esophagitis: Secondary | ICD-10-CM | POA: Diagnosis not present

## 2022-01-03 DIAGNOSIS — L89154 Pressure ulcer of sacral region, stage 4: Secondary | ICD-10-CM | POA: Diagnosis not present

## 2022-01-03 DIAGNOSIS — F17210 Nicotine dependence, cigarettes, uncomplicated: Secondary | ICD-10-CM | POA: Diagnosis not present

## 2022-01-03 DIAGNOSIS — E43 Unspecified severe protein-calorie malnutrition: Secondary | ICD-10-CM

## 2022-01-03 DIAGNOSIS — Z435 Encounter for attention to cystostomy: Secondary | ICD-10-CM | POA: Diagnosis not present

## 2022-01-03 DIAGNOSIS — I739 Peripheral vascular disease, unspecified: Secondary | ICD-10-CM | POA: Diagnosis not present

## 2022-01-03 DIAGNOSIS — Z7984 Long term (current) use of oral hypoglycemic drugs: Secondary | ICD-10-CM | POA: Diagnosis not present

## 2022-01-04 DIAGNOSIS — I739 Peripheral vascular disease, unspecified: Secondary | ICD-10-CM | POA: Diagnosis not present

## 2022-01-04 DIAGNOSIS — L89154 Pressure ulcer of sacral region, stage 4: Secondary | ICD-10-CM | POA: Diagnosis not present

## 2022-01-04 DIAGNOSIS — M160 Bilateral primary osteoarthritis of hip: Secondary | ICD-10-CM | POA: Diagnosis not present

## 2022-01-04 DIAGNOSIS — L89314 Pressure ulcer of right buttock, stage 4: Secondary | ICD-10-CM | POA: Diagnosis not present

## 2022-01-04 DIAGNOSIS — G8220 Paraplegia, unspecified: Secondary | ICD-10-CM | POA: Diagnosis not present

## 2022-01-04 DIAGNOSIS — I10 Essential (primary) hypertension: Secondary | ICD-10-CM | POA: Diagnosis not present

## 2022-01-04 DIAGNOSIS — Z20822 Contact with and (suspected) exposure to covid-19: Secondary | ICD-10-CM | POA: Diagnosis not present

## 2022-01-07 ENCOUNTER — Encounter: Payer: Self-pay | Admitting: Internal Medicine

## 2022-01-07 ENCOUNTER — Ambulatory Visit (INDEPENDENT_AMBULATORY_CARE_PROVIDER_SITE_OTHER): Payer: Medicare Other | Admitting: Internal Medicine

## 2022-01-07 DIAGNOSIS — E0800 Diabetes mellitus due to underlying condition with hyperosmolarity without nonketotic hyperglycemic-hyperosmolar coma (NKHHC): Secondary | ICD-10-CM

## 2022-01-07 DIAGNOSIS — E43 Unspecified severe protein-calorie malnutrition: Secondary | ICD-10-CM

## 2022-01-07 DIAGNOSIS — I739 Peripheral vascular disease, unspecified: Secondary | ICD-10-CM | POA: Diagnosis not present

## 2022-01-07 DIAGNOSIS — G8220 Paraplegia, unspecified: Secondary | ICD-10-CM | POA: Diagnosis not present

## 2022-01-07 DIAGNOSIS — I10 Essential (primary) hypertension: Secondary | ICD-10-CM

## 2022-01-07 DIAGNOSIS — D509 Iron deficiency anemia, unspecified: Secondary | ICD-10-CM | POA: Diagnosis not present

## 2022-01-07 DIAGNOSIS — M160 Bilateral primary osteoarthritis of hip: Secondary | ICD-10-CM | POA: Diagnosis not present

## 2022-01-07 DIAGNOSIS — L89154 Pressure ulcer of sacral region, stage 4: Secondary | ICD-10-CM | POA: Diagnosis not present

## 2022-01-07 DIAGNOSIS — L89314 Pressure ulcer of right buttock, stage 4: Secondary | ICD-10-CM | POA: Diagnosis not present

## 2022-01-07 MED ORDER — METFORMIN HCL ER 500 MG PO TB24: 500.0000 mg | ORAL_TABLET | Freq: Every day | ORAL | 1 refills | Status: DC

## 2022-01-07 MED ORDER — FERROUS SULFATE 325 (65 FE) MG PO TBEC: 325.0000 mg | DELAYED_RELEASE_TABLET | ORAL | 3 refills | Status: DC

## 2022-01-07 MED ORDER — MIRTAZAPINE 7.5 MG PO TABS: 7.5000 mg | ORAL_TABLET | Freq: Every evening | ORAL | 2 refills | Status: DC | PRN

## 2022-01-08 NOTE — Assessment & Plan Note (Signed)
Vitals:   01/07/22 1532  BP: 138/76   Blood pressure slightly above goal. He has made significant improvement over the past few months. He is on amlodipine 10 mg and losartan 100 mg daily. He is tolerating these medications well. He is hesitant to add any additional medications at this time. He is anticipating a surgery of his sacral wounds in the upcoming weeks. I recommended he continue his current regimen and follow up about a month after his surgery to see how his blood pressure is doing. In the meantime he will continue checking his pressures at home a few times a week.      - BMP wnl - Continue amlodipine and losartan 100 mg daily - Follow up in ~41months
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
## 2022-01-09 DIAGNOSIS — L89314 Pressure ulcer of right buttock, stage 4: Secondary | ICD-10-CM | POA: Diagnosis not present

## 2022-01-09 DIAGNOSIS — L89154 Pressure ulcer of sacral region, stage 4: Secondary | ICD-10-CM | POA: Diagnosis not present

## 2022-01-09 DIAGNOSIS — M869 Osteomyelitis, unspecified: Secondary | ICD-10-CM | POA: Diagnosis not present

## 2022-01-09 NOTE — Progress Notes (Signed)
Internal Medicine Clinic Attending ° °Case discussed with Dr. Rehman  At the time of the visit.  We reviewed the resident’s history and exam and pertinent patient test results.  I agree with the assessment, diagnosis, and plan of care documented in the resident’s note.  ° °

## 2022-01-10 DIAGNOSIS — Z20822 Contact with and (suspected) exposure to covid-19: Secondary | ICD-10-CM | POA: Diagnosis not present

## 2022-01-11 DIAGNOSIS — I739 Peripheral vascular disease, unspecified: Secondary | ICD-10-CM | POA: Diagnosis not present

## 2022-01-11 DIAGNOSIS — L89314 Pressure ulcer of right buttock, stage 4: Secondary | ICD-10-CM | POA: Diagnosis not present

## 2022-01-11 DIAGNOSIS — G8220 Paraplegia, unspecified: Secondary | ICD-10-CM | POA: Diagnosis not present

## 2022-01-11 DIAGNOSIS — L89154 Pressure ulcer of sacral region, stage 4: Secondary | ICD-10-CM | POA: Diagnosis not present

## 2022-01-11 DIAGNOSIS — M160 Bilateral primary osteoarthritis of hip: Secondary | ICD-10-CM | POA: Diagnosis not present

## 2022-01-11 DIAGNOSIS — I10 Essential (primary) hypertension: Secondary | ICD-10-CM | POA: Diagnosis not present

## 2022-01-14 DIAGNOSIS — L89314 Pressure ulcer of right buttock, stage 4: Secondary | ICD-10-CM | POA: Diagnosis not present

## 2022-01-14 DIAGNOSIS — I10 Essential (primary) hypertension: Secondary | ICD-10-CM | POA: Diagnosis not present

## 2022-01-14 DIAGNOSIS — I739 Peripheral vascular disease, unspecified: Secondary | ICD-10-CM | POA: Diagnosis not present

## 2022-01-14 DIAGNOSIS — M160 Bilateral primary osteoarthritis of hip: Secondary | ICD-10-CM | POA: Diagnosis not present

## 2022-01-14 DIAGNOSIS — G8220 Paraplegia, unspecified: Secondary | ICD-10-CM | POA: Diagnosis not present

## 2022-01-14 DIAGNOSIS — L89154 Pressure ulcer of sacral region, stage 4: Secondary | ICD-10-CM | POA: Diagnosis not present

## 2022-01-15 DIAGNOSIS — N312 Flaccid neuropathic bladder, not elsewhere classified: Secondary | ICD-10-CM | POA: Diagnosis not present

## 2022-01-16 DIAGNOSIS — G8220 Paraplegia, unspecified: Secondary | ICD-10-CM | POA: Diagnosis not present

## 2022-01-16 DIAGNOSIS — I10 Essential (primary) hypertension: Secondary | ICD-10-CM | POA: Diagnosis not present

## 2022-01-16 DIAGNOSIS — L89314 Pressure ulcer of right buttock, stage 4: Secondary | ICD-10-CM | POA: Diagnosis not present

## 2022-01-16 DIAGNOSIS — M160 Bilateral primary osteoarthritis of hip: Secondary | ICD-10-CM | POA: Diagnosis not present

## 2022-01-16 DIAGNOSIS — I739 Peripheral vascular disease, unspecified: Secondary | ICD-10-CM | POA: Diagnosis not present

## 2022-01-16 DIAGNOSIS — L89154 Pressure ulcer of sacral region, stage 4: Secondary | ICD-10-CM | POA: Diagnosis not present

## 2022-01-22 DIAGNOSIS — M160 Bilateral primary osteoarthritis of hip: Secondary | ICD-10-CM | POA: Diagnosis not present

## 2022-01-22 DIAGNOSIS — I10 Essential (primary) hypertension: Secondary | ICD-10-CM | POA: Diagnosis not present

## 2022-01-22 DIAGNOSIS — G8220 Paraplegia, unspecified: Secondary | ICD-10-CM | POA: Diagnosis not present

## 2022-01-22 DIAGNOSIS — L89154 Pressure ulcer of sacral region, stage 4: Secondary | ICD-10-CM | POA: Diagnosis not present

## 2022-01-22 DIAGNOSIS — L89314 Pressure ulcer of right buttock, stage 4: Secondary | ICD-10-CM | POA: Diagnosis not present

## 2022-01-22 DIAGNOSIS — I739 Peripheral vascular disease, unspecified: Secondary | ICD-10-CM | POA: Diagnosis not present

## 2022-01-24 DIAGNOSIS — I10 Essential (primary) hypertension: Secondary | ICD-10-CM | POA: Diagnosis not present

## 2022-01-24 DIAGNOSIS — L89314 Pressure ulcer of right buttock, stage 4: Secondary | ICD-10-CM | POA: Diagnosis not present

## 2022-01-24 DIAGNOSIS — L89154 Pressure ulcer of sacral region, stage 4: Secondary | ICD-10-CM | POA: Diagnosis not present

## 2022-01-24 DIAGNOSIS — M160 Bilateral primary osteoarthritis of hip: Secondary | ICD-10-CM | POA: Diagnosis not present

## 2022-01-24 DIAGNOSIS — G8220 Paraplegia, unspecified: Secondary | ICD-10-CM | POA: Diagnosis not present

## 2022-01-24 DIAGNOSIS — I739 Peripheral vascular disease, unspecified: Secondary | ICD-10-CM | POA: Diagnosis not present

## 2022-01-27 ENCOUNTER — Other Ambulatory Visit: Payer: Self-pay | Admitting: Internal Medicine

## 2022-01-28 NOTE — Telephone Encounter (Signed)
losartan (COZAAR) 100 MG tablet, refill request @ [id number] - [place], [place] - [address] [place].

## 2022-01-29 DIAGNOSIS — G8220 Paraplegia, unspecified: Secondary | ICD-10-CM | POA: Diagnosis not present

## 2022-01-29 DIAGNOSIS — L89154 Pressure ulcer of sacral region, stage 4: Secondary | ICD-10-CM | POA: Diagnosis not present

## 2022-01-29 DIAGNOSIS — I10 Essential (primary) hypertension: Secondary | ICD-10-CM | POA: Diagnosis not present

## 2022-01-29 DIAGNOSIS — L89314 Pressure ulcer of right buttock, stage 4: Secondary | ICD-10-CM | POA: Diagnosis not present

## 2022-01-29 DIAGNOSIS — I739 Peripheral vascular disease, unspecified: Secondary | ICD-10-CM | POA: Diagnosis not present

## 2022-01-29 DIAGNOSIS — M160 Bilateral primary osteoarthritis of hip: Secondary | ICD-10-CM | POA: Diagnosis not present

## 2022-01-30 DIAGNOSIS — E119 Type 2 diabetes mellitus without complications: Secondary | ICD-10-CM | POA: Diagnosis not present

## 2022-01-30 DIAGNOSIS — H25813 Combined forms of age-related cataract, bilateral: Secondary | ICD-10-CM | POA: Diagnosis not present

## 2022-01-30 LAB — HM DIABETES EYE EXAM

## 2022-01-31 DIAGNOSIS — I10 Essential (primary) hypertension: Secondary | ICD-10-CM | POA: Diagnosis not present

## 2022-01-31 DIAGNOSIS — M160 Bilateral primary osteoarthritis of hip: Secondary | ICD-10-CM | POA: Diagnosis not present

## 2022-01-31 DIAGNOSIS — L89154 Pressure ulcer of sacral region, stage 4: Secondary | ICD-10-CM | POA: Diagnosis not present

## 2022-01-31 DIAGNOSIS — I739 Peripheral vascular disease, unspecified: Secondary | ICD-10-CM | POA: Diagnosis not present

## 2022-01-31 DIAGNOSIS — L89314 Pressure ulcer of right buttock, stage 4: Secondary | ICD-10-CM | POA: Diagnosis not present

## 2022-01-31 DIAGNOSIS — G8220 Paraplegia, unspecified: Secondary | ICD-10-CM | POA: Diagnosis not present

## 2022-02-06 DIAGNOSIS — U071 COVID-19: Secondary | ICD-10-CM | POA: Diagnosis not present

## 2022-02-06 DIAGNOSIS — L89313 Pressure ulcer of right buttock, stage 3: Secondary | ICD-10-CM | POA: Diagnosis not present

## 2022-02-06 DIAGNOSIS — L89154 Pressure ulcer of sacral region, stage 4: Secondary | ICD-10-CM | POA: Diagnosis not present

## 2022-02-06 DIAGNOSIS — L89314 Pressure ulcer of right buttock, stage 4: Secondary | ICD-10-CM | POA: Diagnosis not present

## 2022-02-11 ENCOUNTER — Other Ambulatory Visit: Payer: Self-pay | Admitting: Student

## 2022-02-11 DIAGNOSIS — E0800 Diabetes mellitus due to underlying condition with hyperosmolarity without nonketotic hyperglycemic-hyperosmolar coma (NKHHC): Secondary | ICD-10-CM

## 2022-02-12 DIAGNOSIS — N312 Flaccid neuropathic bladder, not elsewhere classified: Secondary | ICD-10-CM | POA: Diagnosis not present

## 2022-02-14 ENCOUNTER — Other Ambulatory Visit: Payer: Self-pay | Admitting: Student

## 2022-02-14 DIAGNOSIS — E0800 Diabetes mellitus due to underlying condition with hyperosmolarity without nonketotic hyperglycemic-hyperosmolar coma (NKHHC): Secondary | ICD-10-CM

## 2022-02-14 DIAGNOSIS — Z20822 Contact with and (suspected) exposure to covid-19: Secondary | ICD-10-CM | POA: Diagnosis not present

## 2022-02-14 NOTE — Telephone Encounter (Signed)
Call to [facility].  Did not receive previous prescription for Test Strips.  Please resend and include Diagnosis Code for. ?

## 2022-02-15 ENCOUNTER — Other Ambulatory Visit: Payer: Self-pay | Admitting: *Deleted

## 2022-02-15 DIAGNOSIS — E0800 Diabetes mellitus due to underlying condition with hyperosmolarity without nonketotic hyperglycemic-hyperosmolar coma (NKHHC): Secondary | ICD-10-CM

## 2022-02-15 MED ORDER — ACCU-CHEK GUIDE VI STRP: ORAL_STRIP | 12 refills | Status: DC

## 2022-02-15 NOTE — Telephone Encounter (Signed)
Presvious prescription did not go through.  Need prescription for the strips sent along with Diagnosis Code to the Pharmacy for refill. ?

## 2022-02-18 DIAGNOSIS — Z20822 Contact with and (suspected) exposure to covid-19: Secondary | ICD-10-CM | POA: Diagnosis not present

## 2022-02-24 ENCOUNTER — Other Ambulatory Visit: Payer: Self-pay | Admitting: Student

## 2022-02-24 DIAGNOSIS — Z89611 Acquired absence of right leg above knee: Secondary | ICD-10-CM

## 2022-02-24 DIAGNOSIS — G8220 Paraplegia, unspecified: Secondary | ICD-10-CM

## 2022-02-24 DIAGNOSIS — G546 Phantom limb syndrome with pain: Secondary | ICD-10-CM

## 2022-02-25 ENCOUNTER — Telehealth: Payer: Self-pay

## 2022-02-25 DIAGNOSIS — E0800 Diabetes mellitus due to underlying condition with hyperosmolarity without nonketotic hyperglycemic-hyperosmolar coma (NKHHC): Secondary | ICD-10-CM

## 2022-02-25 MED ORDER — ACCU-CHEK GUIDE VI STRP: ORAL_STRIP | 12 refills | Status: AC

## 2022-02-25 NOTE — Telephone Encounter (Signed)
Prescription written for glucose strips with instructions in sig section.  ?

## 2022-03-06 DIAGNOSIS — Z20822 Contact with and (suspected) exposure to covid-19: Secondary | ICD-10-CM | POA: Diagnosis not present

## 2022-03-09 DIAGNOSIS — Z20822 Contact with and (suspected) exposure to covid-19: Secondary | ICD-10-CM | POA: Diagnosis not present

## 2022-03-13 DIAGNOSIS — L89314 Pressure ulcer of right buttock, stage 4: Secondary | ICD-10-CM | POA: Diagnosis not present

## 2022-03-19 DIAGNOSIS — N312 Flaccid neuropathic bladder, not elsewhere classified: Secondary | ICD-10-CM | POA: Diagnosis not present

## 2022-03-22 DIAGNOSIS — Z20822 Contact with and (suspected) exposure to covid-19: Secondary | ICD-10-CM | POA: Diagnosis not present

## 2022-03-28 DIAGNOSIS — Z20822 Contact with and (suspected) exposure to covid-19: Secondary | ICD-10-CM | POA: Diagnosis not present

## 2022-03-29 DIAGNOSIS — Z20822 Contact with and (suspected) exposure to covid-19: Secondary | ICD-10-CM | POA: Diagnosis not present

## 2022-04-09 ENCOUNTER — Encounter: Payer: Self-pay | Admitting: Student

## 2022-04-09 ENCOUNTER — Ambulatory Visit (INDEPENDENT_AMBULATORY_CARE_PROVIDER_SITE_OTHER): Payer: Medicare Other | Admitting: Student

## 2022-04-09 DIAGNOSIS — I10 Essential (primary) hypertension: Secondary | ICD-10-CM | POA: Diagnosis not present

## 2022-04-09 DIAGNOSIS — Z20822 Contact with and (suspected) exposure to covid-19: Secondary | ICD-10-CM | POA: Diagnosis not present

## 2022-04-09 DIAGNOSIS — H269 Unspecified cataract: Secondary | ICD-10-CM | POA: Diagnosis not present

## 2022-04-09 DIAGNOSIS — F17210 Nicotine dependence, cigarettes, uncomplicated: Secondary | ICD-10-CM

## 2022-04-09 DIAGNOSIS — E43 Unspecified severe protein-calorie malnutrition: Secondary | ICD-10-CM

## 2022-04-09 DIAGNOSIS — Z720 Tobacco use: Secondary | ICD-10-CM

## 2022-04-09 DIAGNOSIS — M86659 Other chronic osteomyelitis, unspecified thigh: Secondary | ICD-10-CM

## 2022-04-09 DIAGNOSIS — M8668 Other chronic osteomyelitis, other site: Secondary | ICD-10-CM | POA: Diagnosis not present

## 2022-04-09 MED ORDER — LOSARTAN POTASSIUM 100 MG PO TABS: 100.0000 mg | ORAL_TABLET | Freq: Every day | ORAL | 3 refills | Status: DC

## 2022-04-09 MED ORDER — THIAMINE HCL 100 MG PO TABS: 100.0000 mg | ORAL_TABLET | Freq: Every day | ORAL | 3 refills | Status: AC

## 2022-04-09 MED ORDER — OMEPRAZOLE 40 MG PO CPDR: 40.0000 mg | DELAYED_RELEASE_CAPSULE | Freq: Every day | ORAL | 3 refills | Status: DC

## 2022-04-09 NOTE — Patient Instructions (Signed)
It was a pleasure seeing you in clinic. Today we discussed:  ? ?High blood pressure ?Increase losartan to 100 mg daily ?Continue amlodipine 10 mg daily ? ? ?Follow up in 1-2 months for BP follow and to recheck labs ? ?If you have any questions or concerns, please call our clinic at [phone] between 9am-5pm and after hours call [phone] and ask for the internal medicine resident on call. If you feel you are having a medical emergency please call 911.  ? ?Thank you, we look forward to helping you remain healthy! ? ? ?

## 2022-04-10 DIAGNOSIS — L89314 Pressure ulcer of right buttock, stage 4: Secondary | ICD-10-CM | POA: Diagnosis not present

## 2022-04-10 DIAGNOSIS — L89154 Pressure ulcer of sacral region, stage 4: Secondary | ICD-10-CM | POA: Diagnosis not present

## 2022-04-10 DIAGNOSIS — T25321D Burn of third degree of right foot, subsequent encounter: Secondary | ICD-10-CM | POA: Diagnosis not present

## 2022-04-10 NOTE — Assessment & Plan Note (Signed)
Has appointment with ophthalmology through [facility] to discuss cataract surgery.  He is hoping to get cataract surgery soon because he has significant difficulty seeing due to this issue.  Is only able to see large shapes but nothing else.  Plan have cataract surgery before plastic surgery for his sacral wound. ?

## 2022-04-10 NOTE — Progress Notes (Signed)
? ?Established Patient Office Visit ? ?Subjective   ?Patient ID: [name], male    DOB: Jan 12, 1951  Age: 71 y.o. MRN: [id number] ? ?Chief Complaint  ?Patient presents with  ? Hypertension  ? ? ?Goldman Birchall is a 71 year old male who presents with his wife today for follow-up of hypertension.Please refer to problem based charting for further details and assessment and plan of current problem and chronic medical conditions. ? ? ? ?Patient Active Problem List  ? Diagnosis Date Noted  ? Itchy skin 01/11/2021  ? Medication monitoring encounter 12/16/2020  ? Immunization counseling 12/16/2020  ? ESBL (extended spectrum beta-lactamase) producing bacteria infection 11/27/2020  ? Acute osteomyelitis ([place])   ? Pressure injury of skin 11/24/2020  ? Sepsis without acute organ dysfunction (HCC)   ? Atopic dermatitis 11/07/2020  ? Iron deficiency anemia 10/12/2020  ? Protein-calorie malnutrition, severe 06/01/2020  ? Phantom limb pain ([place]) 05/09/2020  ? Neurologic gait disorder 03/09/2020  ? Chronic pain syndrome 05/17/2019  ? Chronic osteomyelitis involving pelvic region and thigh ([place]) 05/06/2019  ? Diabetes mellitus ([place]) 05/05/2019  ? Bacteremia 05/19/2018  ? S/P AKA (above knee amputation) unilateral, right ([place]) 03/05/2018  ? Colon cancer screening 08/25/2017  ? Cataract 01/30/2015  ? Tobacco use 07/15/2013  ? Health care maintenance 09/29/2012  ? Decubitus ulcer of right ischium 07/14/2012  ? Hypertension 04/14/2012  ? Hyperlipidemia 12/29/2006  ? Paraplegic spinal paralysis ([place]) 12/29/2006  ? ?  ? ?Review of Systems  ?All other systems reviewed and are negative. ? ?  ?Objective:  ?  ? ?BP (!) 150/92 (BP Location: Right Arm, Cuff Size: Normal)   Pulse 79   Temp 98.3 ?F (36.8 ?C) (Oral)   Ht '6\' 1"'$  (1.854 m)   SpO2 95%   BMI 23.75 kg/m?  ?  ? ?Physical Exam ?Constitutional:   ?   Appearance: Normal appearance.  ?HENT:  ?   Head: Normocephalic and atraumatic.  ?   Mouth/Throat:  ?   Mouth: Mucous membranes are  moist.  ?   Pharynx: Oropharynx is clear.  ?Eyes:  ?   Conjunctiva/sclera: Conjunctivae normal.  ?Cardiovascular:  ?   Rate and Rhythm: Normal rate and regular rhythm.  ?   Heart sounds: No murmur heard. ?  No friction rub. No gallop.  ?Pulmonary:  ?   Effort: Pulmonary effort is normal.  ?   Breath sounds: No rhonchi or rales.  ?Abdominal:  ?   General: Abdomen is flat. Bowel sounds are normal.  ?   Palpations: Abdomen is soft.  ?Musculoskeletal:  ?   Left lower leg: No edema.  ?   Comments: S/p AKA, sitting in wheelchair  ?Skin: ?   General: Skin is warm and dry.  ?Neurological:  ?   General: No focal deficit present.  ?   Mental Status: He is alert and oriented to person, place, and time. Mental status is at baseline.  ? ? ? ?No results found for any visits on 04/09/22. ? ?  ?Assessment & Plan:  ? ?Problem List Items Addressed This Visit   ? ?  ? Cardiovascular and Mediastinum  ? Hypertension  ?  BP 152/102 and 150/92 on repeat.  Reports being tired from wheeling himself down from the lobby.  Reports compliance with his medications.  1 looking through his medication bottles appears that he is taking amlodipine 10 however has not started on his new bottle losartan 100 mg daily.  Instead he has been taking his  old bottle of losartan 50 mg daily.  Did not realize that his losartan was increased and has not been giving him 2 tablets of the 50 mg medications yet.  Otherwise he is asymptomatic and feeling well today. ? ?Plan ?Increase losartan to 100 mg daily ?Continue memantine 10 mg daily ?Follow-up blood pressure and BMP in 1 to 2 months.  Is scheduled to have multiple follow-ups for cataract surgery and wound care and surgery for chronic sacral wound and wife as he may not be able to  to follow-up in 1 month. ? ?  ?  ? Relevant Medications  ? losartan (COZAAR) 100 MG tablet  ?  ? Musculoskeletal and Integument  ? Chronic osteomyelitis involving pelvic region and thigh (HCC) (Chronic)  ?  Follows with [facility]  for wound care.  He is supposed to be evaluated for plastic surgery regarding this.  Plan to have cataract surgery prior to this.  He has surgical evaluation next month.  Wife is doing dressing changes.  Reports no increased drainage or signs of infection. ? ?Continue twice daily dressing changes ?Continue follow-up with wound care and plastic surgery ? ?  ?  ?  ? Other  ? Tobacco use  ?  Down to smoking 2 cigarettes a day.  Discussed trying to refrain from smoking 4  weeks prior to surgery to help with wound healing.  Patient feels he is unable to quit smoking. ? ?  ?  ? Cataract  ?  Has appointment with ophthalmology through [facility] to discuss cataract surgery.  He is hoping to get cataract surgery soon because he has significant difficulty seeing due to this issue.  Is only able to see large shapes but nothing else.  Plan have cataract surgery before plastic surgery for his sacral wound. ? ?  ?  ? Protein-calorie malnutrition, severe  ? Relevant Medications  ? thiamine 100 MG tablet  ? omeprazole (PRILOSEC) 40 MG capsule  ? ? ?Return in about 1 month (around 05/10/2022).  ? ? ?Iona Beard, MD ? ?

## 2022-04-10 NOTE — Progress Notes (Signed)
Internal Medicine Clinic Attending ? ?Case discussed with Dr. Liang  At the time of the visit.  We reviewed the resident?s history and exam and pertinent patient test results.  I agree with the assessment, diagnosis, and plan of care documented in the resident?s note. ? ?

## 2022-04-10 NOTE — Assessment & Plan Note (Signed)
Down to smoking 2 cigarettes a day.  Discussed trying to refrain from smoking 4  weeks prior to surgery to help with wound healing.  Patient feels he is unable to quit smoking. ?

## 2022-04-10 NOTE — Assessment & Plan Note (Signed)
Follows with [facility] for wound care.  He is supposed to be evaluated for plastic surgery regarding this.  Plan to have cataract surgery prior to this.  He has surgical evaluation next month.  Wife is doing dressing changes.  Reports no increased drainage or signs of infection. ? ?Continue twice daily dressing changes ?Continue follow-up with wound care and plastic surgery ?

## 2022-04-10 NOTE — Assessment & Plan Note (Signed)
BP 152/102 and 150/92 on repeat.  Reports being tired from wheeling himself down from the lobby.  Reports compliance with his medications.  1 looking through his medication bottles appears that he is taking amlodipine 10 however has not started on his new bottle losartan 100 mg daily.  Instead he has been taking his old bottle of losartan 50 mg daily.  Did not realize that his losartan was increased and has not been giving him 2 tablets of the 50 mg medications yet.  Otherwise he is asymptomatic and feeling well today. ? ?Plan ?Increase losartan to 100 mg daily ?Continue memantine 10 mg daily ?Follow-up blood pressure and BMP in 1 to 2 months.  Is scheduled to have multiple follow-ups for cataract surgery and wound care and surgery for chronic sacral wound and wife as he may not be able to  to follow-up in 1 month. ?

## 2022-04-11 DIAGNOSIS — Z20822 Contact with and (suspected) exposure to covid-19: Secondary | ICD-10-CM | POA: Diagnosis not present

## 2022-04-15 DIAGNOSIS — Z20828 Contact with and (suspected) exposure to other viral communicable diseases: Secondary | ICD-10-CM | POA: Diagnosis not present

## 2022-04-16 DIAGNOSIS — Z794 Long term (current) use of insulin: Secondary | ICD-10-CM | POA: Diagnosis not present

## 2022-04-16 DIAGNOSIS — H52223 Regular astigmatism, bilateral: Secondary | ICD-10-CM | POA: Diagnosis not present

## 2022-04-16 DIAGNOSIS — H25813 Combined forms of age-related cataract, bilateral: Secondary | ICD-10-CM | POA: Diagnosis not present

## 2022-04-16 DIAGNOSIS — H25042 Posterior subcapsular polar age-related cataract, left eye: Secondary | ICD-10-CM | POA: Diagnosis not present

## 2022-04-16 DIAGNOSIS — E11311 Type 2 diabetes mellitus with unspecified diabetic retinopathy with macular edema: Secondary | ICD-10-CM | POA: Diagnosis not present

## 2022-04-16 DIAGNOSIS — H2512 Age-related nuclear cataract, left eye: Secondary | ICD-10-CM | POA: Diagnosis not present

## 2022-04-16 DIAGNOSIS — E1136 Type 2 diabetes mellitus with diabetic cataract: Secondary | ICD-10-CM | POA: Diagnosis not present

## 2022-04-16 DIAGNOSIS — H25811 Combined forms of age-related cataract, right eye: Secondary | ICD-10-CM | POA: Diagnosis not present

## 2022-04-16 DIAGNOSIS — Z7984 Long term (current) use of oral hypoglycemic drugs: Secondary | ICD-10-CM | POA: Diagnosis not present

## 2022-04-17 ENCOUNTER — Other Ambulatory Visit: Payer: Self-pay | Admitting: Student

## 2022-04-17 DIAGNOSIS — D509 Iron deficiency anemia, unspecified: Secondary | ICD-10-CM

## 2022-04-23 ENCOUNTER — Encounter: Payer: Self-pay | Admitting: Dietician

## 2022-04-23 DIAGNOSIS — N139 Obstructive and reflux uropathy, unspecified: Secondary | ICD-10-CM | POA: Diagnosis not present

## 2022-05-01 ENCOUNTER — Other Ambulatory Visit: Payer: Self-pay | Admitting: Student

## 2022-05-15 DIAGNOSIS — E1169 Type 2 diabetes mellitus with other specified complication: Secondary | ICD-10-CM | POA: Diagnosis not present

## 2022-05-15 DIAGNOSIS — G8220 Paraplegia, unspecified: Secondary | ICD-10-CM | POA: Diagnosis not present

## 2022-05-15 DIAGNOSIS — L89314 Pressure ulcer of right buttock, stage 4: Secondary | ICD-10-CM | POA: Diagnosis not present

## 2022-05-15 DIAGNOSIS — E46 Unspecified protein-calorie malnutrition: Secondary | ICD-10-CM | POA: Diagnosis not present

## 2022-05-15 DIAGNOSIS — L89153 Pressure ulcer of sacral region, stage 3: Secondary | ICD-10-CM | POA: Diagnosis not present

## 2022-05-15 DIAGNOSIS — L89313 Pressure ulcer of right buttock, stage 3: Secondary | ICD-10-CM | POA: Diagnosis not present

## 2022-05-21 DIAGNOSIS — N312 Flaccid neuropathic bladder, not elsewhere classified: Secondary | ICD-10-CM | POA: Diagnosis not present

## 2022-06-06 DIAGNOSIS — H2589 Other age-related cataract: Secondary | ICD-10-CM | POA: Diagnosis not present

## 2022-06-06 DIAGNOSIS — H2511 Age-related nuclear cataract, right eye: Secondary | ICD-10-CM | POA: Diagnosis not present

## 2022-06-07 DIAGNOSIS — Z961 Presence of intraocular lens: Secondary | ICD-10-CM | POA: Diagnosis not present

## 2022-06-07 DIAGNOSIS — Z48810 Encounter for surgical aftercare following surgery on the sense organs: Secondary | ICD-10-CM | POA: Diagnosis not present

## 2022-06-07 DIAGNOSIS — Z7984 Long term (current) use of oral hypoglycemic drugs: Secondary | ICD-10-CM | POA: Diagnosis not present

## 2022-06-07 DIAGNOSIS — Z79899 Other long term (current) drug therapy: Secondary | ICD-10-CM | POA: Diagnosis not present

## 2022-06-07 DIAGNOSIS — Z794 Long term (current) use of insulin: Secondary | ICD-10-CM | POA: Diagnosis not present

## 2022-06-07 DIAGNOSIS — E1136 Type 2 diabetes mellitus with diabetic cataract: Secondary | ICD-10-CM | POA: Diagnosis not present

## 2022-06-07 DIAGNOSIS — H25042 Posterior subcapsular polar age-related cataract, left eye: Secondary | ICD-10-CM | POA: Diagnosis not present

## 2022-06-07 DIAGNOSIS — E11311 Type 2 diabetes mellitus with unspecified diabetic retinopathy with macular edema: Secondary | ICD-10-CM | POA: Diagnosis not present

## 2022-06-12 DIAGNOSIS — Z6825 Body mass index (BMI) 25.0-25.9, adult: Secondary | ICD-10-CM | POA: Diagnosis not present

## 2022-06-12 DIAGNOSIS — L89153 Pressure ulcer of sacral region, stage 3: Secondary | ICD-10-CM | POA: Diagnosis not present

## 2022-06-12 DIAGNOSIS — L89313 Pressure ulcer of right buttock, stage 3: Secondary | ICD-10-CM | POA: Diagnosis not present

## 2022-06-12 DIAGNOSIS — L89314 Pressure ulcer of right buttock, stage 4: Secondary | ICD-10-CM | POA: Diagnosis not present

## 2022-06-12 DIAGNOSIS — E46 Unspecified protein-calorie malnutrition: Secondary | ICD-10-CM | POA: Diagnosis not present

## 2022-06-14 DIAGNOSIS — H5213 Myopia, bilateral: Secondary | ICD-10-CM | POA: Diagnosis not present

## 2022-06-14 DIAGNOSIS — H25042 Posterior subcapsular polar age-related cataract, left eye: Secondary | ICD-10-CM | POA: Diagnosis not present

## 2022-06-14 DIAGNOSIS — Z48810 Encounter for surgical aftercare following surgery on the sense organs: Secondary | ICD-10-CM | POA: Diagnosis not present

## 2022-06-14 DIAGNOSIS — Z961 Presence of intraocular lens: Secondary | ICD-10-CM | POA: Diagnosis not present

## 2022-06-14 DIAGNOSIS — Z7984 Long term (current) use of oral hypoglycemic drugs: Secondary | ICD-10-CM | POA: Diagnosis not present

## 2022-06-14 DIAGNOSIS — E11311 Type 2 diabetes mellitus with unspecified diabetic retinopathy with macular edema: Secondary | ICD-10-CM | POA: Diagnosis not present

## 2022-06-14 DIAGNOSIS — H52203 Unspecified astigmatism, bilateral: Secondary | ICD-10-CM | POA: Diagnosis not present

## 2022-06-14 DIAGNOSIS — Z9841 Cataract extraction status, right eye: Secondary | ICD-10-CM | POA: Diagnosis not present

## 2022-06-14 DIAGNOSIS — Z794 Long term (current) use of insulin: Secondary | ICD-10-CM | POA: Diagnosis not present

## 2022-06-18 DIAGNOSIS — N312 Flaccid neuropathic bladder, not elsewhere classified: Secondary | ICD-10-CM | POA: Diagnosis not present

## 2022-06-20 DIAGNOSIS — H2589 Other age-related cataract: Secondary | ICD-10-CM | POA: Diagnosis not present

## 2022-06-20 DIAGNOSIS — I10 Essential (primary) hypertension: Secondary | ICD-10-CM | POA: Diagnosis not present

## 2022-06-20 DIAGNOSIS — H25812 Combined forms of age-related cataract, left eye: Secondary | ICD-10-CM | POA: Diagnosis not present

## 2022-06-20 DIAGNOSIS — E11311 Type 2 diabetes mellitus with unspecified diabetic retinopathy with macular edema: Secondary | ICD-10-CM | POA: Diagnosis not present

## 2022-06-21 DIAGNOSIS — Z794 Long term (current) use of insulin: Secondary | ICD-10-CM | POA: Diagnosis not present

## 2022-06-21 DIAGNOSIS — H5213 Myopia, bilateral: Secondary | ICD-10-CM | POA: Diagnosis not present

## 2022-06-21 DIAGNOSIS — Z48810 Encounter for surgical aftercare following surgery on the sense organs: Secondary | ICD-10-CM | POA: Diagnosis not present

## 2022-06-21 DIAGNOSIS — Z9841 Cataract extraction status, right eye: Secondary | ICD-10-CM | POA: Diagnosis not present

## 2022-06-21 DIAGNOSIS — H52203 Unspecified astigmatism, bilateral: Secondary | ICD-10-CM | POA: Diagnosis not present

## 2022-06-21 DIAGNOSIS — E119 Type 2 diabetes mellitus without complications: Secondary | ICD-10-CM | POA: Diagnosis not present

## 2022-06-21 DIAGNOSIS — Z89611 Acquired absence of right leg above knee: Secondary | ICD-10-CM | POA: Diagnosis not present

## 2022-06-21 DIAGNOSIS — Z9842 Cataract extraction status, left eye: Secondary | ICD-10-CM | POA: Diagnosis not present

## 2022-06-21 DIAGNOSIS — Z961 Presence of intraocular lens: Secondary | ICD-10-CM | POA: Diagnosis not present

## 2022-06-26 ENCOUNTER — Other Ambulatory Visit: Payer: Self-pay | Admitting: Student

## 2022-06-26 DIAGNOSIS — Z89611 Acquired absence of right leg above knee: Secondary | ICD-10-CM

## 2022-06-26 DIAGNOSIS — G546 Phantom limb syndrome with pain: Secondary | ICD-10-CM

## 2022-06-28 DIAGNOSIS — Z794 Long term (current) use of insulin: Secondary | ICD-10-CM | POA: Diagnosis not present

## 2022-06-28 DIAGNOSIS — Z961 Presence of intraocular lens: Secondary | ICD-10-CM | POA: Diagnosis not present

## 2022-06-28 DIAGNOSIS — Z7984 Long term (current) use of oral hypoglycemic drugs: Secondary | ICD-10-CM | POA: Diagnosis not present

## 2022-06-28 DIAGNOSIS — Z48810 Encounter for surgical aftercare following surgery on the sense organs: Secondary | ICD-10-CM | POA: Diagnosis not present

## 2022-06-28 DIAGNOSIS — E11311 Type 2 diabetes mellitus with unspecified diabetic retinopathy with macular edema: Secondary | ICD-10-CM | POA: Diagnosis not present

## 2022-07-16 DIAGNOSIS — N312 Flaccid neuropathic bladder, not elsewhere classified: Secondary | ICD-10-CM | POA: Diagnosis not present

## 2022-07-17 DIAGNOSIS — L89314 Pressure ulcer of right buttock, stage 4: Secondary | ICD-10-CM | POA: Diagnosis not present

## 2022-07-17 DIAGNOSIS — L89153 Pressure ulcer of sacral region, stage 3: Secondary | ICD-10-CM | POA: Diagnosis not present

## 2022-07-17 DIAGNOSIS — E46 Unspecified protein-calorie malnutrition: Secondary | ICD-10-CM | POA: Diagnosis not present

## 2022-07-17 DIAGNOSIS — L89313 Pressure ulcer of right buttock, stage 3: Secondary | ICD-10-CM | POA: Diagnosis not present

## 2022-07-17 DIAGNOSIS — Z6825 Body mass index (BMI) 25.0-25.9, adult: Secondary | ICD-10-CM | POA: Diagnosis not present

## 2022-07-22 DIAGNOSIS — E113293 Type 2 diabetes mellitus with mild nonproliferative diabetic retinopathy without macular edema, bilateral: Secondary | ICD-10-CM | POA: Diagnosis not present

## 2022-07-22 DIAGNOSIS — Z794 Long term (current) use of insulin: Secondary | ICD-10-CM | POA: Diagnosis not present

## 2022-07-22 DIAGNOSIS — H5213 Myopia, bilateral: Secondary | ICD-10-CM | POA: Diagnosis not present

## 2022-07-22 DIAGNOSIS — H35033 Hypertensive retinopathy, bilateral: Secondary | ICD-10-CM | POA: Diagnosis not present

## 2022-07-22 DIAGNOSIS — I10 Essential (primary) hypertension: Secondary | ICD-10-CM | POA: Diagnosis not present

## 2022-07-22 DIAGNOSIS — H52203 Unspecified astigmatism, bilateral: Secondary | ICD-10-CM | POA: Diagnosis not present

## 2022-07-22 DIAGNOSIS — H40003 Preglaucoma, unspecified, bilateral: Secondary | ICD-10-CM | POA: Diagnosis not present

## 2022-07-22 DIAGNOSIS — Z48810 Encounter for surgical aftercare following surgery on the sense organs: Secondary | ICD-10-CM | POA: Diagnosis not present

## 2022-07-22 LAB — HM DIABETES EYE EXAM

## 2022-07-24 ENCOUNTER — Other Ambulatory Visit: Payer: Self-pay | Admitting: Student

## 2022-07-24 DIAGNOSIS — I10 Essential (primary) hypertension: Secondary | ICD-10-CM

## 2022-08-01 ENCOUNTER — Other Ambulatory Visit: Payer: Self-pay | Admitting: *Deleted

## 2022-08-01 NOTE — Patient Outreach (Signed)
  Care Coordination   08/01/2022 Name: Shawn [name]. MRN: [id number] DOB: [birth year]   Care Coordination Outreach Attempts:  An unsuccessful telephone outreach was attempted today to offer the patient information about available care coordination services as a benefit of their health plan.   Follow Up Plan:  Additional outreach attempts will be made to offer the patient care coordination information and services.   Encounter Outcome:  No Answer  Care Coordination Interventions Activated:  No   Care Coordination Interventions:  No, not indicated    SIG Tandra Rosado C. Myrtie Neither, MSN, [facility] Gerontological Nurse Practitioner [facility] Care Management [phone]

## 2022-08-13 DIAGNOSIS — N312 Flaccid neuropathic bladder, not elsewhere classified: Secondary | ICD-10-CM | POA: Diagnosis not present

## 2022-08-16 ENCOUNTER — Encounter: Payer: Self-pay | Admitting: *Deleted

## 2022-08-16 ENCOUNTER — Telehealth: Payer: Self-pay | Admitting: *Deleted

## 2022-08-16 NOTE — Patient Outreach (Signed)
  Care Coordination   Initial Visit Note   08/16/2022 Name: Shawn [name]. MRN: [id number] DOB: Mar 06, 1951  Shawn [name]. is a 71 y.o. year old male who sees Riesa Pope, MD for primary care. I spoke with  Shawn [name]. by phone today.  What matters to the patients health and wellness today?  "To maintain"    Goals Addressed               This Visit's Progress     COMPLETED: "to maintain" (pt-stated)        Care Coordination Interventions: Patient interviewed about adult health maintenance status including  importance of yearly Annual Wellness Visit, pt states he is to see primary care provider on 08/26/22 and will discuss. Provided education about taking all medications as prescribed, checking blood sugar as prescribed. Patient reports he checks CBG daily with today's reading 110 Spouse transports patient to all appointments [facility] care coordination program explained, patient and spouse appreciative and agreeable to today's outreach but decline any further follow up.         SDOH assessments and interventions completed:  Yes  SDOH Interventions Today    Flowsheet Row Most Recent Value  SDOH Interventions   Food Insecurity Interventions Intervention Not Indicated  Housing Interventions Intervention Not Indicated  Transportation Interventions Intervention Not Indicated        Care Coordination Interventions Activated:  Yes  Care Coordination Interventions:  Yes, provided   Follow up plan: No further intervention required.   Encounter Outcome:  Pt. Visit Completed   Jacqlyn Larsen [facility], BSN [facility] RN Care Coordinator [phone]

## 2022-08-21 DIAGNOSIS — K219 Gastro-esophageal reflux disease without esophagitis: Secondary | ICD-10-CM | POA: Diagnosis not present

## 2022-08-21 DIAGNOSIS — F17200 Nicotine dependence, unspecified, uncomplicated: Secondary | ICD-10-CM | POA: Diagnosis not present

## 2022-08-21 DIAGNOSIS — Z89611 Acquired absence of right leg above knee: Secondary | ICD-10-CM | POA: Diagnosis not present

## 2022-08-21 DIAGNOSIS — I10 Essential (primary) hypertension: Secondary | ICD-10-CM | POA: Diagnosis not present

## 2022-08-21 DIAGNOSIS — L98492 Non-pressure chronic ulcer of skin of other sites with fat layer exposed: Secondary | ICD-10-CM | POA: Diagnosis not present

## 2022-08-21 DIAGNOSIS — G8220 Paraplegia, unspecified: Secondary | ICD-10-CM | POA: Diagnosis not present

## 2022-08-21 DIAGNOSIS — L89314 Pressure ulcer of right buttock, stage 4: Secondary | ICD-10-CM | POA: Diagnosis not present

## 2022-08-21 DIAGNOSIS — I739 Peripheral vascular disease, unspecified: Secondary | ICD-10-CM | POA: Diagnosis not present

## 2022-08-26 ENCOUNTER — Other Ambulatory Visit: Payer: Self-pay

## 2022-08-26 ENCOUNTER — Ambulatory Visit (INDEPENDENT_AMBULATORY_CARE_PROVIDER_SITE_OTHER): Payer: Medicare Other

## 2022-08-26 VITALS — BP 155/84 | HR 68 | Temp 98.4°F | Resp 32 | Ht 73.0 in | Wt 180.0 lb

## 2022-08-26 DIAGNOSIS — E0800 Diabetes mellitus due to underlying condition with hyperosmolarity without nonketotic hyperglycemic-hyperosmolar coma (NKHHC): Secondary | ICD-10-CM

## 2022-08-26 DIAGNOSIS — I10 Essential (primary) hypertension: Secondary | ICD-10-CM

## 2022-08-26 DIAGNOSIS — Z89611 Acquired absence of right leg above knee: Secondary | ICD-10-CM

## 2022-08-26 DIAGNOSIS — E119 Type 2 diabetes mellitus without complications: Secondary | ICD-10-CM | POA: Diagnosis not present

## 2022-08-26 DIAGNOSIS — E785 Hyperlipidemia, unspecified: Secondary | ICD-10-CM

## 2022-08-26 DIAGNOSIS — Z23 Encounter for immunization: Secondary | ICD-10-CM

## 2022-08-26 DIAGNOSIS — Z87891 Personal history of nicotine dependence: Secondary | ICD-10-CM

## 2022-08-26 DIAGNOSIS — Z7984 Long term (current) use of oral hypoglycemic drugs: Secondary | ICD-10-CM | POA: Diagnosis not present

## 2022-08-26 LAB — POCT GLYCOSYLATED HEMOGLOBIN (HGB A1C): Hemoglobin A1C: 6.5 % — AB (ref 4.0–5.6)

## 2022-08-26 LAB — GLUCOSE, CAPILLARY: Glucose-Capillary: 120 mg/dL — ABNORMAL HIGH (ref 70–99)
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
## 2022-08-26 NOTE — Assessment & Plan Note (Addendum)
Patient presents for follow-up of his hypertension.  His blood pressure was 158/94 and then 155/84 at recheck today.  This is largely unchanged from last visit and he has since increase losartan from 50 mg to 100 mg daily.  He is also taking amlodipine 10 mg daily.  He denies chest pains, vision changes, or headaches.  He endorses much lower blood pressures at home but states his wife is the one who takes his pressure -she is not here today.  -Continue amlodipine 10 mg and losartan 100 mg daily -Instructed to record his blood pressures at home and bring to 1 month follow-up -Consider transitioning to olmesartan-HCTZ combo at next follow-up -BMP

## 2022-08-26 NOTE — Patient Instructions (Signed)
Mr.Ebbie L Zachery Dauer., it was a pleasure seeing you today!  Today we discussed: Blood pressure: Keep taking your medications as prescribed and bring your blood pressure log with you at your follow up in one month.  I have ordered the following labs today:  Lab Orders         Glucose, capillary         BMP8+Anion Gap         Lipid Profile         Microalbumin / Creatinine Urine Ratio         POC Hbg A1C       Tests ordered today:  none  Referrals ordered today:   Referral Orders  No referral(s) requested today     I have ordered the following medication/changed the following medications:   Stop the following medications: There are no discontinued medications.   Start the following medications: No orders of the defined types were placed in this encounter.    Follow-up:  4 weeks    Please make sure to arrive 15 minutes prior to your next appointment. If you arrive late, you may be asked to reschedule.   We look forward to seeing you next time. Please call our clinic at [phone] if you have any questions or concerns. The best time to call is Monday-Friday from 9am-4pm, but there is someone available 24/7. If after hours or the weekend, call the main hospital number and ask for the Internal Medicine Resident On-Call. If you need medication refills, please notify your pharmacy one week in advance and they will send [place] a request.  Thank you for letting us take part in your care. Wishing you the best!  Thank you, Linward Natal, MD

## 2022-08-26 NOTE — Assessment & Plan Note (Addendum)
Patient has a history of hyperlipidemia.  Currently on Zetia 10 mg daily.  I previously tried atorvastatin but discontinued due to diarrhea.  He is overdue for annual lipid profile.  -Repeat lipid panel -Amenable to trying Crestor if additional agent required for LDL control in the future  Addendum: LDL>70 with diabetes. Start Crestor 10 mg daily. Patient aware of new prescription.

## 2022-08-26 NOTE — Assessment & Plan Note (Addendum)
Patient presents for follow-up of his type 2 diabetes.  His A1c was 6.1 in January according to care everywhere.  Today his repeat A1c is 6.5.  He states his fasting glucose ranges from 115-120 at home.  He continues on metformin 500 mg daily.  He denies any hypoglycemic episodes.  Completed foot exam today.  -Continue metformin 500 mg daily -Microalbumin/Cr ratio  Addendum: Given persistently elevated microalbumin/cr ratio, will switch metformin 500 to Synjardy 5-500 daily.

## 2022-08-27 LAB — BMP8+ANION GAP
Anion Gap: 17 mmol/L (ref 10.0–18.0)
BUN/Creatinine Ratio: 17 (ref 10–24)
BUN: 15 mg/dL (ref 8–27)
CO2: 20 mmol/L (ref 20–29)
Calcium: 8.9 mg/dL (ref 8.6–10.2)
Chloride: 100 mmol/L (ref 96–106)
Creatinine, Ser: 0.88 mg/dL (ref 0.76–1.27)
Glucose: 114 mg/dL — ABNORMAL HIGH (ref 70–99)
Potassium: 4.2 mmol/L (ref 3.5–5.2)
Sodium: 137 mmol/L (ref 134–144)
eGFR: 91 mL/min/{1.73_m2} (ref >59)

## 2022-08-27 LAB — LIPID PANEL
Chol/HDL Ratio: 5 ratio (ref 0.0–5.0)
Cholesterol, Total: 140 mg/dL (ref 100–199)
HDL: 28 mg/dL — ABNORMAL LOW (ref >39)
LDL Chol Calc (NIH): 82 mg/dL (ref 0–99)
Triglycerides: 174 mg/dL — ABNORMAL HIGH (ref 0–149)
VLDL Cholesterol Cal: 30 mg/dL (ref 5–40)

## 2022-08-27 LAB — MICROALBUMIN / CREATININE URINE RATIO
Creatinine, Urine: 75.6 mg/dL
Microalb/Creat Ratio: 644 mg/g creat — ABNORMAL HIGH (ref 0–29)
Microalbumin, Urine: 486.6 ug/mL

## 2022-08-28 ENCOUNTER — Other Ambulatory Visit: Payer: Self-pay

## 2022-08-28 MED ORDER — ROSUVASTATIN CALCIUM 10 MG PO TABS: 10.0000 mg | ORAL_TABLET | Freq: Every day | ORAL | 11 refills | Status: DC

## 2022-08-28 MED ORDER — SYNJARDY 5-500 MG PO TABS: 1.0000 | ORAL_TABLET | Freq: Every day | ORAL | 1 refills | Status: DC

## 2022-08-28 NOTE — Addendum Note (Signed)
Addended by: Linward Natal on: 08/28/2022 09:09 AM   Modules accepted: Orders

## 2022-08-28 NOTE — Progress Notes (Signed)
Internal Medicine Clinic Attending   I saw and evaluated the patient.  I personally confirmed the key portions of the history and exam documented by Dr. White and I reviewed pertinent patient test results.  The assessment, diagnosis, and plan were formulated together and I agree with the documentation in the resident's note.  

## 2022-09-10 DIAGNOSIS — N312 Flaccid neuropathic bladder, not elsewhere classified: Secondary | ICD-10-CM | POA: Diagnosis not present

## 2022-09-18 DIAGNOSIS — L89894 Pressure ulcer of other site, stage 4: Secondary | ICD-10-CM | POA: Diagnosis not present

## 2022-09-18 DIAGNOSIS — L89313 Pressure ulcer of right buttock, stage 3: Secondary | ICD-10-CM | POA: Diagnosis not present

## 2022-09-18 DIAGNOSIS — L89314 Pressure ulcer of right buttock, stage 4: Secondary | ICD-10-CM | POA: Diagnosis not present

## 2022-09-18 DIAGNOSIS — E46 Unspecified protein-calorie malnutrition: Secondary | ICD-10-CM | POA: Diagnosis not present

## 2022-09-18 DIAGNOSIS — M869 Osteomyelitis, unspecified: Secondary | ICD-10-CM | POA: Diagnosis not present

## 2022-09-18 DIAGNOSIS — E1169 Type 2 diabetes mellitus with other specified complication: Secondary | ICD-10-CM | POA: Diagnosis not present

## 2022-09-18 DIAGNOSIS — L89153 Pressure ulcer of sacral region, stage 3: Secondary | ICD-10-CM | POA: Diagnosis not present

## 2022-09-25 ENCOUNTER — Encounter: Payer: Medicare Other | Admitting: Student

## 2022-09-28 ENCOUNTER — Other Ambulatory Visit: Payer: Self-pay | Admitting: Internal Medicine

## 2022-09-28 DIAGNOSIS — E43 Unspecified severe protein-calorie malnutrition: Secondary | ICD-10-CM

## 2022-10-01 ENCOUNTER — Encounter: Payer: Self-pay | Admitting: Student

## 2022-10-01 ENCOUNTER — Ambulatory Visit (INDEPENDENT_AMBULATORY_CARE_PROVIDER_SITE_OTHER): Payer: Medicare Other | Admitting: Student

## 2022-10-01 ENCOUNTER — Ambulatory Visit (INDEPENDENT_AMBULATORY_CARE_PROVIDER_SITE_OTHER): Payer: Medicare Other

## 2022-10-01 VITALS — BP 114/79 | HR 79 | Temp 98.2°F | Ht 73.0 in | Wt 175.0 lb

## 2022-10-01 VITALS — BP 114/79 | HR 79 | Ht 73.0 in | Wt 175.0 lb

## 2022-10-01 DIAGNOSIS — Z0000 Encounter for general adult medical examination without abnormal findings: Secondary | ICD-10-CM

## 2022-10-01 DIAGNOSIS — I10 Essential (primary) hypertension: Secondary | ICD-10-CM

## 2022-10-01 DIAGNOSIS — L89310 Pressure ulcer of right buttock, unstageable: Secondary | ICD-10-CM | POA: Diagnosis not present

## 2022-10-01 DIAGNOSIS — E119 Type 2 diabetes mellitus without complications: Secondary | ICD-10-CM | POA: Diagnosis not present

## 2022-10-01 DIAGNOSIS — Z23 Encounter for immunization: Secondary | ICD-10-CM

## 2022-10-01 DIAGNOSIS — E43 Unspecified severe protein-calorie malnutrition: Secondary | ICD-10-CM | POA: Diagnosis not present

## 2022-10-01 DIAGNOSIS — Z87891 Personal history of nicotine dependence: Secondary | ICD-10-CM | POA: Diagnosis not present

## 2022-10-01 DIAGNOSIS — E785 Hyperlipidemia, unspecified: Secondary | ICD-10-CM

## 2022-10-01 DIAGNOSIS — E0800 Diabetes mellitus due to underlying condition with hyperosmolarity without nonketotic hyperglycemic-hyperosmolar coma (NKHHC): Secondary | ICD-10-CM

## 2022-10-01 MED ORDER — MIRTAZAPINE 7.5 MG PO TABS: 7.5000 mg | ORAL_TABLET | Freq: Every evening | ORAL | 2 refills | Status: DC | PRN

## 2022-10-01 NOTE — Addendum Note (Signed)
Addended by: Kerin Perna on: 10/01/2022 03:18 PM   Modules accepted: Orders
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
## 2022-10-01 NOTE — Patient Instructions (Signed)
Mr. Arps,  It was a pleasure seeing you in the clinic today.  Here is a summary what we talked about:  1.  Your blood pressure is very well controlled.  Please continue amlodipine and losartan.  2.  Your diabetes is also well controlled.  Your A1c was 6.5 in September.  Please continue Synjardy.  3.  Please continue Crestor and Zetia.  Your cholesterol is well controlled.  4.  Please continue to follow-up with the wound center and surgery team.  5.  You received a Tdap vaccine today.  Please return in 6 months  Take care,  Dr. Alfonse Spruce

## 2022-10-01 NOTE — Assessment & Plan Note (Addendum)
Diabetes well controlled.  A1c 6.5 in September.  He was started on Synjardy 5-500 mg daily for microalbuminuria. Foot exam performed today - unremarkable  -Continue Synjardy

## 2022-10-01 NOTE — Assessment & Plan Note (Signed)
Blood pressure well controlled 114/79.  He reports adherence to amlodipine 10 mg and losartan 100 mg.  No issue obtaining this medication.  CMP done this month was unremarkable.  -Continue current regimen
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
## 2022-10-01 NOTE — Assessment & Plan Note (Addendum)
Patient following the [facility] wound care center for his stage IV right ischial decubitus ulcer.  Last visit was 09/18/2022.  Per notes, looks like his wound is healing appropriately.  We will optimize his secondary risk factors to promote wound healing including strict glycemic control.  Patient will follow-up with orthopedic team for possible flap surgery at the end of this year.  Fortunately he has good social support at home.  He lives with his wife who provides a lot of assistance.  He also has 4 grandchildren who provide support.  Reports good nutrition at home.

## 2022-10-01 NOTE — Assessment & Plan Note (Signed)
Tdap vaccine given today.  

## 2022-10-01 NOTE — Assessment & Plan Note (Addendum)
LDL at goal 82 (08/2022).  -Crestor 10 mg and Zetia 10 mg

## 2022-10-03 NOTE — Progress Notes (Signed)
Internal Medicine Clinic Attending  Case discussed with Dr. Nguyen  At the time of the visit.  We reviewed the resident's history and exam and pertinent patient test results.  I agree with the assessment, diagnosis, and plan of care documented in the resident's note. 

## 2022-10-08 DIAGNOSIS — N312 Flaccid neuropathic bladder, not elsewhere classified: Secondary | ICD-10-CM | POA: Diagnosis not present

## 2022-10-16 DIAGNOSIS — L89154 Pressure ulcer of sacral region, stage 4: Secondary | ICD-10-CM | POA: Diagnosis not present

## 2022-10-16 DIAGNOSIS — L89153 Pressure ulcer of sacral region, stage 3: Secondary | ICD-10-CM | POA: Diagnosis not present

## 2022-10-16 DIAGNOSIS — E46 Unspecified protein-calorie malnutrition: Secondary | ICD-10-CM | POA: Diagnosis not present

## 2022-10-16 DIAGNOSIS — L89313 Pressure ulcer of right buttock, stage 3: Secondary | ICD-10-CM | POA: Diagnosis not present

## 2022-10-16 DIAGNOSIS — L89314 Pressure ulcer of right buttock, stage 4: Secondary | ICD-10-CM | POA: Diagnosis not present

## 2022-10-21 ENCOUNTER — Other Ambulatory Visit: Payer: Self-pay | Admitting: Student

## 2022-10-21 DIAGNOSIS — E785 Hyperlipidemia, unspecified: Secondary | ICD-10-CM

## 2022-10-23 DIAGNOSIS — H43813 Vitreous degeneration, bilateral: Secondary | ICD-10-CM | POA: Diagnosis not present

## 2022-10-23 DIAGNOSIS — Z7984 Long term (current) use of oral hypoglycemic drugs: Secondary | ICD-10-CM | POA: Diagnosis not present

## 2022-10-23 DIAGNOSIS — E1151 Type 2 diabetes mellitus with diabetic peripheral angiopathy without gangrene: Secondary | ICD-10-CM | POA: Diagnosis not present

## 2022-10-23 DIAGNOSIS — H35033 Hypertensive retinopathy, bilateral: Secondary | ICD-10-CM | POA: Diagnosis not present

## 2022-10-23 DIAGNOSIS — Z794 Long term (current) use of insulin: Secondary | ICD-10-CM | POA: Diagnosis not present

## 2022-10-23 DIAGNOSIS — H40003 Preglaucoma, unspecified, bilateral: Secondary | ICD-10-CM | POA: Diagnosis not present

## 2022-10-23 DIAGNOSIS — Z87891 Personal history of nicotine dependence: Secondary | ICD-10-CM | POA: Diagnosis not present

## 2022-10-23 DIAGNOSIS — E113293 Type 2 diabetes mellitus with mild nonproliferative diabetic retinopathy without macular edema, bilateral: Secondary | ICD-10-CM | POA: Diagnosis not present

## 2022-10-23 DIAGNOSIS — E083293 Diabetes mellitus due to underlying condition with mild nonproliferative diabetic retinopathy without macular edema, bilateral: Secondary | ICD-10-CM | POA: Diagnosis not present

## 2022-10-23 DIAGNOSIS — I10 Essential (primary) hypertension: Secondary | ICD-10-CM | POA: Diagnosis not present

## 2022-10-23 DIAGNOSIS — Z961 Presence of intraocular lens: Secondary | ICD-10-CM | POA: Diagnosis not present

## 2022-10-30 NOTE — Progress Notes (Signed)
Internal Medicine Clinic Attending  Case and documentation of Dr. Nguyen  reviewed.  I reviewed the AWV findings.  I agree with the assessment, diagnosis, and plan of care documented in the AWV note.     

## 2022-11-10 ENCOUNTER — Other Ambulatory Visit: Payer: Self-pay | Admitting: Student

## 2022-11-10 DIAGNOSIS — G546 Phantom limb syndrome with pain: Secondary | ICD-10-CM

## 2022-11-10 DIAGNOSIS — Z89611 Acquired absence of right leg above knee: Secondary | ICD-10-CM

## 2022-11-13 DIAGNOSIS — Z87891 Personal history of nicotine dependence: Secondary | ICD-10-CM | POA: Diagnosis not present

## 2022-11-13 DIAGNOSIS — L89314 Pressure ulcer of right buttock, stage 4: Secondary | ICD-10-CM | POA: Diagnosis not present

## 2022-11-13 DIAGNOSIS — L89313 Pressure ulcer of right buttock, stage 3: Secondary | ICD-10-CM | POA: Diagnosis not present

## 2022-11-13 DIAGNOSIS — L89153 Pressure ulcer of sacral region, stage 3: Secondary | ICD-10-CM | POA: Diagnosis not present

## 2022-11-13 DIAGNOSIS — E46 Unspecified protein-calorie malnutrition: Secondary | ICD-10-CM | POA: Diagnosis not present

## 2022-11-13 DIAGNOSIS — L89154 Pressure ulcer of sacral region, stage 4: Secondary | ICD-10-CM | POA: Diagnosis not present

## 2022-11-19 DIAGNOSIS — N312 Flaccid neuropathic bladder, not elsewhere classified: Secondary | ICD-10-CM | POA: Diagnosis not present

## 2022-12-18 DIAGNOSIS — L89153 Pressure ulcer of sacral region, stage 3: Secondary | ICD-10-CM | POA: Diagnosis not present

## 2022-12-18 DIAGNOSIS — L89313 Pressure ulcer of right buttock, stage 3: Secondary | ICD-10-CM | POA: Diagnosis not present

## 2022-12-18 DIAGNOSIS — E46 Unspecified protein-calorie malnutrition: Secondary | ICD-10-CM | POA: Diagnosis not present

## 2022-12-18 DIAGNOSIS — L89154 Pressure ulcer of sacral region, stage 4: Secondary | ICD-10-CM | POA: Diagnosis not present

## 2022-12-18 DIAGNOSIS — L89314 Pressure ulcer of right buttock, stage 4: Secondary | ICD-10-CM | POA: Diagnosis not present

## 2022-12-23 DIAGNOSIS — N312 Flaccid neuropathic bladder, not elsewhere classified: Secondary | ICD-10-CM | POA: Diagnosis not present

## 2022-12-25 ENCOUNTER — Other Ambulatory Visit: Payer: Self-pay

## 2023-01-21 DIAGNOSIS — N312 Flaccid neuropathic bladder, not elsewhere classified: Secondary | ICD-10-CM | POA: Diagnosis not present

## 2023-01-22 DIAGNOSIS — M8580 Other specified disorders of bone density and structure, unspecified site: Secondary | ICD-10-CM | POA: Diagnosis not present

## 2023-01-22 DIAGNOSIS — L89313 Pressure ulcer of right buttock, stage 3: Secondary | ICD-10-CM | POA: Diagnosis not present

## 2023-01-22 DIAGNOSIS — L89153 Pressure ulcer of sacral region, stage 3: Secondary | ICD-10-CM | POA: Diagnosis not present

## 2023-01-22 DIAGNOSIS — E46 Unspecified protein-calorie malnutrition: Secondary | ICD-10-CM | POA: Diagnosis not present

## 2023-01-22 DIAGNOSIS — Z89611 Acquired absence of right leg above knee: Secondary | ICD-10-CM | POA: Diagnosis not present

## 2023-01-22 DIAGNOSIS — L89314 Pressure ulcer of right buttock, stage 4: Secondary | ICD-10-CM | POA: Diagnosis not present

## 2023-01-22 DIAGNOSIS — M533 Sacrococcygeal disorders, not elsewhere classified: Secondary | ICD-10-CM | POA: Diagnosis not present

## 2023-01-22 DIAGNOSIS — E1149 Type 2 diabetes mellitus with other diabetic neurological complication: Secondary | ICD-10-CM | POA: Diagnosis not present

## 2023-02-18 DIAGNOSIS — N312 Flaccid neuropathic bladder, not elsewhere classified: Secondary | ICD-10-CM | POA: Diagnosis not present

## 2023-02-19 DIAGNOSIS — H35033 Hypertensive retinopathy, bilateral: Secondary | ICD-10-CM | POA: Diagnosis not present

## 2023-02-19 DIAGNOSIS — Z89611 Acquired absence of right leg above knee: Secondary | ICD-10-CM | POA: Diagnosis not present

## 2023-02-19 DIAGNOSIS — H43813 Vitreous degeneration, bilateral: Secondary | ICD-10-CM | POA: Diagnosis not present

## 2023-02-19 DIAGNOSIS — E113293 Type 2 diabetes mellitus with mild nonproliferative diabetic retinopathy without macular edema, bilateral: Secondary | ICD-10-CM | POA: Diagnosis not present

## 2023-02-19 DIAGNOSIS — Z961 Presence of intraocular lens: Secondary | ICD-10-CM | POA: Diagnosis not present

## 2023-02-19 DIAGNOSIS — H5213 Myopia, bilateral: Secondary | ICD-10-CM | POA: Diagnosis not present

## 2023-02-19 DIAGNOSIS — E083293 Diabetes mellitus due to underlying condition with mild nonproliferative diabetic retinopathy without macular edema, bilateral: Secondary | ICD-10-CM | POA: Diagnosis not present

## 2023-02-19 DIAGNOSIS — Z7984 Long term (current) use of oral hypoglycemic drugs: Secondary | ICD-10-CM | POA: Diagnosis not present

## 2023-02-19 DIAGNOSIS — H401132 Primary open-angle glaucoma, bilateral, moderate stage: Secondary | ICD-10-CM | POA: Diagnosis not present

## 2023-02-19 DIAGNOSIS — Z794 Long term (current) use of insulin: Secondary | ICD-10-CM | POA: Diagnosis not present

## 2023-02-19 DIAGNOSIS — H52203 Unspecified astigmatism, bilateral: Secondary | ICD-10-CM | POA: Diagnosis not present

## 2023-02-19 LAB — HM DIABETES EYE EXAM

## 2023-03-05 DIAGNOSIS — I96 Gangrene, not elsewhere classified: Secondary | ICD-10-CM | POA: Diagnosis not present

## 2023-03-05 DIAGNOSIS — Z89611 Acquired absence of right leg above knee: Secondary | ICD-10-CM | POA: Diagnosis not present

## 2023-03-05 DIAGNOSIS — L89314 Pressure ulcer of right buttock, stage 4: Secondary | ICD-10-CM | POA: Diagnosis not present

## 2023-03-05 DIAGNOSIS — G8220 Paraplegia, unspecified: Secondary | ICD-10-CM | POA: Diagnosis not present

## 2023-03-05 DIAGNOSIS — Z87891 Personal history of nicotine dependence: Secondary | ICD-10-CM | POA: Diagnosis not present

## 2023-03-05 DIAGNOSIS — M533 Sacrococcygeal disorders, not elsewhere classified: Secondary | ICD-10-CM | POA: Diagnosis not present

## 2023-03-13 ENCOUNTER — Ambulatory Visit (INDEPENDENT_AMBULATORY_CARE_PROVIDER_SITE_OTHER): Payer: Medicare Other | Admitting: Student

## 2023-03-13 VITALS — BP 130/77 | HR 88 | Temp 98.4°F

## 2023-03-13 DIAGNOSIS — Z0000 Encounter for general adult medical examination without abnormal findings: Secondary | ICD-10-CM

## 2023-03-13 DIAGNOSIS — H269 Unspecified cataract: Secondary | ICD-10-CM

## 2023-03-13 DIAGNOSIS — D509 Iron deficiency anemia, unspecified: Secondary | ICD-10-CM

## 2023-03-13 DIAGNOSIS — E0800 Diabetes mellitus due to underlying condition with hyperosmolarity without nonketotic hyperglycemic-hyperosmolar coma (NKHHC): Secondary | ICD-10-CM | POA: Diagnosis not present

## 2023-03-13 DIAGNOSIS — I10 Essential (primary) hypertension: Secondary | ICD-10-CM

## 2023-03-13 DIAGNOSIS — E785 Hyperlipidemia, unspecified: Secondary | ICD-10-CM | POA: Diagnosis not present

## 2023-03-13 DIAGNOSIS — L89310 Pressure ulcer of right buttock, unstageable: Secondary | ICD-10-CM

## 2023-03-13 DIAGNOSIS — E43 Unspecified severe protein-calorie malnutrition: Secondary | ICD-10-CM

## 2023-03-13 DIAGNOSIS — R0989 Other specified symptoms and signs involving the circulatory and respiratory systems: Secondary | ICD-10-CM | POA: Insufficient documentation

## 2023-03-13 HISTORY — DX: Other specified symptoms and signs involving the circulatory and respiratory systems: R09.89

## 2023-03-13 LAB — POCT GLYCOSYLATED HEMOGLOBIN (HGB A1C): Hemoglobin A1C: 7 % — AB (ref 4.0–5.6)

## 2023-03-13 LAB — GLUCOSE, CAPILLARY: Glucose-Capillary: 124 mg/dL — ABNORMAL HIGH (ref 70–99)

## 2023-03-13 MED ORDER — ROSUVASTATIN CALCIUM 10 MG PO TABS
10.0000 mg | ORAL_TABLET | Freq: Every day | ORAL | 3 refills | Status: DC
Start: 2023-03-13 — End: 2023-08-13

## 2023-03-13 MED ORDER — SYNJARDY 5-500 MG PO TABS
1.0000 | ORAL_TABLET | Freq: Every day | ORAL | 1 refills | Status: DC
Start: 2023-03-13 — End: 2023-08-13

## 2023-03-13 MED ORDER — FERROUS SULFATE 325 (65 FE) MG PO TABS: 325.0000 mg | ORAL_TABLET | ORAL | 3 refills | Status: DC

## 2023-03-13 MED ORDER — OMEPRAZOLE 40 MG PO CPDR: 40.0000 mg | DELAYED_RELEASE_CAPSULE | Freq: Every day | ORAL | 3 refills | Status: DC

## 2023-03-13 MED ORDER — LOSARTAN POTASSIUM 100 MG PO TABS: 100.0000 mg | ORAL_TABLET | Freq: Every day | ORAL | 3 refills | Status: DC

## 2023-03-13 MED ORDER — EZETIMIBE 10 MG PO TABS: 10.0000 mg | ORAL_TABLET | Freq: Every day | ORAL | 2 refills | Status: DC

## 2023-03-13 MED ORDER — AMLODIPINE BESYLATE 10 MG PO TABS: 10.0000 mg | ORAL_TABLET | Freq: Every day | ORAL | 2 refills | Status: DC

## 2023-03-13 MED ORDER — ZOSTER VAC RECOMB ADJUVANTED 50 MCG/0.5ML IM SUSR: 0.5000 mL | Freq: Once | INTRAMUSCULAR | 0 refills | Status: AC

## 2023-03-13 NOTE — Assessment & Plan Note (Signed)
Current regimen of synjardy 5-500 daily. A1c increased from 6.5% to 7.0%. Goal A1c of under 7.0%. We talked about increase his diet and trying to do exercises while in the wheelchair. He will return in 3 months for repeat A1c and if still elevated can increase synjardy. Following with his eye doctor regularly, recently had cataract surgery. Foot exam without wounds

## 2023-03-13 NOTE — Assessment & Plan Note (Signed)
Recent cataract surgery and has noticed significant improvement in his vision since.

## 2023-03-13 NOTE — Assessment & Plan Note (Signed)
Prescription given for shingles vaccine

## 2023-03-13 NOTE — Patient Instructions (Signed)
Thank you, Shawn [name]. for allowing [place] to provide your care today. Today we discussed .    Diabetes Please work on your diet and performing some exercises daily. We will see you back in 3 months to repeat your A1c  Please go to the pharmacy and get your shingles shot  I would like to order the official test to check your leg for blocked arteries. They will call to have you schedule this  We will check your iron levels today to see if you still need iron pills.   I have ordered the following labs for you:   Lab Orders         Iron, TIBC and Ferritin Panel         Glucose, capillary         POC Hbg A1C       Referrals ordered today:   Referral Orders  No referral(s) requested today     I have ordered the following medication/changed the following medications:   Stop the following medications: Medications Discontinued During This Encounter  Medication Reason   losartan (COZAAR) 100 MG tablet Reorder   omeprazole (PRILOSEC) 40 MG capsule Reorder   ferrous sulfate 325 (65 FE) MG tablet Reorder   amLODipine (NORVASC) 10 MG tablet Reorder   rosuvastatin (CRESTOR) 10 MG tablet Reorder   ezetimibe (ZETIA) 10 MG tablet Reorder   Empagliflozin-metFORMIN HCl (SYNJARDY) 5-500 MG TABS Reorder     Start the following medications: Meds ordered this encounter  Medications   ferrous sulfate 325 (65 FE) MG tablet    Sig: Take 1 tablet (325 mg total) by mouth every other day.    Dispense:  45 tablet    Refill:  3   rosuvastatin (CRESTOR) 10 MG tablet    Sig: Take 1 tablet (10 mg total) by mouth daily.    Dispense:  90 tablet    Refill:  3   amLODipine (NORVASC) 10 MG tablet    Sig: Take 1 tablet (10 mg total) by mouth daily.    Dispense:  90 tablet    Refill:  2   omeprazole (PRILOSEC) 40 MG capsule    Sig: Take 1 capsule (40 mg total) by mouth daily.    Dispense:  90 each    Refill:  3   ezetimibe (ZETIA) 10 MG tablet    Sig: Take 1 tablet (10 mg total) by mouth  daily.    Dispense:  90 tablet    Refill:  2   Empagliflozin-metFORMIN HCl (SYNJARDY) 5-500 MG TABS    Sig: Take 1 tablet by mouth daily.    Dispense:  90 tablet    Refill:  1   losartan (COZAAR) 100 MG tablet    Sig: Take 1 tablet (100 mg total) by mouth daily.    Dispense:  90 each    Refill:  3   Zoster Vaccine Adjuvanted [facility]) injection    Sig: Inject 0.5 mLs into the muscle once for 1 dose.    Dispense:  0.5 mL    Refill:  0     Follow up: 3 months A1c    Should you have any questions or concerns please call the internal medicine clinic at [phone].    Sanjuana Letters, D.O. [place]

## 2023-03-13 NOTE — Assessment & Plan Note (Signed)
BP well controlled at 130/77. Continue amlodipine 10 mg daily and losartan 100 mg daily
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
## 2023-03-13 NOTE — Assessment & Plan Note (Signed)
Has continued on ferrous sulfate every other day. Thought to be secondary to his chronic wounds. Will check iron levels and ferritin today. If normalized can discontinue iron supplementation

## 2023-03-13 NOTE — Assessment & Plan Note (Signed)
Continues to follow up with [facility] for his wound care. Him and his wife do an excellent job of managing the area, making sure it is clean and free of stool. His appointments are monthly.

## 2023-03-13 NOTE — Assessment & Plan Note (Addendum)
Difficulty palpating left DP pulse and PT pulse. Will obtain POC ABI's today. Unable to tell if claudication symptoms as patient with paraplegic spinal paralysis.   Addendum Abnormal POC ABI, will order official tests w and w/o TBI

## 2023-03-14 LAB — IRON,TIBC AND FERRITIN PANEL
Ferritin: 188 ng/mL (ref 30–400)
Iron Saturation: 16 % (ref 15–55)
Iron: 38 ug/dL (ref 38–169)
Total Iron Binding Capacity: 240 ug/dL — ABNORMAL LOW (ref 250–450)
UIBC: 202 ug/dL (ref 111–343)

## 2023-03-14 NOTE — Addendum Note (Signed)
Addended by: Dickie La on: 03/14/2023 10:58 AM   Modules accepted: Level of Service

## 2023-03-14 NOTE — Progress Notes (Signed)
Internal Medicine Clinic Attending  Case discussed with Dr. Katsadouros  At the time of the visit.  We reviewed the resident's history and exam and pertinent patient test results.  I agree with the assessment, diagnosis, and plan of care documented in the resident's note.  

## 2023-03-17 ENCOUNTER — Telehealth: Payer: Self-pay | Admitting: Student

## 2023-03-17 NOTE — Telephone Encounter (Signed)
Attempted to call Shawn [name] as well as his wife x 2 to discuss iron study labs. Will need to continue PO iron therapy. Refill sent in

## 2023-03-18 DIAGNOSIS — N312 Flaccid neuropathic bladder, not elsewhere classified: Secondary | ICD-10-CM | POA: Diagnosis not present

## 2023-03-22 ENCOUNTER — Other Ambulatory Visit: Payer: Self-pay | Admitting: Student

## 2023-03-22 DIAGNOSIS — Z89611 Acquired absence of right leg above knee: Secondary | ICD-10-CM

## 2023-03-22 DIAGNOSIS — G546 Phantom limb syndrome with pain: Secondary | ICD-10-CM

## 2023-03-24 DIAGNOSIS — Z993 Dependence on wheelchair: Secondary | ICD-10-CM | POA: Diagnosis not present

## 2023-03-24 DIAGNOSIS — Z4689 Encounter for fitting and adjustment of other specified devices: Secondary | ICD-10-CM | POA: Diagnosis not present

## 2023-03-24 DIAGNOSIS — G8220 Paraplegia, unspecified: Secondary | ICD-10-CM | POA: Diagnosis not present

## 2023-03-24 DIAGNOSIS — L89314 Pressure ulcer of right buttock, stage 4: Secondary | ICD-10-CM | POA: Diagnosis not present

## 2023-03-24 DIAGNOSIS — Z89611 Acquired absence of right leg above knee: Secondary | ICD-10-CM | POA: Diagnosis not present

## 2023-03-24 NOTE — Telephone Encounter (Signed)
Patient's wife called in for refill on gabapentin. Explained Rx was sent today at 1409. Discussed need to continue PO iron. States Provider already notified her to continue.

## 2023-03-25 ENCOUNTER — Encounter: Payer: Self-pay | Admitting: Dietician

## 2023-04-03 ENCOUNTER — Ambulatory Visit (HOSPITAL_COMMUNITY)
Admission: RE | Admit: 2023-04-03 | Discharge: 2023-04-03 | Disposition: A | Discharge status: Home / Self Care | Payer: Medicare Other | Source: Ambulatory Visit | Attending: Internal Medicine | Admitting: Internal Medicine

## 2023-04-03 DIAGNOSIS — R0989 Other specified symptoms and signs involving the circulatory and respiratory systems: Secondary | ICD-10-CM | POA: Insufficient documentation

## 2023-04-09 DIAGNOSIS — L89314 Pressure ulcer of right buttock, stage 4: Secondary | ICD-10-CM | POA: Diagnosis not present

## 2023-04-09 DIAGNOSIS — Z89611 Acquired absence of right leg above knee: Secondary | ICD-10-CM | POA: Diagnosis not present

## 2023-04-09 DIAGNOSIS — G8220 Paraplegia, unspecified: Secondary | ICD-10-CM | POA: Diagnosis not present

## 2023-04-09 DIAGNOSIS — Z993 Dependence on wheelchair: Secondary | ICD-10-CM | POA: Diagnosis not present

## 2023-04-09 DIAGNOSIS — Z4689 Encounter for fitting and adjustment of other specified devices: Secondary | ICD-10-CM | POA: Diagnosis not present

## 2023-04-10 ENCOUNTER — Other Ambulatory Visit: Payer: Self-pay | Admitting: Student

## 2023-04-10 DIAGNOSIS — I739 Peripheral vascular disease, unspecified: Secondary | ICD-10-CM

## 2023-04-15 DIAGNOSIS — N312 Flaccid neuropathic bladder, not elsewhere classified: Secondary | ICD-10-CM | POA: Diagnosis not present

## 2023-04-16 DIAGNOSIS — Z87891 Personal history of nicotine dependence: Secondary | ICD-10-CM | POA: Diagnosis not present

## 2023-04-16 DIAGNOSIS — I96 Gangrene, not elsewhere classified: Secondary | ICD-10-CM | POA: Diagnosis not present

## 2023-04-16 DIAGNOSIS — Z89611 Acquired absence of right leg above knee: Secondary | ICD-10-CM | POA: Diagnosis not present

## 2023-04-16 DIAGNOSIS — M533 Sacrococcygeal disorders, not elsewhere classified: Secondary | ICD-10-CM | POA: Diagnosis not present

## 2023-04-16 DIAGNOSIS — M869 Osteomyelitis, unspecified: Secondary | ICD-10-CM | POA: Diagnosis not present

## 2023-04-16 DIAGNOSIS — L89314 Pressure ulcer of right buttock, stage 4: Secondary | ICD-10-CM | POA: Diagnosis not present

## 2023-04-16 DIAGNOSIS — G8220 Paraplegia, unspecified: Secondary | ICD-10-CM | POA: Diagnosis not present

## 2023-05-13 ENCOUNTER — Encounter: Payer: Self-pay | Admitting: Student

## 2023-05-13 DIAGNOSIS — N312 Flaccid neuropathic bladder, not elsewhere classified: Secondary | ICD-10-CM | POA: Diagnosis not present

## 2023-05-21 ENCOUNTER — Other Ambulatory Visit: Payer: Self-pay | Admitting: Student

## 2023-05-28 ENCOUNTER — Encounter: Payer: Medicare Other | Admitting: Vascular Surgery

## 2023-05-28 DIAGNOSIS — L89154 Pressure ulcer of sacral region, stage 4: Secondary | ICD-10-CM | POA: Diagnosis not present

## 2023-05-28 DIAGNOSIS — L89314 Pressure ulcer of right buttock, stage 4: Secondary | ICD-10-CM | POA: Diagnosis not present

## 2023-05-28 DIAGNOSIS — F17200 Nicotine dependence, unspecified, uncomplicated: Secondary | ICD-10-CM | POA: Diagnosis not present

## 2023-06-04 ENCOUNTER — Encounter: Payer: Self-pay | Admitting: Vascular Surgery

## 2023-06-04 ENCOUNTER — Ambulatory Visit (INDEPENDENT_AMBULATORY_CARE_PROVIDER_SITE_OTHER): Payer: Medicare Other | Admitting: Vascular Surgery

## 2023-06-04 VITALS — BP 119/77 | HR 75 | Temp 97.5°F | Ht 73.5 in | Wt 205.0 lb

## 2023-06-04 DIAGNOSIS — I739 Peripheral vascular disease, unspecified: Secondary | ICD-10-CM
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
## 2023-06-08 ENCOUNTER — Other Ambulatory Visit: Payer: Self-pay | Admitting: Student

## 2023-06-08 DIAGNOSIS — E43 Unspecified severe protein-calorie malnutrition: Secondary | ICD-10-CM

## 2023-06-11 DIAGNOSIS — N312 Flaccid neuropathic bladder, not elsewhere classified: Secondary | ICD-10-CM | POA: Diagnosis not present

## 2023-07-09 DIAGNOSIS — L89314 Pressure ulcer of right buttock, stage 4: Secondary | ICD-10-CM | POA: Diagnosis not present

## 2023-07-09 DIAGNOSIS — Z87891 Personal history of nicotine dependence: Secondary | ICD-10-CM | POA: Diagnosis not present

## 2023-07-09 DIAGNOSIS — Z89611 Acquired absence of right leg above knee: Secondary | ICD-10-CM | POA: Diagnosis not present

## 2023-07-09 DIAGNOSIS — L89154 Pressure ulcer of sacral region, stage 4: Secondary | ICD-10-CM | POA: Diagnosis not present

## 2023-07-09 DIAGNOSIS — G8220 Paraplegia, unspecified: Secondary | ICD-10-CM | POA: Diagnosis not present

## 2023-07-15 DIAGNOSIS — N312 Flaccid neuropathic bladder, not elsewhere classified: Secondary | ICD-10-CM | POA: Diagnosis not present

## 2023-07-28 ENCOUNTER — Other Ambulatory Visit: Payer: Self-pay | Admitting: Student

## 2023-07-28 DIAGNOSIS — E43 Unspecified severe protein-calorie malnutrition: Secondary | ICD-10-CM

## 2023-08-01 MED ORDER — TRIAMCINOLONE ACETONIDE 0.1 % EX CREA: TOPICAL_CREAM | Freq: Three times a day (TID) | CUTANEOUS | 2 refills | Status: DC

## 2023-08-04 ENCOUNTER — Other Ambulatory Visit: Payer: Self-pay

## 2023-08-04 DIAGNOSIS — G546 Phantom limb syndrome with pain: Secondary | ICD-10-CM

## 2023-08-04 DIAGNOSIS — Z89611 Acquired absence of right leg above knee: Secondary | ICD-10-CM

## 2023-08-04 MED ORDER — GABAPENTIN 400 MG PO CAPS: 400.0000 mg | ORAL_CAPSULE | Freq: Two times a day (BID) | ORAL | 2 refills | Status: DC

## 2023-08-13 ENCOUNTER — Ambulatory Visit (INDEPENDENT_AMBULATORY_CARE_PROVIDER_SITE_OTHER): Payer: Medicare Other | Admitting: Student

## 2023-08-13 VITALS — BP 100/70 | HR 87 | Temp 98.2°F | Ht 73.0 in

## 2023-08-13 DIAGNOSIS — G546 Phantom limb syndrome with pain: Secondary | ICD-10-CM | POA: Diagnosis not present

## 2023-08-13 DIAGNOSIS — Z89611 Acquired absence of right leg above knee: Secondary | ICD-10-CM

## 2023-08-13 DIAGNOSIS — E785 Hyperlipidemia, unspecified: Secondary | ICD-10-CM | POA: Diagnosis not present

## 2023-08-13 DIAGNOSIS — E119 Type 2 diabetes mellitus without complications: Secondary | ICD-10-CM | POA: Diagnosis not present

## 2023-08-13 DIAGNOSIS — E0800 Diabetes mellitus due to underlying condition with hyperosmolarity without nonketotic hyperglycemic-hyperosmolar coma (NKHHC): Secondary | ICD-10-CM

## 2023-08-13 DIAGNOSIS — Z7984 Long term (current) use of oral hypoglycemic drugs: Secondary | ICD-10-CM

## 2023-08-13 DIAGNOSIS — D509 Iron deficiency anemia, unspecified: Secondary | ICD-10-CM

## 2023-08-13 DIAGNOSIS — I10 Essential (primary) hypertension: Secondary | ICD-10-CM

## 2023-08-13 DIAGNOSIS — Z23 Encounter for immunization: Secondary | ICD-10-CM | POA: Diagnosis not present

## 2023-08-13 LAB — POCT GLYCOSYLATED HEMOGLOBIN (HGB A1C): Hemoglobin A1C: 6.4 % — AB (ref 4.0–5.6)

## 2023-08-13 LAB — GLUCOSE, CAPILLARY: Glucose-Capillary: 105 mg/dL — ABNORMAL HIGH (ref 70–99)

## 2023-08-13 MED ORDER — EZETIMIBE 10 MG PO TABS
10.0000 mg | ORAL_TABLET | Freq: Every day | ORAL | 2 refills | Status: DC
Start: 2023-08-13 — End: 2023-08-15

## 2023-08-13 MED ORDER — GABAPENTIN 400 MG PO CAPS: 400.0000 mg | ORAL_CAPSULE | Freq: Two times a day (BID) | ORAL | 2 refills | Status: DC

## 2023-08-13 MED ORDER — LOSARTAN POTASSIUM 100 MG PO TABS: 100.0000 mg | ORAL_TABLET | Freq: Every day | ORAL | 3 refills | Status: DC

## 2023-08-13 MED ORDER — SYNJARDY 5-500 MG PO TABS
1.0000 | ORAL_TABLET | Freq: Every day | ORAL | 1 refills | Status: DC
Start: 2023-08-13 — End: 2024-09-14

## 2023-08-13 MED ORDER — ROSUVASTATIN CALCIUM 10 MG PO TABS: 10.0000 mg | ORAL_TABLET | Freq: Every day | ORAL | 3 refills | Status: DC

## 2023-08-13 MED ORDER — AMLODIPINE BESYLATE 10 MG PO TABS
10.0000 mg | ORAL_TABLET | Freq: Every day | ORAL | 2 refills | Status: DC
Start: 2023-08-13 — End: 2024-05-12
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
## 2023-08-13 NOTE — Patient Instructions (Signed)
Thank you so much for coming to the clinic today!   Today, we discussed your diabetes, hypertension, and hyperlipidemia.  I have put in refills for medications.  We also gathered different labs for evaluation and I will follow-up via phone call when these lab results come back.  We have also provided you a card for the clinic with the fax number.  Please fax over the information for the wheelchair and we will sign this for you.  If you have any issues, please let the clinic know.  If you have any questions please feel free to the call the clinic at anytime at [phone]. It was a pleasure seeing you!  Best, Dr. Rayvon Char

## 2023-08-14 DIAGNOSIS — N312 Flaccid neuropathic bladder, not elsewhere classified: Secondary | ICD-10-CM | POA: Diagnosis not present

## 2023-08-14 LAB — IRON,TIBC AND FERRITIN PANEL
Ferritin: 248 ng/mL (ref 30–400)
Iron Saturation: 16 % (ref 15–55)
Iron: 36 ug/dL — ABNORMAL LOW (ref 38–169)
Total Iron Binding Capacity: 219 ug/dL — ABNORMAL LOW (ref 250–450)
UIBC: 183 ug/dL (ref 111–343)

## 2023-08-14 LAB — LIPID PANEL
Chol/HDL Ratio: 2.5 ratio (ref 0.0–5.0)
Cholesterol, Total: 65 mg/dL — ABNORMAL LOW (ref 100–199)
HDL: 26 mg/dL — ABNORMAL LOW (ref >39)
LDL Chol Calc (NIH): 18 mg/dL (ref 0–99)
Triglycerides: 112 mg/dL (ref 0–149)
VLDL Cholesterol Cal: 21 mg/dL (ref 5–40)

## 2023-08-14 LAB — CBC
Hematocrit: 42.5 % (ref 37.5–51.0)
Hemoglobin: 13.7 g/dL (ref 13.0–17.7)
MCH: 25.1 pg — ABNORMAL LOW (ref 26.6–33.0)
MCHC: 32.2 g/dL (ref 31.5–35.7)
MCV: 78 fL — ABNORMAL LOW (ref 79–97)
Platelets: 393 10*3/uL (ref 150–450)
RBC: 5.46 x10E6/uL (ref 4.14–5.80)
RDW: 16.4 % — ABNORMAL HIGH (ref 11.6–15.4)
WBC: 9.2 10*3/uL (ref 3.4–10.8)

## 2023-08-14 LAB — MICROALBUMIN / CREATININE URINE RATIO
Creatinine, Urine: 34.9 mg/dL
Microalb/Creat Ratio: 660 mg/g{creat} — ABNORMAL HIGH (ref 0–29)
Microalbumin, Urine: 230.5 ug/mL

## 2023-08-14 LAB — BMP8+ANION GAP
Anion Gap: 16 mmol/L (ref 10.0–18.0)
BUN/Creatinine Ratio: 17 (ref 10–24)
BUN: 12 mg/dL (ref 8–27)
CO2: 18 mmol/L — ABNORMAL LOW (ref 20–29)
Calcium: 8.6 mg/dL (ref 8.6–10.2)
Chloride: 100 mmol/L (ref 96–106)
Creatinine, Ser: 0.69 mg/dL — ABNORMAL LOW (ref 0.76–1.27)
Glucose: 104 mg/dL — ABNORMAL HIGH (ref 70–99)
Potassium: 4.3 mmol/L (ref 3.5–5.2)
Sodium: 134 mmol/L (ref 134–144)
eGFR: 98 mL/min/{1.73_m2} (ref >59)

## 2023-08-14 NOTE — Assessment & Plan Note (Signed)
Patient reports blood pressure will be in the 130s/70s.  Today, his blood pressure was 100/70.  He is currently on amlodipine 10 mg daily & losartan 100 mg daily.  Chest pain, headaches, or any other signs or symptoms. - Continue amlodipine 10 mg daily and losartan 100 mg daily

## 2023-08-14 NOTE — Assessment & Plan Note (Signed)
At last visit, patient was noted to have diminished pulses in the left lower extremity.  This was followed up with a vascular ultrasound ABI.  Is found resting left ABI indicating moderate left upper extremity arterial disease.  Patient notes no numbness, tingling, weakness, or any other acute signs or symptoms in his left lower extremity.  His last lipid panel was in 08/2022 which showed triglycerides elevated at 174 and his HDL down to 28 - Continue Crestor 10 mg - Follow-up lipid panel

## 2023-08-14 NOTE — Assessment & Plan Note (Signed)
Patient was last seen on 4//24 in the clinic.  Currently taking Synjardy 5-500 daily. Last A1c 03/13/23 was 7.0. Repeat A1c today is 6.4.  Patient notes that at home, his glucose has been in the 110s to 120s with no reported highs or lows in the past 4 months.  He is followed by his ophthalmologist regularly and is scheduled to go back in January. - Continue Synjardy 5-500 daily - Follow-up ACR - Follow-up BMP

## 2023-08-14 NOTE — Progress Notes (Signed)
Attempt #1 to reach out to patient via phone to discuss abnormal lab results. Would like to discuss the effect of his diabetes on these values and the importance of medication adherence.

## 2023-08-14 NOTE — Assessment & Plan Note (Signed)
Patient was hoping for referral form for an electric wheelchair.  He currently cannot ambulate.  I did send her that this has already been approved from Medicare.  After discussion with patient and his partner, we recommended that they fax over the forms so that we can sign them.

## 2023-08-14 NOTE — Assessment & Plan Note (Signed)
Patient low on medications and needs refill.  No change in the pain as is currently well-managed with current pain regimen. - Continue gabapentin 400 mg 2 times daily

## 2023-08-14 NOTE — Assessment & Plan Note (Signed)
This was last checked on 4//24 in which his TIBC was decreased at 240.  He denies any lightheadedness, dizziness, or other signs or symptoms consistent with anemia.  He is taking his iron pills every other day. - Follow-up CBC and iron studies

## 2023-08-15 ENCOUNTER — Other Ambulatory Visit: Payer: Self-pay | Admitting: Student

## 2023-08-15 NOTE — Progress Notes (Signed)
Reached out to patient to discuss his abnormal lab findings.  Patient's wife answered who is responsible for administering medications for patient.  Confirmed that patient is taking Synjardy and losartan.  Patient is understanding that this will be important for addressing the microalbumin/creatinine ratio.  Also discussed the very low LDL levels.  Confirmed that patient will stop taking Zetia but continue with his statin.  For patient's iron levels, because he is no longer iron deficient, confirmed that patient will no longer take his iron supplementation.

## 2023-08-17 NOTE — Progress Notes (Signed)
Internal Medicine Clinic Attending  I was physically present during the key portions of the resident provided service and participated in the medical decision making of patient's management care. I reviewed pertinent patient test results.  The assessment, diagnosis, and plan were formulated together and I agree with the documentation in the resident's note with the following correction:  ABI of lower extremity indicating moderate left LOWER extremity arterial disease, not upper as outlined in the resident's note. Followed with vascular surgery 05/2023. Not currently on aspirin therapy. Per chart review it appears he was not on this in the past due to severe GI bleed.  Dickie La, MD

## 2023-08-17 NOTE — Addendum Note (Signed)
Addended by: Dickie La on: 08/17/2023 07:01 PM   Modules accepted: Level of Service

## 2023-08-20 DIAGNOSIS — Z89611 Acquired absence of right leg above knee: Secondary | ICD-10-CM | POA: Diagnosis not present

## 2023-08-20 DIAGNOSIS — L89314 Pressure ulcer of right buttock, stage 4: Secondary | ICD-10-CM | POA: Diagnosis not present

## 2023-08-20 DIAGNOSIS — G8220 Paraplegia, unspecified: Secondary | ICD-10-CM | POA: Diagnosis not present

## 2023-08-20 DIAGNOSIS — L89154 Pressure ulcer of sacral region, stage 4: Secondary | ICD-10-CM | POA: Diagnosis not present

## 2023-08-20 DIAGNOSIS — Z87891 Personal history of nicotine dependence: Secondary | ICD-10-CM | POA: Diagnosis not present

## 2023-09-09 DIAGNOSIS — N312 Flaccid neuropathic bladder, not elsewhere classified: Secondary | ICD-10-CM | POA: Diagnosis not present

## 2023-09-30 DIAGNOSIS — D849 Immunodeficiency, unspecified: Secondary | ICD-10-CM | POA: Diagnosis not present

## 2023-09-30 DIAGNOSIS — I10 Essential (primary) hypertension: Secondary | ICD-10-CM | POA: Diagnosis not present

## 2023-09-30 DIAGNOSIS — M069 Rheumatoid arthritis, unspecified: Secondary | ICD-10-CM | POA: Diagnosis not present

## 2023-09-30 DIAGNOSIS — Z0000 Encounter for general adult medical examination without abnormal findings: Secondary | ICD-10-CM | POA: Diagnosis not present

## 2023-10-01 DIAGNOSIS — L89314 Pressure ulcer of right buttock, stage 4: Secondary | ICD-10-CM | POA: Diagnosis not present

## 2023-10-02 DIAGNOSIS — I10 Essential (primary) hypertension: Secondary | ICD-10-CM | POA: Diagnosis not present

## 2023-10-02 DIAGNOSIS — Z993 Dependence on wheelchair: Secondary | ICD-10-CM | POA: Diagnosis not present

## 2023-10-02 DIAGNOSIS — Z89611 Acquired absence of right leg above knee: Secondary | ICD-10-CM | POA: Diagnosis not present

## 2023-10-02 DIAGNOSIS — L89314 Pressure ulcer of right buttock, stage 4: Secondary | ICD-10-CM | POA: Diagnosis not present

## 2023-10-02 DIAGNOSIS — M069 Rheumatoid arthritis, unspecified: Secondary | ICD-10-CM | POA: Diagnosis not present

## 2023-10-02 DIAGNOSIS — G8220 Paraplegia, unspecified: Secondary | ICD-10-CM | POA: Diagnosis not present

## 2023-10-02 DIAGNOSIS — D849 Immunodeficiency, unspecified: Secondary | ICD-10-CM | POA: Diagnosis not present

## 2023-10-02 DIAGNOSIS — E119 Type 2 diabetes mellitus without complications: Secondary | ICD-10-CM | POA: Diagnosis not present

## 2023-10-02 DIAGNOSIS — Z4689 Encounter for fitting and adjustment of other specified devices: Secondary | ICD-10-CM | POA: Diagnosis not present

## 2023-10-07 DIAGNOSIS — N312 Flaccid neuropathic bladder, not elsewhere classified: Secondary | ICD-10-CM | POA: Diagnosis not present

## 2023-10-08 ENCOUNTER — Ambulatory Visit: Payer: Medicare Other

## 2023-10-08 VITALS — Ht 73.0 in | Wt 185.0 lb

## 2023-10-08 DIAGNOSIS — Z0000 Encounter for general adult medical examination without abnormal findings: Secondary | ICD-10-CM | POA: Diagnosis not present
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
## 2023-10-08 NOTE — Patient Instructions (Signed)
Mr. Bachmann , Thank you for taking time to come for your Medicare Wellness Visit. I appreciate your ongoing commitment to your health goals. Please review the following plan we discussed and let me know if I can assist you in the future.   Referrals/Orders/Follow-Ups/Clinician Recommendations: You are due for a Shingles vaccine.  You can get this at your local pharmacy.  It was nice speaking with you and Mrs. Blum today and keep up the good work.  This is a list of the screening recommended for you and due dates:  Health Maintenance  Topic Date Due   Zoster (Shingles) Vaccine (1 of 2) Never done   Hemoglobin A1C  02/10/2024   Eye exam for diabetics  02/19/2024   Complete foot exam   03/12/2024   Yearly kidney function blood test for diabetes  08/12/2024   Yearly kidney health urinalysis for diabetes  08/12/2024   Lipid (cholesterol) test  08/12/2024   Medicare Annual Wellness Visit  10/07/2024   Colon Cancer Screening  08/28/2027   DTaP/Tdap/Td vaccine (3 - Td or Tdap) 10/01/2032   Pneumonia Vaccine  Completed   Flu Shot  Completed   Hepatitis C Screening  Completed   HPV Vaccine  Aged Out   COVID-19 Vaccine  Discontinued    Advanced directives: (Copy Requested) Please bring a copy of your health care power of attorney and living will to the office to be added to your chart at your convenience.  Next Medicare Annual Wellness Visit scheduled for next year: Yes

## 2023-11-04 DIAGNOSIS — N312 Flaccid neuropathic bladder, not elsewhere classified: Secondary | ICD-10-CM | POA: Diagnosis not present

## 2023-11-12 ENCOUNTER — Encounter: Payer: Medicare Other | Admitting: Student

## 2023-11-12 DIAGNOSIS — L89314 Pressure ulcer of right buttock, stage 4: Secondary | ICD-10-CM | POA: Diagnosis not present

## 2023-11-12 DIAGNOSIS — R799 Abnormal finding of blood chemistry, unspecified: Secondary | ICD-10-CM | POA: Diagnosis not present

## 2023-11-12 DIAGNOSIS — Z79899 Other long term (current) drug therapy: Secondary | ICD-10-CM | POA: Diagnosis not present

## 2023-11-12 DIAGNOSIS — R600 Localized edema: Secondary | ICD-10-CM | POA: Diagnosis not present

## 2023-11-12 DIAGNOSIS — L89154 Pressure ulcer of sacral region, stage 4: Secondary | ICD-10-CM | POA: Diagnosis not present

## 2023-11-12 DIAGNOSIS — Z89611 Acquired absence of right leg above knee: Secondary | ICD-10-CM | POA: Diagnosis not present

## 2023-11-12 DIAGNOSIS — I96 Gangrene, not elsewhere classified: Secondary | ICD-10-CM | POA: Diagnosis not present

## 2023-11-12 DIAGNOSIS — F17210 Nicotine dependence, cigarettes, uncomplicated: Secondary | ICD-10-CM | POA: Diagnosis not present

## 2023-11-12 DIAGNOSIS — G8220 Paraplegia, unspecified: Secondary | ICD-10-CM | POA: Diagnosis not present

## 2023-11-26 ENCOUNTER — Encounter: Payer: Medicare Other | Admitting: Student

## 2023-11-26 DIAGNOSIS — Z79899 Other long term (current) drug therapy: Secondary | ICD-10-CM | POA: Diagnosis not present

## 2023-11-26 DIAGNOSIS — R600 Localized edema: Secondary | ICD-10-CM | POA: Diagnosis not present

## 2023-11-26 DIAGNOSIS — L89154 Pressure ulcer of sacral region, stage 4: Secondary | ICD-10-CM | POA: Diagnosis not present

## 2023-11-26 DIAGNOSIS — M4628 Osteomyelitis of vertebra, sacral and sacrococcygeal region: Secondary | ICD-10-CM | POA: Diagnosis not present

## 2023-11-26 DIAGNOSIS — F17210 Nicotine dependence, cigarettes, uncomplicated: Secondary | ICD-10-CM | POA: Diagnosis not present

## 2023-11-26 DIAGNOSIS — M86651 Other chronic osteomyelitis, right thigh: Secondary | ICD-10-CM | POA: Diagnosis not present

## 2023-11-26 DIAGNOSIS — L89314 Pressure ulcer of right buttock, stage 4: Secondary | ICD-10-CM | POA: Diagnosis not present

## 2023-11-26 DIAGNOSIS — I96 Gangrene, not elsewhere classified: Secondary | ICD-10-CM | POA: Diagnosis not present

## 2023-11-26 DIAGNOSIS — G8220 Paraplegia, unspecified: Secondary | ICD-10-CM | POA: Diagnosis not present

## 2023-11-26 DIAGNOSIS — Z89611 Acquired absence of right leg above knee: Secondary | ICD-10-CM | POA: Diagnosis not present

## 2023-11-28 ENCOUNTER — Encounter: Payer: Medicare Other | Admitting: Student

## 2023-12-01 ENCOUNTER — Encounter: Payer: Self-pay | Admitting: Internal Medicine

## 2023-12-01 ENCOUNTER — Ambulatory Visit (INDEPENDENT_AMBULATORY_CARE_PROVIDER_SITE_OTHER): Payer: Medicare Other | Admitting: Internal Medicine

## 2023-12-01 VITALS — BP 133/77 | HR 58 | Temp 97.8°F | Ht 73.0 in

## 2023-12-01 DIAGNOSIS — E785 Hyperlipidemia, unspecified: Secondary | ICD-10-CM

## 2023-12-01 DIAGNOSIS — L89310 Pressure ulcer of right buttock, unstageable: Secondary | ICD-10-CM | POA: Diagnosis not present

## 2023-12-01 DIAGNOSIS — I10 Essential (primary) hypertension: Secondary | ICD-10-CM | POA: Diagnosis not present

## 2023-12-01 DIAGNOSIS — Z7984 Long term (current) use of oral hypoglycemic drugs: Secondary | ICD-10-CM | POA: Diagnosis not present

## 2023-12-01 DIAGNOSIS — E0800 Diabetes mellitus due to underlying condition with hyperosmolarity without nonketotic hyperglycemic-hyperosmolar coma (NKHHC): Secondary | ICD-10-CM

## 2023-12-01 DIAGNOSIS — E119 Type 2 diabetes mellitus without complications: Secondary | ICD-10-CM
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
## 2023-12-01 NOTE — Patient Instructions (Addendum)
Shawn L Bonney Roussel., it was a pleasure seeing you today! You endorsed feeling well today. Below are some of the things we talked about this visit. We look forward to seeing you in the follow up appointment!  Today we discussed: Continue taking all your medications. Your diabetes and blood pressure look great.   I will look into synjardy and to see if we can reduce the cost of this medication.   Please follow up with your wound doctors next month. Increase your protein intake and minimize your cigarette intake.   I have ordered the following labs today:  Lab Orders  No laboratory test(s) ordered today      Referrals ordered today:   Referral Orders  No referral(s) requested today     I have ordered the following medication/changed the following medications:   Stop the following medications: There are no discontinued medications.   Start the following medications: No orders of the defined types were placed in this encounter.    Follow-up: 3 month follow up   Please make sure to arrive 15 minutes prior to your next appointment. If you arrive late, you may be asked to reschedule.   We look forward to seeing you next time. Please call our clinic at [phone] if you have any questions or concerns. The best time to call is Monday-Friday from 9am-4pm, but there is someone available 24/7. If after hours or the weekend, call the main hospital number and ask for the Internal Medicine Resident On-Call. If you need medication refills, please notify your pharmacy one week in advance and they will send [place] a request.  Thank you for letting us take part in your care. Wishing you the best!  Thank you, Gwenevere Abbot, MD

## 2023-12-03 NOTE — Assessment & Plan Note (Addendum)
Pt following with [facility] for his chronic wounds. I personally read their office notes and their concern is increasing size of wounds compared to previous. Reinforced their recommendations of good wound care and to increase protein intake and smoking cessation. Advised patient to document his protein intake so we can track it. Pt's next appointment is in January 2025.

## 2023-12-03 NOTE — Assessment & Plan Note (Signed)
On Synjarday 5-500 mg every day. A1c 6.5 in 11/2023. Will check with our pharmacy team the cost of invokana separately as pt is paying around $180 monthly for his Synjarday.

## 2023-12-03 NOTE — Assessment & Plan Note (Signed)
Well controlled on current regimen of amlodipine 10 mg and losartan 100 mg every day. Normal renal function in 11/2023. Will continue current regimen.

## 2023-12-03 NOTE — Assessment & Plan Note (Signed)
Pt with hx of HLD on Crestor 10 mg every day. LDL 18 in 08/2023. Continue current regimen.

## 2023-12-04 NOTE — Progress Notes (Signed)
 Internal Medicine Clinic Attending  Case discussed with the resident physician at the time of the visit.  We reviewed the patient's history, exam, and pertinent patient test results.  I agree with the assessment, diagnosis, and plan of care documented in the resident's note.

## 2023-12-11 DIAGNOSIS — N390 Urinary tract infection, site not specified: Secondary | ICD-10-CM | POA: Diagnosis not present

## 2023-12-11 DIAGNOSIS — N312 Flaccid neuropathic bladder, not elsewhere classified: Secondary | ICD-10-CM | POA: Diagnosis not present

## 2023-12-24 DIAGNOSIS — Z961 Presence of intraocular lens: Secondary | ICD-10-CM | POA: Diagnosis not present

## 2023-12-24 DIAGNOSIS — G8220 Paraplegia, unspecified: Secondary | ICD-10-CM | POA: Diagnosis not present

## 2023-12-24 DIAGNOSIS — H43813 Vitreous degeneration, bilateral: Secondary | ICD-10-CM | POA: Diagnosis not present

## 2023-12-24 DIAGNOSIS — L89154 Pressure ulcer of sacral region, stage 4: Secondary | ICD-10-CM | POA: Diagnosis not present

## 2023-12-24 DIAGNOSIS — M8660 Other chronic osteomyelitis, unspecified site: Secondary | ICD-10-CM | POA: Diagnosis not present

## 2023-12-24 DIAGNOSIS — L89314 Pressure ulcer of right buttock, stage 4: Secondary | ICD-10-CM | POA: Diagnosis not present

## 2023-12-24 DIAGNOSIS — E083293 Diabetes mellitus due to underlying condition with mild nonproliferative diabetic retinopathy without macular edema, bilateral: Secondary | ICD-10-CM | POA: Diagnosis not present

## 2023-12-24 DIAGNOSIS — E119 Type 2 diabetes mellitus without complications: Secondary | ICD-10-CM | POA: Diagnosis not present

## 2023-12-24 DIAGNOSIS — E113293 Type 2 diabetes mellitus with mild nonproliferative diabetic retinopathy without macular edema, bilateral: Secondary | ICD-10-CM | POA: Diagnosis not present

## 2023-12-24 DIAGNOSIS — Z91199 Patient's noncompliance with other medical treatment and regimen due to unspecified reason: Secondary | ICD-10-CM | POA: Diagnosis not present

## 2023-12-24 DIAGNOSIS — H401132 Primary open-angle glaucoma, bilateral, moderate stage: Secondary | ICD-10-CM | POA: Diagnosis not present

## 2023-12-24 LAB — HM DIABETES EYE EXAM

## 2023-12-30 ENCOUNTER — Encounter: Payer: Self-pay | Admitting: Dietician

## 2024-01-16 DIAGNOSIS — N312 Flaccid neuropathic bladder, not elsewhere classified: Secondary | ICD-10-CM | POA: Diagnosis not present

## 2024-01-16 DIAGNOSIS — R8289 Other abnormal findings on cytological and histological examination of urine: Secondary | ICD-10-CM | POA: Diagnosis not present

## 2024-02-04 DIAGNOSIS — F17200 Nicotine dependence, unspecified, uncomplicated: Secondary | ICD-10-CM | POA: Diagnosis not present

## 2024-02-04 DIAGNOSIS — E8809 Other disorders of plasma-protein metabolism, not elsewhere classified: Secondary | ICD-10-CM | POA: Diagnosis not present

## 2024-02-04 DIAGNOSIS — E1149 Type 2 diabetes mellitus with other diabetic neurological complication: Secondary | ICD-10-CM | POA: Diagnosis not present

## 2024-02-04 DIAGNOSIS — M869 Osteomyelitis, unspecified: Secondary | ICD-10-CM | POA: Diagnosis not present

## 2024-02-04 DIAGNOSIS — Z89611 Acquired absence of right leg above knee: Secondary | ICD-10-CM | POA: Diagnosis not present

## 2024-02-04 DIAGNOSIS — L89314 Pressure ulcer of right buttock, stage 4: Secondary | ICD-10-CM | POA: Diagnosis not present

## 2024-02-04 DIAGNOSIS — G8220 Paraplegia, unspecified: Secondary | ICD-10-CM | POA: Diagnosis not present

## 2024-02-04 DIAGNOSIS — I96 Gangrene, not elsewhere classified: Secondary | ICD-10-CM | POA: Diagnosis not present

## 2024-02-10 DIAGNOSIS — N312 Flaccid neuropathic bladder, not elsewhere classified: Secondary | ICD-10-CM | POA: Diagnosis not present

## 2024-03-01 ENCOUNTER — Ambulatory Visit: Payer: Medicare Other | Admitting: Student

## 2024-03-01 VITALS — BP 135/76 | HR 90 | Temp 98.1°F

## 2024-03-01 DIAGNOSIS — E0800 Diabetes mellitus due to underlying condition with hyperosmolarity without nonketotic hyperglycemic-hyperosmolar coma (NKHHC): Secondary | ICD-10-CM

## 2024-03-01 DIAGNOSIS — I10 Essential (primary) hypertension: Secondary | ICD-10-CM

## 2024-03-01 DIAGNOSIS — E119 Type 2 diabetes mellitus without complications: Secondary | ICD-10-CM

## 2024-03-01 DIAGNOSIS — Z7984 Long term (current) use of oral hypoglycemic drugs: Secondary | ICD-10-CM

## 2024-03-01 DIAGNOSIS — E785 Hyperlipidemia, unspecified: Secondary | ICD-10-CM | POA: Diagnosis not present

## 2024-03-01 LAB — GLUCOSE, CAPILLARY: Glucose-Capillary: 124 mg/dL — ABNORMAL HIGH (ref 70–99)

## 2024-03-01 LAB — POCT GLYCOSYLATED HEMOGLOBIN (HGB A1C): Hemoglobin A1C: 5.9 % — AB (ref 4.0–5.6)

## 2024-03-01 NOTE — Patient Instructions (Signed)
 Thank you so much for coming to the clinic today!   Please continue taking your current medications as prescribed.  We will see you in 6 months.   If you have any questions please feel free to the call the clinic at anytime at [phone]. It was a pleasure seeing you!  Best, Dr. Rayvon Char

## 2024-03-01 NOTE — Assessment & Plan Note (Signed)
 Current medications include amlodipine 10 mg daily and losartan 100 mg daily. Blood pressure today is 135/76. Last CMP in 11/2023 demonstrated normal renal function.  - Continue amlodipine 10 mg daily and losartan 100 mg daily  - Repeat CMP at next visit in 6 months
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
## 2024-03-01 NOTE — Assessment & Plan Note (Signed)
 Current medications include Synjardy 5-500 daily. Last A1c in 11/2023 was 6.5. Today, A1c is 5.9. Denies any signs/symptoms.  - Continue Synjdary 5-500 daily

## 2024-03-01 NOTE — Assessment & Plan Note (Signed)
 Current medication includes rosuvastatin 10 mg daily. Last lipid panel 08/2023 notable for LDL unremarkable.  - Continue rosuvastatin 10 mg daily

## 2024-03-02 NOTE — Progress Notes (Signed)
 Internal Medicine Clinic Attending  Case discussed with the resident at the time of the visit.  We reviewed the resident's history and exam and pertinent patient test results.  I agree with the assessment, diagnosis, and plan of care documented in the resident's note.

## 2024-03-10 DIAGNOSIS — L89314 Pressure ulcer of right buttock, stage 4: Secondary | ICD-10-CM | POA: Diagnosis not present

## 2024-03-10 DIAGNOSIS — Z89611 Acquired absence of right leg above knee: Secondary | ICD-10-CM | POA: Diagnosis not present

## 2024-03-10 DIAGNOSIS — G8220 Paraplegia, unspecified: Secondary | ICD-10-CM | POA: Diagnosis not present

## 2024-03-10 DIAGNOSIS — I96 Gangrene, not elsewhere classified: Secondary | ICD-10-CM | POA: Diagnosis not present

## 2024-03-10 DIAGNOSIS — F17200 Nicotine dependence, unspecified, uncomplicated: Secondary | ICD-10-CM | POA: Diagnosis not present

## 2024-03-16 DIAGNOSIS — N312 Flaccid neuropathic bladder, not elsewhere classified: Secondary | ICD-10-CM | POA: Diagnosis not present

## 2024-04-13 DIAGNOSIS — N312 Flaccid neuropathic bladder, not elsewhere classified: Secondary | ICD-10-CM | POA: Diagnosis not present

## 2024-04-14 DIAGNOSIS — L89154 Pressure ulcer of sacral region, stage 4: Secondary | ICD-10-CM | POA: Diagnosis not present

## 2024-04-14 DIAGNOSIS — F17200 Nicotine dependence, unspecified, uncomplicated: Secondary | ICD-10-CM | POA: Diagnosis not present

## 2024-04-14 DIAGNOSIS — G8220 Paraplegia, unspecified: Secondary | ICD-10-CM | POA: Diagnosis not present

## 2024-04-14 DIAGNOSIS — I96 Gangrene, not elsewhere classified: Secondary | ICD-10-CM | POA: Diagnosis not present

## 2024-04-14 DIAGNOSIS — Z91198 Patient's noncompliance with other medical treatment and regimen for other reason: Secondary | ICD-10-CM | POA: Diagnosis not present

## 2024-04-14 DIAGNOSIS — E8809 Other disorders of plasma-protein metabolism, not elsewhere classified: Secondary | ICD-10-CM | POA: Diagnosis not present

## 2024-04-14 DIAGNOSIS — M8669 Other chronic osteomyelitis, multiple sites: Secondary | ICD-10-CM | POA: Diagnosis not present

## 2024-04-14 DIAGNOSIS — L89314 Pressure ulcer of right buttock, stage 4: Secondary | ICD-10-CM | POA: Diagnosis not present

## 2024-04-16 ENCOUNTER — Other Ambulatory Visit: Payer: Self-pay | Admitting: Student

## 2024-04-16 DIAGNOSIS — G546 Phantom limb syndrome with pain: Secondary | ICD-10-CM

## 2024-04-16 DIAGNOSIS — Z89611 Acquired absence of right leg above knee: Secondary | ICD-10-CM

## 2024-05-11 DIAGNOSIS — N312 Flaccid neuropathic bladder, not elsewhere classified: Secondary | ICD-10-CM | POA: Diagnosis not present

## 2024-05-12 ENCOUNTER — Other Ambulatory Visit: Payer: Self-pay | Admitting: Student

## 2024-05-12 DIAGNOSIS — E43 Unspecified severe protein-calorie malnutrition: Secondary | ICD-10-CM

## 2024-05-12 DIAGNOSIS — I10 Essential (primary) hypertension: Secondary | ICD-10-CM

## 2024-05-12 NOTE — Telephone Encounter (Signed)
 Medication sent to pharmacy

## 2024-05-19 ENCOUNTER — Encounter: Payer: Self-pay | Admitting: *Deleted

## 2024-06-08 DIAGNOSIS — N312 Flaccid neuropathic bladder, not elsewhere classified: Secondary | ICD-10-CM | POA: Diagnosis not present

## 2024-06-23 DIAGNOSIS — L89153 Pressure ulcer of sacral region, stage 3: Secondary | ICD-10-CM | POA: Diagnosis not present

## 2024-06-23 DIAGNOSIS — L89154 Pressure ulcer of sacral region, stage 4: Secondary | ICD-10-CM | POA: Diagnosis not present

## 2024-06-23 DIAGNOSIS — M86651 Other chronic osteomyelitis, right thigh: Secondary | ICD-10-CM | POA: Diagnosis not present

## 2024-06-23 DIAGNOSIS — L89314 Pressure ulcer of right buttock, stage 4: Secondary | ICD-10-CM | POA: Diagnosis not present

## 2024-06-23 DIAGNOSIS — Z91199 Patient's noncompliance with other medical treatment and regimen due to unspecified reason: Secondary | ICD-10-CM | POA: Diagnosis not present

## 2024-06-23 DIAGNOSIS — Z993 Dependence on wheelchair: Secondary | ICD-10-CM | POA: Diagnosis not present

## 2024-06-23 DIAGNOSIS — F17200 Nicotine dependence, unspecified, uncomplicated: Secondary | ICD-10-CM | POA: Diagnosis not present

## 2024-07-13 DIAGNOSIS — N312 Flaccid neuropathic bladder, not elsewhere classified: Secondary | ICD-10-CM | POA: Diagnosis not present

## 2024-08-04 DIAGNOSIS — Z7984 Long term (current) use of oral hypoglycemic drugs: Secondary | ICD-10-CM | POA: Diagnosis not present

## 2024-08-04 DIAGNOSIS — F17210 Nicotine dependence, cigarettes, uncomplicated: Secondary | ICD-10-CM | POA: Diagnosis not present

## 2024-08-04 DIAGNOSIS — K219 Gastro-esophageal reflux disease without esophagitis: Secondary | ICD-10-CM | POA: Diagnosis not present

## 2024-08-04 DIAGNOSIS — L89314 Pressure ulcer of right buttock, stage 4: Secondary | ICD-10-CM | POA: Diagnosis not present

## 2024-08-04 DIAGNOSIS — G8220 Paraplegia, unspecified: Secondary | ICD-10-CM | POA: Diagnosis not present

## 2024-08-04 DIAGNOSIS — I739 Peripheral vascular disease, unspecified: Secondary | ICD-10-CM | POA: Diagnosis not present

## 2024-08-04 DIAGNOSIS — I10 Essential (primary) hypertension: Secondary | ICD-10-CM | POA: Diagnosis not present

## 2024-08-04 DIAGNOSIS — Z89611 Acquired absence of right leg above knee: Secondary | ICD-10-CM | POA: Diagnosis not present

## 2024-08-04 DIAGNOSIS — Z794 Long term (current) use of insulin: Secondary | ICD-10-CM | POA: Diagnosis not present

## 2024-08-04 DIAGNOSIS — L89154 Pressure ulcer of sacral region, stage 4: Secondary | ICD-10-CM | POA: Diagnosis not present

## 2024-08-04 DIAGNOSIS — Z79899 Other long term (current) drug therapy: Secondary | ICD-10-CM | POA: Diagnosis not present

## 2024-08-04 DIAGNOSIS — L989 Disorder of the skin and subcutaneous tissue, unspecified: Secondary | ICD-10-CM | POA: Diagnosis not present

## 2024-08-10 ENCOUNTER — Other Ambulatory Visit: Payer: Self-pay

## 2024-08-10 DIAGNOSIS — E785 Hyperlipidemia, unspecified: Secondary | ICD-10-CM

## 2024-08-10 MED ORDER — ROSUVASTATIN CALCIUM 10 MG PO TABS
10.0000 mg | ORAL_TABLET | Freq: Every day | ORAL | 3 refills | Status: AC
Start: 2024-08-10 — End: 2025-08-10

## 2024-08-10 MED ORDER — LOSARTAN POTASSIUM 100 MG PO TABS: 100.0000 mg | ORAL_TABLET | Freq: Every day | ORAL | 3 refills | Status: AC

## 2024-08-10 NOTE — Telephone Encounter (Signed)
 Medication sent to pharmacy

## 2024-08-11 DIAGNOSIS — N312 Flaccid neuropathic bladder, not elsewhere classified: Secondary | ICD-10-CM | POA: Diagnosis not present

## 2024-08-30 ENCOUNTER — Telehealth: Payer: Self-pay | Admitting: *Deleted

## 2024-08-30 DIAGNOSIS — G546 Phantom limb syndrome with pain: Secondary | ICD-10-CM

## 2024-08-30 DIAGNOSIS — Z89611 Acquired absence of right leg above knee: Secondary | ICD-10-CM

## 2024-08-30 MED ORDER — GABAPENTIN 400 MG PO CAPS: 400.0000 mg | ORAL_CAPSULE | Freq: Two times a day (BID) | ORAL | 2 refills | Status: DC

## 2024-08-30 NOTE — Telephone Encounter (Signed)
 Copied from CRM [id number]. Topic: Clinical - Prescription Issue >> Aug 30, 2024  1:23 PM Cherylann RAMAN wrote: Reason for CRM: Wife, Shawn [name], called and stated that pharmacy has not received a response from provider regarding a refill request for gabapentin  (NEURONTIN ) 400 MG capsule. Please contact Mrs. Shawn [name] at [phone] *H - [phone] *C call cell if no one answers home phone.

## 2024-09-08 DIAGNOSIS — R339 Retention of urine, unspecified: Secondary | ICD-10-CM | POA: Diagnosis not present

## 2024-09-14 ENCOUNTER — Ambulatory Visit (INDEPENDENT_AMBULATORY_CARE_PROVIDER_SITE_OTHER)

## 2024-09-14 VITALS — BP 138/85 | HR 76 | Temp 98.1°F | Ht 73.0 in

## 2024-09-14 DIAGNOSIS — Z87891 Personal history of nicotine dependence: Secondary | ICD-10-CM

## 2024-09-14 DIAGNOSIS — I10 Essential (primary) hypertension: Secondary | ICD-10-CM

## 2024-09-14 DIAGNOSIS — Z23 Encounter for immunization: Secondary | ICD-10-CM | POA: Diagnosis not present

## 2024-09-14 DIAGNOSIS — Z79899 Other long term (current) drug therapy: Secondary | ICD-10-CM

## 2024-09-14 DIAGNOSIS — F17210 Nicotine dependence, cigarettes, uncomplicated: Secondary | ICD-10-CM | POA: Diagnosis not present

## 2024-09-14 DIAGNOSIS — Z91141 Patient's other noncompliance with medication regimen due to financial hardship: Secondary | ICD-10-CM

## 2024-09-14 DIAGNOSIS — E0800 Diabetes mellitus due to underlying condition with hyperosmolarity without nonketotic hyperglycemic-hyperosmolar coma (NKHHC): Secondary | ICD-10-CM | POA: Diagnosis not present

## 2024-09-14 DIAGNOSIS — E785 Hyperlipidemia, unspecified: Secondary | ICD-10-CM | POA: Diagnosis not present

## 2024-09-14 DIAGNOSIS — F17200 Nicotine dependence, unspecified, uncomplicated: Secondary | ICD-10-CM

## 2024-09-14 DIAGNOSIS — Z0000 Encounter for general adult medical examination without abnormal findings: Secondary | ICD-10-CM

## 2024-09-14 NOTE — Patient Instructions (Signed)
 Today we discussed the following medical conditions and plan:   For your diabetes we will check your A1c today which is how we measure diabetes and see if we need to add any medications.  If we do these medications that we add should be cheaper.  I will also check your kidney function today and check your cholesterol levels and we will make any adjustments to medications as we need to.  Someone should also be calling you to set up the scan of your lungs.  We look forward to seeing you next time. Please call our clinic at [phone] if you have any questions or concerns. The best time to call is Monday-Friday from 9am-4pm, but there is someone available 24/7. If you need medication refills, please notify your pharmacy one week in advance and they will send us  a request.   Thank you for trusting me with your care. Wishing you the best!   Parnell Spieler D'Mello, DO  [facility]

## 2024-09-14 NOTE — Progress Notes (Signed)
 Established Patient Office Visit  Subjective   Patient ID: Shawn [name]., male    DOB: March 23, 1951  Age: 73 y.o. MRN: [id number]  Chief Complaint  Patient presents with   Follow-up   Medical Management of Chronic Issues    HPI Shawn, [name]. is a 73 year old male with past medical history of tobacco use, right AKA, paraplegic spinal paralysis, hypertension, diabetes mellitus, chronic osteomyelitis involving pelvic region and thigh.  See problem-based assessment for more details    ROS See problem-based assessment for more details   Objective:     BP 138/85 (BP Location: Right Arm, Patient Position: Sitting, Cuff Size: Small)   Pulse 76   Temp 98.1 F (36.7 C) (Oral)   Ht 6' 1 (1.854 m)   SpO2 94%   BMI 24.41 kg/m  BP Readings from Last 3 Encounters:  09/14/24 138/85  03/01/24 135/76  12/01/23 133/77   Wt Readings from Last 3 Encounters:  10/08/23 185 lb (83.9 kg)  06/04/23 205 lb (93 kg)  10/01/22 175 lb (79.4 kg)      Physical Exam Constitution: Alert, in no acute distress Heart: Regular rate and rhythm, no murmurs heard Lungs: No respiratory distress, clear to auscultation Lower extremities: Right leg status post AKA  No results found for any visits on 09/14/24.  Last hemoglobin A1c Lab Results  Component Value Date   HGBA1C 5.9 (A) 03/01/2024      The ASCVD Risk score (Arnett DK, et al., 2019) failed to calculate for the following reasons:   The valid total cholesterol range is 130 to 320 mg/dL    Assessment & Plan:   Problem List Items Addressed This Visit     Hyperlipidemia   On rosuvastatin  10 mg daily Will recheck lipid panel today      Relevant Orders   Lipid Profile   Health care maintenance   Patient received flu shot today      Tobacco use disorder   Patient has been smoking since he was a teenager and is currently smoking a pack a week.  Was previously smoking 2 to 3 packs a week.  With smoking history would like to  screen for lung cancer.  Patient is not interested in quitting at this time   Plan Low-dose CT of the chest      Relevant Orders   CT CHEST LUNG CA SCREEN LOW DOSE W/O CM   Hypertension   Blood pressure was elevated today at 157/85.  Recheck of blood pressure was 138/85.  Patient's wife reports that blood pressure checks at home usually show blood pressures in the 130s and 140s.  Patient has not taken his medication today.  Plan: Check BMP today for kidney function and electrolytes      Diabetes mellitus (HCC)   Last A1c back in March was 5.9.  Patient has not been on Synjardy  due to cost.  Will recheck A1c today and see if any medication adjustments need to be made.  Patient sees ophthalmology yearly  Plan: Recheck A1c today      Relevant Orders   Hemoglobin A1c   Other Visit Diagnoses       Essential hypertension    -  Primary   Relevant Orders   Basic metabolic panel with GFR     History of tobacco use       Relevant Orders   CT CHEST LUNG CA SCREEN LOW DOSE W/O CM       Return in about  4 months (around 01/15/2025).    Shawn Hukill D'Mello, DO Patient seen with Dr. Shawn

## 2024-09-14 NOTE — Assessment & Plan Note (Signed)
 Blood pressure was elevated today at 157/85.  Recheck of blood pressure was 138/85.  Patient's wife reports that blood pressure checks at home usually show blood pressures in the 130s and 140s.  Patient has not taken his medication today.  Plan: Check BMP today for kidney function and electrolytes

## 2024-09-14 NOTE — Assessment & Plan Note (Signed)
 Patient has been smoking since he was a teenager and is currently smoking a pack a week.  Was previously smoking 2 to 3 packs a week.  With smoking history would like to screen for lung cancer.  Patient is not interested in quitting at this time   Plan Low-dose CT of the chest

## 2024-09-14 NOTE — Assessment & Plan Note (Signed)
 On rosuvastatin  10 mg daily Will recheck lipid panel today

## 2024-09-14 NOTE — Assessment & Plan Note (Signed)
 Patient received flu shot today

## 2024-09-14 NOTE — Assessment & Plan Note (Signed)
 Last A1c back in March was 5.9.  Patient has not been on Synjardy  due to cost.  Will recheck A1c today and see if any medication adjustments need to be made.  Patient sees ophthalmology yearly  Plan: Recheck A1c today

## 2024-09-15 ENCOUNTER — Ambulatory Visit: Payer: Self-pay

## 2024-09-15 ENCOUNTER — Other Ambulatory Visit: Payer: Self-pay

## 2024-09-15 DIAGNOSIS — I10 Essential (primary) hypertension: Secondary | ICD-10-CM | POA: Diagnosis not present

## 2024-09-15 DIAGNOSIS — L89154 Pressure ulcer of sacral region, stage 4: Secondary | ICD-10-CM | POA: Diagnosis not present

## 2024-09-15 DIAGNOSIS — F17200 Nicotine dependence, unspecified, uncomplicated: Secondary | ICD-10-CM | POA: Diagnosis not present

## 2024-09-15 DIAGNOSIS — L89314 Pressure ulcer of right buttock, stage 4: Secondary | ICD-10-CM | POA: Diagnosis not present

## 2024-09-15 DIAGNOSIS — G8220 Paraplegia, unspecified: Secondary | ICD-10-CM | POA: Diagnosis not present

## 2024-09-15 DIAGNOSIS — Z89611 Acquired absence of right leg above knee: Secondary | ICD-10-CM | POA: Diagnosis not present

## 2024-09-15 LAB — LIPID PANEL
Chol/HDL Ratio: 3.2 ratio (ref 0.0–5.0)
Cholesterol, Total: 86 mg/dL — ABNORMAL LOW (ref 100–199)
HDL: 27 mg/dL — ABNORMAL LOW (ref >39)
LDL Chol Calc (NIH): 34 mg/dL (ref 0–99)
Triglycerides: 141 mg/dL (ref 0–149)
VLDL Cholesterol Cal: 25 mg/dL (ref 5–40)

## 2024-09-15 LAB — BASIC METABOLIC PANEL WITH GFR
BUN/Creatinine Ratio: 18 (ref 10–24)
BUN: 14 mg/dL (ref 8–27)
CO2: 20 mmol/L (ref 20–29)
Calcium: 8.8 mg/dL (ref 8.6–10.2)
Chloride: 102 mmol/L (ref 96–106)
Creatinine, Ser: 0.8 mg/dL (ref 0.76–1.27)
Glucose: 91 mg/dL (ref 70–99)
Potassium: 4.7 mmol/L (ref 3.5–5.2)
Sodium: 137 mmol/L (ref 134–144)
eGFR: 93 mL/min/1.73 (ref >59)

## 2024-09-15 LAB — HEMOGLOBIN A1C
Est. average glucose Bld gHb Est-mCnc: 137 mg/dL
Hgb A1c MFr Bld: 6.4 % — ABNORMAL HIGH (ref 4.8–5.6)

## 2024-09-15 MED ORDER — METFORMIN HCL 500 MG PO TABS
500.0000 mg | ORAL_TABLET | Freq: Every day | ORAL | 3 refills | Status: AC
  Filled 2024-09-15 – 2024-09-16 (×2): qty 90, 90d supply, fill #0

## 2024-09-15 NOTE — Progress Notes (Signed)
 Internal Medicine Clinic Attending  I was physically present during the key portions of the resident provided service and participated in the medical decision making of patient's management care. I reviewed pertinent patient test results.  The assessment, diagnosis, and plan were formulated together and I agree with the documentation in the resident's note.  Shawn Sick, MD

## 2024-09-15 NOTE — Addendum Note (Signed)
 Addended by: Eris Breck on: 09/15/2024 10:59 AM   Modules accepted: Level of Service

## 2024-09-15 NOTE — Addendum Note (Signed)
 Addended by: KEM NA on: 09/15/2024 05:45 PM   Modules accepted: Orders

## 2024-09-16 ENCOUNTER — Other Ambulatory Visit: Payer: Self-pay

## 2024-09-21 ENCOUNTER — Other Ambulatory Visit: Payer: Self-pay

## 2024-10-06 DIAGNOSIS — R338 Other retention of urine: Secondary | ICD-10-CM | POA: Diagnosis not present

## 2024-10-13 ENCOUNTER — Ambulatory Visit: Payer: Medicare Other

## 2024-10-13 DIAGNOSIS — L89154 Pressure ulcer of sacral region, stage 4: Secondary | ICD-10-CM | POA: Diagnosis not present

## 2024-10-13 DIAGNOSIS — L89314 Pressure ulcer of right buttock, stage 4: Secondary | ICD-10-CM | POA: Diagnosis not present

## 2024-10-13 DIAGNOSIS — Z89611 Acquired absence of right leg above knee: Secondary | ICD-10-CM | POA: Diagnosis not present

## 2024-10-20 ENCOUNTER — Ambulatory Visit (INDEPENDENT_AMBULATORY_CARE_PROVIDER_SITE_OTHER): Admitting: Student

## 2024-10-20 ENCOUNTER — Ambulatory Visit

## 2024-10-20 VITALS — BP 135/85 | HR 82 | Temp 97.9°F | Ht 73.0 in

## 2024-10-20 DIAGNOSIS — G8220 Paraplegia, unspecified: Secondary | ICD-10-CM | POA: Diagnosis not present

## 2024-10-20 DIAGNOSIS — Z87890 Personal history of sex reassignment: Secondary | ICD-10-CM

## 2024-10-20 DIAGNOSIS — E0800 Diabetes mellitus due to underlying condition with hyperosmolarity without nonketotic hyperglycemic-hyperosmolar coma (NKHHC): Secondary | ICD-10-CM

## 2024-10-20 DIAGNOSIS — L89310 Pressure ulcer of right buttock, unstageable: Secondary | ICD-10-CM | POA: Diagnosis not present

## 2024-10-20 DIAGNOSIS — Z87828 Personal history of other (healed) physical injury and trauma: Secondary | ICD-10-CM | POA: Diagnosis not present

## 2024-10-20 NOTE — Patient Instructions (Signed)
 Thank you so much for coming to the clinic today!   I have placed the order for home health nursing at home!  If you have any questions please feel free to the call the clinic at anytime at [phone]. It was a pleasure seeing you!  Best, Dr. Veena Sturgess
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
## 2024-10-27 ENCOUNTER — Telehealth: Payer: Self-pay

## 2024-10-28 NOTE — Telephone Encounter (Signed)
 Open in error

## 2024-10-29 ENCOUNTER — Ambulatory Visit (HOSPITAL_COMMUNITY): Attending: Internal Medicine

## 2024-11-01 ENCOUNTER — Telehealth: Payer: Self-pay | Admitting: *Deleted

## 2024-11-01 NOTE — Telephone Encounter (Signed)
 Copied from CRM #[id number]. Topic: Clinical - Home Health Verbal Orders >> Nov 01, 2024  3:47 PM Susanna ORN wrote: Caller/Agency: Butler, RN w/[facility]  Callback Number: [phone] Service Requested: Skilled Nursing Frequency: Once a week until wound care arrives Any new concerns about the patient? No

## 2024-11-01 NOTE — Telephone Encounter (Signed)
 Return call to [facility] RN,CM with [facility]. Requesting verbal order RN visit once a week until wound vac arrives then number of visit may change VO given - sending to The Red Team for approval.

## 2024-11-02 NOTE — Progress Notes (Signed)
 Internal Medicine Clinic Attending  Case discussed with the resident at the time of the visit.  We reviewed the resident's history and pertinent patient test results.  I agree with the assessment, diagnosis, and plan of care documented in the resident's note.

## 2024-11-03 DIAGNOSIS — R338 Other retention of urine: Secondary | ICD-10-CM | POA: Diagnosis not present

## 2024-11-10 DIAGNOSIS — L89154 Pressure ulcer of sacral region, stage 4: Secondary | ICD-10-CM | POA: Diagnosis not present

## 2024-11-10 DIAGNOSIS — L89314 Pressure ulcer of right buttock, stage 4: Secondary | ICD-10-CM | POA: Diagnosis not present

## 2024-11-16 NOTE — Telephone Encounter (Signed)
 Patient's wife left voice message stating he needed more wound care supplies and inquired about status of wound vac order. Confirmed with home health nurse Jacqulyn Pepper that they are supplying him with wound care supplies and she will see him again on 12/10.  Returned call and spoke to patient's wife, Erminio. Informed her that home health nurse will visit 12/10 and will take care of any supply needs and assured her that wound vac order is processing.

## 2024-11-19 ENCOUNTER — Ambulatory Visit: Payer: Self-pay

## 2024-11-19 NOTE — Telephone Encounter (Signed)
 FYI Only or Action Required?: Action required by provider: clinical question for provider.  Patient was last seen in primary care on 10/20/2024 by Nooruddin, Saad, MD.  Called Nurse Triage reporting Home health needs wound care orders.  Symptoms began today.  Interventions attempted: Nothing.  Symptoms are: stable.Rhonda with [facility] needs orders for wound care and wound VAC. On call number given.  Triage Disposition: Call PCP Now  Patient/caregiver understands and will follow disposition?: Yes     Copied from CRM #[id number]. Topic: Clinical - Medical Advice >> Nov 19, 2024  1:07 PM Debby BROCKS wrote: Reason for CRM: [facility] has a nurse at the patients house but they need a verbal order for wound care before they can continue. They would like to know if there is an on call doctor that can be contacted to get the order for the patient or else they will have to leave the house without assisting. Reason for Disposition  [1] Caller requests to speak ONLY to PCP AND [2] URGENT question  Answer Assessment - Initial Assessment Questions 1. REASON FOR CALL or QUESTION: What is your reason for calling today? or How can I best     Home health needs wound care orders. 2. CALLER: Document the source of call. (e.g., laboratory staff, caregiver or patient).     Rhonda with [facility]  Protocols used: PCP Call - No Triage-A-AH

## 2024-11-22 ENCOUNTER — Other Ambulatory Visit: Payer: Self-pay | Admitting: Student

## 2024-11-22 ENCOUNTER — Telehealth: Payer: Self-pay | Admitting: *Deleted

## 2024-11-22 DIAGNOSIS — Z89611 Acquired absence of right leg above knee: Secondary | ICD-10-CM

## 2024-11-22 DIAGNOSIS — G546 Phantom limb syndrome with pain: Secondary | ICD-10-CM

## 2024-11-23 ENCOUNTER — Ambulatory Visit: Payer: Self-pay

## 2024-11-23 NOTE — Telephone Encounter (Signed)
 I talked to pt's wife, Erminio. Repeated pt does not have a fever, no difficulty breathing, nor redness; only c/o swelling and itching. Stated pt's HHN came Friday;and his VS'S were ok and will be back tomorrow (For wound are). She's aware for any changes in his condition to go to the ER. Pt's appt is Thursday 12/18 with Dr Harrie

## 2024-11-23 NOTE — Telephone Encounter (Signed)
 FYI Only or Action Required?: FYI only for provider: appointment scheduled on 11/25/24.  Patient was last seen in primary care on 10/20/2024 by Nooruddin, Saad, MD.  Called Nurse Triage reporting Facial Swelling.  Symptoms began a week ago.  Interventions attempted: OTC medications: benadryl  and Ice/heat application.  Symptoms are: unchanged.  Triage Disposition: See PCP When Office is Open (Within 3 Days)  Patient/caregiver understands and will follow disposition?: Yes   Copied from CRM #[id number]. Topic: Clinical - Red Word Triage >> Nov 23, 2024 10:17 AM Chiquita SQUIBB wrote: Red Word that prompted transfer to Nurse Triage: Patients wife is calling in stating that under his eyes about the area of his jaw is swollen on both sides for about a week and it is getting worse, patient states its not painful just swollen and itchy. Reason for Disposition  [1] MILD face swelling (e.g., puffiness) AND [2] persists > 3 days  Answer Assessment - Initial Assessment Questions Pt's wife, Erminio, called in, pt on speaker, stating that patient is experiencing increased facial puffiness. Pt denies pain, changes in vision, fever, oral or lip swelling. Denies any new medications or environmental exposures. Only new tx is a wound vac on buttocks d/t pressure wound with being wheelchair bound. Pt states puffiness started on his R side, from his tear troughs/cheeks then proceeded to his jawline. Pt tried benadryl  and warm compress without relief. Today puffiness is bilaterally with no symptoms except itchiness. Pt reports itching 6/10 on R side side d/t crustiness. Discussed if pt had any drainage from eye but denied, stated all symptoms are on the skin. Discussed importance of trying not to itch to avoid infection or introduction of germs into area. Discussed cold compress and anti-histamines (zyrtec or Claritin) that are non-drowsy. Pt and wife voiced understanding. Appointment scheduled for evaluation. Patient  agrees with plan of care, and will call back if anything changes, or if symptoms worsen.     1. ONSET: When did the swelling start? (e.g., minutes, hours, days)     About a week ago, started on the right side and now bilaterally on cheeks and jaw   2. LOCATION: What part of the face is swollen? (e.g., cheek, entire face, jaw joint area, under jaw)     From tear troughs to jaw line; worse on R side    3. SEVERITY: How swollen is it?     Puffiness; possible 0.5in  4. ITCHING: Is there any itching? If Yes, ask: How much?   (Scale 1-10; mild, moderate or severe)     Yes; 6/10 on R side   5. PAIN: Is the swelling painful to touch? If Yes, ask: How painful is it?   (Scale 0-10; mild, moderate or severe)     None   6. FEVER: Do you have a fever? If Yes, ask: What is it, how was it measured, and when did it start?      None   7. CAUSE: What do you think is causing the face swelling?     Unknown; no new medications or exposures   8. NEW MEDICINES: Have there been any new medicines started recently?     None   9. RECURRENT SYMPTOM: Have you had face swelling before? If Yes, ask: When was the last time? What happened that time?     None   10. OTHER SYMPTOMS: Do you have any other symptoms? (e.g., leg swelling, toothache)       None  Protocols used: Face Swelling-A-AH

## 2024-11-25 ENCOUNTER — Ambulatory Visit: Admitting: Student

## 2024-11-25 ENCOUNTER — Encounter: Payer: Self-pay | Admitting: Student

## 2024-11-25 VITALS — BP 134/83 | HR 79 | Temp 98.1°F | Ht 72.0 in

## 2024-11-25 DIAGNOSIS — G546 Phantom limb syndrome with pain: Secondary | ICD-10-CM

## 2024-11-25 DIAGNOSIS — Z89611 Acquired absence of right leg above knee: Secondary | ICD-10-CM | POA: Diagnosis not present

## 2024-11-25 DIAGNOSIS — R600 Localized edema: Secondary | ICD-10-CM | POA: Insufficient documentation

## 2024-11-25 DIAGNOSIS — R220 Localized swelling, mass and lump, head: Secondary | ICD-10-CM

## 2024-11-25 MED ORDER — TRIAMCINOLONE ACETONIDE 0.1 % EX CREA: TOPICAL_CREAM | Freq: Three times a day (TID) | CUTANEOUS | 2 refills | Status: AC

## 2024-11-25 MED ORDER — GABAPENTIN 400 MG PO CAPS: 400.0000 mg | ORAL_CAPSULE | Freq: Two times a day (BID) | ORAL | 2 refills | Status: DC
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
## 2024-11-25 NOTE — Progress Notes (Signed)
 Internal Medicine Clinic Attending  I was physically present during the key portions of the resident provided service and participated in the medical decision making of patient's management care. I reviewed pertinent patient test results.  The assessment, diagnosis, and plan were formulated together and I agree with the documentation in the resident's note.  Jeanelle Layman CROME, MD

## 2024-11-25 NOTE — Assessment & Plan Note (Signed)
 Refill gabapentin  provided. He has a R AKA that is unchanged since an accident nearly 50 years ago.

## 2024-11-25 NOTE — Assessment & Plan Note (Addendum)
 Periorbital edema ongoing for about 1 week.  Started on the right eye, progressed to the left, but the left is now improving.  Self treatment with Benadryl  and then cetirizine, which has offered a small amount of relief.  Regarding symptoms, there are few.  Endorses only mild tenderness and itching that he attributes to the feeling of swelling.  No pain, no vision changes, no fevers or chills, no malaise or fatigue, no drainage or breakdown of the skin barrier.  No known exposures or changes in medicines, creams, detergents, household pets.  This has never happened before.  There is no edema elsewhere in the body.  Differential includes localized inflammatory/allergic or infectious causes, or systemic diseases such as thyroid , renal, autoimmune disorders.  Feel that cellulitis is unlikely given his relative lack of discomfort and the symmetric pattern. The mouth, lips, airway are not involved and there is no dyspnea or signs of airway compromise. Do not suspect a hereditary or drug-related angioedema given this and the history.  - CBC to consider infection or anemia - TSH to consider fluid retention - Urinalysis to assess for proteinuria - Continue OTC cetirizine - Start triamcinolone  0.1%, precautions provided against long-term use

## 2024-11-26 LAB — CBC
Hematocrit: 45.4 % (ref 37.5–51.0)
Hemoglobin: 14.4 g/dL (ref 13.0–17.7)
MCH: 27.1 pg (ref 26.6–33.0)
MCHC: 31.7 g/dL (ref 31.5–35.7)
MCV: 85 fL (ref 79–97)
Platelets: 319 x10E3/uL (ref 150–450)
RBC: 5.32 x10E6/uL (ref 4.14–5.80)
RDW: 15 % (ref 11.6–15.4)
WBC: 8.3 x10E3/uL (ref 3.4–10.8)

## 2024-11-26 LAB — URINALYSIS, COMPLETE
Bilirubin, UA: NEGATIVE
Glucose, UA: NEGATIVE
Ketones, UA: NEGATIVE
Nitrite, UA: POSITIVE — AB
Specific Gravity, UA: 1.01 (ref 1.005–1.030)
Urobilinogen, Ur: 0.2 mg/dL (ref 0.2–1.0)
pH, UA: 7 (ref 5.0–7.5)

## 2024-11-26 LAB — MICROSCOPIC EXAMINATION
Casts: NONE SEEN /LPF
WBC, UA: >30 /HPF — AB (ref 0–5)

## 2024-11-26 LAB — TSH: TSH: 1.44 u[IU]/mL (ref 0.450–4.500)

## 2024-11-29 ENCOUNTER — Ambulatory Visit: Payer: Self-pay | Admitting: Student

## 2024-12-01 ENCOUNTER — Telehealth: Payer: Self-pay | Admitting: *Deleted

## 2024-12-01 ENCOUNTER — Ambulatory Visit

## 2024-12-01 VITALS — Ht 73.0 in | Wt 180.0 lb

## 2024-12-01 DIAGNOSIS — Z0000 Encounter for general adult medical examination without abnormal findings: Secondary | ICD-10-CM | POA: Diagnosis not present
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
## 2024-12-01 NOTE — Patient Instructions (Signed)
 Mr. Eslick,  Thank you for taking the time for your Medicare Wellness Visit. I appreciate your continued commitment to your health goals. Please review the care plan we discussed, and feel free to reach out if I can assist you further.  Please note that Annual Wellness Visits do not include a physical exam. Some assessments may be limited, especially if the visit was conducted virtually. If needed, we may recommend an in-person follow-up with your provider.  Ongoing Care Seeing your primary care provider every 3 to 6 months helps us  monitor your health and provide consistent, personalized care.   Referrals If a referral was made during today's visit and you haven't received any updates within two weeks, please contact the referred provider directly to check on the status.  Recommended Screenings:  Health Maintenance  Topic Date Due   Zoster (Shingles) Vaccine (1 of 2) Never done   Complete foot exam   03/12/2024   Yearly kidney health urinalysis for diabetes  08/12/2024   Medicare Annual Wellness Visit  10/07/2024   Eye exam for diabetics  12/23/2024   Hemoglobin A1C  03/15/2025   Yearly kidney function blood test for diabetes  09/14/2025   Lipid (cholesterol) test  09/14/2025   Colon Cancer Screening  08/28/2027   DTaP/Tdap/Td vaccine (3 - Td or Tdap) 10/01/2032   Pneumococcal Vaccine for age over 46  Completed   Flu Shot  Completed   Hepatitis C Screening  Completed   Meningitis B Vaccine  Aged Out   COVID-19 Vaccine  Discontinued       12/01/2024   11:54 AM  Advanced Directives  Does Patient Have a Medical Advance Directive? No;Yes  Type of Estate Agent of [place];Living will  Copy of Healthcare Power of Attorney in Chart? No - copy requested  Would patient like information on creating a medical advance directive? No - Patient declined    Vision: Annual vision screenings are recommended for early detection of glaucoma, cataracts, and diabetic  retinopathy. These exams can also reveal signs of chronic conditions such as diabetes and high blood pressure.  Dental: Annual dental screenings help detect early signs of oral cancer, gum disease, and other conditions linked to overall health, including heart disease and diabetes.  Please see the attached documents for additional preventive care recommendations.

## 2024-12-01 NOTE — Telephone Encounter (Signed)
 Received a call from [place] RN with [facility] who stated the wrong supplies were ordered for pt's wound vac. She stated the correct dressing is [place] in [employer]. Stated they will be able to use what they have until the correct dressings arrive.

## 2024-12-13 NOTE — Telephone Encounter (Signed)
 See 12/30 office note - [facility]

## 2024-12-21 ENCOUNTER — Ambulatory Visit: Payer: Self-pay

## 2024-12-21 NOTE — Telephone Encounter (Signed)
 Pt has an appt tomorrow 1/14 with Dr Kem.

## 2024-12-21 NOTE — Telephone Encounter (Signed)
 FYI Only or Action Required?: FYI only for provider: appointment scheduled on 12/22/24.  Patient was last seen in primary care on 11/25/2024 by Harrie Bruckner, DO.  Called Nurse Triage reporting Facial Swelling and Eye Pain.  Symptoms began about a month ago.  Interventions attempted: Prescription medications: Kenalog  cream.  Symptoms are: gradually worsening.  Triage Disposition: See PCP Within 2 Weeks  Patient/caregiver understands and will follow disposition?: Yes                                  1. ONSET: When did the pain start? (e.g., minutes, hours, days)     Ongoing, seen for OV 11/25/24, symptoms improved and worsened again this week 3. SEVERITY: How bad is the pain?  (Scale 1-10; mild, moderate or severe)     Rates pain a 5 at this time, states sometimes his eyes do not bother him 4. LOCATION: Where does it hurt?  (e.g., eyelid, eye, cheekbone)     Bilateral eyes 5. CAUSE: What do you think is causing the pain?     Unsure 6. VISION: Do you have blurred vision or changes in your vision?      Denies changes in vision 7. EYE DISCHARGE: Is there any discharge (pus) from the eye(s)?  If Yes, ask: What color is it?      Clear 8. FEVER: Do you have a fever? If Yes, ask: What is it, how was it measured, and when did it start?      Denies 9. OTHER SYMPTOMS: Do you have any other symptoms? (e.g., headache, nasal discharge, facial rash)     Swelling and redness- patient able to open and close eyes as normal    This RN advised in-person evaluation. Patient scheduled in PCP office for tomorrow afternoon.   Reason for Disposition  MILD eye pains are a recurrent problem  Protocols used: Eye Pain and Other Symptoms-A-AH  Copied from CRM #[id number]. Topic: Clinical - Red Word Triage >> Dec 21, 2024 10:55 AM Mercer PEDLAR wrote: Red Word that prompted transfer to Nurse Triage: Maire [facility], stated patient's  eyes are burning, red and swollen.

## 2024-12-22 ENCOUNTER — Ambulatory Visit: Payer: Self-pay

## 2024-12-22 VITALS — BP 130/77 | HR 63 | Temp 98.1°F | Ht 73.0 in

## 2024-12-22 DIAGNOSIS — R600 Localized edema: Secondary | ICD-10-CM | POA: Diagnosis not present

## 2024-12-22 MED ORDER — CETIRIZINE HCL 10 MG PO TABS: 20.0000 mg | ORAL_TABLET | Freq: Two times a day (BID) | ORAL | 1 refills | Status: AC

## 2024-12-22 NOTE — Patient Instructions (Signed)
 Today we discussed the following medical conditions and plan:   For your itching, I would recommend to stop using the triamcinolone  cream. Lets start using the cetirizine  twice a day. We will check some labs to see any other causes.   We look forward to seeing you next time. Please call our clinic at [phone] if you have any questions or concerns. The best time to call is Monday-Friday from 9am-4pm, but there is someone available 24/7. If you need medication refills, please notify your pharmacy one week in advance and they will send us  a request.   Thank you for trusting me with your care. Wishing you the best!   Shawn Morici D'Mello, DO  [facility]   If things aren't better in a month, come back and we can discuss some further options

## 2024-12-22 NOTE — Progress Notes (Signed)
 Treatment with triamcinolone . Clear drainage. Last week

## 2024-12-22 NOTE — Assessment & Plan Note (Addendum)
 Patient has been using triamcinolone  cream.  He states that this has helped improve the edema but there is still some residual left.  Things have been improving but about a week ago edema started becoming more noticeable in the right orbital field.  Also endorses continued pruritus in this area.  At this point patient has been on steroid cream for about a month.  Discouraged use of this and continuance of cetirizine  but at higher dose.  Also encouraged intake of all household products to see if there are any triggers. No acute concern for orbital cellulitis   Plan: CBC with differential to check for eosinophilia CMP to check for albumin completely rule out nephrotic syndrome Increase cetirizine  to 20 mg twice a day. Discontinue triamcinolone .

## 2024-12-22 NOTE — Progress Notes (Addendum)
 "  Established Patient Office Visit  Subjective   Patient ID: Shawn [name]., male    DOB: 02/20/1951  Age: 74 y.o. MRN: [id number]  Chief Complaint  Patient presents with   Follow-up    HPI Past medical history of tobacco use, right AKA, paraplegic spinal paralysis, hypertension, diabetes mellitus, chronic osteomyelitis involving pelvic region and thigh that presents today for follow-up of periorbital edema and pruritus.  See problem-based assessment and plan for more details.   ROS See problem-based assessment and plan for details.   Objective:     BP 130/77 (BP Location: Right Arm, Patient Position: Sitting, Cuff Size: Normal)   Pulse 63   Temp 98.1 F (36.7 C) (Oral)   Ht 6' 1 (1.854 m)   SpO2 100%   BMI 23.75 kg/m  BP Readings from Last 3 Encounters:  12/22/24 130/77  11/25/24 134/83  10/20/24 135/85   Wt Readings from Last 3 Encounters:  12/01/24 180 lb (81.6 kg)  10/08/23 185 lb (83.9 kg)  06/04/23 205 lb (93 kg)      Physical Exam Constitutional:      General: He is not in acute distress.    Appearance: He is not toxic-appearing.  HENT:     Mouth/Throat:     Mouth: Mucous membranes are moist.  Eyes:     General:        Right eye: No discharge.        Left eye: No discharge.     Conjunctiva/sclera: Conjunctivae normal.     Comments: Noticeable swelling under right eye.  Area of edema noted on right lateral portion of eyelid.  No tenderness to palpation in the periorbital area.  Also some erythema noted around left periorbital area.  Improved from previous visit.  See picture in the chart. No pain with extraocular movements   Musculoskeletal:     Comments: Right AKA   Neurological:     Mental Status: He is alert and oriented to person, place, and time.      No results found for any visits on 12/22/24.  Last CBC Lab Results  Component Value Date   WBC 8.3 11/25/2024   HGB 14.4 11/25/2024   HCT 45.4 11/25/2024   MCV 85 11/25/2024   MCH  27.1 11/25/2024   RDW 15.0 11/25/2024   PLT 319 11/25/2024   Last metabolic panel Lab Results  Component Value Date   GLUCOSE 91 09/14/2024   NA 137 09/14/2024   K 4.7 09/14/2024   CL 102 09/14/2024   CO2 20 09/14/2024   BUN 14 09/14/2024   CREATININE 0.80 09/14/2024   EGFR 93 09/14/2024   CALCIUM  8.8 09/14/2024   PHOS 3.9 10/11/2020   PROT 6.0 (L) 11/27/2020   ALBUMIN 2.4 (L) 11/27/2020   BILITOT 0.4 11/27/2020   ALKPHOS 56 11/27/2020   AST 11 (L) 11/27/2020   ALT 11 11/27/2020   ANIONGAP 9 01/08/2021   Last hemoglobin A1c Lab Results  Component Value Date   HGBA1C 6.4 (H) 09/14/2024      The ASCVD Risk score (Arnett DK, et al., 2019) failed to calculate for the following reasons:   The valid total cholesterol range is 130 to 320 mg/dL    Assessment & Plan:   Problem List Items Addressed This Visit       Other   Periorbital edema of both eyes - Primary   Patient has been using triamcinolone  cream.  He states that this has helped improve the edema  but there is still some residual left.  Things have been improving but about a week ago edema started becoming more noticeable in the right orbital field.  Also endorses continued pruritus in this area.  At this point patient has been on steroid cream for about a month.  Discouraged use of this and continuance of cetirizine  but at higher dose.  Also encouraged intake of all household products to see if there are any triggers.  Plan: CBC with differential to check for eosinophilia CMP to check for albumin completely rule out nephrotic syndrome Increase cetirizine  to 20 mg twice a day. Discontinue triamcinolone .      Relevant Orders   Comprehensive metabolic panel with GFR   CBC with Diff    Return in about 4 weeks (around 01/19/2025).    Avonlea Sima D'Mello, DO Patient seen and discussed with Dr. Rosan "

## 2024-12-23 ENCOUNTER — Ambulatory Visit: Payer: Self-pay

## 2024-12-23 LAB — CBC WITH DIFFERENTIAL/PLATELET
Basophils Absolute: 0.1 x10E3/uL (ref 0.0–0.2)
Basos: 1 %
EOS (ABSOLUTE): 0.2 x10E3/uL (ref 0.0–0.4)
Eos: 3 %
Hematocrit: 42.5 % (ref 37.5–51.0)
Hemoglobin: 13.6 g/dL (ref 13.0–17.7)
Immature Grans (Abs): 0 x10E3/uL (ref 0.0–0.1)
Immature Granulocytes: 0 %
Lymphocytes Absolute: 1.1 x10E3/uL (ref 0.7–3.1)
Lymphs: 15 %
MCH: 26.8 pg (ref 26.6–33.0)
MCHC: 32 g/dL (ref 31.5–35.7)
MCV: 84 fL (ref 79–97)
Monocytes Absolute: 0.8 x10E3/uL (ref 0.1–0.9)
Monocytes: 11 %
Neutrophils Absolute: 5.2 x10E3/uL (ref 1.4–7.0)
Neutrophils: 70 %
Platelets: 313 x10E3/uL (ref 150–450)
RBC: 5.07 x10E6/uL (ref 4.14–5.80)
RDW: 15.1 % (ref 11.6–15.4)
WBC: 7.4 x10E3/uL (ref 3.4–10.8)

## 2024-12-23 LAB — COMPREHENSIVE METABOLIC PANEL WITH GFR
ALT: 5 IU/L (ref 0–44)
AST: 8 IU/L (ref 0–40)
Albumin: 3.7 g/dL — ABNORMAL LOW (ref 3.8–4.8)
Alkaline Phosphatase: 87 IU/L (ref 47–123)
BUN/Creatinine Ratio: 18 (ref 10–24)
BUN: 14 mg/dL (ref 8–27)
Bilirubin Total: 0.4 mg/dL (ref 0.0–1.2)
CO2: 19 mmol/L — ABNORMAL LOW (ref 20–29)
Calcium: 8.3 mg/dL — ABNORMAL LOW (ref 8.6–10.2)
Chloride: 103 mmol/L (ref 96–106)
Creatinine, Ser: 0.77 mg/dL (ref 0.76–1.27)
Globulin, Total: 2.8 g/dL (ref 1.5–4.5)
Glucose: 86 mg/dL (ref 70–99)
Potassium: 4.7 mmol/L (ref 3.5–5.2)
Sodium: 135 mmol/L (ref 134–144)
Total Protein: 6.5 g/dL (ref 6.0–8.5)
eGFR: 93 mL/min/1.73

## 2024-12-31 NOTE — Progress Notes (Signed)
 Internal Medicine Clinic Attending  I was physically present during the key portions of the resident provided service and participated in the medical decision making of patient's management care. I reviewed pertinent patient test results.  The assessment, diagnosis, and plan were formulated together and I agree with the documentation in the resident's note.  Rosan Dayton BROCKS, DO

## 2025-01-21 ENCOUNTER — Ambulatory Visit: Payer: Self-pay | Admitting: Student
